# Patient Record
Sex: Female | Born: 1935 | Race: White | Hispanic: No | Smoking: Former smoker
Health system: Southern US, Community
[De-identification: ages and names within clinical notes are randomized; demographics above are authoritative.]

## PROBLEM LIST (undated history)

## (undated) DIAGNOSIS — M7989 Other specified soft tissue disorders: Secondary | ICD-10-CM

## (undated) DIAGNOSIS — E1142 Type 2 diabetes mellitus with diabetic polyneuropathy: Secondary | ICD-10-CM

## (undated) DIAGNOSIS — E7800 Pure hypercholesterolemia, unspecified: Secondary | ICD-10-CM

## (undated) DIAGNOSIS — F0390 Unspecified dementia without behavioral disturbance: Secondary | ICD-10-CM

## (undated) DIAGNOSIS — R634 Abnormal weight loss: Secondary | ICD-10-CM

## (undated) DIAGNOSIS — R0602 Shortness of breath: Secondary | ICD-10-CM

## (undated) DIAGNOSIS — E785 Hyperlipidemia, unspecified: Secondary | ICD-10-CM

## (undated) DIAGNOSIS — R062 Wheezing: Secondary | ICD-10-CM

## (undated) DIAGNOSIS — H269 Unspecified cataract: Secondary | ICD-10-CM

## (undated) DIAGNOSIS — F419 Anxiety disorder, unspecified: Secondary | ICD-10-CM

## (undated) DIAGNOSIS — M1990 Unspecified osteoarthritis, unspecified site: Secondary | ICD-10-CM

## (undated) DIAGNOSIS — K219 Gastro-esophageal reflux disease without esophagitis: Secondary | ICD-10-CM

## (undated) DIAGNOSIS — R12 Heartburn: Secondary | ICD-10-CM

## (undated) DIAGNOSIS — F329 Major depressive disorder, single episode, unspecified: Secondary | ICD-10-CM

## (undated) DIAGNOSIS — I10 Essential (primary) hypertension: Secondary | ICD-10-CM

## (undated) DIAGNOSIS — F32A Depression, unspecified: Secondary | ICD-10-CM

## (undated) DIAGNOSIS — T4145XA Adverse effect of unspecified anesthetic, initial encounter: Secondary | ICD-10-CM

## (undated) DIAGNOSIS — M797 Fibromyalgia: Secondary | ICD-10-CM

## (undated) HISTORY — DX: Depression, unspecified: F32.A

## (undated) HISTORY — PX: ABDOMINAL HYSTERECTOMY: SHX81

## (undated) HISTORY — DX: Other specified soft tissue disorders: M79.89

## (undated) HISTORY — DX: Wheezing: R06.2

## (undated) HISTORY — PX: OTHER SURGICAL HISTORY: SHX169

## (undated) HISTORY — DX: Abnormal weight loss: R63.4

## (undated) HISTORY — DX: Unspecified cataract: H26.9

---
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
## 2002-05-19 ENCOUNTER — Encounter: Payer: Self-pay | Admitting: Family Medicine

## 2002-05-19 ENCOUNTER — Ambulatory Visit (HOSPITAL_COMMUNITY): Admission: RE | Admit: 2002-05-19 | Discharge: 2002-05-19 | Payer: Self-pay | Admitting: Family Medicine

## 2002-08-02 ENCOUNTER — Encounter: Payer: Self-pay | Admitting: Family Medicine

## 2002-08-02 ENCOUNTER — Ambulatory Visit (HOSPITAL_COMMUNITY): Admission: RE | Admit: 2002-08-02 | Discharge: 2002-08-02 | Payer: Self-pay | Admitting: Family Medicine

## 2002-08-27 ENCOUNTER — Encounter: Payer: Self-pay | Admitting: Family Medicine

## 2002-08-27 ENCOUNTER — Ambulatory Visit (HOSPITAL_COMMUNITY): Admission: RE | Admit: 2002-08-27 | Discharge: 2002-08-27 | Payer: Self-pay | Admitting: Family Medicine

## 2003-02-06 ENCOUNTER — Emergency Department (HOSPITAL_COMMUNITY): Admission: EM | Admit: 2003-02-06 | Discharge: 2003-02-06 | Payer: Self-pay | Admitting: Emergency Medicine

## 2003-04-17 ENCOUNTER — Encounter: Payer: Self-pay | Admitting: *Deleted

## 2003-04-17 ENCOUNTER — Emergency Department (HOSPITAL_COMMUNITY): Admission: EM | Admit: 2003-04-17 | Discharge: 2003-04-17 | Payer: Self-pay | Admitting: *Deleted

## 2005-12-24 ENCOUNTER — Emergency Department (HOSPITAL_COMMUNITY): Admission: EM | Admit: 2005-12-24 | Discharge: 2005-12-24 | Payer: Self-pay | Admitting: Emergency Medicine

## 2007-03-20 ENCOUNTER — Emergency Department (HOSPITAL_COMMUNITY): Admission: EM | Admit: 2007-03-20 | Discharge: 2007-03-20 | Payer: Self-pay | Admitting: Emergency Medicine

## 2007-10-12 ENCOUNTER — Ambulatory Visit (HOSPITAL_COMMUNITY): Admission: RE | Admit: 2007-10-12 | Discharge: 2007-10-12 | Payer: Self-pay | Admitting: Family Medicine

## 2008-06-16 ENCOUNTER — Emergency Department (HOSPITAL_COMMUNITY): Admission: EM | Admit: 2008-06-16 | Discharge: 2008-06-16 | Payer: Self-pay | Admitting: Emergency Medicine

## 2010-06-07 ENCOUNTER — Ambulatory Visit (HOSPITAL_COMMUNITY): Admission: RE | Admit: 2010-06-07 | Discharge: 2010-06-07 | Payer: Self-pay | Admitting: Internal Medicine

## 2011-06-17 LAB — BASIC METABOLIC PANEL
BUN: 13
Calcium: 9
GFR calc non Af Amer: >60
Glucose, Bld: 136 — ABNORMAL HIGH
Potassium: 3.3 — ABNORMAL LOW
Sodium: 140

## 2011-06-17 LAB — CBC
HCT: 38.9
Hemoglobin: 13.3
Platelets: 231
RDW: 13.7
WBC: 7.2

## 2011-06-17 LAB — DIFFERENTIAL
Basophils Absolute: 0
Lymphocytes Relative: 24
Lymphs Abs: 1.8
Neutro Abs: 5

## 2011-06-17 LAB — URINALYSIS, ROUTINE W REFLEX MICROSCOPIC
Glucose, UA: NEGATIVE
Hgb urine dipstick: NEGATIVE
Leukocytes, UA: NEGATIVE
Protein, ur: 100 — AB
pH: 6

## 2011-06-17 LAB — URINE MICROSCOPIC-ADD ON

## 2011-06-27 ENCOUNTER — Other Ambulatory Visit (HOSPITAL_COMMUNITY): Payer: Self-pay | Admitting: Internal Medicine

## 2011-06-27 DIAGNOSIS — Z139 Encounter for screening, unspecified: Secondary | ICD-10-CM

## 2011-07-02 ENCOUNTER — Ambulatory Visit (HOSPITAL_COMMUNITY): Payer: Self-pay

## 2011-07-08 ENCOUNTER — Ambulatory Visit (HOSPITAL_COMMUNITY): Payer: Medicare Other

## 2011-07-30 ENCOUNTER — Ambulatory Visit (HOSPITAL_COMMUNITY)
Admission: RE | Admit: 2011-07-30 | Discharge: 2011-07-30 | Disposition: A | Discharge status: Home / Self Care | Payer: Medicare Other | Source: Ambulatory Visit | Attending: Internal Medicine | Admitting: Internal Medicine

## 2011-07-30 DIAGNOSIS — Z1231 Encounter for screening mammogram for malignant neoplasm of breast: Secondary | ICD-10-CM | POA: Insufficient documentation

## 2011-07-30 DIAGNOSIS — Z139 Encounter for screening, unspecified: Secondary | ICD-10-CM

## 2011-11-27 ENCOUNTER — Encounter (HOSPITAL_COMMUNITY): Payer: Self-pay | Admitting: Pharmacy Technician

## 2011-12-03 NOTE — Patient Instructions (Signed)
22 Laurie Mcfarland  12/03/2011   Your procedure is scheduled on:   4/9/ 2013  Report to [facility] at  830  AM.  Call this number if you have problems the morning of surgery: [phone]   Remember:   Do not eat food:After Midnight.  May have clear liquids:until Midnight .  Clear liquids include soda, tea, black coffee, apple or grape juice, broth.  Take these medicines the morning of surgery with A SIP OF WATER:  Xanax,norvasc,neurontin,prilosec   Do not wear jewelry, make-up or nail polish.  Do not wear lotions, powders, or perfumes. You may wear deodorant.  Do not shave 48 hours prior to surgery.  Do not bring valuables to the hospital.  Contacts, dentures or bridgework may not be worn into surgery.  Leave suitcase in the car. After surgery it may be brought to your room.  For patients admitted to the hospital, checkout time is 11:00 AM the day of discharge.   Patients discharged the day of surgery will not be allowed to drive home.  Name and phone number of your driver: family  Special Instructions: N/A   Please read over the following fact sheets that you were given: Pain Booklet, Surgical Site Infection Prevention, Anesthesia Post-op Instructions and Care and Recovery After Surgery Cataract A cataract is a clouding of the lens of the eye. When a lens becomes cloudy, vision is reduced based on the degree and nature of the clouding. Many cataracts reduce vision to some degree. Some cataracts make people more near-sighted as they develop. Other cataracts increase glare. Cataracts that are ignored and become worse can sometimes look white. The white color can be seen through the pupil. CAUSES   Aging. However, cataracts may occur at any age, even in newborns.   Certain drugs.   Trauma to the eye.   Certain diseases such as diabetes.   Specific eye diseases such as chronic inflammation inside the eye or a sudden attack of a rare form of glaucoma.   Inherited or acquired medical  problems.  SYMPTOMS   Gradual, progressive drop in vision in the affected eye.   Severe, rapid visual loss. This most often happens when trauma is the cause.  DIAGNOSIS  To detect a cataract, an eye doctor examines the lens. Cataracts are best diagnosed with an exam of the eyes with the pupils enlarged (dilated) by drops.  TREATMENT  For an early cataract, vision may improve by using different eyeglasses or stronger lighting. If that does not help your vision, surgery is the only effective treatment. A cataract needs to be surgically removed when vision loss interferes with your everyday activities, such as driving, reading, or watching TV. A cataract may also have to be removed if it prevents examination or treatment of another eye problem. Surgery removes the cloudy lens and usually replaces it with a substitute lens (intraocular lens, IOL).  At a time when both you and your doctor agree, the cataract will be surgically removed. If you have cataracts in both eyes, only one is usually removed at a time. This allows the operated eye to heal and be out of danger from any possible problems after surgery (such as infection or poor wound healing). In rare cases, a cataract may be doing damage to your eye. In these cases, your caregiver may advise surgical removal right away. The vast majority of people who have cataract surgery have better vision afterward. HOME CARE INSTRUCTIONS  If you are not planning surgery, you  may be asked to do the following:  Use different eyeglasses.   Use stronger or brighter lighting.   Ask your eye doctor about reducing your medicine dose or changing medicines if it is thought that a medicine caused your cataract. Changing medicines does not make the cataract go away on its own.   Become familiar with your surroundings. Poor vision can lead to injury. Avoid bumping into things on the affected side. You are at a higher risk for tripping or falling.   Exercise extreme  care when driving or operating machinery.   Wear sunglasses if you are sensitive to bright light or experiencing problems with glare.  SEEK IMMEDIATE MEDICAL CARE IF:   You have a worsening or sudden vision loss.   You notice redness, swelling, or increasing pain in the eye.   You have a fever.  Document Released: 08/19/2005 Document Revised: 08/08/2011 Document Reviewed: 04/12/2011 [facility] Patient Information 2012 [place], [place].PATIENT INSTRUCTIONS POST-ANESTHESIA  IMMEDIATELY FOLLOWING SURGERY:  Do not drive or operate machinery for the first twenty four hours after surgery.  Do not make any important decisions for twenty four hours after surgery or while taking narcotic pain medications or sedatives.  If you develop intractable nausea and vomiting or a severe headache please notify your doctor immediately.  FOLLOW-UP:  Please make an appointment with your surgeon as instructed. You do not need to follow up with anesthesia unless specifically instructed to do so.  WOUND CARE INSTRUCTIONS (if applicable):  Keep a dry clean dressing on the anesthesia/puncture wound site if there is drainage.  Once the wound has quit draining you may leave it open to air.  Generally you should leave the bandage intact for twenty four hours unless there is drainage.  If the epidural site drains for more than 36-48 hours please call the anesthesia department.  QUESTIONS?:  Please feel free to call your physician or the hospital operator if you have any questions, and they will be happy to assist you.     [facility] Anesthesia Department [address] [place] [phone]

## 2011-12-04 ENCOUNTER — Encounter (HOSPITAL_COMMUNITY): Payer: Self-pay

## 2011-12-04 ENCOUNTER — Other Ambulatory Visit: Payer: Self-pay

## 2011-12-04 ENCOUNTER — Encounter (HOSPITAL_COMMUNITY)
Admission: RE | Admit: 2011-12-04 | Discharge: 2011-12-04 | Disposition: A | Discharge status: Home / Self Care | Payer: Medicare Other | Source: Ambulatory Visit | Attending: Ophthalmology | Admitting: Ophthalmology

## 2011-12-04 HISTORY — DX: Unspecified osteoarthritis, unspecified site: M19.90

## 2011-12-04 HISTORY — DX: Shortness of breath: R06.02

## 2011-12-04 HISTORY — DX: Essential (primary) hypertension: I10

## 2011-12-04 HISTORY — DX: Gastro-esophageal reflux disease without esophagitis: K21.9

## 2011-12-04 HISTORY — DX: Hyperlipidemia, unspecified: E78.5

## 2011-12-04 HISTORY — DX: Adverse effect of unspecified anesthetic, initial encounter: T41.45XA

## 2011-12-04 HISTORY — DX: Fibromyalgia: M79.7

## 2011-12-04 HISTORY — DX: Anxiety disorder, unspecified: F41.9

## 2011-12-04 LAB — HEMOGLOBIN AND HEMATOCRIT, BLOOD
HCT: 38.2 % (ref 36.0–46.0)
Hemoglobin: 12.3 g/dL (ref 12.0–15.0)

## 2011-12-04 LAB — BASIC METABOLIC PANEL
Chloride: 100 mEq/L (ref 96–112)
GFR calc Af Amer: 79 mL/min — ABNORMAL LOW (ref >90)
GFR calc non Af Amer: 68 mL/min — ABNORMAL LOW (ref >90)
Potassium: 4.9 mEq/L (ref 3.5–5.1)
Sodium: 140 mEq/L (ref 135–145)

## 2011-12-09 MED ORDER — FLURBIPROFEN SODIUM 0.03 % OP SOLN
OPHTHALMIC | Status: AC
  Administered 2011-12-10: 1 [drp] via OPHTHALMIC
  Filled 2011-12-09: qty 2.5

## 2011-12-09 MED ORDER — CYCLOPENTOLATE-PHENYLEPHRINE 0.2-1 % OP SOLN
OPHTHALMIC | Status: AC
  Administered 2011-12-10: 1 [drp] via OPHTHALMIC
  Filled 2011-12-09: qty 2

## 2011-12-09 MED ORDER — KETOROLAC TROMETHAMINE 0.5 % OP SOLN: 1.0000 [drp] | OPHTHALMIC | Status: DC

## 2011-12-09 MED ORDER — PHENYLEPHRINE HCL 2.5 % OP SOLN
OPHTHALMIC | Status: AC
  Administered 2011-12-10: 1 [drp] via OPHTHALMIC
  Filled 2011-12-09: qty 2

## 2011-12-09 MED ORDER — TETRACAINE HCL 0.5 % OP SOLN
OPHTHALMIC | Status: AC
  Administered 2011-12-10: 1 [drp] via OPHTHALMIC
  Filled 2011-12-09: qty 2

## 2011-12-10 ENCOUNTER — Ambulatory Visit (HOSPITAL_COMMUNITY): Payer: Medicare Other | Admitting: Anesthesiology

## 2011-12-10 ENCOUNTER — Ambulatory Visit (HOSPITAL_COMMUNITY)
Admission: RE | Admit: 2011-12-10 | Discharge: 2011-12-10 | Disposition: A | Discharge status: Home / Self Care | Payer: Medicare Other | Source: Ambulatory Visit | Attending: Ophthalmology | Admitting: Ophthalmology

## 2011-12-10 ENCOUNTER — Encounter (HOSPITAL_COMMUNITY): Payer: Self-pay | Admitting: Anesthesiology

## 2011-12-10 ENCOUNTER — Encounter (HOSPITAL_COMMUNITY): Payer: Self-pay | Admitting: *Deleted

## 2011-12-10 ENCOUNTER — Encounter (HOSPITAL_COMMUNITY)
Admission: RE | Disposition: A | Discharge status: Home / Self Care | Payer: Self-pay | Source: Ambulatory Visit | Attending: Ophthalmology

## 2011-12-10 DIAGNOSIS — Z01810 Encounter for preprocedural cardiovascular examination: Secondary | ICD-10-CM | POA: Insufficient documentation

## 2011-12-10 DIAGNOSIS — E119 Type 2 diabetes mellitus without complications: Secondary | ICD-10-CM | POA: Insufficient documentation

## 2011-12-10 DIAGNOSIS — Z01812 Encounter for preprocedural laboratory examination: Secondary | ICD-10-CM | POA: Insufficient documentation

## 2011-12-10 DIAGNOSIS — H2510 Age-related nuclear cataract, unspecified eye: Secondary | ICD-10-CM | POA: Insufficient documentation

## 2011-12-10 DIAGNOSIS — Z79899 Other long term (current) drug therapy: Secondary | ICD-10-CM | POA: Insufficient documentation

## 2011-12-10 DIAGNOSIS — I10 Essential (primary) hypertension: Secondary | ICD-10-CM | POA: Insufficient documentation

## 2011-12-10 HISTORY — PX: CATARACT EXTRACTION W/PHACO: SHX586

## 2011-12-10 LAB — GLUCOSE, CAPILLARY: Glucose-Capillary: 119 mg/dL — ABNORMAL HIGH (ref 70–99)

## 2011-12-10 SURGERY — PHACOEMULSIFICATION, CATARACT, WITH IOL INSERTION
Anesthesia: Monitor Anesthesia Care | Site: Eye | Laterality: Left | Wound class: Clean

## 2011-12-10 MED ORDER — LACTATED RINGERS IV SOLN
INTRAVENOUS | Status: DC
  Administered 2011-12-10: 09:00:00 via INTRAVENOUS

## 2011-12-10 MED ORDER — TETRACAINE 0.5 % OP SOLN OPTIME - NO CHARGE
OPHTHALMIC | Status: DC | PRN
  Administered 2011-12-10: 1 [drp] via OPHTHALMIC

## 2011-12-10 MED ORDER — FENTANYL CITRATE 0.05 MG/ML IJ SOLN: 25.0000 ug | INTRAMUSCULAR | Status: DC | PRN

## 2011-12-10 MED ORDER — MIDAZOLAM HCL 2 MG/2ML IJ SOLN
1.0000 mg | INTRAMUSCULAR | Status: DC | PRN
  Administered 2011-12-10: 1 mg via INTRAVENOUS

## 2011-12-10 MED ORDER — TETRACAINE HCL 0.5 % OP SOLN
1.0000 [drp] | OPHTHALMIC | Status: AC
  Administered 2011-12-10 (×3): 1 [drp] via OPHTHALMIC

## 2011-12-10 MED ORDER — PHENYLEPHRINE HCL 2.5 % OP SOLN
1.0000 [drp] | OPHTHALMIC | Status: AC
  Administered 2011-12-10 (×3): 1 [drp] via OPHTHALMIC

## 2011-12-10 MED ORDER — EPINEPHRINE HCL 1 MG/ML IJ SOLN
INTRAMUSCULAR | Status: AC
  Filled 2011-12-10: qty 1

## 2011-12-10 MED ORDER — CYCLOPENTOLATE-PHENYLEPHRINE 0.2-1 % OP SOLN
1.0000 [drp] | OPHTHALMIC | Status: AC
  Administered 2011-12-10 (×3): 1 [drp] via OPHTHALMIC

## 2011-12-10 MED ORDER — FLURBIPROFEN SODIUM 0.03 % OP SOLN
1.0000 [drp] | OPHTHALMIC | Status: AC
  Administered 2011-12-10 (×3): 1 [drp] via OPHTHALMIC

## 2011-12-10 MED ORDER — ONDANSETRON HCL 4 MG/2ML IJ SOLN: 4.0000 mg | Freq: Once | INTRAMUSCULAR | Status: DC | PRN

## 2011-12-10 MED ORDER — EPINEPHRINE HCL 1 MG/ML IJ SOLN
INTRAOCULAR | Status: DC | PRN
  Administered 2011-12-10: 10:00:00

## 2011-12-10 MED ORDER — MIDAZOLAM HCL 2 MG/2ML IJ SOLN
INTRAMUSCULAR | Status: AC
  Administered 2011-12-10: 1 mg via INTRAVENOUS
  Filled 2011-12-10: qty 2

## 2011-12-10 MED ORDER — BSS IO SOLN
INTRAOCULAR | Status: DC | PRN
  Administered 2011-12-10: 15 mL via INTRAOCULAR

## 2011-12-10 MED ORDER — PROVISC 10 MG/ML IO SOLN
INTRAOCULAR | Status: DC | PRN
  Administered 2011-12-10: 8.5 mg via INTRAOCULAR

## 2011-12-10 SURGICAL SUPPLY — 24 items
CAPSULAR TENSION RING-AMO (OPHTHALMIC RELATED) IMPLANT
CLOTH BEACON ORANGE TIMEOUT ST (SAFETY) ×2 IMPLANT
EYE SHIELD UNIVERSAL CLEAR (GAUZE/BANDAGES/DRESSINGS) ×2 IMPLANT
GLOVE BIO SURGEON STRL SZ 6.5 (GLOVE) ×2 IMPLANT
GLOVE BIOGEL M 6.5 STRL (GLOVE) IMPLANT
GLOVE ECLIPSE 6.5 STRL STRAW (GLOVE) IMPLANT
GLOVE ECLIPSE 7.0 STRL STRAW (GLOVE) IMPLANT
GLOVE EXAM NITRILE LRG STRL (GLOVE) IMPLANT
GLOVE EXAM NITRILE MD LF STRL (GLOVE) ×2 IMPLANT
GLOVE SKINSENSE NS SZ6.5 (GLOVE)
GLOVE SKINSENSE STRL SZ6.5 (GLOVE) IMPLANT
HEALON 5 0.6 ML (INTRAOCULAR LENS) IMPLANT
KIT VITRECTOMY (OPHTHALMIC RELATED) IMPLANT
PAD ARMBOARD 7.5X6 YLW CONV (MISCELLANEOUS) ×2 IMPLANT
PROC W NO LENS (INTRAOCULAR LENS)
PROC W SPEC LENS (INTRAOCULAR LENS)
PROCESS W NO LENS (INTRAOCULAR LENS) IMPLANT
PROCESS W SPEC LENS (INTRAOCULAR LENS) IMPLANT
RING MALYGIN (MISCELLANEOUS) IMPLANT
SIGHTPATH CAT PROC W REG LENS (Ophthalmic Related) ×2 IMPLANT
TAPE SURG TRANSPORE 1 IN (GAUZE/BANDAGES/DRESSINGS) ×1 IMPLANT
TAPE SURGICAL TRANSPORE 1 IN (GAUZE/BANDAGES/DRESSINGS) ×1
VISCOELASTIC ADDITIONAL (OPHTHALMIC RELATED) IMPLANT
WATER STERILE IRR 250ML POUR (IV SOLUTION) ×2 IMPLANT

## 2011-12-10 NOTE — Op Note (Signed)
Patient brought to the operating room and prepped and draped in the usual manner.  Lid speculum inserted in left eye.  Stab incision made at the twelve o'clock position.  Provisc instilled in the anterior chamber.   A 2.4 mm. Stab incision was made temporally.  An anterior capsulotomy was done with a bent 25 gauge needle.  The nucleus was hydrodissected.  The Phaco tip was inserted in the anterior chamber and the nucleus was emulsified.  CDE was 10.72.  The cortical material was then removed with the I and A tip.  Posterior capsule was the polished.  The anterior chamber was deepened with Provisc.  A 22.5 Diopter Rayner 570C IOL was then inserted in the capsular bag.  Provisc was then removed with the I and A tip.  The wound was then hydrated.  Patient sent to the Recovery Room in good condition with follow up in my office.

## 2011-12-10 NOTE — Discharge Instructions (Signed)
Laurie Mcfarland  12/10/2011           Mollie Germany Care Instructions [address] [place] [address]      1. Avoid closing eyes tightly. One often closes the eye tightly when laughing, talking, sneezing, coughing or if they feel irritated. At these times, you should be careful not to close your eyes tightly.  2. Instill one drop Nevanac in operative eye 3 times each day until the bottle is finished. To instill drops in your eye, open it, look up and have someone gently pull the lower lid down and instill a couple of drops inside the lower lid.  3. Do not touch upper lid.  4. Take Advil or Tylenol for pain.  5. You may use either eye for near work, such as reading or sewing and you may watch television.  6. You may have your hair done at the beauty parlor at any time.  7. Wear dark glasses with or without your own glasses if you are in bright light.  8. Call our office at [phone] or [phone] if you have sharp pain in your eye or unusual symptoms.  9. Do not be concerned because vision in the operative eye is not good. It will not be good, no matter how successful the operation, until you get a special lens for it. Your old glasses will not be suited to the new eye that was operated on and you will not be ready for a new lens for about a month.  10. Follow up at the [facility] office.    I have received a copy of the above instructions and will follow them.

## 2011-12-10 NOTE — H&P (Signed)
The patient was re examined and there is no change in the patients condition since the original H and P. 

## 2011-12-10 NOTE — Transfer of Care (Signed)
Immediate Anesthesia Transfer of Care Note  Patient: Laurie Mcfarland  Procedure(s) Performed: Procedure(s) (LRB): CATARACT EXTRACTION PHACO AND INTRAOCULAR LENS PLACEMENT (IOC) (Left)  Patient Location: PACU and Short Stay  Anesthesia Type: MAC  Level of Consciousness: awake, alert  and oriented  Airway & Oxygen Therapy: Patient Spontanous Breathing  Post-op Assessment: Report given to PACU RN  Post vital signs: Reviewed and stable  Complications: No apparent anesthesia complications

## 2011-12-10 NOTE — Anesthesia Preprocedure Evaluation (Signed)
Anesthesia Evaluation  Patient identified by MRN, date of birth, ID band Patient awake    Reviewed: Allergy & Precautions, H&P , NPO status   Airway Mallampati: II      Dental  (+) Edentulous Upper   Pulmonary shortness of breath and with exertion,  breath sounds clear to auscultation        Cardiovascular hypertension, Pt. on medications Rhythm:Regular     Neuro/Psych    GI/Hepatic GERD-  Medicated and Controlled,  Endo/Other  Diabetes mellitus-, Well Controlled, Type 2, Oral Hypoglycemic AgentsMorbid obesity  Renal/GU      Musculoskeletal   Abdominal   Peds  Hematology   Anesthesia Other Findings   Reproductive/Obstetrics                           Anesthesia Physical Anesthesia Plan  ASA: III  Anesthesia Plan: MAC   Post-op Pain Management:    Induction: Intravenous  Airway Management Planned: Nasal Cannula  Additional Equipment:   Intra-op Plan:   Post-operative Plan:   Informed Consent: I have reviewed the patients History and Physical, chart, labs and discussed the procedure including the risks, benefits and alternatives for the proposed anesthesia with the patient or authorized representative who has indicated his/her understanding and acceptance.     Plan Discussed with:   Anesthesia Plan Comments:         Anesthesia Quick Evaluation  

## 2011-12-10 NOTE — Anesthesia Postprocedure Evaluation (Signed)
  Anesthesia Post-op Note  Patient: Laurie Mcfarland  Procedure(s) Performed: Procedure(s) (LRB): CATARACT EXTRACTION PHACO AND INTRAOCULAR LENS PLACEMENT (IOC) (Left)  Patient Location: PACU and Short Stay  Anesthesia Type: MAC  Level of Consciousness: awake, alert  and oriented  Airway and Oxygen Therapy: Patient Spontanous Breathing  Post-op Pain: none  Post-op Assessment: Post-op Vital signs reviewed, Patient's Cardiovascular Status Stable, Respiratory Function Stable, Patent Airway and No signs of Nausea or vomiting  Post-op Vital Signs: Reviewed and stable  Complications: No apparent anesthesia complications

## 2011-12-12 ENCOUNTER — Encounter (HOSPITAL_COMMUNITY): Payer: Self-pay | Admitting: Ophthalmology

## 2012-01-07 ENCOUNTER — Encounter (HOSPITAL_COMMUNITY): Payer: Self-pay | Admitting: Pharmacy Technician

## 2012-01-08 ENCOUNTER — Encounter (HOSPITAL_COMMUNITY): Payer: Self-pay

## 2012-01-08 ENCOUNTER — Encounter (HOSPITAL_COMMUNITY)
Admission: RE | Admit: 2012-01-08 | Discharge: 2012-01-08 | Payer: Medicare Other | Source: Ambulatory Visit | Admitting: Ophthalmology

## 2012-01-13 MED ORDER — PHENYLEPHRINE HCL 2.5 % OP SOLN
OPHTHALMIC | Status: AC
  Administered 2012-01-14: 1 [drp] via OPHTHALMIC
  Filled 2012-01-13: qty 2

## 2012-01-13 MED ORDER — CYCLOPENTOLATE-PHENYLEPHRINE 0.2-1 % OP SOLN
OPHTHALMIC | Status: AC
  Administered 2012-01-14: 1 [drp] via OPHTHALMIC
  Filled 2012-01-13: qty 2

## 2012-01-13 MED ORDER — TETRACAINE HCL 0.5 % OP SOLN
OPHTHALMIC | Status: AC
  Administered 2012-01-14: 1 [drp] via OPHTHALMIC
  Filled 2012-01-13: qty 2

## 2012-01-13 MED ORDER — FLURBIPROFEN SODIUM 0.03 % OP SOLN
OPHTHALMIC | Status: AC
  Administered 2012-01-14: 1 [drp] via OPHTHALMIC
  Filled 2012-01-13: qty 2.5

## 2012-01-14 ENCOUNTER — Encounter (HOSPITAL_COMMUNITY): Payer: Self-pay | Admitting: *Deleted

## 2012-01-14 ENCOUNTER — Ambulatory Visit (HOSPITAL_COMMUNITY)
Admission: RE | Admit: 2012-01-14 | Discharge: 2012-01-14 | Disposition: A | Discharge status: Home Health | Payer: Medicare Other | Source: Ambulatory Visit | Attending: Ophthalmology | Admitting: Ophthalmology

## 2012-01-14 ENCOUNTER — Encounter (HOSPITAL_COMMUNITY): Payer: Self-pay | Admitting: Anesthesiology

## 2012-01-14 ENCOUNTER — Encounter (HOSPITAL_COMMUNITY)
Admission: RE | Disposition: A | Discharge status: Home Health | Payer: Self-pay | Source: Ambulatory Visit | Attending: Ophthalmology

## 2012-01-14 ENCOUNTER — Ambulatory Visit (HOSPITAL_COMMUNITY): Payer: Medicare Other | Admitting: Anesthesiology

## 2012-01-14 DIAGNOSIS — Z01812 Encounter for preprocedural laboratory examination: Secondary | ICD-10-CM | POA: Insufficient documentation

## 2012-01-14 DIAGNOSIS — E119 Type 2 diabetes mellitus without complications: Secondary | ICD-10-CM | POA: Insufficient documentation

## 2012-01-14 DIAGNOSIS — Z79899 Other long term (current) drug therapy: Secondary | ICD-10-CM | POA: Insufficient documentation

## 2012-01-14 DIAGNOSIS — I10 Essential (primary) hypertension: Secondary | ICD-10-CM | POA: Insufficient documentation

## 2012-01-14 DIAGNOSIS — H2510 Age-related nuclear cataract, unspecified eye: Secondary | ICD-10-CM | POA: Insufficient documentation

## 2012-01-14 HISTORY — PX: CATARACT EXTRACTION W/PHACO: SHX586

## 2012-01-14 SURGERY — PHACOEMULSIFICATION, CATARACT, WITH IOL INSERTION
Anesthesia: Monitor Anesthesia Care | Site: Eye | Laterality: Right | Wound class: Clean

## 2012-01-14 MED ORDER — EPINEPHRINE HCL 1 MG/ML IJ SOLN
INTRAOCULAR | Status: DC | PRN
  Administered 2012-01-14: 10:00:00

## 2012-01-14 MED ORDER — FLURBIPROFEN SODIUM 0.03 % OP SOLN
1.0000 [drp] | OPHTHALMIC | Status: DC | PRN
  Administered 2012-01-14 (×3): 1 [drp] via OPHTHALMIC

## 2012-01-14 MED ORDER — TETRACAINE HCL 0.5 % OP SOLN
1.0000 [drp] | OPHTHALMIC | Status: AC
  Administered 2012-01-14 (×3): 1 [drp] via OPHTHALMIC

## 2012-01-14 MED ORDER — MIDAZOLAM HCL 2 MG/2ML IJ SOLN
1.0000 mg | INTRAMUSCULAR | Status: DC | PRN
  Administered 2012-01-14: 2 mg via INTRAVENOUS

## 2012-01-14 MED ORDER — EPINEPHRINE HCL 1 MG/ML IJ SOLN
INTRAMUSCULAR | Status: AC
  Filled 2012-01-14: qty 1

## 2012-01-14 MED ORDER — CYCLOPENTOLATE-PHENYLEPHRINE 0.2-1 % OP SOLN
1.0000 [drp] | OPHTHALMIC | Status: AC
  Administered 2012-01-14 (×3): 1 [drp] via OPHTHALMIC

## 2012-01-14 MED ORDER — TETRACAINE 0.5 % OP SOLN OPTIME - NO CHARGE
OPHTHALMIC | Status: DC | PRN
  Administered 2012-01-14: 1 [drp] via OPHTHALMIC

## 2012-01-14 MED ORDER — MIDAZOLAM HCL 2 MG/2ML IJ SOLN
INTRAMUSCULAR | Status: AC
  Administered 2012-01-14: 2 mg via INTRAVENOUS
  Filled 2012-01-14: qty 2

## 2012-01-14 MED ORDER — LACTATED RINGERS IV SOLN
INTRAVENOUS | Status: DC
  Administered 2012-01-14: 1000 mL via INTRAVENOUS

## 2012-01-14 MED ORDER — BSS IO SOLN
INTRAOCULAR | Status: DC | PRN
  Administered 2012-01-14: 15 mL via INTRAOCULAR

## 2012-01-14 MED ORDER — PHENYLEPHRINE HCL 2.5 % OP SOLN
1.0000 [drp] | OPHTHALMIC | Status: AC
  Administered 2012-01-14 (×3): 1 [drp] via OPHTHALMIC

## 2012-01-14 MED ORDER — PROVISC 10 MG/ML IO SOLN
INTRAOCULAR | Status: DC | PRN
  Administered 2012-01-14: .85 mL via INTRAOCULAR

## 2012-01-14 SURGICAL SUPPLY — 23 items
CAPSULAR TENSION RING-AMO (OPHTHALMIC RELATED) IMPLANT
CLOTH BEACON ORANGE TIMEOUT ST (SAFETY) ×2 IMPLANT
EYE SHIELD UNIVERSAL CLEAR (GAUZE/BANDAGES/DRESSINGS) ×2 IMPLANT
GLOVE BIO SURGEON STRL SZ 6.5 (GLOVE) ×2 IMPLANT
GLOVE ECLIPSE 6.5 STRL STRAW (GLOVE) IMPLANT
GLOVE ECLIPSE 7.0 STRL STRAW (GLOVE) IMPLANT
GLOVE EXAM NITRILE LRG STRL (GLOVE) IMPLANT
GLOVE EXAM NITRILE MD LF STRL (GLOVE) ×2 IMPLANT
GLOVE SKINSENSE NS SZ6.5 (GLOVE)
GLOVE SKINSENSE STRL SZ6.5 (GLOVE) IMPLANT
HEALON 5 0.6 ML (INTRAOCULAR LENS) IMPLANT
KIT VITRECTOMY (OPHTHALMIC RELATED) IMPLANT
PAD ARMBOARD 7.5X6 YLW CONV (MISCELLANEOUS) ×2 IMPLANT
PROC W NO LENS (INTRAOCULAR LENS)
PROC W SPEC LENS (INTRAOCULAR LENS)
PROCESS W NO LENS (INTRAOCULAR LENS) IMPLANT
PROCESS W SPEC LENS (INTRAOCULAR LENS) IMPLANT
RING MALYGIN (MISCELLANEOUS) IMPLANT
SIGHTPATH CAT PROC W REG LENS (Ophthalmic Related) ×2 IMPLANT
TAPE SURG TRANSPORE 1 IN (GAUZE/BANDAGES/DRESSINGS) ×1 IMPLANT
TAPE SURGICAL TRANSPORE 1 IN (GAUZE/BANDAGES/DRESSINGS) ×1
VISCOELASTIC ADDITIONAL (OPHTHALMIC RELATED) IMPLANT
WATER STERILE IRR 250ML POUR (IV SOLUTION) ×2 IMPLANT

## 2012-01-14 NOTE — Anesthesia Preprocedure Evaluation (Signed)
Anesthesia Evaluation  Patient identified by MRN, date of birth, ID band Patient awake    Reviewed: Allergy & Precautions, H&P , NPO status   Airway Mallampati: II      Dental  (+) Edentulous Upper   Pulmonary shortness of breath and with exertion,  breath sounds clear to auscultation        Cardiovascular hypertension, Pt. on medications Rhythm:Regular     Neuro/Psych    GI/Hepatic GERD-  Medicated and Controlled,  Endo/Other  Diabetes mellitus-, Well Controlled, Type 2, Oral Hypoglycemic AgentsMorbid obesity  Renal/GU      Musculoskeletal   Abdominal   Peds  Hematology   Anesthesia Other Findings   Reproductive/Obstetrics                           Anesthesia Physical Anesthesia Plan  ASA: III  Anesthesia Plan: MAC   Post-op Pain Management:    Induction: Intravenous  Airway Management Planned: Nasal Cannula  Additional Equipment:   Intra-op Plan:   Post-operative Plan:   Informed Consent: I have reviewed the patients History and Physical, chart, labs and discussed the procedure including the risks, benefits and alternatives for the proposed anesthesia with the patient or authorized representative who has indicated his/her understanding and acceptance.     Plan Discussed with:   Anesthesia Plan Comments:         Anesthesia Quick Evaluation

## 2012-01-14 NOTE — Anesthesia Postprocedure Evaluation (Signed)
  Anesthesia Post-op Note  Patient: Laurie Mcfarland  Procedure(s) Performed: Procedure(s) (LRB): CATARACT EXTRACTION PHACO AND INTRAOCULAR LENS PLACEMENT (IOC) (Right)  Patient Location: PACU and Short Stay  Anesthesia Type: MAC  Level of Consciousness: awake, alert , oriented and patient cooperative  Airway and Oxygen Therapy: Patient Spontanous Breathing  Post-op Pain: none  Post-op Assessment: Post-op Vital signs reviewed, Patient's Cardiovascular Status Stable, Respiratory Function Stable, Patent Airway, No signs of Nausea or vomiting and Pain level controlled  Post-op Vital Signs: Reviewed and stable  Complications: No apparent anesthesia complications

## 2012-01-14 NOTE — Transfer of Care (Signed)
Immediate Anesthesia Transfer of Care Note  Patient: Laurie Mcfarland  Procedure(s) Performed: Procedure(s) (LRB): CATARACT EXTRACTION PHACO AND INTRAOCULAR LENS PLACEMENT (IOC) (Right)  Patient Location: PACU and Short Stay  Anesthesia Type: MAC  Level of Consciousness: awake, alert , oriented and patient cooperative  Airway & Oxygen Therapy: Patient Spontanous Breathing  Post-op Assessment: Report given to PACU RN, Post -op Vital signs reviewed and stable and Patient moving all extremities  Post vital signs: Reviewed and stable  Complications: No apparent anesthesia complications

## 2012-01-14 NOTE — Discharge Instructions (Signed)
JADEYN HARGETT  01/14/2012           Mollie Germany Care Instructions [address] [place] [address]      1. Avoid closing eyes tightly. One often closes the eye tightly when laughing, talking, sneezing, coughing or if they feel irritated. At these times, you should be careful not to close your eyes tightly.  2. Instill one drop Nevanac in operative eye 3 times each day until the bottle is finished. To instill drops in your eye, open it, look up and have someone gently pull the lower lid down and instill a couple of drops inside the lower lid.  3. Do not touch upper lid.  4. Take Advil or Tylenol for pain.  5. You may use either eye for near work, such as reading or sewing and you may watch television.  6. You may have your hair done at the beauty parlor at any time.  7. Wear dark glasses with or without your own glasses if you are in bright light.  8. Call our office at [phone] or [phone] if you have sharp pain in your eye or unusual symptoms.  9. Do not be concerned because vision in the operative eye is not good. It will not be good, no matter how successful the operation, until you get a special lens for it. Your old glasses will not be suited to the new eye that was operated on and you will not be ready for a new lens for about a month.  10. Follow up at the [facility] office.    I have received a copy of the above instructions and will follow them.

## 2012-01-14 NOTE — H&P (Signed)
The patient was re examined and there is no change in the patients condition since the original H and P. 

## 2012-01-14 NOTE — Op Note (Signed)
Patient brought to the operating room and prepped and draped in the usual manner.  Lid speculum inserted in right eye.  Stab incision made at the twelve o'clock position.  Provisc instilled in the anterior chamber.   A 2.4 mm. Stab incision was made temporally.  An anterior capsulotomy was done with a bent 25 gauge needle.  The nucleus was hydrodissected.  The Phaco tip was inserted in the anterior chamber and the nucleus was emulsified.  CDE was 11.63.  The cortical material was then removed with the I and A tip.  Posterior capsule was the polished.  The anterior chamber was deepened with Provisc.  A 23.0 Diopter Rayner 570C IOL was then inserted in the capsular bag.  Provisc was then removed with the I and A tip.  The wound was then hydrated.  Patient sent to the Recovery Room in good condition with follow up in my office.

## 2012-01-17 ENCOUNTER — Encounter (HOSPITAL_COMMUNITY): Payer: Self-pay | Admitting: Ophthalmology

## 2012-02-04 ENCOUNTER — Other Ambulatory Visit (HOSPITAL_COMMUNITY): Payer: Self-pay | Admitting: Internal Medicine

## 2012-02-04 DIAGNOSIS — Z139 Encounter for screening, unspecified: Secondary | ICD-10-CM

## 2012-02-04 DIAGNOSIS — E119 Type 2 diabetes mellitus without complications: Secondary | ICD-10-CM

## 2012-06-09 ENCOUNTER — Other Ambulatory Visit (HOSPITAL_COMMUNITY): Payer: Medicare Other

## 2012-08-17 ENCOUNTER — Ambulatory Visit (HOSPITAL_COMMUNITY)
Admission: RE | Admit: 2012-08-17 | Discharge: 2012-08-17 | Disposition: A | Discharge status: Home / Self Care | Payer: Medicare Other | Source: Ambulatory Visit | Attending: Internal Medicine | Admitting: Internal Medicine

## 2012-08-17 DIAGNOSIS — Z13820 Encounter for screening for osteoporosis: Secondary | ICD-10-CM | POA: Insufficient documentation

## 2012-08-17 DIAGNOSIS — E119 Type 2 diabetes mellitus without complications: Secondary | ICD-10-CM

## 2012-08-17 DIAGNOSIS — Z139 Encounter for screening, unspecified: Secondary | ICD-10-CM

## 2012-12-02 ENCOUNTER — Other Ambulatory Visit (HOSPITAL_COMMUNITY): Payer: Self-pay | Admitting: Internal Medicine

## 2012-12-02 DIAGNOSIS — Z139 Encounter for screening, unspecified: Secondary | ICD-10-CM

## 2012-12-03 ENCOUNTER — Ambulatory Visit (HOSPITAL_COMMUNITY)
Admission: RE | Admit: 2012-12-03 | Discharge: 2012-12-03 | Disposition: A | Discharge status: Home / Self Care | Payer: Medicare Other | Source: Ambulatory Visit | Attending: Internal Medicine | Admitting: Internal Medicine

## 2012-12-03 DIAGNOSIS — Z139 Encounter for screening, unspecified: Secondary | ICD-10-CM

## 2012-12-03 DIAGNOSIS — Z1231 Encounter for screening mammogram for malignant neoplasm of breast: Secondary | ICD-10-CM | POA: Insufficient documentation

## 2013-03-30 ENCOUNTER — Ambulatory Visit (HOSPITAL_COMMUNITY)
Admission: RE | Admit: 2013-03-30 | Discharge: 2013-03-30 | Disposition: A | Discharge status: Home / Self Care | Payer: Medicare Other | Source: Ambulatory Visit | Attending: Internal Medicine | Admitting: Internal Medicine

## 2013-03-30 ENCOUNTER — Other Ambulatory Visit (HOSPITAL_COMMUNITY): Payer: Self-pay | Admitting: Internal Medicine

## 2013-03-30 DIAGNOSIS — R0602 Shortness of breath: Secondary | ICD-10-CM

## 2013-04-02 ENCOUNTER — Other Ambulatory Visit (HOSPITAL_COMMUNITY): Payer: Self-pay | Admitting: Internal Medicine

## 2013-04-02 DIAGNOSIS — M79604 Pain in right leg: Secondary | ICD-10-CM

## 2013-04-02 DIAGNOSIS — R609 Edema, unspecified: Secondary | ICD-10-CM

## 2013-04-05 ENCOUNTER — Ambulatory Visit (HOSPITAL_COMMUNITY): Payer: Medicare Other | Attending: Internal Medicine

## 2013-05-10 ENCOUNTER — Other Ambulatory Visit (HOSPITAL_COMMUNITY): Payer: Self-pay | Admitting: Internal Medicine

## 2013-05-10 DIAGNOSIS — R1310 Dysphagia, unspecified: Secondary | ICD-10-CM

## 2013-05-13 ENCOUNTER — Ambulatory Visit (HOSPITAL_COMMUNITY)
Admission: RE | Admit: 2013-05-13 | Discharge: 2013-05-13 | Disposition: A | Discharge status: Home / Self Care | Payer: Medicare Other | Source: Ambulatory Visit | Attending: Internal Medicine | Admitting: Internal Medicine

## 2013-05-13 DIAGNOSIS — K224 Dyskinesia of esophagus: Secondary | ICD-10-CM | POA: Insufficient documentation

## 2013-05-13 DIAGNOSIS — R1310 Dysphagia, unspecified: Secondary | ICD-10-CM | POA: Insufficient documentation

## 2013-05-19 ENCOUNTER — Encounter: Payer: Self-pay | Admitting: Gastroenterology

## 2013-06-09 ENCOUNTER — Encounter: Payer: Self-pay | Admitting: Gastroenterology

## 2013-06-09 ENCOUNTER — Ambulatory Visit (INDEPENDENT_AMBULATORY_CARE_PROVIDER_SITE_OTHER): Payer: Medicare Other | Admitting: Gastroenterology

## 2013-06-09 ENCOUNTER — Encounter (INDEPENDENT_AMBULATORY_CARE_PROVIDER_SITE_OTHER): Payer: Self-pay

## 2013-06-09 VITALS — BP 154/83 | HR 89 | Temp 97.4°F | Ht 63.0 in | Wt 217.8 lb

## 2013-06-09 DIAGNOSIS — R1310 Dysphagia, unspecified: Secondary | ICD-10-CM | POA: Insufficient documentation

## 2013-06-09 DIAGNOSIS — K219 Gastro-esophageal reflux disease without esophagitis: Secondary | ICD-10-CM | POA: Insufficient documentation

## 2013-06-09 DIAGNOSIS — R6881 Early satiety: Secondary | ICD-10-CM

## 2013-06-09 DIAGNOSIS — R1314 Dysphagia, pharyngoesophageal phase: Secondary | ICD-10-CM

## 2013-06-09 NOTE — Progress Notes (Signed)
Primary Care Physician:  Cassell Smiles., MD  Primary Gastroenterologist:  Roetta Sessions, MD   Chief Complaint  Patient presents with  . Dysphagia    HPI:  Laurie Mcfarland is a 78 y.o. female here for further evaluation of difficulty swallowing.  Patient is a long history of gastroesophageal reflux disease. She has been on chronic PPI for more than 15 years. Placed on PPI for acid reflux, gurgling after meals. No prior upper endoscopy. Recent barium esophagram showed diffuse moderate age-related impairment of esophageal motility. Denies unintentional weight loss. No constipation. BM regular to loose (related to certain foods). No melena, brbpr. No abdominal pain. No nocturnal BMs. Denies heartburn symptoms. Complains of difficulty swallowing food. Particularly has trouble swallowing meat. Feels like throat wants to stay raw. Pills go down okay. Takes 4-5 at a time. Complains of early satiety. Sometimes takes Advil as well.  No prior colonoscopy.   Current Outpatient Prescriptions  Medication Sig Dispense Refill  . ALPRAZolam (XANAX) 1 MG tablet Take 1 mg by mouth 4 (four) times daily as needed. Nerves      . amLODipine (NORVASC) 10 MG tablet Take 10 mg by mouth daily.       Marland Kitchen aspirin EC 81 MG tablet Take 81 mg by mouth every morning.       . fish oil-omega-3 fatty acids 1000 MG capsule Take 1 g by mouth 3 (three) times daily.       . furosemide (LASIX) 20 MG tablet Take 20 mg by mouth daily.       Marland Kitchen gabapentin (NEURONTIN) 100 MG capsule Take 100 mg by mouth 3 (three) times daily.       Marland Kitchen HYDROcodone-acetaminophen (LORCET) 10-650 MG per tablet Take 1 tablet by mouth every 6 (six) hours as needed. Pain      . meloxicam (MOBIC) 7.5 MG tablet Take 7.5 mg by mouth every morning.       . metFORMIN (GLUCOPHAGE) 500 MG tablet Take 500 mg by mouth every morning.       . mirtazapine (REMERON) 15 MG tablet Take 15 mg by mouth at bedtime.      . Misc Natural Products (OSTEO BI-FLEX ADV DOUBLE ST PO)  Take 1 tablet by mouth every morning.       . multivitamin-lutein (OCUVITE-LUTEIN) CAPS Take 1 capsule by mouth every morning.       Marland Kitchen omeprazole (PRILOSEC) 40 MG capsule Take 40 mg by mouth daily.      . potassium chloride SA (K-DUR,KLOR-CON) 20 MEQ tablet Take 20 mEq by mouth every morning.       . Probiotic Product (TRUBIOTICS) CAPS Take 1 capsule by mouth every morning.       . simvastatin (ZOCOR) 20 MG tablet Take 20 mg by mouth every evening.      . vitamin C (ASCORBIC ACID) 500 MG tablet Take 500 mg by mouth every morning.        No current facility-administered medications for this visit.    Allergies as of 06/09/2013 - Review Complete 06/09/2013  Allergen Reaction Noted  . Penicillins Rash 06/09/2013    Past Medical History  Diagnosis Date  . Hypertension   . Shortness of breath     with exertion  . Diabetes mellitus   . GERD (gastroesophageal reflux disease)   . Anxiety   . Arthritis   . Hyperlipidemia   . Fibromyalgia   . Complication of anesthesia     pt. states that she remembers parts of  her surgery when she had her TAH    Past Surgical History  Procedure Laterality Date  . Abdominal hysterectomy    . Foreign body removal      splinter removed from right side of face  . Cataract extraction w/phaco  12/10/2011    Procedure: CATARACT EXTRACTION PHACO AND INTRAOCULAR LENS PLACEMENT (IOC);  Surgeon: Loraine Leriche T. Nile Riggs, MD;  Location: [facility];  Service: Ophthalmology;  Laterality: Left;  CDE=10.72  . Cataract extraction w/phaco  01/14/2012    Procedure: CATARACT EXTRACTION PHACO AND INTRAOCULAR LENS PLACEMENT (IOC);  Surgeon: Loraine Leriche T. Nile Riggs, MD;  Location: [facility];  Service: Ophthalmology;  Laterality: Right;  CDE:11.63    Family History  Problem Relation Age of Onset  . Pseudochol deficiency Neg Hx   . Malignant hyperthermia Neg Hx   . Hypotension Neg Hx   . Anesthesia problems Neg Hx   . Colon cancer Neg Hx     History   Social History  . Marital Status:  Widowed    Spouse Name: N/A    Number of Children: 4  . Years of Education: N/A   Occupational History  . Not on file.   Social History Main Topics  . Smoking status: Never Smoker   . Smokeless tobacco: Not on file  . Alcohol Use: No  . Drug Use: No  . Sexual Activity: Not on file   Other Topics Concern  . Not on file   Social History Narrative   Adopted granddaughter and raised great grandson.      ROS:  General: Negative for anorexia, weight loss, fever, chills, fatigue, weakness. Eyes: Negative for vision changes.  ENT: Negative for hoarseness, difficulty swallowing , nasal congestion. CV: Negative for chest pain, angina, palpitations, dyspnea on exertion, peripheral edema.  Respiratory: Negative for dyspnea at rest, dyspnea on exertion, cough, sputum, wheezing.  GI: See history of present illness. GU:  Negative for dysuria, hematuria, urinary incontinence, urinary frequency, nocturnal urination.  MS: Negative for joint pain, low back pain.  Derm: Negative for rash or itching.  Neuro: Negative for weakness, abnormal sensation, seizure, frequent headaches, memory loss, confusion.  Psych: Negative for anxiety, depression, suicidal ideation, hallucinations.  Endo: Negative for unusual weight change.  Heme: Negative for bruising or bleeding. Allergy: Negative for rash or hives.    Physical Examination:  BP 154/83  Pulse 89  Temp(Src) 97.4 F (36.3 C) (Oral)  Ht 5\' 3"  (1.6 m)  Wt 217 lb 12.8 oz (98.793 kg)  BMI 38.59 kg/m2   General: Well-nourished, well-developed in no acute distress.  Head: Normocephalic, atraumatic.   Eyes: Conjunctiva pink, no icterus. Mouth: Oropharyngeal mucosa moist and pink , no lesions erythema or exudate. Neck: Supple without thyromegaly, masses, or lymphadenopathy.  Lungs: Clear to auscultation bilaterally.  Heart: Regular rate and rhythm, no murmurs rubs or gallops.  Abdomen: Bowel sounds are normal, nontender, nondistended, no  hepatosplenomegaly or masses, no abdominal bruits or hernia , no rebound or guarding.   Rectal: not performed Extremities: No lower extremity edema. No clubbing or deformities.  Neuro: Alert and oriented x 4 , grossly normal neurologically.  Skin: Warm and dry, no rash or jaundice.   Psych: Alert and cooperative, normal mood and affect.  Labs: Labs from 02/19/2013. Sodium 140, potassium 4.2, glucose 108, BUN 17, creatinine 1.04, total bilirubin 0.3, alkaline phosphatase 72, AST 25, ALT 18, albumin 4.6, white blood cell count 7300, hemoglobin 12, hematocrit 36.5, MCV 85.7, platelets 252,000.  Imaging Studies: Dg Esophagus  05/13/2013   CLINICAL DATA:  Cervical dysphagia, feels like foods and liquids are getting stuck in the upper chest/lower throat area  EXAM: ESOPHOGRAM/BARIUM SWALLOW  TECHNIQUE: Combined double contrast and single contrast examination performed using effervescent crystals, thick barium liquid, and thin barium liquid.  COMPARISON:  None  FLUOROSCOPY TIME:  2 min 0 seconds  FINDINGS: Normal esophageal distention.  No esophageal mass or stricture.  12.5 mm diameter barium tablet passes were oral cavity to stomach without delay.  Diffuse moderate age-related impairment of esophageal motility.  Incomplete clearance of barium by primary peristaltic waves with numerous secondary and tertiary waves noted as well as prolonged thoracic retention of contrast.  Smooth appearance of esophageal mucosa on air contrast imaging without irregularity or ulceration.  No persistent intraluminal filling defects.  Targeted rapid sequence imaging of the cervical esophagus and hypopharynx demonstrates no laryngeal penetration or aspiration of contrast.  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  No evidence of esophageal mass or obstruction.   Electronically Signed   By: Ulyses Southward M.D.   On: 05/13/2013 18:09

## 2013-06-09 NOTE — Patient Instructions (Signed)
1. We have scheduled you for an upper endoscopy with Dr. Jena Gauss. Please see separate instructions. 2. Please consider having a colonoscopy in the near future. Call us if you decide you want one. 3. For your swallowing problems, be sure to chew your food thoroughly. Use plenty of liquid to wash her food down. Make sure use and upright for 30 minutes after taking her pills and at least 3 hours after eating.

## 2013-06-10 NOTE — Assessment & Plan Note (Signed)
77 year old lady who presents with chronic GERD, solid food esophageal dysphagia, early satiety. No prior upper endoscopy. Recent esophagram demonstrates age-related dysmotility. Discussed at length with patient and grandson today. She needs an upper endoscopy to rule out Barrett's esophagus. For early satiety we need to exclude peptic ulcer disease/gastritis. She may benefit from empiric esophageal dilation but given age-related dysmotility she may have chronic component of dysphagia. Plan for EGD with dilation in the near future. Due to polypharmacy, we will augment conscious sedation with Phenergan 25 mg IV 30 minutes before the procedure.  I have discussed the risks, alternatives, benefits with regards to but not limited to the risk of reaction to medication, bleeding, infection, perforation and the patient is agreeable to proceed. Written consent to be obtained.  Patient has never had colonoscopy. She refuses at this time, wants to concentrate on her dysphagia.

## 2013-06-14 LAB — COMPREHENSIVE METABOLIC PANEL
ALT: 18 U/L (ref 7–35)
Albumin: 4.6
Calcium: 9.4 mg/dL
Glucose: 108
Potassium: 4.2 mmol/L
Sodium: 140 mmol/L (ref 137–147)
Total Protein: 7.8 g/dL

## 2013-06-14 LAB — CBC
WBC: 7.3
platelet count: 252

## 2013-06-14 NOTE — Progress Notes (Signed)
cc'd to pcp 

## 2013-06-15 ENCOUNTER — Encounter (HOSPITAL_COMMUNITY): Payer: Self-pay | Admitting: Pharmacy Technician

## 2013-06-16 MED ORDER — PROMETHAZINE HCL 25 MG/ML IJ SOLN
25.0000 mg | Freq: Once | INTRAMUSCULAR | Status: DC
  Filled 2013-06-16: qty 1

## 2013-06-16 NOTE — OR Nursing (Signed)
Tana Coast PA called back and said to change order to Phenergan 12.5mg  IV. Order changed.

## 2013-06-16 NOTE — OR Nursing (Signed)
Notified Tana Coast PA regarding Phenergan 25mg  IV prior to procedure. Said to proceed with giving the patient Phenergan 25mg  IV as ordered.

## 2013-06-17 ENCOUNTER — Encounter (HOSPITAL_COMMUNITY): Payer: Self-pay

## 2013-06-17 ENCOUNTER — Ambulatory Visit (HOSPITAL_COMMUNITY)
Admission: RE | Admit: 2013-06-17 | Discharge: 2013-06-17 | Disposition: A | Discharge status: Home / Self Care | Payer: Medicare Other | Source: Ambulatory Visit | Attending: Internal Medicine | Admitting: Internal Medicine

## 2013-06-17 ENCOUNTER — Encounter (HOSPITAL_COMMUNITY)
Admission: RE | Disposition: A | Discharge status: Home / Self Care | Payer: Self-pay | Source: Ambulatory Visit | Attending: Internal Medicine

## 2013-06-17 DIAGNOSIS — K2960 Other gastritis without bleeding: Secondary | ICD-10-CM

## 2013-06-17 DIAGNOSIS — R1310 Dysphagia, unspecified: Secondary | ICD-10-CM | POA: Insufficient documentation

## 2013-06-17 DIAGNOSIS — R6881 Early satiety: Secondary | ICD-10-CM

## 2013-06-17 DIAGNOSIS — E119 Type 2 diabetes mellitus without complications: Secondary | ICD-10-CM | POA: Insufficient documentation

## 2013-06-17 DIAGNOSIS — K219 Gastro-esophageal reflux disease without esophagitis: Secondary | ICD-10-CM

## 2013-06-17 DIAGNOSIS — I10 Essential (primary) hypertension: Secondary | ICD-10-CM | POA: Insufficient documentation

## 2013-06-17 DIAGNOSIS — K449 Diaphragmatic hernia without obstruction or gangrene: Secondary | ICD-10-CM

## 2013-06-17 HISTORY — PX: ESOPHAGOGASTRODUODENOSCOPY (EGD) WITH ESOPHAGEAL DILATION: SHX5812

## 2013-06-17 SURGERY — ESOPHAGOGASTRODUODENOSCOPY (EGD) WITH ESOPHAGEAL DILATION
Anesthesia: Moderate Sedation

## 2013-06-17 MED ORDER — MIDAZOLAM HCL 5 MG/5ML IJ SOLN
INTRAMUSCULAR | Status: DC | PRN
  Administered 2013-06-17: 2 mg via INTRAVENOUS
  Administered 2013-06-17: 1 mg via INTRAVENOUS

## 2013-06-17 MED ORDER — MEPERIDINE HCL 100 MG/ML IJ SOLN
INTRAMUSCULAR | Status: DC | PRN
  Administered 2013-06-17: 50 mg via INTRAVENOUS
  Administered 2013-06-17: 25 mg via INTRAVENOUS

## 2013-06-17 MED ORDER — MEPERIDINE HCL 100 MG/ML IJ SOLN
INTRAMUSCULAR | Status: AC
  Filled 2013-06-17: qty 2

## 2013-06-17 MED ORDER — STERILE WATER FOR IRRIGATION IR SOLN
Status: DC | PRN
  Administered 2013-06-17: 11:00:00

## 2013-06-17 MED ORDER — MIDAZOLAM HCL 5 MG/5ML IJ SOLN
INTRAMUSCULAR | Status: AC
  Filled 2013-06-17: qty 10

## 2013-06-17 MED ORDER — SODIUM CHLORIDE 0.9 % IV SOLN: INTRAVENOUS | Status: DC

## 2013-06-17 MED ORDER — ONDANSETRON HCL 4 MG/2ML IJ SOLN
INTRAMUSCULAR | Status: DC | PRN
  Administered 2013-06-17: 4 mg via INTRAVENOUS

## 2013-06-17 MED ORDER — ONDANSETRON HCL 4 MG/2ML IJ SOLN
INTRAMUSCULAR | Status: AC
  Filled 2013-06-17: qty 2

## 2013-06-17 MED ORDER — BUTAMBEN-TETRACAINE-BENZOCAINE 2-2-14 % EX AERO
INHALATION_SPRAY | CUTANEOUS | Status: DC | PRN
  Administered 2013-06-17: 2 via TOPICAL

## 2013-06-17 MED ORDER — PROMETHAZINE HCL 25 MG/ML IJ SOLN
12.5000 mg | Freq: Once | INTRAMUSCULAR | Status: AC
  Administered 2013-06-17: 12.5 mg via INTRAVENOUS

## 2013-06-17 MED ORDER — SODIUM CHLORIDE 0.9 % IJ SOLN
INTRAMUSCULAR | Status: AC
  Filled 2013-06-17: qty 10

## 2013-06-17 NOTE — Interval H&P Note (Signed)
History and Physical Interval Note:  06/17/2013 11:09 AM  Laurie Mcfarland  has presented today for surgery, with the diagnosis of Chronic GERD, Esophageal Dysphagia, Early Satiety  The various methods of treatment have been discussed with the patient and family. After consideration of risks, benefits and other options for treatment, the patient has consented to  Procedure(s) with comments: ESOPHAGOGASTRODUODENOSCOPY (EGD) WITH ESOPHAGEAL DILATION (N/A) - 11:15 as a surgical intervention .  The patient's history has been reviewed, patient examined, no change in status, stable for surgery.  I have reviewed the patient's chart and labs.  Questions were answered to the patient's satisfaction.     EGD with esophageal dilation as feasible per plan.  The risks, benefits, limitations, alternatives and imponderables have been reviewed with the patient. Potential for esophageal dilation, biopsy, etc. have also been reviewed.  Questions have been answered. All parties agreeable.  Eula Listen

## 2013-06-17 NOTE — H&P (View-Only) (Signed)
Primary Care Physician:  FUSCO,LAWRENCE J., MD  Primary Gastroenterologist:  Michael Rourk, MD   Chief Complaint  Patient presents with  . Dysphagia    HPI:  Laurie Mcfarland is a 78 y.o. female here for further evaluation of difficulty swallowing.  Patient is a long history of gastroesophageal reflux disease. She has been on chronic PPI for more than 15 years. Placed on PPI for acid reflux, gurgling after meals. No prior upper endoscopy. Recent barium esophagram showed diffuse moderate age-related impairment of esophageal motility. Denies unintentional weight loss. No constipation. BM regular to loose (related to certain foods). No melena, brbpr. No abdominal pain. No nocturnal BMs. Denies heartburn symptoms. Complains of difficulty swallowing food. Particularly has trouble swallowing meat. Feels like throat wants to stay raw. Pills go down okay. Takes 4-5 at a time. Complains of early satiety. Sometimes takes Advil as well.  No prior colonoscopy.   Current Outpatient Prescriptions  Medication Sig Dispense Refill  . ALPRAZolam (XANAX) 1 MG tablet Take 1 mg by mouth 4 (four) times daily as needed. Nerves      . amLODipine (NORVASC) 10 MG tablet Take 10 mg by mouth daily.       . aspirin EC 81 MG tablet Take 81 mg by mouth every morning.       . fish oil-omega-3 fatty acids 1000 MG capsule Take 1 g by mouth 3 (three) times daily.       . furosemide (LASIX) 20 MG tablet Take 20 mg by mouth daily.       . gabapentin (NEURONTIN) 100 MG capsule Take 100 mg by mouth 3 (three) times daily.       . HYDROcodone-acetaminophen (LORCET) 10-650 MG per tablet Take 1 tablet by mouth every 6 (six) hours as needed. Pain      . meloxicam (MOBIC) 7.5 MG tablet Take 7.5 mg by mouth every morning.       . metFORMIN (GLUCOPHAGE) 500 MG tablet Take 500 mg by mouth every morning.       . mirtazapine (REMERON) 15 MG tablet Take 15 mg by mouth at bedtime.      . Misc Natural Products (OSTEO BI-FLEX ADV DOUBLE ST PO)  Take 1 tablet by mouth every morning.       . multivitamin-lutein (OCUVITE-LUTEIN) CAPS Take 1 capsule by mouth every morning.       . omeprazole (PRILOSEC) 40 MG capsule Take 40 mg by mouth daily.      . potassium chloride SA (K-DUR,KLOR-CON) 20 MEQ tablet Take 20 mEq by mouth every morning.       . Probiotic Product (TRUBIOTICS) CAPS Take 1 capsule by mouth every morning.       . simvastatin (ZOCOR) 20 MG tablet Take 20 mg by mouth every evening.      . vitamin C (ASCORBIC ACID) 500 MG tablet Take 500 mg by mouth every morning.        No current facility-administered medications for this visit.    Allergies as of 06/09/2013 - Review Complete 06/09/2013  Allergen Reaction Noted  . Penicillins Rash 06/09/2013    Past Medical History  Diagnosis Date  . Hypertension   . Shortness of breath     with exertion  . Diabetes mellitus   . GERD (gastroesophageal reflux disease)   . Anxiety   . Arthritis   . Hyperlipidemia   . Fibromyalgia   . Complication of anesthesia     pt. states that she remembers parts of   her surgery when she had her TAH    Past Surgical History  Procedure Laterality Date  . Abdominal hysterectomy    . Foreign body removal      splinter removed from right side of face  . Cataract extraction w/phaco  12/10/2011    Procedure: CATARACT EXTRACTION PHACO AND INTRAOCULAR LENS PLACEMENT (IOC);  Surgeon: Mark T. Shapiro, MD;  Location: [facility];  Service: Ophthalmology;  Laterality: Left;  CDE=10.72  . Cataract extraction w/phaco  01/14/2012    Procedure: CATARACT EXTRACTION PHACO AND INTRAOCULAR LENS PLACEMENT (IOC);  Surgeon: Mark T. Shapiro, MD;  Location: [facility];  Service: Ophthalmology;  Laterality: Right;  CDE:11.63    Family History  Problem Relation Age of Onset  . Pseudochol deficiency Neg Hx   . Malignant hyperthermia Neg Hx   . Hypotension Neg Hx   . Anesthesia problems Neg Hx   . Colon cancer Neg Hx     History   Social History  . Marital Status:  Widowed    Spouse Name: N/A    Number of Children: 4  . Years of Education: N/A   Occupational History  . Not on file.   Social History Main Topics  . Smoking status: Never Smoker   . Smokeless tobacco: Not on file  . Alcohol Use: No  . Drug Use: No  . Sexual Activity: Not on file   Other Topics Concern  . Not on file   Social History Narrative   Adopted granddaughter and raised great grandson.      ROS:  General: Negative for anorexia, weight loss, fever, chills, fatigue, weakness. Eyes: Negative for vision changes.  ENT: Negative for hoarseness, difficulty swallowing , nasal congestion. CV: Negative for chest pain, angina, palpitations, dyspnea on exertion, peripheral edema.  Respiratory: Negative for dyspnea at rest, dyspnea on exertion, cough, sputum, wheezing.  GI: See history of present illness. GU:  Negative for dysuria, hematuria, urinary incontinence, urinary frequency, nocturnal urination.  MS: Negative for joint pain, low back pain.  Derm: Negative for rash or itching.  Neuro: Negative for weakness, abnormal sensation, seizure, frequent headaches, memory loss, confusion.  Psych: Negative for anxiety, depression, suicidal ideation, hallucinations.  Endo: Negative for unusual weight change.  Heme: Negative for bruising or bleeding. Allergy: Negative for rash or hives.    Physical Examination:  BP 154/83  Pulse 89  Temp(Src) 97.4 F (36.3 C) (Oral)  Ht 5' 3" (1.6 m)  Wt 217 lb 12.8 oz (98.793 kg)  BMI 38.59 kg/m2   General: Well-nourished, well-developed in no acute distress.  Head: Normocephalic, atraumatic.   Eyes: Conjunctiva pink, no icterus. Mouth: Oropharyngeal mucosa moist and pink , no lesions erythema or exudate. Neck: Supple without thyromegaly, masses, or lymphadenopathy.  Lungs: Clear to auscultation bilaterally.  Heart: Regular rate and rhythm, no murmurs rubs or gallops.  Abdomen: Bowel sounds are normal, nontender, nondistended, no  hepatosplenomegaly or masses, no abdominal bruits or hernia , no rebound or guarding.   Rectal: not performed Extremities: No lower extremity edema. No clubbing or deformities.  Neuro: Alert and oriented x 4 , grossly normal neurologically.  Skin: Warm and dry, no rash or jaundice.   Psych: Alert and cooperative, normal mood and affect.  Labs: Labs from 02/19/2013. Sodium 140, potassium 4.2, glucose 108, BUN 17, creatinine 1.04, total bilirubin 0.3, alkaline phosphatase 72, AST 25, ALT 18, albumin 4.6, white blood cell count 7300, hemoglobin 12, hematocrit 36.5, MCV 85.7, platelets 252,000.  Imaging Studies: Dg Esophagus    05/13/2013   CLINICAL DATA:  Cervical dysphagia, feels like foods and liquids are getting stuck in the upper chest/lower throat area  EXAM: ESOPHOGRAM/BARIUM SWALLOW  TECHNIQUE: Combined double contrast and single contrast examination performed using effervescent crystals, thick barium liquid, and thin barium liquid.  COMPARISON:  None  FLUOROSCOPY TIME:  2 min 0 seconds  FINDINGS: Normal esophageal distention.  No esophageal mass or stricture.  12.5 mm diameter barium tablet passes were oral cavity to stomach without delay.  Diffuse moderate age-related impairment of esophageal motility.  Incomplete clearance of barium by primary peristaltic waves with numerous secondary and tertiary waves noted as well as prolonged thoracic retention of contrast.  Smooth appearance of esophageal mucosa on air contrast imaging without irregularity or ulceration.  No persistent intraluminal filling defects.  Targeted rapid sequence imaging of the cervical esophagus and hypopharynx demonstrates no laryngeal penetration or aspiration of contrast.  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  No evidence of esophageal mass or obstruction.   Electronically Signed   By: Mark  Boles M.D.   On: 05/13/2013 18:09      

## 2013-06-17 NOTE — Op Note (Signed)
[facility] [address] [place], [address]   ENDOSCOPY PROCEDURE REPORT  PATIENT: Laurie Mcfarland, Laurie Mcfarland  MR#: [id number] BIRTHDATE: [birth year] , 78  yrs. old GENDER: Female ENDOSCOPIST: R.  Roetta Sessions, MD FACP FACG REFERRED BY:  Artis Delay, M.D. PROCEDURE DATE:  06/17/2013 PROCEDURE:     EGD with Elease Hashimoto dilation followed by gastric biopsy  INDICATIONS:      Esophageal dysphagia  INFORMED CONSENT:   The risks, benefits, limitations, alternatives and imponderables have been discussed.  The potential for biopsy, esophogeal dilation, etc. have also been reviewed.  Questions have been answered.  All parties agreeable.  Please see the history and physical in the medical record for more information.  MEDICATIONS:   Versed 3 mg IV and Demerol 75 mg IV in divided doses. Cetacaine spray. Phenergan 12.5 mg IV  DESCRIPTION OF PROCEDURE:   The EG-[id number] ([id number])  endoscope was introduced through the mouth and advanced to the second portion of the duodenum without difficulty or limitations.  The mucosal surfaces were surveyed very carefully during advancement of the scope and upon withdrawal.  Retroflexion view of the proximal stomach and esophagogastric junction was performed.      FINDINGS: Normal appearing tubular esophagus. Stomach empty. Small hiatal hernia. Scattered gastric erosions. No ulcer or infiltrating process seen. Patent pylorus. Normal first and second portion of the duodenum  THERAPEUTIC / DIAGNOSTIC MANEUVERS PERFORMED:  A 56 French Maloney dilator was passed to full insertion easily. A look back revealed a small superficial tear at the GE junction.  Subsequently, biopsies abnormal gastric mucosa taken for histologic study.   COMPLICATIONS:  None  IMPRESSION:    Normal esophagus-status post Maloney dilation. Hiatal hernia. Gastric erosions   -   status post biopsy  RECOMMENDATIONS:   Continue omeprazole. Follow up on  pathology.    _______________________________ R. Roetta Sessions, MD FACP [facility] eSigned:  R. Roetta Sessions, MD FACP [facility] 06/17/2013 11:35 AM     CC:  PATIENT NAME:  Chasady, [place] MR#: [id number]

## 2013-06-21 ENCOUNTER — Encounter: Payer: Self-pay | Admitting: Internal Medicine

## 2013-06-22 ENCOUNTER — Encounter (HOSPITAL_COMMUNITY): Payer: Self-pay | Admitting: Internal Medicine

## 2013-09-16 ENCOUNTER — Ambulatory Visit: Payer: Medicare Other | Admitting: Gastroenterology

## 2013-09-17 ENCOUNTER — Ambulatory Visit: Payer: Medicare Other | Admitting: Gastroenterology

## 2013-10-07 ENCOUNTER — Ambulatory Visit: Payer: Medicare Other | Admitting: Gastroenterology

## 2013-10-18 ENCOUNTER — Telehealth: Payer: Self-pay | Admitting: *Deleted

## 2013-10-18 NOTE — Telephone Encounter (Signed)
Pt states she will reschedule her appointment that she is worried about weather, pt said she will call back to reschedule when the weather gets better, pt said she is 79 years old and she didn't want to be driving out in the weather, pt said she is sorry for canceling but she will reschedule.

## 2013-10-19 ENCOUNTER — Ambulatory Visit: Payer: Medicare Other | Admitting: Gastroenterology

## 2014-01-25 ENCOUNTER — Other Ambulatory Visit (HOSPITAL_COMMUNITY): Payer: Self-pay | Admitting: Internal Medicine

## 2014-01-25 DIAGNOSIS — Z1231 Encounter for screening mammogram for malignant neoplasm of breast: Secondary | ICD-10-CM

## 2014-01-27 ENCOUNTER — Ambulatory Visit (HOSPITAL_COMMUNITY)
Admission: RE | Admit: 2014-01-27 | Discharge: 2014-01-27 | Disposition: A | Discharge status: Home / Self Care | Payer: Medicare Other | Source: Ambulatory Visit | Attending: Internal Medicine | Admitting: Internal Medicine

## 2014-01-27 DIAGNOSIS — Z803 Family history of malignant neoplasm of breast: Secondary | ICD-10-CM | POA: Insufficient documentation

## 2014-01-27 DIAGNOSIS — Z1231 Encounter for screening mammogram for malignant neoplasm of breast: Secondary | ICD-10-CM | POA: Insufficient documentation

## 2014-04-14 ENCOUNTER — Telehealth: Payer: Self-pay

## 2014-04-14 NOTE — Telephone Encounter (Signed)
Letter to PCP

## 2014-04-14 NOTE — Telephone Encounter (Signed)
Pt does not have anyone that can help her or drive her. She would like to wait for now.

## 2014-07-13 ENCOUNTER — Inpatient Hospital Stay (HOSPITAL_COMMUNITY)
Admission: EM | Admit: 2014-07-13 | Discharge: 2014-07-15 | DRG: 689 | Disposition: A | Discharge status: Home / Self Care | Payer: Medicare Other | Attending: Family Medicine | Admitting: Family Medicine

## 2014-07-13 ENCOUNTER — Encounter (HOSPITAL_COMMUNITY): Payer: Self-pay

## 2014-07-13 ENCOUNTER — Emergency Department (HOSPITAL_COMMUNITY): Payer: Medicare Other

## 2014-07-13 DIAGNOSIS — F419 Anxiety disorder, unspecified: Secondary | ICD-10-CM | POA: Diagnosis present

## 2014-07-13 DIAGNOSIS — W01198A Fall on same level from slipping, tripping and stumbling with subsequent striking against other object, initial encounter: Secondary | ICD-10-CM | POA: Diagnosis present

## 2014-07-13 DIAGNOSIS — Z9181 History of falling: Secondary | ICD-10-CM

## 2014-07-13 DIAGNOSIS — Z87891 Personal history of nicotine dependence: Secondary | ICD-10-CM

## 2014-07-13 DIAGNOSIS — I10 Essential (primary) hypertension: Secondary | ICD-10-CM | POA: Diagnosis present

## 2014-07-13 DIAGNOSIS — G8929 Other chronic pain: Secondary | ICD-10-CM | POA: Diagnosis present

## 2014-07-13 DIAGNOSIS — M1990 Unspecified osteoarthritis, unspecified site: Secondary | ICD-10-CM | POA: Diagnosis present

## 2014-07-13 DIAGNOSIS — R410 Disorientation, unspecified: Secondary | ICD-10-CM | POA: Diagnosis not present

## 2014-07-13 DIAGNOSIS — F0781 Postconcussional syndrome: Secondary | ICD-10-CM | POA: Diagnosis present

## 2014-07-13 DIAGNOSIS — T424X5A Adverse effect of benzodiazepines, initial encounter: Secondary | ICD-10-CM | POA: Diagnosis present

## 2014-07-13 DIAGNOSIS — E119 Type 2 diabetes mellitus without complications: Secondary | ICD-10-CM

## 2014-07-13 DIAGNOSIS — R296 Repeated falls: Secondary | ICD-10-CM | POA: Diagnosis present

## 2014-07-13 DIAGNOSIS — G9340 Encephalopathy, unspecified: Secondary | ICD-10-CM

## 2014-07-13 DIAGNOSIS — G92 Toxic encephalopathy: Secondary | ICD-10-CM | POA: Diagnosis present

## 2014-07-13 DIAGNOSIS — W19XXXA Unspecified fall, initial encounter: Secondary | ICD-10-CM

## 2014-07-13 DIAGNOSIS — M797 Fibromyalgia: Secondary | ICD-10-CM | POA: Diagnosis present

## 2014-07-13 DIAGNOSIS — Y92009 Unspecified place in unspecified non-institutional (private) residence as the place of occurrence of the external cause: Secondary | ICD-10-CM

## 2014-07-13 DIAGNOSIS — Y92099 Unspecified place in other non-institutional residence as the place of occurrence of the external cause: Secondary | ICD-10-CM

## 2014-07-13 DIAGNOSIS — N390 Urinary tract infection, site not specified: Principal | ICD-10-CM | POA: Diagnosis present

## 2014-07-13 DIAGNOSIS — K219 Gastro-esophageal reflux disease without esophagitis: Secondary | ICD-10-CM | POA: Diagnosis present

## 2014-07-13 DIAGNOSIS — E785 Hyperlipidemia, unspecified: Secondary | ICD-10-CM | POA: Diagnosis present

## 2014-07-13 DIAGNOSIS — S0083XA Contusion of other part of head, initial encounter: Secondary | ICD-10-CM | POA: Diagnosis present

## 2014-07-13 DIAGNOSIS — T391X5A Adverse effect of 4-Aminophenol derivatives, initial encounter: Secondary | ICD-10-CM | POA: Diagnosis present

## 2014-07-13 LAB — CBC WITH DIFFERENTIAL/PLATELET
BASOS ABS: 0 10*3/uL (ref 0.0–0.1)
Basophils Relative: 0 % (ref 0–1)
EOS ABS: 0.1 10*3/uL (ref 0.0–0.7)
Eosinophils Relative: 1 % (ref 0–5)
HEMATOCRIT: 37.2 % (ref 36.0–46.0)
Hemoglobin: 12 g/dL (ref 12.0–15.0)
Lymphocytes Relative: 29 % (ref 12–46)
Lymphs Abs: 2.4 10*3/uL (ref 0.7–4.0)
MCH: 30.2 pg (ref 26.0–34.0)
MCHC: 32.3 g/dL (ref 30.0–36.0)
MCV: 93.5 fL (ref 78.0–100.0)
Monocytes Absolute: 0.6 10*3/uL (ref 0.1–1.0)
Monocytes Relative: 7 % (ref 3–12)
Neutro Abs: 5 10*3/uL (ref 1.7–7.7)
Neutrophils Relative %: 63 % (ref 43–77)
PLATELETS: 226 10*3/uL (ref 150–400)
RBC: 3.98 MIL/uL (ref 3.87–5.11)
RDW: 13.6 % (ref 11.5–15.5)
WBC: 8 10*3/uL (ref 4.0–10.5)

## 2014-07-13 LAB — URINALYSIS, ROUTINE W REFLEX MICROSCOPIC
Bilirubin Urine: NEGATIVE
GLUCOSE, UA: NEGATIVE mg/dL
KETONES UR: NEGATIVE mg/dL
LEUKOCYTES UA: NEGATIVE
Nitrite: POSITIVE — AB
PROTEIN: NEGATIVE mg/dL
Specific Gravity, Urine: 1.025 (ref 1.005–1.030)
Urobilinogen, UA: 0.2 mg/dL (ref 0.0–1.0)
pH: 6 (ref 5.0–8.0)

## 2014-07-13 LAB — COMPREHENSIVE METABOLIC PANEL
ALK PHOS: 63 U/L (ref 39–117)
ALT: 14 U/L (ref 0–35)
AST: 22 U/L (ref 0–37)
Albumin: 3.7 g/dL (ref 3.5–5.2)
Anion gap: 12 (ref 5–15)
BUN: 20 mg/dL (ref 6–23)
CHLORIDE: 100 meq/L (ref 96–112)
CO2: 30 meq/L (ref 19–32)
Calcium: 9.4 mg/dL (ref 8.4–10.5)
Creatinine, Ser: 0.85 mg/dL (ref 0.50–1.10)
GFR calc Af Amer: 75 mL/min — ABNORMAL LOW (ref >90)
GFR, EST NON AFRICAN AMERICAN: 64 mL/min — AB (ref >90)
Glucose, Bld: 125 mg/dL — ABNORMAL HIGH (ref 70–99)
POTASSIUM: 4.3 meq/L (ref 3.7–5.3)
Sodium: 142 mEq/L (ref 137–147)
Total Bilirubin: 0.3 mg/dL (ref 0.3–1.2)
Total Protein: 7.8 g/dL (ref 6.0–8.3)

## 2014-07-13 LAB — URINE MICROSCOPIC-ADD ON

## 2014-07-13 LAB — GLUCOSE, CAPILLARY
GLUCOSE-CAPILLARY: 175 mg/dL — AB (ref 70–99)
GLUCOSE-CAPILLARY: 132 mg/dL — AB (ref 70–99)

## 2014-07-13 LAB — TROPONIN I: Troponin I: <0.3 ng/mL (ref <0.30)

## 2014-07-13 MED ORDER — PANTOPRAZOLE SODIUM 40 MG PO TBEC
40.0000 mg | DELAYED_RELEASE_TABLET | Freq: Every day | ORAL | Status: DC
  Administered 2014-07-13 – 2014-07-15 (×3): 40 mg via ORAL
  Filled 2014-07-13 (×3): qty 1

## 2014-07-13 MED ORDER — CIPROFLOXACIN IN D5W 400 MG/200ML IV SOLN
400.0000 mg | Freq: Two times a day (BID) | INTRAVENOUS | Status: AC
  Administered 2014-07-13 – 2014-07-14 (×3): 400 mg via INTRAVENOUS
  Filled 2014-07-13 (×3): qty 200

## 2014-07-13 MED ORDER — INSULIN ASPART 100 UNIT/ML ~~LOC~~ SOLN
0.0000 [IU] | Freq: Three times a day (TID) | SUBCUTANEOUS | Status: DC
  Administered 2014-07-13: 2 [IU] via SUBCUTANEOUS
  Administered 2014-07-14: 1 [IU] via SUBCUTANEOUS
  Administered 2014-07-14: 2 [IU] via SUBCUTANEOUS
  Administered 2014-07-15: 1 [IU] via SUBCUTANEOUS

## 2014-07-13 MED ORDER — ACETAMINOPHEN 650 MG RE SUPP: 650.0000 mg | Freq: Four times a day (QID) | RECTAL | Status: DC | PRN

## 2014-07-13 MED ORDER — SIMVASTATIN 20 MG PO TABS
20.0000 mg | ORAL_TABLET | Freq: Every evening | ORAL | Status: DC
  Administered 2014-07-13 – 2014-07-14 (×2): 20 mg via ORAL
  Filled 2014-07-13 (×2): qty 1

## 2014-07-13 MED ORDER — CIPROFLOXACIN IN D5W 400 MG/200ML IV SOLN
400.0000 mg | Freq: Once | INTRAVENOUS | Status: AC
  Administered 2014-07-13: 400 mg via INTRAVENOUS
  Filled 2014-07-13: qty 200

## 2014-07-13 MED ORDER — SODIUM CHLORIDE 0.9 % IJ SOLN
3.0000 mL | Freq: Two times a day (BID) | INTRAMUSCULAR | Status: DC
  Administered 2014-07-13 – 2014-07-14 (×4): 3 mL via INTRAVENOUS

## 2014-07-13 MED ORDER — ACETAMINOPHEN 325 MG PO TABS: 650.0000 mg | ORAL_TABLET | Freq: Four times a day (QID) | ORAL | Status: DC | PRN

## 2014-07-13 MED ORDER — POTASSIUM CHLORIDE CRYS ER 20 MEQ PO TBCR
20.0000 meq | EXTENDED_RELEASE_TABLET | Freq: Every morning | ORAL | Status: DC
  Administered 2014-07-13 – 2014-07-15 (×3): 20 meq via ORAL
  Filled 2014-07-13 (×3): qty 1

## 2014-07-13 MED ORDER — SODIUM CHLORIDE 0.9 % IV SOLN
250.0000 mL | INTRAVENOUS | Status: DC | PRN
Start: 2014-07-13 — End: 2014-07-15

## 2014-07-13 MED ORDER — ALPRAZOLAM 0.5 MG PO TABS
0.5000 mg | ORAL_TABLET | Freq: Four times a day (QID) | ORAL | Status: DC | PRN
  Administered 2014-07-13 – 2014-07-15 (×3): 0.5 mg via ORAL
  Filled 2014-07-13 (×3): qty 1

## 2014-07-13 MED ORDER — SODIUM CHLORIDE 0.9 % IJ SOLN: 3.0000 mL | INTRAMUSCULAR | Status: DC | PRN

## 2014-07-13 MED ORDER — ACETAMINOPHEN 325 MG PO TABS
650.0000 mg | ORAL_TABLET | Freq: Once | ORAL | Status: AC
  Administered 2014-07-13: 650 mg via ORAL
  Filled 2014-07-13: qty 2

## 2014-07-13 MED ORDER — HYDROCODONE-ACETAMINOPHEN 10-325 MG PO TABS
1.0000 | ORAL_TABLET | Freq: Four times a day (QID) | ORAL | Status: DC | PRN
  Administered 2014-07-14 (×2): 1 via ORAL
  Filled 2014-07-13 (×2): qty 1

## 2014-07-13 NOTE — Plan of Care (Signed)
Problem: Phase I Progression Outcomes Goal: Initial discharge plan identified Outcome: Progressing     

## 2014-07-13 NOTE — Plan of Care (Signed)
Problem: Phase I Progression Outcomes Goal: Pain controlled with appropriate interventions Outcome: Progressing     

## 2014-07-13 NOTE — Plan of Care (Signed)
Problem: Phase I Progression Outcomes Goal: Voiding-avoid urinary catheter unless indicated Outcome: Completed/Met Date Met:  07/13/14     

## 2014-07-13 NOTE — ED Notes (Signed)
PT reports fell last night around 10pm.  Pt has bruising and hematoma to left side of face and forehead.  Pt reports has had some dizziness this week and fell after getting up from a chair last night. EMS says pt's home health nurse says pt is taking more hydrocodone and xanax than prescribed.  Says pt has fallen twice in the past week.

## 2014-07-13 NOTE — ED Provider Notes (Signed)
CSN: [id number]     Arrival date & time 07/13/14  0932 History  This chart was scribed for Janice Norrie, MD by Molli Posey, ED Scribe. This patient was seen in room APA07/AP[facility]07 and the patient's care was started 10:05 AM.    Chief Complaint  Patient presents with  . Fall   The history is provided by the patient. No language interpreter was used.     HPI Comments: Laurie Mcfarland is a 79 y.o. female who presents to the Emergency Department complaining of fall that occurred last night when pt fell when she got up out of her chair to turn off the TV. Pt reports she fell on her carpet and got herself up by crawling until she got get somewhere to pull herself up. She states the left side of her face and forehead on her face that she struck from the fall are burning. Pt complains of bilateral hip pain and back pain but states she had this pain prior to her fall. She has been able to ambulate since she fell. She reports that she normally walks with a cane. Her daughter states she was at her house last night and when her daughter left she didn't have any bruising of her face. Her daughter reports that pt fell about 2 weeks ago and had associated bruising and confusion since that time. Pt went to Dr. Nolon Rod office last week for a checkup. She also has an appointment to see him in 5 days.  She denies nausea, vomiting, blurred vision, CP, neck pain.  PCP Dr. Gerarda Fraction  Past Medical History  Diagnosis Date  . Hypertension   . Shortness of breath     with exertion  . Diabetes mellitus   . GERD (gastroesophageal reflux disease)   . Anxiety   . Arthritis   . Hyperlipidemia   . Fibromyalgia   . Complication of anesthesia     pt. states that she remembers parts of her surgery when she had her TAH   Past Surgical History  Procedure Laterality Date  . Abdominal hysterectomy    . Foreign body removal      splinter removed from right side of face  . Cataract extraction w/phaco  12/10/2011    Procedure:  CATARACT EXTRACTION PHACO AND INTRAOCULAR LENS PLACEMENT (IOC);  Surgeon: Elta Guadeloupe T. Gershon Crane, MD;  Location: [facility];  Service: Ophthalmology;  Laterality: Left;  CDE=10.72  . Cataract extraction w/phaco  01/14/2012    Procedure: CATARACT EXTRACTION PHACO AND INTRAOCULAR LENS PLACEMENT (IOC);  Surgeon: Elta Guadeloupe T. Gershon Crane, MD;  Location: [facility];  Service: Ophthalmology;  Laterality: Right;  CDE:11.63  . Esophagogastroduodenoscopy (egd) with esophageal dilation N/A 06/17/2013    YQM:VHQION esophagus-status post [facility] dilation. Hiatal hernia. Gastric erosions s/p bx   Family History  Problem Relation Age of Onset  . Pseudochol deficiency Neg Hx   . Malignant hyperthermia Neg Hx   . Hypotension Neg Hx   . Anesthesia problems Neg Hx   . Colon cancer Neg Hx   . Cancer Father   . Alzheimer's disease Mother   . Cancer Sister   . Cancer Brother    History  Substance Use Topics  . Smoking status: Former Research scientist (life sciences)  . Smokeless tobacco: Not on file  . Alcohol Use: No   Lives at home Lives alone  OB History    No data available     Review of Systems  Eyes: Negative for visual disturbance.  Cardiovascular: Negative for chest pain.  Gastrointestinal: Negative for nausea and vomiting.  Musculoskeletal: Positive for back pain. Negative for neck pain.  Skin: Positive for color change and wound.  Psychiatric/Behavioral: Positive for confusion.  All other systems reviewed and are negative.     Allergies  Penicillins  Home Medications   Prior to Admission medications   Medication Sig Start Date End Date Taking? Authorizing Provider  ALPRAZolam Duanne Moron) 1 MG tablet Take 1 mg by mouth 4 (four) times daily as needed. Nerves    Historical Provider, MD  amLODipine (NORVASC) 10 MG tablet Take 10 mg by mouth daily.  05/19/13   Historical Provider, MD  aspirin EC 81 MG tablet Take 81 mg by mouth every morning.     Historical Provider, MD  fish oil-omega-3 fatty acids 1000 MG capsule Take 1 g by mouth 3  (three) times daily.     Historical Provider, MD  furosemide (LASIX) 20 MG tablet Take 20 mg by mouth daily.  06/01/13   Historical Provider, MD  gabapentin (NEURONTIN) 100 MG capsule Take 100 mg by mouth 3 (three) times daily.     Historical Provider, MD  HYDROcodone-acetaminophen (LORCET) 10-650 MG per tablet Take 1 tablet by mouth every 6 (six) hours as needed. Pain    Historical Provider, MD  meloxicam (MOBIC) 7.5 MG tablet Take 7.5 mg by mouth every morning.     Historical Provider, MD  metFORMIN (GLUCOPHAGE) 500 MG tablet Take 500 mg by mouth every morning.     Historical Provider, MD  mirtazapine (REMERON) 15 MG tablet Take 15 mg by mouth at bedtime.    Historical Provider, MD  Misc Natural Products (OSTEO BI-FLEX ADV DOUBLE ST PO) Take 1 tablet by mouth every morning.     Historical Provider, MD  Multiple Vitamin (MULTIVITAMIN WITH MINERALS) TABS tablet Take 1 tablet by mouth daily.    Historical Provider, MD  multivitamin-lutein (OCUVITE-LUTEIN) CAPS Take 1 capsule by mouth every morning.     Historical Provider, MD  omeprazole (PRILOSEC) 40 MG capsule Take 40 mg by mouth daily.    Historical Provider, MD  potassium chloride SA (K-DUR,KLOR-CON) 20 MEQ tablet Take 20 mEq by mouth every morning.     Historical Provider, MD  Probiotic Product (TRUBIOTICS) CAPS Take 1 capsule by mouth every morning.     Historical Provider, MD  simvastatin (ZOCOR) 20 MG tablet Take 20 mg by mouth every evening.    Historical Provider, MD  vitamin C (ASCORBIC ACID) 500 MG tablet Take 500 mg by mouth every morning.     Historical Provider, MD   BP 151/71 mmHg  Pulse 74  Temp(Src) 97.9 F (36.6 C) (Oral)  Resp 16  Ht 5' (1.524 m)  Wt 190 lb (86.183 kg)  BMI 37.11 kg/m2  SpO2 94%  Vital signs normal   Physical Exam  Constitutional: She appears well-developed and well-nourished.  Non-toxic appearance. She does not appear ill. No distress.  HENT:  Head: Normocephalic.  Right Ear: External ear normal.   Left Ear: External ear normal.  Nose: Nose normal. No mucosal edema or rhinorrhea.  Mouth/Throat: Oropharynx is clear and moist and mucous membranes are normal. No dental abscesses or uvula swelling.  Bruising and swelling on the forehead and left cheek. Has diffuse tenderness to palpation  Eyes: Conjunctivae and EOM are normal. Pupils are equal, round, and reactive to light.  Small conjunctival hemorrhage of the lateral left globe.  Neck: Normal range of motion and full passive range of motion without pain. Neck supple.  Cardiovascular: Normal rate, regular rhythm and normal heart sounds.  Exam reveals no gallop and no friction rub.   No murmur heard. Pulmonary/Chest: Effort normal and breath sounds normal. No respiratory distress. She has no wheezes. She has no rhonchi. She has no rales. She exhibits no tenderness and no crepitus.  Abdominal: Soft. Normal appearance and bowel sounds are normal. She exhibits no distension. There is no tenderness. There is no rebound and no guarding.  Musculoskeletal: Normal range of motion. She exhibits no edema or tenderness.  Moves all extremities well without pain. No deformity, swelling or bruising.   Neurological: She has normal strength. No cranial nerve deficit.  Confused to the year (either 1938 or 1980), day of the week and cannot recall the president. Something her daughter states she would normally know. She does know it is November  Skin: Skin is warm, dry and intact. No rash noted. No erythema. No pallor.     Psychiatric: She has a normal mood and affect. Her speech is normal and behavior is normal. Her mood appears not anxious.  Nursing note and vitals reviewed.      ED Course  Procedures   Medications  ciprofloxacin (CIPRO) IVPB 400 mg (not administered)  ciprofloxacin (CIPRO) IVPB 400 mg (400 mg Intravenous Transfusing/Transfer 07/13/14 1225)  acetaminophen (TYLENOL) tablet 650 mg (650 mg Oral Given 07/13/14 1225)    DIAGNOSTIC  STUDIES: Oxygen Saturation is 94% on RA, normal by my interpretation.    COORDINATION OF CARE: 10:16 AM Discussed treatment plan with pt at bedside and pt agreed to plan. CT head and blood work.   Patient appears to have a UTI, she is allergic to penicillins. She was started on IV Cipro.  11:47 AM Discussed CT scan and UTI infection with daughter and pateint. Reports she has a HA now. Pt given tylenol for her headache. They are agreeable for admission.   11:54 Dr Sarajane Jews admit to obs, med-surg, team 1  Labs Review Results for orders placed or performed during the hospital encounter of 07/13/14  Comprehensive metabolic panel  Result Value Ref Range   Sodium 142 137 - 147 mEq/L   Potassium 4.3 3.7 - 5.3 mEq/L   Chloride 100 96 - 112 mEq/L   CO2 30 19 - 32 mEq/L   Glucose, Bld 125 (H) 70 - 99 mg/dL   BUN 20 6 - 23 mg/dL   Creatinine, Ser 0.85 0.50 - 1.10 mg/dL   Calcium 9.4 8.4 - 10.5 mg/dL   Total Protein 7.8 6.0 - 8.3 g/dL   Albumin 3.7 3.5 - 5.2 g/dL   AST 22 0 - 37 U/L   ALT 14 0 - 35 U/L   Alkaline Phosphatase 63 39 - 117 U/L   Total Bilirubin 0.3 0.3 - 1.2 mg/dL   GFR calc non Af Amer 64 (L) >90 mL/min   GFR calc Af Amer 75 (L) >90 mL/min   Anion gap 12 5 - 15  CBC with Differential  Result Value Ref Range   WBC 8.0 4.0 - 10.5 K/uL   RBC 3.98 3.87 - 5.11 MIL/uL   Hemoglobin 12.0 12.0 - 15.0 g/dL   HCT 37.2 36.0 - 46.0 %   MCV 93.5 78.0 - 100.0 fL   MCH 30.2 26.0 - 34.0 pg   MCHC 32.3 30.0 - 36.0 g/dL   RDW 13.6 11.5 - 15.5 %   Platelets 226 150 - 400 K/uL   Neutrophils Relative % 63 43 - 77 %   Neutro  Abs 5.0 1.7 - 7.7 K/uL   Lymphocytes Relative 29 12 - 46 %   Lymphs Abs 2.4 0.7 - 4.0 K/uL   Monocytes Relative 7 3 - 12 %   Monocytes Absolute 0.6 0.1 - 1.0 K/uL   Eosinophils Relative 1 0 - 5 %   Eosinophils Absolute 0.1 0.0 - 0.7 K/uL   Basophils Relative 0 0 - 1 %   Basophils Absolute 0.0 0.0 - 0.1 K/uL  Urinalysis, Routine w reflex microscopic  Result  Value Ref Range   Color, Urine YELLOW YELLOW   APPearance CLEAR CLEAR   Specific Gravity, Urine 1.025 1.005 - 1.030   pH 6.0 5.0 - 8.0   Glucose, UA NEGATIVE NEGATIVE mg/dL   Hgb urine dipstick TRACE (A) NEGATIVE   Bilirubin Urine NEGATIVE NEGATIVE   Ketones, ur NEGATIVE NEGATIVE mg/dL   Protein, ur NEGATIVE NEGATIVE mg/dL   Urobilinogen, UA 0.2 0.0 - 1.0 mg/dL   Nitrite POSITIVE (A) NEGATIVE   Leukocytes, UA NEGATIVE NEGATIVE  Troponin I  Result Value Ref Range   Troponin I <0.30 <0.30 ng/mL  Urine microscopic-add on  Result Value Ref Range   RBC / HPF 3-6 <3 RBC/hpf   Bacteria, UA MANY (A) RARE   Laboratory interpretation all normal except UTI    Imaging Review  Ct Head Wo Contrast  Ct Maxillofacial Wo Cm  07/13/2014   CLINICAL DATA:  [place] last night in living room after getting up from watching television, struck head, bruising LEFT frontal region, personal history hypertension, diabetes mellitus  EXAM: CT HEAD WITHOUT CONTRAST  CT MAXILLOFACIAL WITHOUT CONTRAST  TECHNIQUE: Multidetector CT imaging of the head and maxillofacial structures were performed using the standard protocol without intravenous contrast. Multiplanar CT image reconstructions of the maxillofacial structures were also generated.  COMPARISON:  CT head 12/24/2005  FINDINGS: CT HEAD FINDINGS  Generalized atrophy.  Normal ventricular morphology.  No midline shift or mass effect.  No intracranial hemorrhage, mass lesion, or evidence acute infarction.  No definite extra-axial fluid collections.  Frontal scalp hematoma greater on LEFT.  Atherosclerotic calcification at carotid siphons.  Calvaria intact.  CT MAXILLOFACIAL FINDINGS  Visualized intracranial structures unremarkable.  Frontal scalp hematoma greater on LEFT, extending LEFT temporal.  Intraorbital soft tissue planes clear.  Calvaria intact.  Facial bones intact.  Paranasal sinuses, mastoid air cells and middle ear cavities clear.  Visualize cervical spine  intact with degenerative disc disease changes at C4-C5 and C5-C6.  Nasal septum midline.  IMPRESSION: No acute intracranial abnormalities.  Frontal scalp hematoma greater on LEFT and extending LEFT temporal.  No acute facial bone abnormalities.   Electronically Signed   By: Lavonia Dana M.D.   On: 07/13/2014 11:01     EKG Interpretation   Date/Time:  Wednesday July 13 2014 09:45:51 EST Ventricular Rate:  75 PR Interval:  164 QRS Duration: 96 QT Interval:  413 QTC Calculation: 461 R Axis:   73 Text Interpretation:  Sinus rhythm Low voltage, precordial leads No  significant change since last tracing 04 Dec 2011 Confirmed by [facility]   MD-I, Jernard Reiber ([id number]) on 07/13/2014 11:28:09 AM      MDM  Patient has had 2 falls in the past 2 weeks with contusion of her head both times. She does have a UTI today. There is some concern in the family that she is taking her narcotic pain medication and her Xanax more often than she should. Patient has significant confusion today that family states is not usual for  her.   Final diagnoses:  Fall at home, initial encounter  Confusion  Contusion of face, initial encounter  Urinary tract infection without hematuria, site unspecified    Plan admission  Rolland Porter, MD, FACEP    I personally performed the services described in this documentation, which was scribed in my presence. The recorded information has been reviewed and considered.  Rolland Porter, MD, Abram Sander      Janice Norrie, MD 07/13/14 [phone]

## 2014-07-13 NOTE — ED Notes (Signed)
Family reports a fall last week and pt hit her head and has been a little more confused since the previous fall before today. Family also reports pt has been taking more hydrocodone and xanax than prescribed but they had stepped in for help with her medication administration at home. PT states she fell this am getting out of bed and slipped on her flip flops and hit the left side of her head on the carpet. Bruising noted to left forehead.

## 2014-07-13 NOTE — Plan of Care (Signed)
Problem: Phase I Progression Outcomes Goal: Hemodynamically stable Outcome: Progressing  Comments:  IV Cipro for UTI

## 2014-07-13 NOTE — Plan of Care (Signed)
Problem: Phase I Progression Outcomes Goal: Tolerating diet Outcome: Completed/Met Date Met:  07/13/14

## 2014-07-13 NOTE — Plan of Care (Signed)
Problem: Phase I Progression Outcomes Goal: OOB as tolerated unless otherwise ordered Outcome: Completed/Met Date Met:  07/13/14  Comments:  OOB with assistance bed alarm in use

## 2014-07-13 NOTE — H&P (Signed)
History and Physical  Laurie Mcfarland YYQ:[id number] DOB: Oct 15, 1934 DOA: 07/13/2014  Referring physician: Dr. Eliane Decree in ED PCP: Glo Herring., MD   Chief Complaint: fall at home  HPI:  79 year old woman presented to the emergency department with history of fall at home last evening. Initial evaluation suggested UTI, no trauma. Noted to be confused.  History obtained from patient as well as daughter at bedside. Patient takes Xanax 2 mg 4 times a day as well as Lortab 10 mg 4 times a day. Family noted this made the patient quite somnolent and therefore in the last 2 weeks they have decreased Xanax dose by half. Lortab dose has not changed. Patient has had multiple falls over the last 2 weeks. Patient reports last evening she got up, seemed to loose her balance and fell, striking her left face against the carpet. No syncope or loss of consciousness. She denies any other musculoskeletal complaints. Review of ED documentation "pt's home health nurse says pt is taking more hydrocodone and xanax than prescribed".daughter reports she has full control of medications now.  In the emergency department afebrile, VSS, borderline orthostatics (pulse). Borderline hypoxia on RA. CMP, troponin, CBC unremarkable. U/A equivocal. EKG SR no acute changes, independently reviewed. CT head and maxillofacial no acute abnormalities.   Review of Systems:  Negative for fever, visual changes, sore throat, rash, new muscle aches, chest pain, SOB, dysuria, bleeding, n/v/abdominal pain.  Past Medical History  Diagnosis Date  . Hypertension   . Shortness of breath     with exertion  . Diabetes mellitus   . GERD (gastroesophageal reflux disease)   . Anxiety   . Arthritis   . Hyperlipidemia   . Fibromyalgia   . Complication of anesthesia     pt. states that she remembers parts of her surgery when she had her TAH    Past Surgical History  Procedure Laterality Date  . Abdominal hysterectomy    . Foreign body  removal      splinter removed from right side of face  . Cataract extraction w/phaco  12/10/2011    Procedure: CATARACT EXTRACTION PHACO AND INTRAOCULAR LENS PLACEMENT (IOC);  Surgeon: Elta Guadeloupe T. Gershon Crane, MD;  Location: [facility];  Service: Ophthalmology;  Laterality: Left;  CDE=10.72  . Cataract extraction w/phaco  01/14/2012    Procedure: CATARACT EXTRACTION PHACO AND INTRAOCULAR LENS PLACEMENT (IOC);  Surgeon: Elta Guadeloupe T. Gershon Crane, MD;  Location: [facility];  Service: Ophthalmology;  Laterality: Right;  CDE:11.63  . Esophagogastroduodenoscopy (egd) with esophageal dilation N/A 06/17/2013    UGQ:BVQXIH esophagus-status post [facility] dilation. Hiatal hernia. Gastric erosions s/p bx    Social History:  reports that she has never smoked. She does not have any smokeless tobacco history on file. She reports that she does not drink alcohol or use illicit drugs.  Allergies  Allergen Reactions  . Penicillins Rash    Family History  Problem Relation Age of Onset  . Pseudochol deficiency Neg Hx   . Malignant hyperthermia Neg Hx   . Hypotension Neg Hx   . Anesthesia problems Neg Hx   . Colon cancer Neg Hx   . Cancer Father   . Alzheimer's disease Mother   . Cancer Sister   . Cancer Brother      Prior to Admission medications   Medication Sig Start Date End Date Taking? Authorizing Provider  alprazolam Duanne Moron) 2 MG tablet Take 1-2 mg by mouth 4 (four) times daily.   Yes Historical Provider, MD  amLODipine (NORVASC) 10  MG tablet Take 10 mg by mouth daily.  05/19/13  Yes Historical Provider, MD  aspirin EC 81 MG tablet Take 81 mg by mouth daily as needed (chest pain).    Yes Historical Provider, MD  Cholecalciferol (VITAMIN D-3) 1000 UNITS CAPS Take 1 capsule by mouth 2 (two) times daily.    Yes Historical Provider, MD  fish oil-omega-3 fatty acids 1000 MG capsule Take 1 g by mouth 3 (three) times daily.    Yes Historical Provider, MD  furosemide (LASIX) 20 MG tablet Take 20 mg by mouth daily as needed for  fluid or edema.  06/01/13  Yes Historical Provider, MD  gabapentin (NEURONTIN) 100 MG capsule Take 100 mg by mouth 3 (three) times daily.    Yes Historical Provider, MD  Ginkgo Biloba 60 MG CAPS Take 1 capsule by mouth daily.    Yes Historical Provider, MD  HYDROcodone-acetaminophen (NORCO) 10-325 MG per tablet Take 1 tablet by mouth 4 (four) times daily. 06/29/14  Yes Historical Provider, MD  meloxicam (MOBIC) 7.5 MG tablet Take 7.5 mg by mouth daily as needed for pain.    Yes Historical Provider, MD  metFORMIN (GLUCOPHAGE) 500 MG tablet Take 500 mg by mouth 2 (two) times daily with a meal.    Yes Historical Provider, MD  Misc Natural Products (OSTEO BI-FLEX ADV DOUBLE ST PO) Take 1 tablet by mouth every morning.    Yes Historical Provider, MD  Multiple Vitamin (MULTIVITAMIN WITH MINERALS) TABS tablet Take 1 tablet by mouth daily.   Yes Historical Provider, MD  multivitamin-lutein (OCUVITE-LUTEIN) CAPS Take 1 capsule by mouth every morning.    Yes Historical Provider, MD  omeprazole (PRILOSEC) 40 MG capsule Take 40 mg by mouth daily.   Yes Historical Provider, MD  Probiotic Product (TRUBIOTICS) CAPS Take 1 capsule by mouth every morning.    Yes Historical Provider, MD  simvastatin (ZOCOR) 20 MG tablet Take 20 mg by mouth every evening.   Yes Historical Provider, MD  ALPRAZolam Duanne Moron) 1 MG tablet Take 1 mg by mouth 4 (four) times daily as needed. Nerves    Historical Provider, MD  HYDROcodone-acetaminophen (LORCET) 10-650 MG per tablet Take 1 tablet by mouth every 6 (six) hours as needed. Pain    Historical Provider, MD  mirtazapine (REMERON) 15 MG tablet Take 15 mg by mouth at bedtime.    Historical Provider, MD  potassium chloride SA (K-DUR,KLOR-CON) 20 MEQ tablet Take 20 mEq by mouth every morning.     Historical Provider, MD  vitamin C (ASCORBIC ACID) 500 MG tablet Take 500 mg by mouth every morning.     Historical Provider, MD   Physical Exam: Filed Vitals:   07/13/14 1019 07/13/14 1030  07/13/14 1045 07/13/14 1100  BP: 148/71 129/78  131/60  Pulse: 78 77 74 68  Temp:      TempSrc:      Resp: 26 18 18 16   Height:      Weight:      SpO2: 92% 89% 94% 89%    General: examined in the emergency department. Appears calm and comfortable Eyes: PERRL, normal lids, irises, left lateral subconjunctival hemorrhage noted. ENT: grossly normal hearing, lips & tongue Neck: no LAD, masses or thyromegaly Cardiovascular: RRR, no m/r/g. No LE edema. Respiratory: CTA bilaterally, no w/r/r. Normal respiratory effort. Abdomen: soft, ntnd Skin: bruising noted to the left forehead, temple, face. Musculoskeletal: grossly normal tone BUE/BLE Psychiatric: grossly normal mood and affect, speech fluent and appropriate. Oriented to self, location. Not  month or year. Neurologic: Non-focal.  Wt Readings from Last 3 Encounters:  07/13/14 86.183 kg (190 lb)  06/17/13 98.431 kg (217 lb)  06/09/13 98.793 kg (217 lb 12.8 oz)    Labs on Admission:  Basic Metabolic Panel:  Recent Labs Lab 07/13/14 1023  NA 142  K 4.3  CL 100  CO2 30  GLUCOSE 125*  BUN 20  CREATININE 0.85  CALCIUM 9.4    Liver Function Tests:  Recent Labs Lab 07/13/14 1023  AST 22  ALT 14  ALKPHOS 63  BILITOT 0.3  PROT 7.8  ALBUMIN 3.7   CBC:  Recent Labs Lab 07/13/14 1023  WBC 8.0  NEUTROABS 5.0  HGB 12.0  HCT 37.2  MCV 93.5  PLT 226    Cardiac Enzymes:  Recent Labs Lab 07/13/14 1023  TROPONINI <0.30    Radiological Exams on Admission: Ct Head Wo Contrast  07/13/2014   CLINICAL DATA:  [place] last night in living room after getting up from watching television, struck head, bruising LEFT frontal region, personal history hypertension, diabetes mellitus  EXAM: CT HEAD WITHOUT CONTRAST  CT MAXILLOFACIAL WITHOUT CONTRAST  TECHNIQUE: Multidetector CT imaging of the head and maxillofacial structures were performed using the standard protocol without intravenous contrast. Multiplanar CT image  reconstructions of the maxillofacial structures were also generated.  COMPARISON:  CT head 12/24/2005  FINDINGS: CT HEAD FINDINGS  Generalized atrophy.  Normal ventricular morphology.  No midline shift or mass effect.  No intracranial hemorrhage, mass lesion, or evidence acute infarction.  No definite extra-axial fluid collections.  Frontal scalp hematoma greater on LEFT.  Atherosclerotic calcification at carotid siphons.  Calvaria intact.  CT MAXILLOFACIAL FINDINGS  Visualized intracranial structures unremarkable.  Frontal scalp hematoma greater on LEFT, extending LEFT temporal.  Intraorbital soft tissue planes clear.  Calvaria intact.  Facial bones intact.  Paranasal sinuses, mastoid air cells and middle ear cavities clear.  Visualize cervical spine intact with degenerative disc disease changes at C4-C5 and C5-C6.  Nasal septum midline.  IMPRESSION: No acute intracranial abnormalities.  Frontal scalp hematoma greater on LEFT and extending LEFT temporal.  No acute facial bone abnormalities.   Electronically Signed   By: Lavonia Dana M.D.   On: 07/13/2014 11:01   Ct Maxillofacial Wo Cm  07/13/2014   CLINICAL DATA:  [place] last night in living room after getting up from watching television, struck head, bruising LEFT frontal region, personal history hypertension, diabetes mellitus  EXAM: CT HEAD WITHOUT CONTRAST  CT MAXILLOFACIAL WITHOUT CONTRAST  TECHNIQUE: Multidetector CT imaging of the head and maxillofacial structures were performed using the standard protocol without intravenous contrast. Multiplanar CT image reconstructions of the maxillofacial structures were also generated.  COMPARISON:  CT head 12/24/2005  FINDINGS: CT HEAD FINDINGS  Generalized atrophy.  Normal ventricular morphology.  No midline shift or mass effect.  No intracranial hemorrhage, mass lesion, or evidence acute infarction.  No definite extra-axial fluid collections.  Frontal scalp hematoma greater on LEFT.  Atherosclerotic calcification at  carotid siphons.  Calvaria intact.  CT MAXILLOFACIAL FINDINGS  Visualized intracranial structures unremarkable.  Frontal scalp hematoma greater on LEFT, extending LEFT temporal.  Intraorbital soft tissue planes clear.  Calvaria intact.  Facial bones intact.  Paranasal sinuses, mastoid air cells and middle ear cavities clear.  Visualize cervical spine intact with degenerative disc disease changes at C4-C5 and C5-C6.  Nasal septum midline.  IMPRESSION: No acute intracranial abnormalities.  Frontal scalp hematoma greater on LEFT and extending LEFT temporal.  No  acute facial bone abnormalities.   Electronically Signed   By: Lavonia Dana M.D.   On: 07/13/2014 11:01    Principal Problem:   UTI (lower urinary tract infection) Active Problems:   Confusion   Acute encephalopathy   Fall at home   DM type 2 (diabetes mellitus, type 2)   Assessment/Plan 1. UTI 2. Acute encephalopathy. Suspect multifactorial including UTI, Xanax, Lortab. Possible post concussion syndrome. 3. Falls at home. Suspect related to Xanax and Lortab.also consider gabapentin but less likely. 4. Misuse of hydrocodone and Xanax.family reports this has been corrected, they now controlled medication administration. Follow-up appointment with PCP already scheduled less than one week at which time they plan to request a decreased dosage of Xanax. 5. Diabetes mellitus. 6. Fibromyalgia, chronic pain   Plan observation, treat UTI, decrease Xanax dose (avoid withdrawal), continue Lortab dose which has not changed for some time. Monitor encephalopathy, gradual improvement anticipated. No focal neurologic complaints or deficits noted. No history to suggest CNS process.  Sliding scale insulin  Check orthostatics in the morning  PT consultation  Anticipate discharge 11/12. Discussed with daughter at bedside.  Code Status: full code DVT prophylaxis: SCDs Family Communication:  Disposition Plan/Anticipated LOS: obs  Time spent: 62  minutes  Murray Hodgkins, MD  [facility] Pager [phone] 07/13/2014, 11:46 AM

## 2014-07-13 NOTE — ED Notes (Signed)
MD at bedside. 

## 2014-07-14 DIAGNOSIS — G8929 Other chronic pain: Secondary | ICD-10-CM | POA: Diagnosis present

## 2014-07-14 DIAGNOSIS — Y92009 Unspecified place in unspecified non-institutional (private) residence as the place of occurrence of the external cause: Secondary | ICD-10-CM | POA: Diagnosis not present

## 2014-07-14 DIAGNOSIS — K219 Gastro-esophageal reflux disease without esophagitis: Secondary | ICD-10-CM | POA: Diagnosis present

## 2014-07-14 DIAGNOSIS — W01198A Fall on same level from slipping, tripping and stumbling with subsequent striking against other object, initial encounter: Secondary | ICD-10-CM | POA: Diagnosis present

## 2014-07-14 DIAGNOSIS — R296 Repeated falls: Secondary | ICD-10-CM | POA: Diagnosis present

## 2014-07-14 DIAGNOSIS — S0083XA Contusion of other part of head, initial encounter: Secondary | ICD-10-CM | POA: Diagnosis present

## 2014-07-14 DIAGNOSIS — N390 Urinary tract infection, site not specified: Secondary | ICD-10-CM | POA: Diagnosis present

## 2014-07-14 DIAGNOSIS — E119 Type 2 diabetes mellitus without complications: Secondary | ICD-10-CM | POA: Diagnosis present

## 2014-07-14 DIAGNOSIS — T424X5A Adverse effect of benzodiazepines, initial encounter: Secondary | ICD-10-CM | POA: Diagnosis present

## 2014-07-14 DIAGNOSIS — Z87891 Personal history of nicotine dependence: Secondary | ICD-10-CM | POA: Diagnosis not present

## 2014-07-14 DIAGNOSIS — G92 Toxic encephalopathy: Secondary | ICD-10-CM | POA: Diagnosis present

## 2014-07-14 DIAGNOSIS — F0781 Postconcussional syndrome: Secondary | ICD-10-CM | POA: Diagnosis present

## 2014-07-14 DIAGNOSIS — F419 Anxiety disorder, unspecified: Secondary | ICD-10-CM | POA: Diagnosis present

## 2014-07-14 DIAGNOSIS — Z9181 History of falling: Secondary | ICD-10-CM | POA: Diagnosis not present

## 2014-07-14 DIAGNOSIS — I10 Essential (primary) hypertension: Secondary | ICD-10-CM | POA: Diagnosis present

## 2014-07-14 DIAGNOSIS — T391X5A Adverse effect of 4-Aminophenol derivatives, initial encounter: Secondary | ICD-10-CM | POA: Diagnosis present

## 2014-07-14 DIAGNOSIS — E785 Hyperlipidemia, unspecified: Secondary | ICD-10-CM | POA: Diagnosis present

## 2014-07-14 DIAGNOSIS — R410 Disorientation, unspecified: Secondary | ICD-10-CM | POA: Diagnosis present

## 2014-07-14 DIAGNOSIS — M797 Fibromyalgia: Secondary | ICD-10-CM | POA: Diagnosis present

## 2014-07-14 DIAGNOSIS — M1990 Unspecified osteoarthritis, unspecified site: Secondary | ICD-10-CM | POA: Diagnosis present

## 2014-07-14 LAB — GLUCOSE, CAPILLARY
GLUCOSE-CAPILLARY: 157 mg/dL — AB (ref 70–99)
Glucose-Capillary: 134 mg/dL — ABNORMAL HIGH (ref 70–99)
Glucose-Capillary: 154 mg/dL — ABNORMAL HIGH (ref 70–99)
Glucose-Capillary: 120 mg/dL — ABNORMAL HIGH (ref 70–99)

## 2014-07-14 LAB — BASIC METABOLIC PANEL
ANION GAP: 13 (ref 5–15)
BUN: 10 mg/dL (ref 6–23)
CALCIUM: 9.3 mg/dL (ref 8.4–10.5)
CO2: 27 mEq/L (ref 19–32)
Chloride: 104 mEq/L (ref 96–112)
Creatinine, Ser: 0.8 mg/dL (ref 0.50–1.10)
GFR calc Af Amer: 80 mL/min — ABNORMAL LOW (ref >90)
GFR calc non Af Amer: 69 mL/min — ABNORMAL LOW (ref >90)
Glucose, Bld: 125 mg/dL — ABNORMAL HIGH (ref 70–99)
POTASSIUM: 4 meq/L (ref 3.7–5.3)
Sodium: 144 mEq/L (ref 137–147)

## 2014-07-14 MED ORDER — MIRTAZAPINE 30 MG PO TABS
15.0000 mg | ORAL_TABLET | Freq: Every day | ORAL | Status: DC
  Administered 2014-07-14: 15 mg via ORAL
  Filled 2014-07-14: qty 1

## 2014-07-14 MED ORDER — CIPROFLOXACIN HCL 250 MG PO TABS
500.0000 mg | ORAL_TABLET | Freq: Two times a day (BID) | ORAL | Status: DC
  Administered 2014-07-15: 500 mg via ORAL
  Filled 2014-07-14: qty 2

## 2014-07-14 MED ORDER — CIPROFLOXACIN HCL 500 MG PO TABS: 500.0000 mg | ORAL_TABLET | Freq: Two times a day (BID) | ORAL | Status: DC

## 2014-07-14 MED ORDER — GABAPENTIN 100 MG PO CAPS
100.0000 mg | ORAL_CAPSULE | Freq: Three times a day (TID) | ORAL | Status: DC
  Administered 2014-07-14 – 2014-07-15 (×2): 100 mg via ORAL
  Filled 2014-07-14 (×2): qty 1

## 2014-07-14 NOTE — Plan of Care (Signed)
Problem: Phase I Progression Outcomes Goal: Hemodynamically stable Outcome: Progressing     

## 2014-07-14 NOTE — Evaluation (Signed)
Physical Therapy Evaluation Patient Details Name: Laurie Mcfarland MRN: [id number] DOB: 1935/07/14 Today's Date: 07/14/2014   History of Present Illness  79 year old woman presented to the emergency department with history of fall at home last evening. Initial evaluation suggested UTI, no trauma. Noted to be confused.  History obtained from patient as well as daughter at bedside. Patient takes Xanax 2 mg 4 times a day as well as Lortab 10 mg 4 times a day. Family noted this made the patient quite somnolent and therefore in the last 2 weeks they have decreased Xanax dose by half. Lortab dose has not changed. Patient has had multiple falls over the last 2 weeks. Patient reports last evening she got up, seemed to loose her balance and fell, striking her left face against the carpet. No syncope or loss of consciousness. She denies any other musculoskeletal complaints. Review of ED documentation "pt's home health nurse says pt is taking more hydrocodone and xanax than prescribed".daughter reports she has full control of medications now.  Clinical Impression  Pt is a 79 year old female who presents to PT after a fall at home.  Pt reports she has had ~6 falls in the past month, and has recently began using a std cane for community gait.  During evaluation, pt was mod (I) with bed mobility skills and transfers, and supervision/min guard for gait with use of std cane.  DGI performed with score of 19, indicating pt is a fall risk.  Recommended to pt/family to speak with PCP on Monday (when pt has a regularly scheduled appt) for referral for OPPT services to address balance issues.  Pt to be d/c from acute PT services.  No DME recommendations.     Follow Up Recommendations Outpatient PT    Equipment Recommendations  None recommended by PT       Precautions / Restrictions Precautions Precautions: Fall Restrictions Weight Bearing Restrictions: No      Mobility  Bed Mobility Overal bed mobility: Modified  Independent                Transfers Overall transfer level: Modified independent                  Ambulation/Gait Ambulation/Gait assistance: Supervision;Min guard Ambulation Distance (Feet): 450 Feet Assistive device: Straight cane Gait Pattern/deviations: Step-through pattern;Decreased dorsiflexion - right;Decreased dorsiflexion - left   Gait velocity interpretation: Below normal speed for age/gender       Balance Overall balance assessment: History of Falls (Pt reports hx of fall 6/month)                               Standardized Balance Assessment Standardized Balance Assessment : Dynamic Gait Index   Dynamic Gait Index Level Surface: Normal Change in Gait Speed: Normal Gait with Horizontal Head Turns: Moderate Impairment Gait with Vertical Head Turns: Mild Impairment Gait and Pivot Turn: Mild Impairment Step Over Obstacle: Mild Impairment Step Around Obstacles: Normal Steps: Normal Total Score: 19       Pertinent Vitals/Pain Pain Assessment: No/denies pain    Home Living Family/patient expects to be discharged to:: Private residence Living Arrangements: Alone Available Help at Discharge: Family;Available PRN/intermittently Type of Home: House Home Access: Stairs to enter Entrance Stairs-Rails: None Entrance Stairs-Number of Steps: 1 small step Home Layout: One level Home Equipment: Walker - 2 wheels;Cane - single point;Grab bars - tub/shower;Shower seat Additional Comments: Charity fundraiser  Level of Independence: Independent with assistive device(s)         Comments: Pt reports mod (I) with bed mobility skills, transfers, and ambulation.  Pt reports furniture walking in the home, and use of std cane for community amb. Pt reports (I) with ADLs.      Hand Dominance   Dominant Hand: Right    Extremity/Trunk Assessment               Lower Extremity Assessment: Overall WFL for tasks assessed       Cervical / Trunk Assessment:  (Noted bruising from fall to Lt > Rt eye)  Communication   Communication: No difficulties  Cognition Arousal/Alertness: Awake/alert Behavior During Therapy: WFL for tasks assessed/performed Overall Cognitive Status: Within Functional Limits for tasks assessed                               Assessment/Plan    PT Assessment All further PT needs can be met in the next venue of care  PT Diagnosis Generalized weakness;Other (comment) (Balance)   PT Problem List Decreased strength;Decreased balance  PT Treatment Interventions     PT Goals (Current goals can be found in the Care Plan section) Acute Rehab PT Goals PT Goal Formulation: All assessment and education complete, DC therapy     End of Session Equipment Utilized During Treatment: Gait belt Activity Tolerance: Patient tolerated treatment well Patient left: in bed;with call bell/phone within reach;with bed alarm set;with family/visitor present      Functional Assessment Tool Used: Clinical Judgement Functional Limitation: Mobility: Walking and moving around Mobility: Walking and Moving Around Current Status (K1601): At least 1 percent but less than 20 percent impaired, limited or restricted Mobility: Walking and Moving Around Goal Status [phone]): 0 percent impaired, limited or restricted Mobility: Walking and Moving Around Discharge Status [phone]): At least 1 percent but less than 20 percent impaired, limited or restricted    Time: 0820-0850 PT Time Calculation (min) (ACUTE ONLY): 30 min   Charges:   PT Evaluation $Initial PT Evaluation Tier I: 1 Procedure PT Treatments $Gait Training: 8-22 mins   PT G Codes:   Functional Assessment Tool Used: Clinical Judgement Functional Limitation: Mobility: Walking and moving around    [facility] 07/14/2014, 8:57 AM

## 2014-07-14 NOTE — Care Management Note (Signed)
    Page 1 of 1   07/14/2014     11:20:33 AM CARE MANAGEMENT NOTE 07/14/2014  Patient:  LOUISA, FAVARO   Account Number:  [id number]  Date Initiated:  07/14/2014  Documentation initiated by:  Theophilus Kinds  Subjective/Objective Assessment:   Pt admitted from home with UTI. Pt lives alone and will return home at discharge. Pts daughter and grandson are with pt daily.  Pt has been independent with ADL's. Pt has a cane for prn use.     Action/Plan:   PT recommends outpt PT. Pt is agreeable. No DME needs noted.   Anticipated DC Date:  07/15/2014   Anticipated DC Plan:  [place]  CM consult      Choice offered to / List presented to:             Status of service:  Completed, signed off Medicare Important Message given?   (If response is "NO", the following Medicare IM given date fields will be blank) Date Medicare IM given:   Medicare IM given by:   Date Additional Medicare IM given:   Additional Medicare IM given by:    Discharge Disposition:  HOME/SELF CARE  Per UR Regulation:    If discussed at Long Length of Stay Meetings, dates discussed:    Comments:  07/14/14 Hilda, RN BSN CM

## 2014-07-14 NOTE — Progress Notes (Signed)
  PROGRESS NOTE  Laurie Mcfarland CNO:[id number] DOB: Jan 01, 1935 DOA: 07/13/2014 PCP: Glo Herring., MD  Summary: 79 year old woman presented to the emergency department with history of fall at home last evening. Initial evaluation suggested UTI, no trauma. Noted to be confused.  Assessment/Plan: 1. UTI clinically improving. Culture pending. 2. Acute encephalopathy. Resolved. Multifactorial: Xanax, UTI, Lortab. Also possible postconcussion syndrome. 3. Multiple falls at home. Likely related to Xanax and Lortab. 4. History of unintentional misuse of hydrocodone and Xanax. Family now controls medication administration. 5. Diabetes mellitus type 2. Stable. 6. Fibromyalgia.   Overall much improved. Transition to oral antibiotics in the morning and likely discharge home.  Discussed in detail with grandson and granddaughter-in-law at bedside, reviewed her medications and treatment strategies.  Code Status: full code DVT prophylaxis: Lovenox Family Communication:  Disposition Plan: home with outpatient PT  Murray Hodgkins, MD  [facility]  Pager [phone] If 7PM-7AM, please contact night-coverage at [email or web address], password [facility] 07/14/2014, 6:09 PM  LOS: 1 day   Consultants:  Physical therapy: Outpatient PT.  Procedures:    Antibiotics:  Ciprofloxacin 11/11 >> 11/17  HPI/Subjective: Orthostatics were negative.  Feels much better today. No complaints. Tolerating diet.  Objective: Filed Vitals:   07/13/14 2105 07/14/14 0438 07/14/14 0751 07/14/14 1415  BP: 140/57 151/70 156/73 155/74  Pulse: 92 86 79 95  Temp: 98.3 F (36.8 C) 97.8 F (36.6 C) 98 F (36.7 C) 98 F (36.7 C)  TempSrc: Oral Oral Oral   Resp: 18 18 18 18   Height:      Weight:      SpO2: 94% 94% 94% 97%    Intake/Output Summary (Last 24 hours) at 07/14/14 1809 Last data filed at 07/14/14 1200  Gross per 24 hour  Intake    480 ml  Output   1050 ml  Net   -570 ml     Filed Weights   07/13/14 0946 07/13/14 1251  Weight: 86.183 kg (190 lb) 89.495 kg (197 lb 4.8 oz)    Exam:     Afebrile, vital signs stable. Orthostatics negative.  General:appears calm, comfortable.  Psych: alert. Speech fluent and clear.oriented to self, location, month, president.  Eyes: bruising around eyes and forehead noted  CV: regular rate and rhythm. No murmur, rub or gallop. No lower extremity edema.  Respiratory: clear to auscultation bilaterally. No wheezes, rales or rhonchi. Normal respiratory effort.  Data Reviewed:  Urine output 963  Capillary blood sugar stable  Basic metabolic panel unremarkable  Urine culture pending  Scheduled Meds: . ciprofloxacin  400 mg Intravenous Q12H  . insulin aspart  0-9 Units Subcutaneous TID WC  . pantoprazole  40 mg Oral Daily  . potassium chloride SA  20 mEq Oral q morning - 10a  . simvastatin  20 mg Oral QPM  . sodium chloride  3 mL Intravenous Q12H   Continuous Infusions:   Principal Problem:   UTI (lower urinary tract infection) Active Problems:   Confusion   Acute encephalopathy   Fall at home   DM type 2 (diabetes mellitus, type 2)   Time spent 35 minutes greater than 50% in counseling and coordination of care       \

## 2014-07-14 NOTE — Progress Notes (Signed)
UR completed 

## 2014-07-14 NOTE — Progress Notes (Signed)
Patient ambulated in hall with Nurse Tech.

## 2014-07-14 NOTE — Plan of Care (Signed)
Problem: Phase I Progression Outcomes Goal: Adequate I & O Outcome: Completed/Met Date Met:  07/14/14

## 2014-07-14 NOTE — Plan of Care (Signed)
Problem: Phase I Progression Outcomes Goal: Vital Signs stable- temperature less than 102 Outcome: Completed/Met Date Met:  07/14/14

## 2014-07-15 LAB — GLUCOSE, CAPILLARY: GLUCOSE-CAPILLARY: 130 mg/dL — AB (ref 70–99)

## 2014-07-15 MED ORDER — CIPROFLOXACIN HCL 500 MG PO TABS: 500.0000 mg | ORAL_TABLET | Freq: Two times a day (BID) | ORAL | Status: DC

## 2014-07-15 MED ORDER — HYDROCODONE-ACETAMINOPHEN 10-325 MG PO TABS: 1.0000 | ORAL_TABLET | Freq: Four times a day (QID) | ORAL | Status: DC | PRN

## 2014-07-15 NOTE — Progress Notes (Signed)
Patient discharged with instructions, prescription, and care notes.  Verbalized understanding via teach back.  IV was removed and the site was WNL. Patient voiced no further complaints or concerns at the time of discharge.  Appointments scheduled per instructions.  Patient left the floor via w/c with staff and family in stable condition. 

## 2014-07-15 NOTE — Progress Notes (Signed)
Bed alarm going off. Pt has climbed over side rail of bed and attempting to put shoes on. Unsteady gait and confusion noted. Pt convinced to go back to bed. Nurse sitting at bedside for as long as she can. Will continue to monitor.

## 2014-07-15 NOTE — Progress Notes (Signed)
PROGRESS NOTE  Laurie Mcfarland LKT:[id number] DOB: 24-May-1935 DOA: 07/13/2014 PCP: Glo Herring., MD  Summary: 79 year old woman presented to the emergency department with history of fall at home last evening. Initial evaluation suggested UTI, no trauma. Noted to be confused.  Assessment/Plan: 1. UTI appears clinically resolved. Culture still pending. 2. Acute encephalopathy. Appears resolved, mentally appears a baseline this morning. Some apparent confusion last night although the patient does recall these events. Multifactorial: Xanax, UTI, Lortab. Also possible postconcussion syndrome.I have asked the patient not take Xanax or pain medication before bed. I discussed this in detail with son and daughter-in-law yesterday. 3. Multiple falls at home. Likely related to Xanax and Lortab. As above recommend modifications to this. I would strongly recommend weaning off Xanax in the 79 year old woman. 4. History of unintentional misuse of hydrocodone and Xanax. Family now controls medication administration. 5. Diabetes mellitus type 2. Remained stable. 6. Fibromyalgia.   Overall much improved with resolution of encephalopathy and clinical resolution of UTI. Plan discharge home with outpatient physical therapy.  I discussed medications in detail with family members at bedside last evening, we will discontinue fish oil at this time as it may be causing her diarrhea and multiple back which may be causing her long standing GI upset.  I strongly recommended weaning off Xanax under the close supervision of her physician. She and family are aware not to stop Xanax abruptly. I have also recommended not taking Xanax her pain medications near bedtime. They are aware that Xanax and pain medication can cause confusion and falls.  She has close follow-up arranged with her PCP 11/16  Murray Hodgkins, MD  [facility]  Pager [phone] If 7PM-7AM, please contact night-coverage at [email or web address], password  [facility] 07/15/2014, 7:27 AM  LOS: 2 days   Consultants:  Physical therapy: Outpatient PT.  Procedures:    Antibiotics:  Ciprofloxacin 11/11 >> 11/17  HPI/Subjective: Some confusion overnight with attempts to get out of bed. Of note the patient was given Xanax at 2 in the morning as well as Lortab before bedtime  Feels good this morning. No pain, no complaints. When asked about last night she reports "I tried to get out of bed several times but they made me get back in bed. I just want to go home."  Objective: Filed Vitals:   07/14/14 0751 07/14/14 1415 07/14/14 2136 07/15/14 0500  BP: 156/73 155/74 135/84 161/68  Pulse: 79 95 91 84  Temp: 98 F (36.7 C) 98 F (36.7 C) 98.5 F (36.9 C) 98 F (36.7 C)  TempSrc: Oral  Oral Oral  Resp: 18 18 20 19   Height:      Weight:      SpO2: 94% 97%  98%    Intake/Output Summary (Last 24 hours) at 07/15/14 0727 Last data filed at 07/14/14 1200  Gross per 24 hour  Intake    480 ml  Output    400 ml  Net     80 ml     Filed Weights   07/13/14 0946 07/13/14 1251  Weight: 86.183 kg (190 lb) 89.495 kg (197 lb 4.8 oz)    Exam:     Afebrile, vital signs stable. No hypoxia.  Gen. Appears calm, comfortable. Speech fluent and clear.  Psychiatric. Oriented to self, location, month, president. She recalls independently the reason for hospitalization.  Cardiovascular regular rate and rhythm. No murmur, rub or gallop. No lower extremity edema.  Respiratory clear to auscultation bilaterally. No wheezes, rales or rhonchi. Normal respiratory effort.  Moves all extremities well. Cranial nerves appear grossly intact.  Progressive bruising of forehead and face asymptomatic.  Data Reviewed:  Capillary blood sugars stable   Urine culture still pending  Scheduled Meds: . ciprofloxacin  500 mg Oral BID  . gabapentin  100 mg Oral TID  . insulin aspart  0-9 Units Subcutaneous TID WC  . mirtazapine  15 mg Oral QHS  . pantoprazole  40  mg Oral Daily  . potassium chloride SA  20 mEq Oral q morning - 10a  . simvastatin  20 mg Oral QPM  . sodium chloride  3 mL Intravenous Q12H   Continuous Infusions:   Principal Problem:   UTI (lower urinary tract infection) Active Problems:   Confusion   Acute encephalopathy   Fall at home   DM type 2 (diabetes mellitus, type 2)          \

## 2014-07-15 NOTE — Progress Notes (Signed)
Bed alarm going off. Pt attempting to climb out of bed without supervision. Confusion noted. Pt easily convinced to go back to bed. Bed alarm on, bed in lowest position. She verbalizes no pain or discomfort. Will continue to monitor.

## 2014-07-15 NOTE — Discharge Summary (Signed)
Physician Discharge Summary  Laurie Mcfarland:[id number] DOB: 1935/06/06 DOA: 07/13/2014  PCP: Glo Herring., MD  Admit date: 07/13/2014 Discharge date: 07/15/2014  Recommendations for Outpatient Follow-up:  1. Resolution of UTI. Urine culture pending. 2. Recurrent falls at home and encephalopathy likely related to Xanax and Lortab. Consider weaning off these medications. 3. Outpatient physical therapy   Follow-up Information    Follow up with Glo Herring., MD.   Specialty:  Internal Medicine   Why:  keep you appointment on Monday   Contact information:   [address] [place] [address] [phone]      Discharge Diagnoses:  1. UTI 2. Acute encephalopathy likely secondary to Xanax, Lortab and UTI 3. History of multiple falls at home 4. Diabetes mellitus type 2 5. Fibromyalgia  Discharge Condition: improved Disposition: home with outpatient physical therapy  Diet recommendation: heart healthy diabetic diet  Filed Weights   07/13/14 0946 07/13/14 1251  Weight: 86.183 kg (190 lb) 89.495 kg (197 lb 4.8 oz)    History of present illness:  79 year old woman presented to the emergency department with history of fall at home last evening. Initial evaluation suggested UTI, no trauma; also taking too much Xanax and Lortab. Noted to be confused.  Hospital Course:  She was admitted to the hospital, treated with empiric antibiotics for UTI and Xanax) Lortab administration was decreased. Encephalopathy rapidly resolved and hospitalization was uncomplicated. See individual issues below especially regard to Xanax, Lortab.  1. UTI appears clinically resolved. Culture still pending. 2. Acute encephalopathy. resolved. Multifactorial: Xanax, UTI, Lortab. Also possible postconcussion syndrome. CT of head and face negative. I have asked the patient not take Xanax or pain medication before bed. I discussed this in detail with son and daughter-in-law. 3. Multiple falls  at home. Likely related to Xanax and Lortab. As above recommend modifications to this. I would strongly recommend weaning off Xanax under the care of her physician. 4. History of unintentional misuse of hydrocodone and Xanax. Family now controls medication administration. 5. Diabetes mellitus type 2. Remained stable. 6. Fibromyalgia.   Overall much improved with resolution of encephalopathy and clinical resolution of UTI. Plan discharge home with outpatient physical therapy.  I discussed medications in detail with family members, we will discontinue fish oil at this time as it may be causing her diarrhea and Mobic which may be causing her long standing GI upset.  I strongly recommended weaning off Xanax under the close supervision of her physician. She and family are aware not to stop Xanax abruptly. I have also recommended not taking Xanax or pain medications near bedtime. They are aware that Xanax and pain medication can cause confusion and falls.  She has close follow-up arranged with her PCP 11/16  Consultants:  Physical therapy: Outpatient PT.  Procedures:  none  Antibiotics:  Ciprofloxacin 11/11 >> 11/17  Discharge Instructions  Discharge Instructions    Diet - low sodium heart healthy    Complete by:  As directed      Discharge instructions    Complete by:  As directed   Call your physician or seek immediate medical attention for fever, confusion, falls, worsening of condition. Please discuss proper use of Xanax and pain medication with your physician. It is recommended that you decrease your use of Xanax and pain medication under the supervision of your physician.     Increase activity slowly    Complete by:  As directed           Current Discharge  Medication List    START taking these medications   Details  ciprofloxacin (CIPRO) 500 MG tablet Take 1 tablet (500 mg total) by mouth 2 (two) times daily. Qty: 10 tablet, Refills: 0      CONTINUE these medications  which have CHANGED   Details  HYDROcodone-acetaminophen (NORCO) 10-325 MG per tablet Take 1 tablet by mouth every 6 (six) hours as needed for moderate pain.      CONTINUE these medications which have NOT CHANGED   Details  amLODipine (NORVASC) 10 MG tablet Take 10 mg by mouth daily.     aspirin EC 81 MG tablet Take 81 mg by mouth daily as needed (chest pain).     Cholecalciferol (VITAMIN D-3) 1000 UNITS CAPS Take 1 capsule by mouth 2 (two) times daily.     furosemide (LASIX) 20 MG tablet Take 20 mg by mouth daily as needed for fluid or edema.     gabapentin (NEURONTIN) 100 MG capsule Take 100 mg by mouth 3 (three) times daily.     Ginkgo Biloba 60 MG CAPS Take 1 capsule by mouth daily.     metFORMIN (GLUCOPHAGE) 500 MG tablet Take 500 mg by mouth 2 (two) times daily with a meal.     Misc Natural Products (OSTEO BI-FLEX ADV DOUBLE ST PO) Take 1 tablet by mouth every morning.     Multiple Vitamin (MULTIVITAMIN WITH MINERALS) TABS tablet Take 1 tablet by mouth daily.    multivitamin-lutein (OCUVITE-LUTEIN) CAPS Take 1 capsule by mouth every morning.     omeprazole (PRILOSEC) 40 MG capsule Take 40 mg by mouth daily.    Probiotic Product (TRUBIOTICS) CAPS Take 1 capsule by mouth every morning.     simvastatin (ZOCOR) 20 MG tablet Take 20 mg by mouth every evening.    ALPRAZolam (XANAX) 1 MG tablet Take 1 mg by mouth 4 (four) times daily as needed. Nerves    mirtazapine (REMERON) 15 MG tablet Take 15 mg by mouth at bedtime.    potassium chloride SA (K-DUR,KLOR-CON) 20 MEQ tablet Take 20 mEq by mouth every morning.     vitamin C (ASCORBIC ACID) 500 MG tablet Take 500 mg by mouth every morning.       STOP taking these medications     fish oil-omega-3 fatty acids 1000 MG capsule      meloxicam (MOBIC) 7.5 MG tablet      HYDROcodone-acetaminophen (LORCET) 10-650 MG per tablet        Allergies  Allergen Reactions  . Penicillins Rash    The results of significant  diagnostics from this hospitalization (including imaging, microbiology, ancillary and laboratory) are listed below for reference.    Significant Diagnostic Studies: Ct Head Wo Contrast  07/13/2014   CLINICAL DATA:  [place] last night in living room after getting up from watching television, struck head, bruising LEFT frontal region, personal history hypertension, diabetes mellitus  EXAM: CT HEAD WITHOUT CONTRAST  CT MAXILLOFACIAL WITHOUT CONTRAST  TECHNIQUE: Multidetector CT imaging of the head and maxillofacial structures were performed using the standard protocol without intravenous contrast. Multiplanar CT image reconstructions of the maxillofacial structures were also generated.  COMPARISON:  CT head 12/24/2005  FINDINGS: CT HEAD FINDINGS  Generalized atrophy.  Normal ventricular morphology.  No midline shift or mass effect.  No intracranial hemorrhage, mass lesion, or evidence acute infarction.  No definite extra-axial fluid collections.  Frontal scalp hematoma greater on LEFT.  Atherosclerotic calcification at carotid siphons.  Calvaria intact.  CT MAXILLOFACIAL FINDINGS  Visualized intracranial structures unremarkable.  Frontal scalp hematoma greater on LEFT, extending LEFT temporal.  Intraorbital soft tissue planes clear.  Calvaria intact.  Facial bones intact.  Paranasal sinuses, mastoid air cells and middle ear cavities clear.  Visualize cervical spine intact with degenerative disc disease changes at C4-C5 and C5-C6.  Nasal septum midline.  IMPRESSION: No acute intracranial abnormalities.  Frontal scalp hematoma greater on LEFT and extending LEFT temporal.  No acute facial bone abnormalities.   Electronically Signed   By: Lavonia Dana M.D.   On: 07/13/2014 11:01   Ct Maxillofacial Wo Cm  07/13/2014   CLINICAL DATA:  [place] last night in living room after getting up from watching television, struck head, bruising LEFT frontal region, personal history hypertension, diabetes mellitus  EXAM: CT HEAD WITHOUT  CONTRAST  CT MAXILLOFACIAL WITHOUT CONTRAST  TECHNIQUE: Multidetector CT imaging of the head and maxillofacial structures were performed using the standard protocol without intravenous contrast. Multiplanar CT image reconstructions of the maxillofacial structures were also generated.  COMPARISON:  CT head 12/24/2005  FINDINGS: CT HEAD FINDINGS  Generalized atrophy.  Normal ventricular morphology.  No midline shift or mass effect.  No intracranial hemorrhage, mass lesion, or evidence acute infarction.  No definite extra-axial fluid collections.  Frontal scalp hematoma greater on LEFT.  Atherosclerotic calcification at carotid siphons.  Calvaria intact.  CT MAXILLOFACIAL FINDINGS  Visualized intracranial structures unremarkable.  Frontal scalp hematoma greater on LEFT, extending LEFT temporal.  Intraorbital soft tissue planes clear.  Calvaria intact.  Facial bones intact.  Paranasal sinuses, mastoid air cells and middle ear cavities clear.  Visualize cervical spine intact with degenerative disc disease changes at C4-C5 and C5-C6.  Nasal septum midline.  IMPRESSION: No acute intracranial abnormalities.  Frontal scalp hematoma greater on LEFT and extending LEFT temporal.  No acute facial bone abnormalities.   Electronically Signed   By: Lavonia Dana M.D.   On: 07/13/2014 11:01     Labs: Basic Metabolic Panel:  Recent Labs Lab 07/13/14 1023 07/14/14 0535  NA 142 144  K 4.3 4.0  CL 100 104  CO2 30 27  GLUCOSE 125* 125*  BUN 20 10  CREATININE 0.85 0.80  CALCIUM 9.4 9.3   Liver Function Tests:  Recent Labs Lab 07/13/14 1023  AST 22  ALT 14  ALKPHOS 63  BILITOT 0.3  PROT 7.8  ALBUMIN 3.7    CBC:  Recent Labs Lab 07/13/14 1023  WBC 8.0  NEUTROABS 5.0  HGB 12.0  HCT 37.2  MCV 93.5  PLT 226   Cardiac Enzymes:  Recent Labs Lab 07/13/14 1023  TROPONINI <0.30   CBG:  Recent Labs Lab 07/14/14 0755 07/14/14 1116 07/14/14 1647 07/14/14 2003 07/15/14 0747  GLUCAP 134* 154*  120* 157* 130*    Principal Problem:   UTI (lower urinary tract infection) Active Problems:   Confusion   Acute encephalopathy   Fall at home   DM type 2 (diabetes mellitus, type 2)   Time coordinating discharge: 35 minutes  Signed:  Murray Hodgkins, MD [facility] 07/15/2014, 8:19 AM

## 2014-07-15 NOTE — Discharge Instructions (Signed)

## 2014-07-15 NOTE — Progress Notes (Signed)
Bed alarm going off. Pt is confused and agitated. States she wants to go home. Pt is medicated for anxiety and convinced to go back to bed. Will continue to monitor.

## 2014-07-16 LAB — URINE CULTURE: Colony Count: >=100000

## 2014-08-30 ENCOUNTER — Encounter: Payer: Self-pay | Admitting: Neurology

## 2014-08-30 ENCOUNTER — Ambulatory Visit (INDEPENDENT_AMBULATORY_CARE_PROVIDER_SITE_OTHER): Payer: Medicare Other | Admitting: Neurology

## 2014-08-30 VITALS — BP 130/70 | HR 100 | Ht 62.0 in | Wt 185.0 lb

## 2014-08-30 DIAGNOSIS — E785 Hyperlipidemia, unspecified: Secondary | ICD-10-CM

## 2014-08-30 DIAGNOSIS — I10 Essential (primary) hypertension: Secondary | ICD-10-CM

## 2014-08-30 DIAGNOSIS — E119 Type 2 diabetes mellitus without complications: Secondary | ICD-10-CM

## 2014-08-30 DIAGNOSIS — R413 Other amnesia: Secondary | ICD-10-CM

## 2014-08-30 NOTE — Patient Instructions (Signed)
1. Physical exercise and brain stimulation exercises are important for brain health 2. Follow-up in 6 months

## 2014-08-30 NOTE — Progress Notes (Addendum)
NEUROLOGY CONSULTATION NOTE  [name] MRN: [id number] DOB: [birth year]  Referring provider: Dr. Redmond Mcfarland Primary care provider: Dr. Redmond Mcfarland  Reason for consult:  Memory loss after fall  Dear Laurie Mcfarland:  Thank you for your kind referral of Laurie Mcfarland for consultation of the above symptoms. Although her history is well known to you, please allow me to reiterate it for the purpose of our medical record. The patient was accompanied to the clinic by her daughter who also provides collateral information. Records and images were personally reviewed where available.  HISTORY OF PRESENT ILLNESS: This is a 79 year old right-handed woman with a history of diabetes, hypertension, hyperlipidemia, fibromyalgia, anxiety, chronic pain, in her usual state of health until October 2015 when she started having recurrent falls and slurred speech. On 07/13/14, she recalls falling when she got up in the morning, hit her head, then went back to bed. When her family saw her in the morning, they saw she was bleeding from her head with 2 black eyes. She was confused after the fall and could not remember names. She was brought to [facility] ER where she was found to have a UTI. I personally reviewed head CT without contrast done in the ER which did not show any acute intracranial abnormalities, left frontal scalp hematoma. Confusion was felt to be due to UTI, in addition to medication (Xanax and Lortab). Since then, she has slowly returned to baseline. Her daughter reports that they did notice memory changes around the time of her confusion and falls, which has improved as well. Around a week prior to the fall, she went to have her hair done and went with dirty clothes and without her dentures. She missed a bill payment around that time. They feel she is now back to her baseline with no difficulties with ADLs. She takes gabapentin for back pain and feels that this helps her well. Her mother had Alzheimer's  dementia.  She denies any headaches, dizziness, diplopia, dysarthria, dysphagia, focal numbness/tingling/weakness, bowel/bladder dysfunction. She denies any tremors, no anosmia. She has chronic neck and back pain.  Laboratory Data: Lab Results  Component Value Date   WBC 8.0 07/13/2014   HGB 12.0 07/13/2014   HCT 37.2 07/13/2014   MCV 93.5 07/13/2014   PLT 226 07/13/2014     Chemistry      Component Value Date/Time   NA 144 07/14/2014 0535   NA 140 02/19/2013   K 4.0 07/14/2014 0535   K 4.2 02/19/2013   CL 104 07/14/2014 0535   CO2 27 07/14/2014 0535   BUN 10 07/14/2014 0535   BUN 17 02/19/2013   CREATININE 0.80 07/14/2014 0535   CREATININE 1.04 02/19/2013      Component Value Date/Time   CALCIUM 9.3 07/14/2014 0535   CALCIUM 9.4 02/19/2013   ALKPHOS 63 07/13/2014 1023   ALKPHOS 72 02/19/2013   AST 22 07/13/2014 1023   AST 25 02/19/2013   ALT 14 07/13/2014 1023   BILITOT 0.3 07/13/2014 1023   BILITOT 0.3 02/19/2013     12/2013: TSH 1.348, B12 723  PAST MEDICAL HISTORY: Past Medical History  Diagnosis Date  . Hypertension   . Shortness of breath     with exertion  . Diabetes mellitus   . GERD (gastroesophageal reflux disease)   . Anxiety   . Arthritis   . Hyperlipidemia   . Fibromyalgia   . Complication of anesthesia     pt. states that she remembers  parts of her surgery when she had her TAH    PAST SURGICAL HISTORY: Past Surgical History  Procedure Laterality Date  . Abdominal hysterectomy    . Foreign body removal      splinter removed from right side of face  . Cataract extraction w/phaco  12/10/2011    Procedure: CATARACT EXTRACTION PHACO AND INTRAOCULAR LENS PLACEMENT (IOC);  Surgeon: Elta Guadeloupe T. Gershon Crane, MD;  Location: [facility];  Service: Ophthalmology;  Laterality: Left;  CDE=10.72  . Cataract extraction w/phaco  01/14/2012    Procedure: CATARACT EXTRACTION PHACO AND INTRAOCULAR LENS PLACEMENT (IOC);  Surgeon: Elta Guadeloupe T. Gershon Crane, MD;  Location: [facility];   Service: Ophthalmology;  Laterality: Right;  CDE:11.63  . Esophagogastroduodenoscopy (egd) with esophageal dilation N/A 06/17/2013    ZOX:WRUEAV esophagus-status post [facility] dilation. Hiatal hernia. Gastric erosions s/p bx    MEDICATIONS: Current Outpatient Prescriptions on File Prior to Visit  Medication Sig Dispense Refill  . ALPRAZolam (XANAX) 1 MG tablet Take 1 mg by mouth 4 (four) times daily as needed. Nerves    . amLODipine (NORVASC) 10 MG tablet Take 10 mg by mouth daily.     Marland Kitchen aspirin EC 81 MG tablet Take 81 mg by mouth daily as needed (chest pain).     . Cholecalciferol (VITAMIN D-3) 1000 UNITS CAPS Take 1 capsule by mouth 2 (two) times daily.     . furosemide (LASIX) 20 MG tablet Take 20 mg by mouth daily as needed for fluid or edema.     . gabapentin (NEURONTIN) 100 MG capsule Take 100 mg by mouth 3 (three) times daily.     . Ginkgo Biloba 60 MG CAPS Take 1 capsule by mouth daily.     Marland Kitchen HYDROcodone-acetaminophen (NORCO) 10-325 MG per tablet Take 1 tablet by mouth every 6 (six) hours as needed for moderate pain.    . metFORMIN (GLUCOPHAGE) 500 MG tablet Take 500 mg by mouth 2 (two) times daily with a meal.     . mirtazapine (REMERON) 15 MG tablet Take 15 mg by mouth at bedtime.    . Misc Natural Products (OSTEO BI-FLEX ADV DOUBLE ST PO) Take 1 tablet by mouth every morning.     . Multiple Vitamin (MULTIVITAMIN WITH MINERALS) TABS tablet Take 1 tablet by mouth daily.    . multivitamin-lutein (OCUVITE-LUTEIN) CAPS Take 1 capsule by mouth every morning.     Marland Kitchen omeprazole (PRILOSEC) 40 MG capsule Take 40 mg by mouth daily.    . potassium chloride SA (K-DUR,KLOR-CON) 20 MEQ tablet Take 20 mEq by mouth every morning.     . Probiotic Product (TRUBIOTICS) CAPS Take 1 capsule by mouth every morning.     . simvastatin (ZOCOR) 20 MG tablet Take 20 mg by mouth every evening.    . vitamin C (ASCORBIC ACID) 500 MG tablet Take 500 mg by mouth every morning.      No current  facility-administered medications on file prior to visit.    ALLERGIES: Allergies  Allergen Reactions  . Penicillins Rash    FAMILY HISTORY: Family History  Problem Relation Age of Onset  . Pseudochol deficiency Neg Hx   . Malignant hyperthermia Neg Hx   . Hypotension Neg Hx   . Anesthesia problems Neg Hx   . Colon cancer Neg Hx   . Cancer Father   . Alzheimer's disease Mother   . Cancer Sister   . Cancer Brother     SOCIAL HISTORY: History   Social History  . Marital Status:  Widowed    Spouse Name: N/A    Number of Children: 4  . Years of Education: N/A   Occupational History  . Not on file.   Social History Main Topics  . Smoking status: Former Smoker    Quit date: 08/30/1994  . Smokeless tobacco: Not on file  . Alcohol Use: No  . Drug Use: No  . Sexual Activity: Not Currently   Other Topics Concern  . Not on file   Social History Narrative   Adopted granddaughter and raised great grandson.    REVIEW OF SYSTEMS: Constitutional: No fevers, chills, or sweats, no generalized fatigue, change in appetite Eyes: No visual changes, double vision, eye pain Ear, nose and throat: No hearing loss, ear pain, nasal congestion, sore throat Cardiovascular: No chest pain, palpitations Respiratory:  No shortness of breath at rest or with exertion, wheezes GastrointestinaI: No nausea, vomiting, diarrhea, abdominal pain, fecal incontinence Genitourinary:  No dysuria, urinary retention or frequency Musculoskeletal:  + neck pain, back pain Integumentary: No rash, pruritus, skin lesions Neurological: as above Psychiatric: No depression, insomnia, anxiety Endocrine: No palpitations, fatigue, diaphoresis, mood swings, change in appetite, change in weight, increased thirst Hematologic/Lymphatic:  No anemia, purpura, petechiae. Allergic/Immunologic: no itchy/runny eyes, nasal congestion, recent allergic reactions, rashes  PHYSICAL EXAM: Filed Vitals:   08/30/14 1242  BP:  130/70  Pulse: 100   General: No acute distress Head:  Normocephalic/atraumatic Eyes: Fundoscopic exam shows bilateral sharp discs, no vessel changes, exudates, or hemorrhages Neck: supple, no paraspinal tenderness, full range of motion Back: No paraspinal tenderness Heart: regular rate and rhythm Lungs: Clear to auscultation bilaterally. Vascular: No carotid bruits. Skin/Extremities: No rash, no edema Neurological Exam: Mental status: alert and oriented to person, place, and month/year, no dysarthria or aphasia, Fund of knowledge is appropriate.  Recent and remote memory are intact.  Attention and concentration are normal.    Able to name objects and repeat phrases.  MMSE - Mini Mental State Exam 08/30/2014  Orientation to time 4  Orientation to Place 5  Registration 3  Attention/ Calculation 4  Recall 1  Language- name 2 objects 2  Language- repeat 1  Language- follow 3 step command 2  Language- read & follow direction 1  Write a sentence 1  Copy design 1  Total score 25   Cranial nerves: CN I: not tested CN II: pupils equal, round and reactive to light, visual fields intact, fundi unremarkable. CN III, IV, VI:  full range of motion, no nystagmus, no ptosis CN V: facial sensation intact CN VII: upper and lower face symmetric CN VIII: hearing intact to finger rub CN IX, X: gag intact, uvula midline CN XI: sternocleidomastoid and trapezius muscles intact CN XII: tongue midline Bulk & Tone: normal, no fasciculations. Motor: 5/5 throughout with no pronator drift. Sensation: decreased vibration to left ankle, otherwise intact to light touch, cold, pin, and joint position sense.  No extinction to double simultaneous stimulation.  Romberg test negative Deep Tendon Reflexes: +2 on both UE, +1 on both LE, no ankle clonus Plantar responses: downgoing bilaterally Cerebellar: no incoordination on finger to nose, heel to shin. No dysdiadochokinesia Gait: narrow-based and steady, able  to tandem walk adequately. Tremor: none  IMPRESSION: This is a 79 year old right-handed woman with hypertension, hyperlipidemia, diabetes, fibromyalgia, anxiety, who had confusion and falls from October to November until she had a bigger fall on 07/13/14 and found to have a UTI. There was concern that medications (Xanax and Lortab) were  also contributing to the encephalopathy. Since treatment of the UTI, her daughter reports she is now back to baseline. MMSE today is 25/30. She feels gabapentin is helping significantly with her symptoms and would prefer to stay on this medication. We discussed the importance of physical exercise and brain stimulation exercises, control of vascular risk factors, for brain health. She will follow-up in 6 months.  Thank you for allowing me to participate in the care of this patient. Please do not hesitate to call for any questions or concerns.   Ellouise Newer, M.D.  CC: Laurie. Gerarda Mcfarland

## 2014-09-03 ENCOUNTER — Encounter: Payer: Self-pay | Admitting: Neurology

## 2014-09-03 DIAGNOSIS — R413 Other amnesia: Secondary | ICD-10-CM | POA: Insufficient documentation

## 2014-09-03 DIAGNOSIS — I10 Essential (primary) hypertension: Secondary | ICD-10-CM | POA: Insufficient documentation

## 2014-09-03 DIAGNOSIS — E785 Hyperlipidemia, unspecified: Secondary | ICD-10-CM | POA: Insufficient documentation

## 2014-09-03 DIAGNOSIS — E119 Type 2 diabetes mellitus without complications: Secondary | ICD-10-CM | POA: Insufficient documentation

## 2014-09-30 DIAGNOSIS — Z6833 Body mass index (BMI) 33.0-33.9, adult: Secondary | ICD-10-CM | POA: Diagnosis not present

## 2014-09-30 DIAGNOSIS — E119 Type 2 diabetes mellitus without complications: Secondary | ICD-10-CM | POA: Diagnosis not present

## 2014-09-30 DIAGNOSIS — G894 Chronic pain syndrome: Secondary | ICD-10-CM | POA: Diagnosis not present

## 2014-09-30 DIAGNOSIS — N390 Urinary tract infection, site not specified: Secondary | ICD-10-CM | POA: Diagnosis not present

## 2014-10-19 DIAGNOSIS — I10 Essential (primary) hypertension: Secondary | ICD-10-CM | POA: Diagnosis not present

## 2014-10-19 DIAGNOSIS — Z6834 Body mass index (BMI) 34.0-34.9, adult: Secondary | ICD-10-CM | POA: Diagnosis not present

## 2014-10-19 DIAGNOSIS — E6609 Other obesity due to excess calories: Secondary | ICD-10-CM | POA: Diagnosis not present

## 2014-10-19 DIAGNOSIS — G894 Chronic pain syndrome: Secondary | ICD-10-CM | POA: Diagnosis not present

## 2014-10-20 DIAGNOSIS — I10 Essential (primary) hypertension: Secondary | ICD-10-CM | POA: Diagnosis not present

## 2014-10-20 DIAGNOSIS — E6609 Other obesity due to excess calories: Secondary | ICD-10-CM | POA: Diagnosis not present

## 2014-10-20 DIAGNOSIS — G894 Chronic pain syndrome: Secondary | ICD-10-CM | POA: Diagnosis not present

## 2014-10-20 DIAGNOSIS — Z6834 Body mass index (BMI) 34.0-34.9, adult: Secondary | ICD-10-CM | POA: Diagnosis not present

## 2014-11-29 DIAGNOSIS — E119 Type 2 diabetes mellitus without complications: Secondary | ICD-10-CM | POA: Diagnosis not present

## 2014-11-29 DIAGNOSIS — Z961 Presence of intraocular lens: Secondary | ICD-10-CM | POA: Diagnosis not present

## 2014-12-02 DIAGNOSIS — N390 Urinary tract infection, site not specified: Secondary | ICD-10-CM | POA: Diagnosis not present

## 2014-12-23 DIAGNOSIS — E119 Type 2 diabetes mellitus without complications: Secondary | ICD-10-CM | POA: Diagnosis not present

## 2014-12-23 DIAGNOSIS — N390 Urinary tract infection, site not specified: Secondary | ICD-10-CM | POA: Diagnosis not present

## 2014-12-23 DIAGNOSIS — E6609 Other obesity due to excess calories: Secondary | ICD-10-CM | POA: Diagnosis not present

## 2014-12-23 DIAGNOSIS — M549 Dorsalgia, unspecified: Secondary | ICD-10-CM | POA: Diagnosis not present

## 2014-12-23 DIAGNOSIS — Z6833 Body mass index (BMI) 33.0-33.9, adult: Secondary | ICD-10-CM | POA: Diagnosis not present

## 2014-12-23 DIAGNOSIS — I10 Essential (primary) hypertension: Secondary | ICD-10-CM | POA: Diagnosis not present

## 2014-12-23 DIAGNOSIS — M7581 Other shoulder lesions, right shoulder: Secondary | ICD-10-CM | POA: Diagnosis not present

## 2015-02-03 ENCOUNTER — Ambulatory Visit (HOSPITAL_COMMUNITY)
Admission: RE | Admit: 2015-02-03 | Discharge: 2015-02-03 | Disposition: A | Discharge status: Home / Self Care | Payer: Medicare Other | Source: Ambulatory Visit | Attending: Urology | Admitting: Urology

## 2015-02-03 ENCOUNTER — Ambulatory Visit (INDEPENDENT_AMBULATORY_CARE_PROVIDER_SITE_OTHER): Payer: Medicare Other | Admitting: Urology

## 2015-02-03 ENCOUNTER — Other Ambulatory Visit: Payer: Self-pay | Admitting: Urology

## 2015-02-03 DIAGNOSIS — R3919 Other difficulties with micturition: Secondary | ICD-10-CM

## 2015-02-03 DIAGNOSIS — M79652 Pain in left thigh: Secondary | ICD-10-CM | POA: Diagnosis not present

## 2015-02-03 DIAGNOSIS — M549 Dorsalgia, unspecified: Secondary | ICD-10-CM

## 2015-02-03 DIAGNOSIS — M47816 Spondylosis without myelopathy or radiculopathy, lumbar region: Secondary | ICD-10-CM | POA: Diagnosis not present

## 2015-02-03 DIAGNOSIS — M545 Low back pain: Secondary | ICD-10-CM | POA: Diagnosis not present

## 2015-02-03 DIAGNOSIS — Z87440 Personal history of urinary (tract) infections: Secondary | ICD-10-CM

## 2015-02-03 DIAGNOSIS — M5136 Other intervertebral disc degeneration, lumbar region: Secondary | ICD-10-CM | POA: Diagnosis not present

## 2015-02-15 ENCOUNTER — Other Ambulatory Visit (HOSPITAL_COMMUNITY): Payer: Self-pay | Admitting: Family Medicine

## 2015-02-15 DIAGNOSIS — E6609 Other obesity due to excess calories: Secondary | ICD-10-CM | POA: Diagnosis not present

## 2015-02-15 DIAGNOSIS — M5410 Radiculopathy, site unspecified: Secondary | ICD-10-CM | POA: Diagnosis not present

## 2015-02-15 DIAGNOSIS — M545 Low back pain: Secondary | ICD-10-CM | POA: Diagnosis not present

## 2015-02-15 DIAGNOSIS — Z6834 Body mass index (BMI) 34.0-34.9, adult: Secondary | ICD-10-CM | POA: Diagnosis not present

## 2015-02-15 DIAGNOSIS — R413 Other amnesia: Secondary | ICD-10-CM | POA: Diagnosis not present

## 2015-02-20 ENCOUNTER — Encounter (HOSPITAL_COMMUNITY): Payer: Self-pay | Admitting: *Deleted

## 2015-02-20 ENCOUNTER — Emergency Department (HOSPITAL_COMMUNITY)
Admission: EM | Admit: 2015-02-20 | Discharge: 2015-02-21 | Disposition: A | Discharge status: Home / Self Care | Payer: Medicare Other | Attending: Emergency Medicine | Admitting: Emergency Medicine

## 2015-02-20 ENCOUNTER — Emergency Department (HOSPITAL_COMMUNITY): Payer: Medicare Other

## 2015-02-20 DIAGNOSIS — Z880 Allergy status to penicillin: Secondary | ICD-10-CM | POA: Insufficient documentation

## 2015-02-20 DIAGNOSIS — N390 Urinary tract infection, site not specified: Secondary | ICD-10-CM | POA: Diagnosis not present

## 2015-02-20 DIAGNOSIS — E785 Hyperlipidemia, unspecified: Secondary | ICD-10-CM | POA: Diagnosis not present

## 2015-02-20 DIAGNOSIS — W1839XA Other fall on same level, initial encounter: Secondary | ICD-10-CM | POA: Diagnosis not present

## 2015-02-20 DIAGNOSIS — S0101XA Laceration without foreign body of scalp, initial encounter: Secondary | ICD-10-CM | POA: Insufficient documentation

## 2015-02-20 DIAGNOSIS — E119 Type 2 diabetes mellitus without complications: Secondary | ICD-10-CM | POA: Insufficient documentation

## 2015-02-20 DIAGNOSIS — Z7982 Long term (current) use of aspirin: Secondary | ICD-10-CM | POA: Insufficient documentation

## 2015-02-20 DIAGNOSIS — M797 Fibromyalgia: Secondary | ICD-10-CM | POA: Diagnosis not present

## 2015-02-20 DIAGNOSIS — I10 Essential (primary) hypertension: Secondary | ICD-10-CM | POA: Insufficient documentation

## 2015-02-20 DIAGNOSIS — Y9289 Other specified places as the place of occurrence of the external cause: Secondary | ICD-10-CM | POA: Insufficient documentation

## 2015-02-20 DIAGNOSIS — K219 Gastro-esophageal reflux disease without esophagitis: Secondary | ICD-10-CM | POA: Diagnosis not present

## 2015-02-20 DIAGNOSIS — F419 Anxiety disorder, unspecified: Secondary | ICD-10-CM | POA: Insufficient documentation

## 2015-02-20 DIAGNOSIS — Z23 Encounter for immunization: Secondary | ICD-10-CM | POA: Diagnosis not present

## 2015-02-20 DIAGNOSIS — Z87891 Personal history of nicotine dependence: Secondary | ICD-10-CM | POA: Insufficient documentation

## 2015-02-20 DIAGNOSIS — M1990 Unspecified osteoarthritis, unspecified site: Secondary | ICD-10-CM | POA: Insufficient documentation

## 2015-02-20 DIAGNOSIS — Z79899 Other long term (current) drug therapy: Secondary | ICD-10-CM | POA: Insufficient documentation

## 2015-02-20 DIAGNOSIS — Y9389 Activity, other specified: Secondary | ICD-10-CM | POA: Insufficient documentation

## 2015-02-20 DIAGNOSIS — Z87440 Personal history of urinary (tract) infections: Secondary | ICD-10-CM

## 2015-02-20 DIAGNOSIS — W19XXXA Unspecified fall, initial encounter: Secondary | ICD-10-CM

## 2015-02-20 DIAGNOSIS — S0990XA Unspecified injury of head, initial encounter: Secondary | ICD-10-CM | POA: Diagnosis present

## 2015-02-20 DIAGNOSIS — Y998 Other external cause status: Secondary | ICD-10-CM | POA: Insufficient documentation

## 2015-02-20 MED ORDER — TETANUS-DIPHTH-ACELL PERTUSSIS 5-2.5-18.5 LF-MCG/0.5 IM SUSP
0.5000 mL | Freq: Once | INTRAMUSCULAR | Status: AC
  Administered 2015-02-21: 0.5 mL via INTRAMUSCULAR
  Filled 2015-02-20: qty 0.5

## 2015-02-20 NOTE — ED Notes (Signed)
Patient states she lost her balance tonight. Patient is being treated for UTI right now, and she states "when i have a urine infection i lose my balance" patient has laceration to posterior head, bleeding controled, patient denies LOC A&OX4, denies pain. Family at bedside no needs voiced.

## 2015-02-20 NOTE — ED Provider Notes (Signed)
CSN: [id number]     Arrival date & time 02/20/15  2128 History   This chart was scribed for Merryl Hacker, MD by Randa Evens, ED Scribe. This patient was seen in room APA02/APA02 and the patient's care was started at 11:36 PM.     Chief Complaint  Patient presents with  . Fall    Patient is a 80 y.o. female presenting with fall. The history is provided by the patient. No language interpreter was used.  Fall Associated symptoms include headaches. Pertinent negatives include no chest pain, no abdominal pain and no shortness of breath.   HPI Comments: Laurie Mcfarland is a 80 y.o. female who presents to the Emergency Department complaining of fall onset tonight PTA. Pt presents with laceration to posterior scalp and slight confusion. Son states that she was diagnosed with an UTI this morning. He states that she has a HX of falling and talking "loopy" when she has an UTI. She states that tonight when she took her macrobid for the UTI she fell. Pt states that she is on aspirin. Denies syncope.  Son states that he noticed she was moving a little to fast when she fell. Pt denies HA or any other symptoms.    Past Medical History  Diagnosis Date  . Hypertension   . Shortness of breath     with exertion  . Diabetes mellitus   . GERD (gastroesophageal reflux disease)   . Anxiety   . Arthritis   . Hyperlipidemia   . Fibromyalgia   . Complication of anesthesia     pt. states that she remembers parts of her surgery when she had her TAH   Past Surgical History  Procedure Laterality Date  . Abdominal hysterectomy    . Foreign body removal      splinter removed from right side of face  . Cataract extraction w/phaco  12/10/2011    Procedure: CATARACT EXTRACTION PHACO AND INTRAOCULAR LENS PLACEMENT (IOC);  Surgeon: Elta Guadeloupe T. Gershon Crane, MD;  Location: [facility];  Service: Ophthalmology;  Laterality: Left;  CDE=10.72  . Cataract extraction w/phaco  01/14/2012    Procedure: CATARACT EXTRACTION PHACO AND  INTRAOCULAR LENS PLACEMENT (IOC);  Surgeon: Elta Guadeloupe T. Gershon Crane, MD;  Location: [facility];  Service: Ophthalmology;  Laterality: Right;  CDE:11.63  . Esophagogastroduodenoscopy (egd) with esophageal dilation N/A 06/17/2013    BOF:BPZWCH esophagus-status post [facility] dilation. Hiatal hernia. Gastric erosions s/p bx   Family History  Problem Relation Age of Onset  . Pseudochol deficiency Neg Hx   . Malignant hyperthermia Neg Hx   . Hypotension Neg Hx   . Anesthesia problems Neg Hx   . Colon cancer Neg Hx   . Cancer Father   . Alzheimer's disease Mother   . Cancer Sister   . Cancer Brother    History  Substance Use Topics  . Smoking status: Former Smoker    Quit date: 08/30/1994  . Smokeless tobacco: Not on file  . Alcohol Use: No   OB History    No data available     Review of Systems  Constitutional: Negative for fever.  Respiratory: Negative for chest tightness and shortness of breath.   Cardiovascular: Negative for chest pain.  Gastrointestinal: Negative for abdominal pain.  Genitourinary: Negative for dysuria.  Musculoskeletal: Negative for back pain.  Skin: Positive for wound.  Neurological: Positive for headaches. Negative for dizziness and syncope.  Psychiatric/Behavioral: Positive for confusion.  All other systems reviewed and are negative.  Allergies  Penicillins  Home Medications   Prior to Admission medications   Medication Sig Start Date End Date Taking? Authorizing Provider  ALPRAZolam Duanne Moron) 1 MG tablet Take 1 mg by mouth 4 (four) times daily as needed. Nerves    Historical Provider, MD  amLODipine (NORVASC) 10 MG tablet Take 10 mg by mouth daily.  05/19/13   Historical Provider, MD  aspirin EC 81 MG tablet Take 81 mg by mouth daily as needed (chest pain).     Historical Provider, MD  Cholecalciferol (VITAMIN D-3) 1000 UNITS CAPS Take 1 capsule by mouth 2 (two) times daily.     Historical Provider, MD  furosemide (LASIX) 20 MG tablet Take 20 mg by mouth  daily as needed for fluid or edema.  06/01/13   Historical Provider, MD  gabapentin (NEURONTIN) 100 MG capsule Take 100 mg by mouth 3 (three) times daily.     Historical Provider, MD  Ginkgo Biloba 60 MG CAPS Take 1 capsule by mouth daily.     Historical Provider, MD  HYDROcodone-acetaminophen (NORCO) 10-325 MG per tablet Take 1 tablet by mouth every 6 (six) hours as needed for moderate pain. 07/15/14   Samuella Cota, MD  metFORMIN (GLUCOPHAGE) 500 MG tablet Take 500 mg by mouth 2 (two) times daily with a meal.     Historical Provider, MD  mirtazapine (REMERON) 15 MG tablet Take 15 mg by mouth at bedtime.    Historical Provider, MD  Misc Natural Products (OSTEO BI-FLEX ADV DOUBLE ST PO) Take 1 tablet by mouth every morning.     Historical Provider, MD  Multiple Vitamin (MULTIVITAMIN WITH MINERALS) TABS tablet Take 1 tablet by mouth daily.    Historical Provider, MD  multivitamin-lutein (OCUVITE-LUTEIN) CAPS Take 1 capsule by mouth every morning.     Historical Provider, MD  omeprazole (PRILOSEC) 40 MG capsule Take 40 mg by mouth daily.    Historical Provider, MD  potassium chloride SA (K-DUR,KLOR-CON) 20 MEQ tablet Take 20 mEq by mouth every morning.     Historical Provider, MD  Probiotic Product (TRUBIOTICS) CAPS Take 1 capsule by mouth every morning.     Historical Provider, MD  simvastatin (ZOCOR) 20 MG tablet Take 20 mg by mouth every evening.    Historical Provider, MD  vitamin C (ASCORBIC ACID) 500 MG tablet Take 500 mg by mouth every morning.     Historical Provider, MD   BP 111/90 mmHg  Pulse 81  Temp(Src) 98.9 F (37.2 C) (Oral)  Resp 18  Ht 5\' 3"  (1.6 m)  Wt 194 lb (87.998 kg)  BMI 34.37 kg/m2  SpO2 96%   Physical Exam  Constitutional: She is oriented to person, place, and time. No distress.  Elderly  HENT:  Head: Normocephalic.  4 cm laceration over the posterior scalp, leading controlled  Eyes: Pupils are equal, round, and reactive to light.  Neck: Normal range of  motion. Neck supple.  No midline C-spine tenderness  Cardiovascular: Normal rate, regular rhythm and normal heart sounds.   Pulmonary/Chest: Effort normal and breath sounds normal. No respiratory distress. She has no wheezes.  Abdominal: Soft. Bowel sounds are normal. There is no tenderness. There is no rebound.  Musculoskeletal: Normal range of motion.  No obvious deformities  Neurological: She is alert and oriented to person, place, and time.  Occasionally inappropriately answers questions  Skin: Skin is warm.  Laceration as noted above  Psychiatric: She has a normal mood and affect.  Nursing note and vitals reviewed.  ED Course  Procedures (including critical care time)  LACERATION REPAIR Performed by: Merryl Hacker Authorized by: Merryl Hacker Consent: Verbal consent obtained. Risks and benefits: risks, benefits and alternatives were discussed Consent given by: patient Patient identity confirmed: provided demographic data Prepped and Draped in normal sterile fashion Wound explored  Laceration Location: scalp  Laceration Length: 4cm  No Foreign Bodies seen or palpated    Irrigation method: syringe Amount of cleaning: standard  Skin closure: staples    Number of sutures: 3   Patient tolerance: Patient tolerated the procedure well with no immediate complications.  Labs Reviewed - No data to display  DIAGNOSTIC STUDIES: Oxygen Saturation is 96% on RA, adequate by my interpretation.    COORDINATION OF CARE: 11:41 PM-Discussed treatment plan with pt at bedside and pt agreed to plan.     Labs Review Labs Reviewed - No data to display  Imaging Review Ct Head Wo Contrast  02/21/2015   CLINICAL DATA:  Fall immediately prior to arrival, posterior scalp laceration and confusion. Concurrent urinary tract infection, similar associated falls and altered mental status associated with urinary tract infections in the past. History of hypertension, diabetes,  hyperlipidemia.  EXAM: CT HEAD WITHOUT CONTRAST  TECHNIQUE: Contiguous axial images were obtained from the base of the skull through the vertex without intravenous contrast  COMPARISON:  CT head July 13, 2014  FINDINGS: The ventricles and sulci are normal for age. No intraparenchymal hemorrhage, mass effect nor midline shift. Patchy supratentorial white matter hypodensities are less than expected for patient's age and though non-specific suggest sequelae of chronic small vessel ischemic disease. No acute large vascular territory infarcts.  No abnormal extra-axial fluid collections. Basal cisterns are patent. Moderate calcific atherosclerosis of the carotid siphons.  No skull fracture. RIGHT parietal skin staples. The included ocular globes and orbital contents are non-suspicious. The mastoid aircells and included paranasal sinuses are well-aerated. Patient is nearly edentulous.  IMPRESSION: RIGHT parietal scalp skin staples.  No skull fracture.  No acute intracranial process.  Involutional changes  Involutional changes. Mild white matter changes compatible chronic small vessel ischemic disease, less than expected for age.   Electronically Signed   By: Elon Alas M.D.   On: 02/21/2015 00:34     EKG Interpretation None      MDM   Final diagnoses:  Fall, initial encounter  Scalp laceration, initial encounter  History of recurrent UTIs    Patient presents with a scalp laceration following a fall. Currently being treated for urinary tract infection. Reports the following is common when patient has urinary tract infection. She is occasionally inappropriately answering questions but is otherwise awake alert and oriented. CT scan is negative. Scalp laceration repaired at the bedside. Tetanus updated. Patient son is at the bedside and will take her home.  After history, exam, and medical workup I feel the patient has been appropriately medically screened and is safe for discharge home. Pertinent  diagnoses were discussed with the patient. Patient was given return precautions.   I personally performed the services described in this documentation, which was scribed in my presence. The recorded information has been reviewed and is accurate.      Merryl Hacker, MD 02/21/15 [phone]

## 2015-02-20 NOTE — ED Notes (Addendum)
Fell, lac to scalp , alert, talking.

## 2015-02-21 DIAGNOSIS — S0101XA Laceration without foreign body of scalp, initial encounter: Secondary | ICD-10-CM | POA: Diagnosis not present

## 2015-02-21 NOTE — ED Notes (Signed)
Patient verbalizes understanding of discharge instructions, home care, and follow up care. Patient out of department at this time with family.

## 2015-02-21 NOTE — Discharge Instructions (Signed)
You were seen today after a fall. Her fall is likely because of your current urinary tract infection. Your CT scan is negative.  You need to have your staples removed in 7-10 days.  Fall Prevention and Home Safety Falls cause injuries and can affect all age groups. It is possible to use preventive measures to significantly decrease the likelihood of falls. There are many simple measures which can make your home safer and prevent falls. OUTDOORS  Repair cracks and edges of walkways and driveways.  Remove high doorway thresholds.  Trim shrubbery on the main path into your home.  Have good outside lighting.  Clear walkways of tools, rocks, debris, and clutter.  Check that handrails are not broken and are securely fastened. Both sides of steps should have handrails.  Have leaves, snow, and ice cleared regularly.  Use sand or salt on walkways during winter months.  In the garage, clean up grease or oil spills. BATHROOM  Install night lights.  Install grab bars by the toilet and in the tub and shower.  Use non-skid mats or decals in the tub or shower.  Place a plastic non-slip stool in the shower to sit on, if needed.  Keep floors dry and clean up all water on the floor immediately.  Remove soap buildup in the tub or shower on a regular basis.  Secure bath mats with non-slip, double-sided rug tape.  Remove throw rugs and tripping hazards from the floors. BEDROOMS  Install night lights.  Make sure a bedside light is easy to reach.  Do not use oversized bedding.  Keep a telephone by your bedside.  Have a firm chair with side arms to use for getting dressed.  Remove throw rugs and tripping hazards from the floor. KITCHEN  Keep handles on pots and pans turned toward the center of the stove. Use back burners when possible.  Clean up spills quickly and allow time for drying.  Avoid walking on wet floors.  Avoid hot utensils and knives.  Position shelves so they are  not too high or low.  Place commonly used objects within easy reach.  If necessary, use a sturdy step stool with a grab bar when reaching.  Keep electrical cables out of the way.  Do not use floor polish or wax that makes floors slippery. If you must use wax, use non-skid floor wax.  Remove throw rugs and tripping hazards from the floor. STAIRWAYS  Never leave objects on stairs.  Place handrails on both sides of stairways and use them. Fix any loose handrails. Make sure handrails on both sides of the stairways are as long as the stairs.  Check carpeting to make sure it is firmly attached along stairs. Make repairs to worn or loose carpet promptly.  Avoid placing throw rugs at the top or bottom of stairways, or properly secure the rug with carpet tape to prevent slippage. Get rid of throw rugs, if possible.  Have an electrician put in a light switch at the top and bottom of the stairs. OTHER FALL PREVENTION TIPS  Wear low-heel or rubber-soled shoes that are supportive and fit well. Wear closed toe shoes.  When using a stepladder, make sure it is fully opened and both spreaders are firmly locked. Do not climb a closed stepladder.  Add color or contrast paint or tape to grab bars and handrails in your home. Place contrasting color strips on first and last steps.  Learn and use mobility aids as needed. Install an Dealer emergency  response system.  Turn on lights to avoid dark areas. Replace light bulbs that burn out immediately. Get light switches that glow.  Arrange furniture to create clear pathways. Keep furniture in the same place.  Firmly attach carpet with non-skid or double-sided tape.  Eliminate uneven floor surfaces.  Select a carpet pattern that does not visually hide the edge of steps.  Be aware of all pets. OTHER HOME SAFETY TIPSLaceration Care, Adult A laceration is a cut or lesion that goes through all layers of the skin and into the tissue just beneath the  skin. TREATMENT  Some lacerations may not require closure. Some lacerations may not be able to be closed due to an increased risk of infection. It is important to see your caregiver as soon as possible after an injury to minimize the risk of infection and maximize the opportunity for successful closure. If closure is appropriate, pain medicines may be given, if needed. The wound will be cleaned to help prevent infection. Your caregiver will use stitches (sutures), staples, wound glue (adhesive), or skin adhesive strips to repair the laceration. These tools bring the skin edges together to allow for faster healing and a better cosmetic outcome. However, all wounds will heal with a scar. Once the wound has healed, scarring can be minimized by covering the wound with sunscreen during the day for 1 full year. HOME CARE INSTRUCTIONS  For sutures or staples:  Keep the wound clean and dry.  If you were given a bandage (dressing), you should change it at least once a day. Also, change the dressing if it becomes wet or dirty, or as directed by your caregiver.  Wash the wound with soap and water 2 times a day. Rinse the wound off with water to remove all soap. Pat the wound dry with a clean towel.  After cleaning, apply a thin layer of the antibiotic ointment as recommended by your caregiver. This will help prevent infection and keep the dressing from sticking.  You may shower as usual after the first 24 hours. Do not soak the wound in water until the sutures are removed.  Only take over-the-counter or prescription medicines for pain, discomfort, or fever as directed by your caregiver.  Get your sutures or staples removed as directed by your caregiver. For skin adhesive strips:  Keep the wound clean and dry.  Do not get the skin adhesive strips wet. You may bathe carefully, using caution to keep the wound dry.  If the wound gets wet, pat it dry with a clean towel.  Skin adhesive strips will fall  off on their own. You may trim the strips as the wound heals. Do not remove skin adhesive strips that are still stuck to the wound. They will fall off in time. For wound adhesive:  You may briefly wet your wound in the shower or bath. Do not soak or scrub the wound. Do not swim. Avoid periods of heavy perspiration until the skin adhesive has fallen off on its own. After showering or bathing, gently pat the wound dry with a clean towel.  Do not apply liquid medicine, cream medicine, or ointment medicine to your wound while the skin adhesive is in place. This may loosen the film before your wound is healed.  If a dressing is placed over the wound, be careful not to apply tape directly over the skin adhesive. This may cause the adhesive to be pulled off before the wound is healed.  Avoid prolonged exposure to sunlight or  tanning lamps while the skin adhesive is in place. Exposure to ultraviolet light in the first year will darken the scar.  The skin adhesive will usually remain in place for 5 to 10 days, then naturally fall off the skin. Do not pick at the adhesive film. You may need a tetanus shot if:  You cannot remember when you had your last tetanus shot.  You have never had a tetanus shot. If you get a tetanus shot, your arm may swell, get red, and feel warm to the touch. This is common and not a problem. If you need a tetanus shot and you choose not to have one, there is a rare chance of getting tetanus. Sickness from tetanus can be serious. SEEK MEDICAL CARE IF:   You have redness, swelling, or increasing pain in the wound.  You see a red line that goes away from the wound.  You have yellowish-white fluid (pus) coming from the wound.  You have a fever.  You notice a bad smell coming from the wound or dressing.  Your wound breaks open before or after sutures have been removed.  You notice something coming out of the wound such as wood or glass.  Your wound is on your hand or foot  and you cannot move a finger or toe. SEEK IMMEDIATE MEDICAL CARE IF:   Your pain is not controlled with prescribed medicine.  You have severe swelling around the wound causing pain and numbness or a change in color in your arm, hand, leg, or foot.  Your wound splits open and starts bleeding.  You have worsening numbness, weakness, or loss of function of any joint around or beyond the wound.  You develop painful lumps near the wound or on the skin anywhere on your body. MAKE SURE YOU:   Understand these instructions.  Will watch your condition.  Will get help right away if you are not doing well or get worse. Document Released: 08/19/2005 Document Revised: 11/11/2011 Document Reviewed: 02/12/2011 [facility] Patient Information 2015 [place], [place]. This information is not intended to replace advice given to you by your health care provider. Make sure you discuss any questions you have with your health care provider.   Set the water temperature for 120 F (48.8 C).  Keep emergency numbers on or near the telephone.  Keep smoke detectors on every level of the home and near sleeping areas. Document Released: 08/09/2002 Document Revised: 02/18/2012 Document Reviewed: 11/08/2011 [facility] Patient Information 2015 [place], [place]. This information is not intended to replace advice given to you by your health care provider. Make sure you discuss any questions you have with your health care provider.

## 2015-02-24 ENCOUNTER — Other Ambulatory Visit (HOSPITAL_COMMUNITY): Payer: Medicare Other

## 2015-03-01 ENCOUNTER — Ambulatory Visit (INDEPENDENT_AMBULATORY_CARE_PROVIDER_SITE_OTHER): Payer: Medicare Other | Admitting: Neurology

## 2015-03-01 ENCOUNTER — Encounter: Payer: Self-pay | Admitting: Neurology

## 2015-03-01 VITALS — BP 124/68 | HR 93 | Resp 16 | Ht 62.0 in | Wt 196.0 lb

## 2015-03-01 DIAGNOSIS — R296 Repeated falls: Secondary | ICD-10-CM

## 2015-03-01 DIAGNOSIS — R2681 Unsteadiness on feet: Secondary | ICD-10-CM

## 2015-03-01 DIAGNOSIS — F0390 Unspecified dementia without behavioral disturbance: Secondary | ICD-10-CM | POA: Diagnosis not present

## 2015-03-01 DIAGNOSIS — F03A0 Unspecified dementia, mild, without behavioral disturbance, psychotic disturbance, mood disturbance, and anxiety: Secondary | ICD-10-CM | POA: Insufficient documentation

## 2015-03-01 NOTE — Progress Notes (Signed)
NEUROLOGY FOLLOW UP OFFICE NOTE  Laurie Mcfarland [id number]  HISTORY OF PRESENT ILLNESS: I had the pleasure of seeing Laurie Mcfarland in follow-up in the neurology clinic on 03/01/2015.  She is accompanied by her daughter-in-law who helps supplement the history today. The patient was last seen 6 months ago for memory loss. She started having recurrent falls and slurred speech in October 2015 and was ultimately diagnosed with a UTI after a bigger fall. With treatment of UTI, confusion improved. Her MMSE on her last visit was 25/30 with family reporting she was back to baseline. She had another recent fall last 02/20/15 at home, she recalls leaning down to get her pill then fell forward. She needed staples on the right posterior head region. She denies any loss of consciousness. I personally reviewed head CT done at the ER which did not show any acute abnormalities. She had been diagnosed with a UTI and family again noted that she has more falls and gets confused when she has a UTI. Her daughter-in-law reports that she is back to baseline, but she does note that after she took her pain medication this morning, she was getting "loosey-goosey" then started clearing up just prior to today's visit. She calls frequently to ask the same questions. Her grandson lives with her and prepares food. Another family member lays out her pills in a pillbox, they deny forgetting medications. She does her bills with her family. No difficulties with ADLs. She denies any headaches, dizziness, diplopia, focal numbness/tingling/weakness. She has chronic neck and back pain.   HPI: This is a 80 yo RH woman with a history of diabetes, hypertension, hyperlipidemia, fibromyalgia, anxiety, chronic pain, in her usual state of health until October 2015 when she started having recurrent falls and slurred speech. On 07/13/14, she recalls falling when she got up in the morning, hit her head, then went back to bed. When her family saw her in the  morning, they saw she was bleeding from her head with 2 black eyes. She was confused after the fall and could not remember names. She was brought to [facility] ER where she was found to have a UTI. I personally reviewed head CT without contrast done in the ER which did not show any acute intracranial abnormalities, left frontal scalp hematoma. Confusion was felt to be due to UTI, in addition to medication (Xanax and Lortab). Since then, she has slowly returned to baseline. Her daughter reports that they did notice memory changes around the time of her confusion and falls, which has improved as well. Around a week prior to the fall, she went to have her hair done and went with dirty clothes and without her dentures. She missed a bill payment around that time. They feel she is now back to her baseline with no difficulties with ADLs. She takes gabapentin for back pain and feels that this helps her well. Her mother had Alzheimer's dementia.   PAST MEDICAL HISTORY: Past Medical History  Diagnosis Date  . Hypertension   . Shortness of breath     with exertion  . Diabetes mellitus   . GERD (gastroesophageal reflux disease)   . Anxiety   . Arthritis   . Hyperlipidemia   . Fibromyalgia   . Complication of anesthesia     pt. states that she remembers parts of her surgery when she had her TAH    MEDICATIONS: Current Outpatient Prescriptions on File Prior to Visit  Medication Sig Dispense Refill  . ALPRAZolam Duanne Moron)  1 MG tablet Take 1 mg by mouth 4 (four) times daily as needed. Nerves    . amLODipine (NORVASC) 10 MG tablet Take 10 mg by mouth daily.     Marland Kitchen aspirin EC 81 MG tablet Take 81 mg by mouth daily as needed (chest pain).     . Cholecalciferol (VITAMIN D-3) 1000 UNITS CAPS Take 1 capsule by mouth 2 (two) times daily.     . furosemide (LASIX) 20 MG tablet Take 20 mg by mouth daily as needed for fluid or edema.     . gabapentin (NEURONTIN) 100 MG capsule Take 100 mg by mouth 3 (three) times daily.     .  Ginkgo Biloba 60 MG CAPS Take 1 capsule by mouth daily.     Marland Kitchen HYDROcodone-acetaminophen (NORCO) 10-325 MG per tablet Take 1 tablet by mouth every 6 (six) hours as needed for moderate pain.    . metFORMIN (GLUCOPHAGE) 500 MG tablet Take 500 mg by mouth 2 (two) times daily with a meal.     . mirtazapine (REMERON) 15 MG tablet Take 15 mg by mouth at bedtime.    . Misc Natural Products (OSTEO BI-FLEX ADV DOUBLE ST PO) Take 1 tablet by mouth every morning.     . Multiple Vitamin (MULTIVITAMIN WITH MINERALS) TABS tablet Take 1 tablet by mouth daily.    . multivitamin-lutein (OCUVITE-LUTEIN) CAPS Take 1 capsule by mouth every morning.     Marland Kitchen omeprazole (PRILOSEC) 40 MG capsule Take 40 mg by mouth daily.    . potassium chloride SA (K-DUR,KLOR-CON) 20 MEQ tablet Take 20 mEq by mouth every morning.     . Probiotic Product (TRUBIOTICS) CAPS Take 1 capsule by mouth every morning.     . vitamin C (ASCORBIC ACID) 500 MG tablet Take 500 mg by mouth every morning.      No current facility-administered medications on file prior to visit.    ALLERGIES: Allergies  Allergen Reactions  . Penicillins Rash    FAMILY HISTORY: Family History  Problem Relation Age of Onset  . Pseudochol deficiency Neg Hx   . Malignant hyperthermia Neg Hx   . Hypotension Neg Hx   . Anesthesia problems Neg Hx   . Colon cancer Neg Hx   . Cancer Father   . Alzheimer's disease Mother   . Cancer Sister   . Cancer Brother     SOCIAL HISTORY: History   Social History  . Marital Status: Widowed    Spouse Name: N/A  . Number of Children: 4  . Years of Education: N/A   Occupational History  . Not on file.   Social History Main Topics  . Smoking status: Former Smoker    Quit date: 08/30/1994  . Smokeless tobacco: Not on file  . Alcohol Use: No  . Drug Use: No  . Sexual Activity: Not Currently   Other Topics Concern  . Not on file   Social History Narrative   Adopted granddaughter and raised great grandson.     REVIEW OF SYSTEMS: Constitutional: No fevers, chills, or sweats, no generalized fatigue, change in appetite Eyes: No visual changes, double vision, eye pain Ear, nose and throat: No hearing loss, ear pain, nasal congestion, sore throat Cardiovascular: No chest pain, palpitations Respiratory:  No shortness of breath at rest or with exertion, wheezes GastrointestinaI: No nausea, vomiting, diarrhea, abdominal pain, fecal incontinence Genitourinary:  No dysuria, urinary retention or frequency Musculoskeletal:  + neck pain, back pain Integumentary: No rash, pruritus, skin lesions Neurological:  as above Psychiatric: No depression, insomnia, anxiety Endocrine: No palpitations, fatigue, diaphoresis, mood swings, change in appetite, change in weight, increased thirst Hematologic/Lymphatic:  No anemia, purpura, petechiae. Allergic/Immunologic: no itchy/runny eyes, nasal congestion, recent allergic reactions, rashes  PHYSICAL EXAM: Filed Vitals:   03/01/15 1259  BP: 124/68  Pulse: 93  Resp: 16   General: No acute distress Head:  Normocephalic/atraumatic Neck: supple, no paraspinal tenderness, full range of motion Heart:  Regular rate and rhythm Lungs:  Clear to auscultation bilaterally Back: No paraspinal tenderness Skin/Extremities: No rash, no edema Neurological Exam: alert and oriented to person, place, and day of week/month. Did not know date or year. No aphasia or dysarthria. Fund of knowledge is appropriate.  Remote memory intact.  Attention and concentration are normal.    Able to name objects and repeat phrases.  MMSE - Mini Mental State Exam 03/01/2015 08/30/2014  Orientation to time 3 4  Orientation to Place 4 5  Registration 3 3  Attention/ Calculation 3 4  Recall 0 1  Language- name 2 objects 2 2  Language- repeat 1 1  Language- follow 3 step command 3 2  Language- read & follow direction 1 1  Write a sentence 1 1  Copy design 1 1  Total score 22 25   Cranial  nerves: Pupils equal, round, reactive to light.  Fundoscopic exam unremarkable, no papilledema. Extraocular movements intact with no nystagmus. Visual fields full. Facial sensation intact. No facial asymmetry. Tongue, uvula, palate midline.  Motor: Bulk and tone normal, muscle strength 5/5 throughout with no pronator drift.  Sensation decreased vibration on both LE.  No extinction to double simultaneous stimulation.  Deep tendon reflexes 2+ on both UE, +1 on both LE, toes downgoing.  Finger to nose testing intact.  Gait slow and cautious, no ataxia, difficulty with tandem walk.  Romberg negative.  IMPRESSION: This is a 80 yo RH woman with hypertension, hyperlipidemia, diabetes, fibromyalgia, anxiety, with memory loss. She was initially seen after confusion and fall 6 months ago in the setting of a UTI, again presenting with a fall last week with some mild confusion with UTI. Her MMSE today is 22/30, indicating mild dementia (25/30 in December 2015). We discussed the option for starting cholinesterase inhibitors, including expectations from the medication and side effects. They would like to discuss with family first. We discussed frequent falls, she would benefit from physical therapy for gait and balance therapy. She will discuss the frequent UTIs with her PCP, as falls mainly occur during these times. We discussed home safety, fall and gait safety, as well as the importance of physical exercise and brain stimulation exercises, control of vascular risk factors, for brain health. She will follow-up in 1 year.  Thank you for allowing me to participate in her care.  Please do not hesitate to call for any questions or concerns.  The duration of this appointment visit was 15 minutes of face-to-face time with the patient.  Greater than 50% of this time was spent in counseling, explanation of diagnosis, planning of further management, and coordination of care.   Ellouise Newer, M.D.   CC: Dr.  Gerarda Fraction

## 2015-03-01 NOTE — Patient Instructions (Signed)
1. Refer to Physical Therapy for gait and balance therapy 2. Discuss the option of starting Aricept to help slow down the process of memory loss. It is not a cure and does not reverse the process, but may help slow things down. Side effect may include diarrhea. 3. Follow-up in 1 year

## 2015-03-03 DIAGNOSIS — Z4802 Encounter for removal of sutures: Secondary | ICD-10-CM | POA: Diagnosis not present

## 2015-03-03 DIAGNOSIS — Z6833 Body mass index (BMI) 33.0-33.9, adult: Secondary | ICD-10-CM | POA: Diagnosis not present

## 2015-03-03 DIAGNOSIS — E6609 Other obesity due to excess calories: Secondary | ICD-10-CM | POA: Diagnosis not present

## 2015-03-03 DIAGNOSIS — N390 Urinary tract infection, site not specified: Secondary | ICD-10-CM | POA: Diagnosis not present

## 2015-03-03 DIAGNOSIS — Z1389 Encounter for screening for other disorder: Secondary | ICD-10-CM | POA: Diagnosis not present

## 2015-03-09 ENCOUNTER — Ambulatory Visit (HOSPITAL_COMMUNITY)
Admission: RE | Admit: 2015-03-09 | Discharge: 2015-03-09 | Disposition: A | Discharge status: Home / Self Care | Payer: Medicare Other | Source: Ambulatory Visit | Attending: Family Medicine | Admitting: Family Medicine

## 2015-03-09 DIAGNOSIS — M2578 Osteophyte, vertebrae: Secondary | ICD-10-CM | POA: Insufficient documentation

## 2015-03-09 DIAGNOSIS — M545 Low back pain: Secondary | ICD-10-CM | POA: Insufficient documentation

## 2015-03-09 DIAGNOSIS — M5126 Other intervertebral disc displacement, lumbar region: Secondary | ICD-10-CM | POA: Insufficient documentation

## 2015-03-09 DIAGNOSIS — M79605 Pain in left leg: Secondary | ICD-10-CM | POA: Insufficient documentation

## 2015-03-09 DIAGNOSIS — M79604 Pain in right leg: Secondary | ICD-10-CM | POA: Insufficient documentation

## 2015-03-09 DIAGNOSIS — M5410 Radiculopathy, site unspecified: Secondary | ICD-10-CM

## 2015-04-03 ENCOUNTER — Ambulatory Visit (HOSPITAL_COMMUNITY): Payer: Medicare Other | Admitting: Physical Therapy

## 2015-04-19 DIAGNOSIS — G894 Chronic pain syndrome: Secondary | ICD-10-CM | POA: Diagnosis not present

## 2015-04-19 DIAGNOSIS — Z1389 Encounter for screening for other disorder: Secondary | ICD-10-CM | POA: Diagnosis not present

## 2015-04-19 DIAGNOSIS — F419 Anxiety disorder, unspecified: Secondary | ICD-10-CM | POA: Diagnosis not present

## 2015-04-19 DIAGNOSIS — E669 Obesity, unspecified: Secondary | ICD-10-CM | POA: Diagnosis not present

## 2015-04-19 DIAGNOSIS — Z6834 Body mass index (BMI) 34.0-34.9, adult: Secondary | ICD-10-CM | POA: Diagnosis not present

## 2015-04-19 DIAGNOSIS — E119 Type 2 diabetes mellitus without complications: Secondary | ICD-10-CM | POA: Diagnosis not present

## 2015-06-09 DIAGNOSIS — N390 Urinary tract infection, site not specified: Secondary | ICD-10-CM | POA: Diagnosis not present

## 2015-07-03 DIAGNOSIS — D1779 Benign lipomatous neoplasm of other sites: Secondary | ICD-10-CM | POA: Diagnosis not present

## 2015-07-03 DIAGNOSIS — M47816 Spondylosis without myelopathy or radiculopathy, lumbar region: Secondary | ICD-10-CM | POA: Diagnosis not present

## 2015-07-03 DIAGNOSIS — M4806 Spinal stenosis, lumbar region: Secondary | ICD-10-CM | POA: Diagnosis not present

## 2015-07-14 DIAGNOSIS — E1140 Type 2 diabetes mellitus with diabetic neuropathy, unspecified: Secondary | ICD-10-CM | POA: Diagnosis not present

## 2015-07-14 DIAGNOSIS — F419 Anxiety disorder, unspecified: Secondary | ICD-10-CM | POA: Diagnosis not present

## 2015-07-14 DIAGNOSIS — E6609 Other obesity due to excess calories: Secondary | ICD-10-CM | POA: Diagnosis not present

## 2015-07-14 DIAGNOSIS — Z6833 Body mass index (BMI) 33.0-33.9, adult: Secondary | ICD-10-CM | POA: Diagnosis not present

## 2015-07-14 DIAGNOSIS — I10 Essential (primary) hypertension: Secondary | ICD-10-CM | POA: Diagnosis not present

## 2015-07-14 DIAGNOSIS — Z1389 Encounter for screening for other disorder: Secondary | ICD-10-CM | POA: Diagnosis not present

## 2015-07-31 DIAGNOSIS — Z1389 Encounter for screening for other disorder: Secondary | ICD-10-CM | POA: Diagnosis not present

## 2015-07-31 DIAGNOSIS — E1140 Type 2 diabetes mellitus with diabetic neuropathy, unspecified: Secondary | ICD-10-CM | POA: Diagnosis not present

## 2015-07-31 DIAGNOSIS — I10 Essential (primary) hypertension: Secondary | ICD-10-CM | POA: Diagnosis not present

## 2015-07-31 DIAGNOSIS — E6609 Other obesity due to excess calories: Secondary | ICD-10-CM | POA: Diagnosis not present

## 2015-08-18 DIAGNOSIS — Z1211 Encounter for screening for malignant neoplasm of colon: Secondary | ICD-10-CM | POA: Diagnosis not present

## 2015-09-28 DIAGNOSIS — M545 Low back pain: Secondary | ICD-10-CM | POA: Diagnosis not present

## 2015-09-28 DIAGNOSIS — M7551 Bursitis of right shoulder: Secondary | ICD-10-CM | POA: Diagnosis not present

## 2015-09-28 DIAGNOSIS — M1711 Unilateral primary osteoarthritis, right knee: Secondary | ICD-10-CM | POA: Diagnosis not present

## 2015-10-11 DIAGNOSIS — G894 Chronic pain syndrome: Secondary | ICD-10-CM | POA: Diagnosis not present

## 2015-10-11 DIAGNOSIS — Z1389 Encounter for screening for other disorder: Secondary | ICD-10-CM | POA: Diagnosis not present

## 2015-10-11 DIAGNOSIS — E1142 Type 2 diabetes mellitus with diabetic polyneuropathy: Secondary | ICD-10-CM | POA: Diagnosis not present

## 2015-10-17 ENCOUNTER — Other Ambulatory Visit (HOSPITAL_COMMUNITY): Payer: Self-pay | Admitting: Internal Medicine

## 2015-10-17 DIAGNOSIS — Z1231 Encounter for screening mammogram for malignant neoplasm of breast: Secondary | ICD-10-CM

## 2015-10-30 ENCOUNTER — Ambulatory Visit (HOSPITAL_COMMUNITY)
Admission: RE | Admit: 2015-10-30 | Discharge: 2015-10-30 | Disposition: A | Discharge status: Home / Self Care | Payer: Medicare Other | Source: Ambulatory Visit | Attending: Internal Medicine | Admitting: Internal Medicine

## 2015-10-30 DIAGNOSIS — Z1231 Encounter for screening mammogram for malignant neoplasm of breast: Secondary | ICD-10-CM | POA: Diagnosis not present

## 2015-12-01 DIAGNOSIS — N390 Urinary tract infection, site not specified: Secondary | ICD-10-CM | POA: Diagnosis not present

## 2015-12-01 DIAGNOSIS — M549 Dorsalgia, unspecified: Secondary | ICD-10-CM | POA: Diagnosis not present

## 2015-12-01 DIAGNOSIS — E119 Type 2 diabetes mellitus without complications: Secondary | ICD-10-CM | POA: Diagnosis not present

## 2015-12-01 DIAGNOSIS — G894 Chronic pain syndrome: Secondary | ICD-10-CM | POA: Diagnosis not present

## 2015-12-01 DIAGNOSIS — Z1389 Encounter for screening for other disorder: Secondary | ICD-10-CM | POA: Diagnosis not present

## 2016-01-03 DIAGNOSIS — D179 Benign lipomatous neoplasm, unspecified: Secondary | ICD-10-CM | POA: Diagnosis not present

## 2016-01-03 DIAGNOSIS — I10 Essential (primary) hypertension: Secondary | ICD-10-CM | POA: Diagnosis not present

## 2016-01-03 DIAGNOSIS — G894 Chronic pain syndrome: Secondary | ICD-10-CM | POA: Diagnosis not present

## 2016-01-03 DIAGNOSIS — Z1389 Encounter for screening for other disorder: Secondary | ICD-10-CM | POA: Diagnosis not present

## 2016-01-03 DIAGNOSIS — E119 Type 2 diabetes mellitus without complications: Secondary | ICD-10-CM | POA: Diagnosis not present

## 2016-01-29 ENCOUNTER — Encounter (HOSPITAL_COMMUNITY): Payer: Self-pay | Admitting: Emergency Medicine

## 2016-01-29 ENCOUNTER — Emergency Department (HOSPITAL_COMMUNITY): Payer: Medicare Other

## 2016-01-29 ENCOUNTER — Emergency Department (HOSPITAL_COMMUNITY)
Admission: EM | Admit: 2016-01-29 | Discharge: 2016-01-29 | Disposition: A | Discharge status: Home / Self Care | Payer: Medicare Other | Attending: Emergency Medicine | Admitting: Emergency Medicine

## 2016-01-29 DIAGNOSIS — Z7982 Long term (current) use of aspirin: Secondary | ICD-10-CM | POA: Diagnosis not present

## 2016-01-29 DIAGNOSIS — I10 Essential (primary) hypertension: Secondary | ICD-10-CM | POA: Diagnosis not present

## 2016-01-29 DIAGNOSIS — E119 Type 2 diabetes mellitus without complications: Secondary | ICD-10-CM | POA: Insufficient documentation

## 2016-01-29 DIAGNOSIS — Y999 Unspecified external cause status: Secondary | ICD-10-CM | POA: Diagnosis not present

## 2016-01-29 DIAGNOSIS — Y93H2 Activity, gardening and landscaping: Secondary | ICD-10-CM | POA: Insufficient documentation

## 2016-01-29 DIAGNOSIS — M1990 Unspecified osteoarthritis, unspecified site: Secondary | ICD-10-CM | POA: Diagnosis not present

## 2016-01-29 DIAGNOSIS — S63286A Dislocation of proximal interphalangeal joint of right little finger, initial encounter: Secondary | ICD-10-CM | POA: Insufficient documentation

## 2016-01-29 DIAGNOSIS — Y92007 Garden or yard of unspecified non-institutional (private) residence as the place of occurrence of the external cause: Secondary | ICD-10-CM | POA: Insufficient documentation

## 2016-01-29 DIAGNOSIS — E785 Hyperlipidemia, unspecified: Secondary | ICD-10-CM | POA: Diagnosis not present

## 2016-01-29 DIAGNOSIS — S63256A Unspecified dislocation of right little finger, initial encounter: Secondary | ICD-10-CM | POA: Diagnosis not present

## 2016-01-29 DIAGNOSIS — W231XXA Caught, crushed, jammed, or pinched between stationary objects, initial encounter: Secondary | ICD-10-CM | POA: Diagnosis not present

## 2016-01-29 DIAGNOSIS — Z87891 Personal history of nicotine dependence: Secondary | ICD-10-CM | POA: Diagnosis not present

## 2016-01-29 DIAGNOSIS — Z79899 Other long term (current) drug therapy: Secondary | ICD-10-CM | POA: Diagnosis not present

## 2016-01-29 DIAGNOSIS — Z7984 Long term (current) use of oral hypoglycemic drugs: Secondary | ICD-10-CM | POA: Insufficient documentation

## 2016-01-29 DIAGNOSIS — S6991XA Unspecified injury of right wrist, hand and finger(s), initial encounter: Secondary | ICD-10-CM | POA: Diagnosis not present

## 2016-01-29 DIAGNOSIS — S63259A Unspecified dislocation of unspecified finger, initial encounter: Secondary | ICD-10-CM

## 2016-01-29 DIAGNOSIS — M79641 Pain in right hand: Secondary | ICD-10-CM | POA: Diagnosis present

## 2016-01-29 NOTE — ED Notes (Signed)
Patient verbalizes understanding of discharge instructions, pain management and follow up care if needed. Patient out of department at this time.

## 2016-01-29 NOTE — ED Provider Notes (Signed)
CSN: [id number]     Arrival date & time 01/29/16  1426 History   First MD Initiated Contact with Patient 01/29/16 1528     Chief Complaint  Patient presents with  . Finger Injury     (Consider location/radiation/quality/duration/timing/severity/associated sxs/prior Treatment) Patient is a 81 y.o. female presenting with hand pain.  Hand Pain This is a new problem. The current episode started less than 1 hour ago. The problem occurs constantly. The problem has not changed since onset.Associated symptoms comments: None . Nothing aggravates the symptoms. Nothing relieves the symptoms.    Past Medical History  Diagnosis Date  . Hypertension   . Shortness of breath     with exertion  . Diabetes mellitus   . GERD (gastroesophageal reflux disease)   . Anxiety   . Arthritis   . Hyperlipidemia   . Fibromyalgia   . Complication of anesthesia     pt. states that she remembers parts of her surgery when she had her TAH   Past Surgical History  Procedure Laterality Date  . Abdominal hysterectomy    . Foreign body removal      splinter removed from right side of face  . Cataract extraction w/phaco  12/10/2011    Procedure: CATARACT EXTRACTION PHACO AND INTRAOCULAR LENS PLACEMENT (IOC);  Surgeon: Elta Guadeloupe T. Gershon Crane, MD;  Location: [facility];  Service: Ophthalmology;  Laterality: Left;  CDE=10.72  . Cataract extraction w/phaco  01/14/2012    Procedure: CATARACT EXTRACTION PHACO AND INTRAOCULAR LENS PLACEMENT (IOC);  Surgeon: Elta Guadeloupe T. Gershon Crane, MD;  Location: [facility];  Service: Ophthalmology;  Laterality: Right;  CDE:11.63  . Esophagogastroduodenoscopy (egd) with esophageal dilation N/A 06/17/2013    [id number] esophagus-status post [facility] dilation. Hiatal hernia. Gastric erosions s/p bx   Family History  Problem Relation Age of Onset  . Pseudochol deficiency Neg Hx   . Malignant hyperthermia Neg Hx   . Hypotension Neg Hx   . Anesthesia problems Neg Hx   . Colon cancer Neg Hx   . Cancer Father    . Alzheimer's disease Mother   . Cancer Sister   . Cancer Brother    Social History  Substance Use Topics  . Smoking status: Former Smoker    Quit date: 08/30/1994  . Smokeless tobacco: None  . Alcohol Use: No   OB History    No data available     Review of Systems  Musculoskeletal:       Left fifth finger pain  All other systems reviewed and are negative.     Allergies  Penicillins  Home Medications   Prior to Admission medications   Medication Sig Start Date End Date Taking? Authorizing Provider  aspirin EC 81 MG tablet Take 81 mg by mouth daily as needed (chest pain).    Yes Historical Provider, MD  Cholecalciferol (VITAMIN D-3) 1000 UNITS CAPS Take 1 capsule by mouth 2 (two) times daily.    Yes Historical Provider, MD  furosemide (LASIX) 20 MG tablet Take 20 mg by mouth daily as needed for fluid or edema.  06/01/13  Yes Historical Provider, MD  Ginkgo Biloba 60 MG CAPS Take 1 capsule by mouth daily.    Yes Historical Provider, MD  Misc Natural Products (OSTEO BI-FLEX ADV DOUBLE ST PO) Take 1 tablet by mouth every morning.    Yes Historical Provider, MD  Multiple Vitamin (MULTIVITAMIN WITH MINERALS) TABS tablet Take 1 tablet by mouth daily.   Yes Historical Provider, MD  multivitamin-lutein [facility]) CAPS Take 1  capsule by mouth every morning.    Yes Historical Provider, MD  vitamin C (ASCORBIC ACID) 500 MG tablet Take 500 mg by mouth every morning.    Yes Historical Provider, MD  ALPRAZolam Duanne Moron) 1 MG tablet Take 1 mg by mouth 4 (four) times daily as needed. Nerves    Historical Provider, MD  amLODipine (NORVASC) 10 MG tablet Take 10 mg by mouth daily.  05/19/13   Historical Provider, MD  gabapentin (NEURONTIN) 100 MG capsule Take 100 mg by mouth 3 (three) times daily.     Historical Provider, MD  HYDROcodone-acetaminophen (NORCO) 10-325 MG per tablet Take 1 tablet by mouth every 6 (six) hours as needed for moderate pain. 07/15/14   Samuella Cota, MD   losartan (COZAAR) 25 MG tablet 25 mg. Take 1 tablet daily 02/16/15   Historical Provider, MD  meloxicam (MOBIC) 7.5 MG tablet 7.5 mg. Take 1 tablet bid 02/06/15   Historical Provider, MD  metFORMIN (GLUCOPHAGE) 500 MG tablet Take 500 mg by mouth 2 (two) times daily with a meal.     Historical Provider, MD  mirtazapine (REMERON) 15 MG tablet Take 15 mg by mouth at bedtime.    Historical Provider, MD  omeprazole (PRILOSEC) 40 MG capsule Take 40 mg by mouth daily.    Historical Provider, MD  potassium chloride SA (K-DUR,KLOR-CON) 20 MEQ tablet Take 20 mEq by mouth every morning.     Historical Provider, MD  Probiotic Product (TRUBIOTICS) CAPS Take 1 capsule by mouth every morning.     Historical Provider, MD   BP 151/84 mmHg  Pulse 86  Temp(Src) 98.4 F (36.9 C) (Oral)  Resp 18  Ht 5\' 6"  (1.676 m)  Wt 200 lb (90.719 kg)  BMI 32.30 kg/m2  SpO2 100% Physical Exam  Constitutional: She appears well-developed and well-nourished.  HENT:  Head: Normocephalic and atraumatic.  Neck: Normal range of motion.  Cardiovascular: Normal rate and regular rhythm.   Pulmonary/Chest: No stridor. No respiratory distress.  Abdominal: She exhibits no distension.  Musculoskeletal: She exhibits edema (right fifth digit) and tenderness (right fifth digit).  Decreased ROM and increased pain of 5th PIP  Neurological: She is alert.  Nursing note and vitals reviewed.   ED Course  Reduction of dislocation Date/Time: 01/30/2016 6:09 PM Performed by: Merrily Pew Authorized by: Merrily Pew Consent: Verbal consent obtained. Risks and benefits: risks, benefits and alternatives were discussed Consent given by: patient Patient understanding: patient states understanding of the procedure being performed Patient consent: the patient's understanding of the procedure matches consent given Patient identity confirmed: verbally with patient Local anesthesia used: no Patient sedated: no Patient tolerance: Patient  tolerated the procedure well with no immediate complications Comments: Reduced 5th PIP dislocation with pressure/counter pressure.    (including critical care time) Labs Review Labs Reviewed - No data to display  Imaging Review Dg Hand Complete Right  01/29/2016  CLINICAL DATA:  Slip and fall with fifth digit injury, initial encounter EXAM: RIGHT HAND - COMPLETE 3+ VIEW COMPARISON:  None. FINDINGS: Degenerative changes are noted throughout the interphalangeal joints. Some soft tissue swelling of the fifth digit is noted. No definitive dislocation or fracture is seen. IMPRESSION: Degenerative changes without acute bony abnormality. Electronically Signed   By: Inez Catalina M.D.   On: 01/29/2016 14:56   I have personally reviewed and evaluated these images and lab results as part of my medical decision-making.   EKG Interpretation None      MDM   Final diagnoses:  Finger dislocation, initial encounter    Patient jammed her finger or can the guard with obvious deformity on examination. Finger was reduced as per procedure note above with improvement in range of motion and alignment. Patient tolerated the procedure well. I discussed that she needed a follow-up with a hand surgeon, however she was reluctant to do so. I compromised and said that if she had continued swelling or when the swelling improved and she had continued deformity or any difficulty with range of motion or severe pain she would need to follow up with hand surgery no matter what. Patient understood this and also understood that she do follow up with hand surgery if she changed her mind. Patient was splinted and instructed to take the splint off in  2-3 days.  New Prescriptions: Discharge Medication List as of 01/29/2016  3:43 PM       I have personally and contemperaneously reviewed labs and imaging and used in my decision making as above.   A medical screening exam was performed and I feel the patient has had an  appropriate workup for their chief complaint at this time and likelihood of emergent condition existing is low and thus workup can continue on an outpatient basis.. Their vital signs are stable. They have been counseled on decision, discharge, follow up and which symptoms necessitate immediate return to the emergency department.  They verbally stated understanding and agreement with plan and discharged in stable condition.      Merrily Pew, MD 01/30/16 [phone]

## 2016-01-29 NOTE — ED Notes (Signed)
Pt states she was working in the garden with a trowel and her hand slipped and her right pinkie jammed into the dirt.

## 2016-03-29 DIAGNOSIS — I10 Essential (primary) hypertension: Secondary | ICD-10-CM | POA: Diagnosis not present

## 2016-03-29 DIAGNOSIS — E119 Type 2 diabetes mellitus without complications: Secondary | ICD-10-CM | POA: Diagnosis not present

## 2016-03-29 DIAGNOSIS — R201 Hypoesthesia of skin: Secondary | ICD-10-CM | POA: Diagnosis not present

## 2016-03-29 DIAGNOSIS — N3000 Acute cystitis without hematuria: Secondary | ICD-10-CM | POA: Diagnosis not present

## 2016-03-29 DIAGNOSIS — G894 Chronic pain syndrome: Secondary | ICD-10-CM | POA: Diagnosis not present

## 2016-03-29 DIAGNOSIS — M47816 Spondylosis without myelopathy or radiculopathy, lumbar region: Secondary | ICD-10-CM | POA: Diagnosis not present

## 2016-06-11 DIAGNOSIS — E1140 Type 2 diabetes mellitus with diabetic neuropathy, unspecified: Secondary | ICD-10-CM | POA: Diagnosis not present

## 2016-06-11 DIAGNOSIS — I10 Essential (primary) hypertension: Secondary | ICD-10-CM | POA: Diagnosis not present

## 2016-06-11 DIAGNOSIS — R600 Localized edema: Secondary | ICD-10-CM | POA: Diagnosis not present

## 2016-06-11 DIAGNOSIS — R201 Hypoesthesia of skin: Secondary | ICD-10-CM | POA: Diagnosis not present

## 2016-06-11 DIAGNOSIS — Z1389 Encounter for screening for other disorder: Secondary | ICD-10-CM | POA: Diagnosis not present

## 2016-06-28 ENCOUNTER — Emergency Department (HOSPITAL_COMMUNITY): Payer: Medicare Other

## 2016-06-28 ENCOUNTER — Observation Stay (HOSPITAL_COMMUNITY)
Admission: EM | Admit: 2016-06-28 | Discharge: 2016-06-29 | Disposition: A | Discharge status: Home / Self Care | Payer: Medicare Other | Attending: Emergency Medicine | Admitting: Emergency Medicine

## 2016-06-28 ENCOUNTER — Encounter (HOSPITAL_COMMUNITY): Payer: Self-pay

## 2016-06-28 DIAGNOSIS — Z7984 Long term (current) use of oral hypoglycemic drugs: Secondary | ICD-10-CM | POA: Diagnosis not present

## 2016-06-28 DIAGNOSIS — E119 Type 2 diabetes mellitus without complications: Secondary | ICD-10-CM | POA: Diagnosis not present

## 2016-06-28 DIAGNOSIS — Z87891 Personal history of nicotine dependence: Secondary | ICD-10-CM | POA: Insufficient documentation

## 2016-06-28 DIAGNOSIS — R609 Edema, unspecified: Secondary | ICD-10-CM

## 2016-06-28 DIAGNOSIS — J9811 Atelectasis: Secondary | ICD-10-CM | POA: Diagnosis not present

## 2016-06-28 DIAGNOSIS — R531 Weakness: Secondary | ICD-10-CM | POA: Diagnosis not present

## 2016-06-28 DIAGNOSIS — S098XXA Other specified injuries of head, initial encounter: Secondary | ICD-10-CM | POA: Diagnosis not present

## 2016-06-28 DIAGNOSIS — M25561 Pain in right knee: Secondary | ICD-10-CM | POA: Diagnosis not present

## 2016-06-28 DIAGNOSIS — I10 Essential (primary) hypertension: Secondary | ICD-10-CM | POA: Insufficient documentation

## 2016-06-28 DIAGNOSIS — Z79899 Other long term (current) drug therapy: Secondary | ICD-10-CM | POA: Insufficient documentation

## 2016-06-28 DIAGNOSIS — Z7982 Long term (current) use of aspirin: Secondary | ICD-10-CM | POA: Insufficient documentation

## 2016-06-28 DIAGNOSIS — R42 Dizziness and giddiness: Secondary | ICD-10-CM | POA: Diagnosis not present

## 2016-06-28 DIAGNOSIS — N289 Disorder of kidney and ureter, unspecified: Secondary | ICD-10-CM

## 2016-06-28 DIAGNOSIS — Y92009 Unspecified place in unspecified non-institutional (private) residence as the place of occurrence of the external cause: Secondary | ICD-10-CM | POA: Insufficient documentation

## 2016-06-28 DIAGNOSIS — G4489 Other headache syndrome: Secondary | ICD-10-CM | POA: Diagnosis not present

## 2016-06-28 DIAGNOSIS — R55 Syncope and collapse: Secondary | ICD-10-CM | POA: Diagnosis not present

## 2016-06-28 DIAGNOSIS — Y939 Activity, unspecified: Secondary | ICD-10-CM | POA: Insufficient documentation

## 2016-06-28 DIAGNOSIS — D649 Anemia, unspecified: Secondary | ICD-10-CM

## 2016-06-28 DIAGNOSIS — W1809XA Striking against other object with subsequent fall, initial encounter: Secondary | ICD-10-CM | POA: Diagnosis not present

## 2016-06-28 DIAGNOSIS — S0990XA Unspecified injury of head, initial encounter: Secondary | ICD-10-CM | POA: Diagnosis not present

## 2016-06-28 DIAGNOSIS — N179 Acute kidney failure, unspecified: Secondary | ICD-10-CM | POA: Insufficient documentation

## 2016-06-28 DIAGNOSIS — Y999 Unspecified external cause status: Secondary | ICD-10-CM | POA: Insufficient documentation

## 2016-06-28 DIAGNOSIS — R404 Transient alteration of awareness: Secondary | ICD-10-CM | POA: Diagnosis not present

## 2016-06-28 LAB — CBC WITH DIFFERENTIAL/PLATELET
Basophils Absolute: 0 10*3/uL (ref 0.0–0.1)
Basophils Relative: 0 %
Eosinophils Absolute: 0.2 10*3/uL (ref 0.0–0.7)
Eosinophils Relative: 2 %
HEMATOCRIT: 36.7 % (ref 36.0–46.0)
Hemoglobin: 11.9 g/dL — ABNORMAL LOW (ref 12.0–15.0)
LYMPHS PCT: 39 %
Lymphs Abs: 3.7 10*3/uL (ref 0.7–4.0)
MCH: 30.1 pg (ref 26.0–34.0)
MCHC: 32.4 g/dL (ref 30.0–36.0)
MCV: 92.7 fL (ref 78.0–100.0)
MONO ABS: 0.7 10*3/uL (ref 0.1–1.0)
MONOS PCT: 8 %
Neutro Abs: 4.7 10*3/uL (ref 1.7–7.7)
Neutrophils Relative %: 51 %
Platelets: 220 10*3/uL (ref 150–400)
RBC: 3.96 MIL/uL (ref 3.87–5.11)
RDW: 13.2 % (ref 11.5–15.5)
WBC: 9.3 10*3/uL (ref 4.0–10.5)

## 2016-06-28 LAB — CBG MONITORING, ED: GLUCOSE-CAPILLARY: 120 mg/dL — AB (ref 65–99)

## 2016-06-28 LAB — I-STAT TROPONIN, ED: Troponin i, poc: 0 ng/mL (ref 0.00–0.08)

## 2016-06-28 NOTE — ED Provider Notes (Signed)
[facility] DEPT Provider Note   CSN: [id number] Arrival date & time: 06/28/16  2158     History   Chief Complaint Chief Complaint  Patient presents with  . Fall    HPI Laurie Mcfarland is a 81 y.o. female who presents for Syncope. The patient Has a previous history of syncope and collapse about one year ago. She states that she lost her ketones. The car and was looking around the house for the key because she states that he states "I didn't want to hear anybody run off at the mouth."  Patient states that she was standing in her kitchen when she suddenly felt extremely funny and she thought herself "why do I feel so funny?" Patient states the next thing she knew she hit the floor of the kitchen. She is a previous episode in 2015, after watching TV and was found to have a urinary tract infection at that time. She denies racing or skipping in her heart. She does take a Lasix pill occasionally for leg swelling but states she hasn't had any lately. She denies fevers, chills, myalgias, recent illness, urinary symptoms. She denies unilateral leg swelling, signs or symptoms of DVT or pulmonary embolus. She denies chest pain, shortness of breath.  HPI  Past Medical History:  Diagnosis Date  . Anxiety   . Arthritis   . Complication of anesthesia    pt. states that she remembers parts of her surgery when she had her TAH  . Diabetes mellitus   . Fibromyalgia   . GERD (gastroesophageal reflux disease)   . Hyperlipidemia   . Hypertension   . Shortness of breath    with exertion    Patient Active Problem List   Diagnosis Date Noted  . Mild dementia 03/01/2015  . Gait instability 03/01/2015  . Frequent falls 03/01/2015  . Memory loss 09/03/2014  . Essential hypertension 09/03/2014  . Hyperlipidemia 09/03/2014  . Diabetes type 2, controlled ([place]) 09/03/2014  . Confusion 07/13/2014  . UTI (lower urinary tract infection) 07/13/2014  . Acute encephalopathy 07/13/2014  . Fall at home  07/13/2014  . DM type 2 (diabetes mellitus, type 2) ([place]) 07/13/2014  . Esophageal dysphagia 06/09/2013  . GERD (gastroesophageal reflux disease) 06/09/2013  . Early satiety 06/09/2013    Past Surgical History:  Procedure Laterality Date  . ABDOMINAL HYSTERECTOMY    . CATARACT EXTRACTION W/PHACO  12/10/2011   Procedure: CATARACT EXTRACTION PHACO AND INTRAOCULAR LENS PLACEMENT (IOC);  Surgeon: Elta Guadeloupe T. Gershon Crane, MD;  Location: [facility];  Service: Ophthalmology;  Laterality: Left;  CDE=10.72  . CATARACT EXTRACTION W/PHACO  01/14/2012   Procedure: CATARACT EXTRACTION PHACO AND INTRAOCULAR LENS PLACEMENT (IOC);  Surgeon: Elta Guadeloupe T. Gershon Crane, MD;  Location: [facility];  Service: Ophthalmology;  Laterality: Right;  CDE:11.63  . ESOPHAGOGASTRODUODENOSCOPY (EGD) WITH ESOPHAGEAL DILATION N/A 06/17/2013   [id number] esophagus-status post [facility] dilation. Hiatal hernia. Gastric erosions s/p bx  . foreign body removal     splinter removed from right side of face    OB History    No data available       Home Medications    Prior to Admission medications   Medication Sig Start Date End Date Taking? Authorizing Provider  ALPRAZolam Duanne Moron) 1 MG tablet Take 1 mg by mouth 4 (four) times daily as needed for anxiety. Nerves    Yes Historical Provider, MD  amLODipine (NORVASC) 10 MG tablet Take 10 mg by mouth daily.  05/19/13  Yes Historical Provider, MD  aspirin EC  81 MG tablet Take 81 mg by mouth every morning.    Yes Historical Provider, MD  Cholecalciferol (VITAMIN D-3) 1000 UNITS CAPS Take 1 capsule by mouth 2 (two) times daily.    Yes Historical Provider, MD  CRANBERRY FRUIT PO Take 1 capsule by mouth daily.   Yes Historical Provider, MD  furosemide (LASIX) 20 MG tablet Take 20 mg by mouth daily as needed for fluid or edema.  06/01/13  Yes Historical Provider, MD  gabapentin (NEURONTIN) 300 MG capsule Take 300 mg by mouth 4 (four) times daily. 06/07/16  Yes Historical Provider, MD  Ginkgo Biloba 60 MG CAPS  Take 1 capsule by mouth daily.    Yes Historical Provider, MD  meloxicam (MOBIC) 7.5 MG tablet Take 7.5 mg by mouth 2 (two) times daily. Take 1 tablet bid 02/06/15  Yes Historical Provider, MD  metFORMIN (GLUCOPHAGE) 500 MG tablet Take 500 mg by mouth 2 (two) times daily with a meal.    Yes Historical Provider, MD  milk thistle 175 MG tablet Take 175 mg by mouth daily.   Yes Historical Provider, MD  mirtazapine (REMERON) 15 MG tablet Take 15 mg by mouth at bedtime.   Yes Historical Provider, MD  Misc Natural Products (OSTEO BI-FLEX ADV DOUBLE ST PO) Take 1 tablet by mouth every morning.    Yes Historical Provider, MD  Multiple Vitamin (MULTIVITAMIN WITH MINERALS) TABS tablet Take 1 tablet by mouth daily.   Yes Historical Provider, MD  multivitamin-lutein (OCUVITE-LUTEIN) CAPS Take 1 capsule by mouth every morning.    Yes Historical Provider, MD  omeprazole (PRILOSEC) 40 MG capsule Take 40 mg by mouth daily.   Yes Historical Provider, MD  oxyCODONE (OXY IR/ROXICODONE) 5 MG immediate release tablet Take 1 tablet by mouth every 4 (four) hours as needed. For pain 06/15/16  Yes Historical Provider, MD  potassium chloride SA (K-DUR,KLOR-CON) 20 MEQ tablet Take 20 mEq by mouth every morning.    Yes Historical Provider, MD  simvastatin (ZOCOR) 40 MG tablet Take 40 mg by mouth daily. 06/06/16  Yes Historical Provider, MD  vitamin C (ASCORBIC ACID) 500 MG tablet Take 500 mg by mouth every morning.    Yes Historical Provider, MD  Probiotic Product (TRUBIOTICS) CAPS Take 1 capsule by mouth every morning.     Historical Provider, MD    Family History Family History  Problem Relation Age of Onset  . Alzheimer's disease Mother   . Cancer Father   . Cancer Sister   . Cancer Brother   . Pseudochol deficiency Neg Hx   . Malignant hyperthermia Neg Hx   . Hypotension Neg Hx   . Anesthesia problems Neg Hx   . Colon cancer Neg Hx     Social History Social History  Substance Use Topics  . Smoking status:  Former Smoker    Quit date: 08/30/1994  . Smokeless tobacco: Never Used  . Alcohol use No     Allergies   Penicillins   Review of Systems Review of Systems  Ten systems reviewed and are negative for acute change, except as noted in the HPI.   Physical Exam Updated Vital Signs BP 108/55 (BP Location: Left Arm)   Pulse 81   Temp 98 F (36.7 C) (Oral)   Resp 16   Ht 5\' 1"  (1.549 m)   Wt 86.6 kg   SpO2 95%   BMI 36.09 kg/m   Physical Exam  Constitutional: She is oriented to person, place, and time. She appears well-developed  and well-nourished. No distress.  HENT:  Head: Normocephalic and atraumatic.  Eyes: Conjunctivae are normal. No scleral icterus.  Neck: Normal range of motion.  Cardiovascular: Normal rate, regular rhythm and normal heart sounds.  Exam reveals no gallop and no friction rub.   No murmur heard. Pulmonary/Chest: Effort normal and breath sounds normal. No respiratory distress.  Abdominal: Soft. Bowel sounds are normal. She exhibits no distension and no mass. There is no tenderness. There is no guarding.  Neurological: She is alert and oriented to person, place, and time.  Speech is clear and goal oriented, follows commands Major Cranial nerves without deficit, no facial droop Normal strength in upper and lower extremities bilaterally including dorsiflexion and plantar flexion, strong and equal grip strength Sensation normal to light and sharp touch Moves extremities without ataxia, coordination intact Normal finger to nose and rapid alternating movements Neg romberg, no pronator drift    Skin: Skin is warm and dry. She is not diaphoretic.  Nursing note and vitals reviewed.    ED Treatments / Results  Labs (all labs ordered are listed, but only abnormal results are displayed) Labs Reviewed - No data to display  EKG  EKG Interpretation None       Radiology No results found.  Procedures Procedures (including critical care  time)  Medications Ordered in ED Medications - No data to display   Initial Impression / Assessment and Plan / ED Course  I have reviewed the triage vital signs and the nursing notes.  Pertinent labs & imaging results that were available during my care of the patient were reviewed by me and considered in my medical decision making (see chart for details).  Clinical Course    Patient with no apparent cause of syncope tonight. She does have an elevation in her creatinine, but negative orthostatics.patient will need admission for syncope. I have given sign out to Dr. Leonides Schanz.  Final Clinical Impressions(s) / ED Diagnoses   Final diagnoses:  None    New Prescriptions New Prescriptions   No medications on file     Margarita Mail, PA-C 07/02/16 1607

## 2016-06-28 NOTE — ED Triage Notes (Signed)
Pt in by ems after a fall this evening.  Pt states she has been dizzy today but cannot say why she fell.   Pt has pain to left side of face and right knee.

## 2016-06-29 ENCOUNTER — Observation Stay (HOSPITAL_COMMUNITY): Payer: Medicare Other

## 2016-06-29 ENCOUNTER — Observation Stay (HOSPITAL_BASED_OUTPATIENT_CLINIC_OR_DEPARTMENT_OTHER): Payer: Medicare Other

## 2016-06-29 ENCOUNTER — Emergency Department (HOSPITAL_COMMUNITY): Payer: Medicare Other

## 2016-06-29 DIAGNOSIS — R609 Edema, unspecified: Secondary | ICD-10-CM | POA: Diagnosis not present

## 2016-06-29 DIAGNOSIS — N289 Disorder of kidney and ureter, unspecified: Secondary | ICD-10-CM

## 2016-06-29 DIAGNOSIS — R55 Syncope and collapse: Secondary | ICD-10-CM | POA: Diagnosis present

## 2016-06-29 DIAGNOSIS — E119 Type 2 diabetes mellitus without complications: Secondary | ICD-10-CM

## 2016-06-29 DIAGNOSIS — M25561 Pain in right knee: Secondary | ICD-10-CM | POA: Diagnosis not present

## 2016-06-29 DIAGNOSIS — D649 Anemia, unspecified: Secondary | ICD-10-CM | POA: Diagnosis not present

## 2016-06-29 DIAGNOSIS — R600 Localized edema: Secondary | ICD-10-CM | POA: Diagnosis not present

## 2016-06-29 DIAGNOSIS — R42 Dizziness and giddiness: Secondary | ICD-10-CM | POA: Diagnosis not present

## 2016-06-29 DIAGNOSIS — I6522 Occlusion and stenosis of left carotid artery: Secondary | ICD-10-CM | POA: Diagnosis not present

## 2016-06-29 LAB — ECHOCARDIOGRAM COMPLETE
AVPHT: 786 ms
E/e' ratio: 10.33
EWDT: 268 ms
FS: 42 % (ref 28–44)
Height: 61 in
IV/PV OW: 0.99
LADIAMINDEX: 1.67 cm/m2
LASIZE: 31 mm
LAVOL: 44.8 mL
LAVOLA4C: 45.5 mL
LAVOLIN: 24.1 mL/m2
LEFT ATRIUM END SYS DIAM: 31 mm
LV PW d: 14.5 mm — AB (ref 0.6–1.1)
LV TDI E'MEDIAL: 5
LV e' LATERAL: 8.27 cm/s
LVEEAVG: 10.33
LVEEMED: 10.33
LVOT area: 3.14 cm2
LVOT diameter: 20 mm
MV Dec: 268
MVPG: 3 mmHg
MVPKAVEL: 109 m/s
MVPKEVEL: 85.4 m/s
TAPSE: 17.4 mm
TDI e' lateral: 8.27
Weight: 3105.6 oz

## 2016-06-29 LAB — URINE MICROSCOPIC-ADD ON: RBC / HPF: NONE SEEN RBC/hpf (ref 0–5)

## 2016-06-29 LAB — BASIC METABOLIC PANEL
Anion gap: 7 (ref 5–15)
BUN: 18 mg/dL (ref 6–20)
CHLORIDE: 102 mmol/L (ref 101–111)
CO2: 26 mmol/L (ref 22–32)
Calcium: 9.2 mg/dL (ref 8.9–10.3)
Creatinine, Ser: 1.3 mg/dL — ABNORMAL HIGH (ref 0.44–1.00)
GFR calc Af Amer: 44 mL/min — ABNORMAL LOW (ref >60)
GFR, EST NON AFRICAN AMERICAN: 38 mL/min — AB (ref >60)
GLUCOSE: 115 mg/dL — AB (ref 65–99)
POTASSIUM: 4.5 mmol/L (ref 3.5–5.1)
Sodium: 135 mmol/L (ref 135–145)

## 2016-06-29 LAB — URINALYSIS, ROUTINE W REFLEX MICROSCOPIC
GLUCOSE, UA: NEGATIVE mg/dL
HGB URINE DIPSTICK: NEGATIVE
Ketones, ur: 15 mg/dL — AB
Nitrite: NEGATIVE
Specific Gravity, Urine: >1.03 — ABNORMAL HIGH (ref 1.005–1.030)
pH: 5.5 (ref 5.0–8.0)

## 2016-06-29 LAB — GLUCOSE, CAPILLARY
GLUCOSE-CAPILLARY: 106 mg/dL — AB (ref 65–99)
GLUCOSE-CAPILLARY: 120 mg/dL — AB (ref 65–99)

## 2016-06-29 LAB — TROPONIN I: Troponin I: <0.03 ng/mL (ref <0.03)

## 2016-06-29 MED ORDER — SIMVASTATIN 20 MG PO TABS
40.0000 mg | ORAL_TABLET | Freq: Every day | ORAL | Status: DC
  Administered 2016-06-29: 40 mg via ORAL
  Filled 2016-06-29: qty 2

## 2016-06-29 MED ORDER — OXYCODONE HCL 5 MG PO TABS
5.0000 mg | ORAL_TABLET | ORAL | Status: DC | PRN
  Administered 2016-06-29: 5 mg via ORAL
  Filled 2016-06-29: qty 1

## 2016-06-29 MED ORDER — ASPIRIN EC 81 MG PO TBEC
81.0000 mg | DELAYED_RELEASE_TABLET | Freq: Every morning | ORAL | Status: DC
  Administered 2016-06-29: 81 mg via ORAL
  Filled 2016-06-29: qty 1

## 2016-06-29 MED ORDER — LOSARTAN POTASSIUM 50 MG PO TABS
25.0000 mg | ORAL_TABLET | Freq: Every day | ORAL | Status: DC
  Administered 2016-06-29: 25 mg via ORAL
  Filled 2016-06-29: qty 1

## 2016-06-29 MED ORDER — AMLODIPINE BESYLATE 5 MG PO TABS
10.0000 mg | ORAL_TABLET | Freq: Every day | ORAL | Status: DC
  Administered 2016-06-29: 10 mg via ORAL
  Filled 2016-06-29: qty 2

## 2016-06-29 MED ORDER — PANTOPRAZOLE SODIUM 40 MG PO TBEC
40.0000 mg | DELAYED_RELEASE_TABLET | Freq: Every day | ORAL | Status: DC
  Administered 2016-06-29: 40 mg via ORAL
  Filled 2016-06-29: qty 1

## 2016-06-29 MED ORDER — SODIUM CHLORIDE 0.9 % IV BOLUS (SEPSIS)
1000.0000 mL | Freq: Once | INTRAVENOUS | Status: AC
  Administered 2016-06-29: 1000 mL via INTRAVENOUS

## 2016-06-29 MED ORDER — RISAQUAD PO CAPS
1.0000 | ORAL_CAPSULE | Freq: Every morning | ORAL | Status: DC
  Administered 2016-06-29: 1 via ORAL
  Filled 2016-06-29: qty 1

## 2016-06-29 MED ORDER — ACETAMINOPHEN 650 MG RE SUPP: 650.0000 mg | Freq: Four times a day (QID) | RECTAL | Status: DC | PRN

## 2016-06-29 MED ORDER — MELOXICAM 7.5 MG PO TABS
7.5000 mg | ORAL_TABLET | Freq: Two times a day (BID) | ORAL | Status: DC
  Administered 2016-06-29: 7.5 mg via ORAL
  Filled 2016-06-29 (×5): qty 1

## 2016-06-29 MED ORDER — ACETAMINOPHEN 325 MG PO TABS: 650.0000 mg | ORAL_TABLET | Freq: Four times a day (QID) | ORAL | Status: DC | PRN

## 2016-06-29 MED ORDER — GABAPENTIN 300 MG PO CAPS
300.0000 mg | ORAL_CAPSULE | Freq: Four times a day (QID) | ORAL | Status: DC
  Administered 2016-06-29: 300 mg via ORAL
  Filled 2016-06-29: qty 1

## 2016-06-29 MED ORDER — ALPRAZOLAM 1 MG PO TABS: 1.0000 mg | ORAL_TABLET | Freq: Four times a day (QID) | ORAL | Status: DC | PRN

## 2016-06-29 MED ORDER — ADULT MULTIVITAMIN W/MINERALS CH
1.0000 | ORAL_TABLET | Freq: Every day | ORAL | Status: DC
  Administered 2016-06-29: 1 via ORAL
  Filled 2016-06-29: qty 1

## 2016-06-29 MED ORDER — ENOXAPARIN SODIUM 40 MG/0.4ML ~~LOC~~ SOLN
40.0000 mg | SUBCUTANEOUS | Status: DC
  Administered 2016-06-29: 40 mg via SUBCUTANEOUS
  Filled 2016-06-29: qty 0.4

## 2016-06-29 MED ORDER — OCUVITE-LUTEIN PO CAPS
1.0000 | ORAL_CAPSULE | Freq: Every morning | ORAL | Status: DC
  Administered 2016-06-29: 1 via ORAL
  Filled 2016-06-29: qty 1

## 2016-06-29 MED ORDER — VITAMIN D 1000 UNITS PO TABS
1000.0000 [IU] | ORAL_TABLET | Freq: Two times a day (BID) | ORAL | Status: DC
  Administered 2016-06-29: 1000 [IU] via ORAL
  Filled 2016-06-29: qty 1

## 2016-06-29 MED ORDER — INSULIN ASPART 100 UNIT/ML ~~LOC~~ SOLN: 0.0000 [IU] | Freq: Three times a day (TID) | SUBCUTANEOUS | Status: DC

## 2016-06-29 MED ORDER — SODIUM CHLORIDE 0.9 % IV SOLN
INTRAVENOUS | Status: AC
  Administered 2016-06-29: 03:00:00 via INTRAVENOUS

## 2016-06-29 MED ORDER — VITAMIN C 500 MG PO TABS
500.0000 mg | ORAL_TABLET | Freq: Every morning | ORAL | Status: DC
  Administered 2016-06-29: 500 mg via ORAL
  Filled 2016-06-29: qty 1

## 2016-06-29 MED ORDER — INSULIN ASPART 100 UNIT/ML ~~LOC~~ SOLN: 0.0000 [IU] | Freq: Every day | SUBCUTANEOUS | Status: DC

## 2016-06-29 MED ORDER — METFORMIN HCL 500 MG PO TABS
500.0000 mg | ORAL_TABLET | Freq: Two times a day (BID) | ORAL | Status: DC
  Administered 2016-06-29: 500 mg via ORAL
  Filled 2016-06-29: qty 1

## 2016-06-29 MED ORDER — SODIUM CHLORIDE 0.9% FLUSH
3.0000 mL | Freq: Two times a day (BID) | INTRAVENOUS | Status: DC
  Administered 2016-06-29 (×2): 3 mL via INTRAVENOUS

## 2016-06-29 MED ORDER — MIRTAZAPINE 15 MG PO TABS: 15.0000 mg | ORAL_TABLET | Freq: Every day | ORAL | Status: DC

## 2016-06-29 NOTE — Progress Notes (Signed)
*  PRELIMINARY RESULTS* Echocardiogram 2D Echocardiogram has been performed.  Laurie Mcfarland 06/29/2016, 11:15 AM

## 2016-06-29 NOTE — Care Management Obs Status (Signed)
[facility] NOTIFICATION   Patient Details  Name: Laurie Mcfarland MRN: [id number] Date of Birth: November 01, 1934   Medicare Observation Status Notification Given:  No D/c home   Briant Sites, RN 06/29/2016, 2:13 PM

## 2016-06-29 NOTE — ED Provider Notes (Signed)
Medical screening examination/treatment/procedure(s) were conducted as a shared visit with non-physician practitioner(s) and myself.  I personally evaluated the patient during the encounter.   EKG Interpretation  Date/Time:  Friday June 28 2016 22:58:13 EDT Ventricular Rate:  75 PR Interval:    QRS Duration: 93 QT Interval:  376 QTC Calculation: 420 R Axis:   41 Text Interpretation:  Sinus rhythm Low voltage, precordial leads No significant change since last tracing Confirmed by WARD,  DO, KRISTEN [phone]) on 06/28/2016 11:08:42 PM      Pt is a 81 y.o. female with history of diabetes, hypertension, hyperlipidemia who lives at home alone who had an unwitnessed fall tonight. States she was walking in her kitchen when she felt dizzy and had palpitations and passed out. It appears she did strike her head but she is denying headache. Head CT shows no acute abnormality. Also combining of right knee pain with a negative x-ray. Labs today unremarkable other than creatinine of 1.3. She does have small ketones in her urine. Will give IV fluids. States previously when she had an episode like this she had a urinary tract infection. Urine does not show obvious infection today, just leukocytes and rare bacteria. It doesn't appear she has had a recent echocardiogram. I feel given her risk factors I recommend admission for cardiac monitoring, echocardiogram to evaluate for possible causes of syncope. PCP is Dr. Gerarda Fraction.  1:40 AM  Discussed patient's case with Hospitalist, Dr. Maudie Mercury.  Recommend admission to observation, telemetry bed.  I will place holding orders per their request. Patient and family (if present) updated with plan. Care transferred to hospitalist service.  I reviewed all nursing notes, vitals, pertinent old records, EKGs, labs, imaging (as available).    [place], DO 06/29/16 [phone]

## 2016-06-29 NOTE — Discharge Summary (Signed)
Physician Discharge Summary  Laurie Mcfarland [id number] DOB: 1935/02/28 DOA: 06/28/2016  PCP: Glo Herring., MD  Admit date: 06/28/2016 Discharge date: 06/29/2016  Recommendations for Outpatient Follow-up:  1. Follow-up syncope as needed 2. Consider weaning Xanax and narcotic to lowest effective dose  Follow-up Information    Glo Herring., MD .   Specialty:  Internal Medicine Why:  as needed Contact information: [address] [place] [id number] [phone]          Discharge Diagnoses:  1. Syncope, presumed vasovagal 2. HTN 3. DM type 2  Discharge Condition: Stable Disposition: Home  Diet recommendation: Carb modified  Filed Weights   06/28/16 2201 06/29/16 0231  Weight: 86.6 kg (191 lb) 88 kg (194 lb 1.6 oz)    History of present illness:  81 year old woman presented after an episode of syncope in her kitchen. No focal neurologic deficits were normal. She was referred for observation for monitoring of her cardiac status and further investigation.   Hospital Course:  Observed overnight, asymptomatic. Echo, carotid u/s and telemetry unremarkable. No evidence of CNS or cardiac event. Likely vasovagal. On frequent Xanax and also on narcotics which may increase fall risk.   1. Syncope. Presumably vasovagal. Imaging including CT head, neck, chest x-ray all reassuring. Echocardiogram and carotid ultrasound unremarkable. No signs or symptoms to suggest acute neurologic event or cardiac event. Telemetry sinus rhythm, troponin negative and EKG nonacute.  2. HTN. Stable. Continue home meds. Monitor carefully. 3. DM type 2. Stable. Continue SSI 4. Chronic intermittent right lower extremity edema. BLE venous u/s negative for DVT.  Consultants:  None  Procedures:  None  Antimicrobials:  None  Discharge Instructions  Discharge Instructions    Diet - low sodium heart healthy    Complete by:  As directed    Diet Carb Modified    Complete by:   As directed    Discharge instructions    Complete by:  As directed    Call your physician or seek immediate medical attention for passing out, dizziness, lightheadedness, weakness, numbness, palpitations, heart racing or worsening of condition.   Increase activity slowly    Complete by:  As directed        Medication List    TAKE these medications   ALPRAZolam 1 MG tablet Commonly known as:  XANAX Take 1 mg by mouth 4 (four) times daily as needed for anxiety. Nerves   amLODipine 10 MG tablet Commonly known as:  NORVASC Take 10 mg by mouth daily.   aspirin EC 81 MG tablet Take 81 mg by mouth every morning.   CRANBERRY FRUIT PO Take 1 capsule by mouth daily.   furosemide 20 MG tablet Commonly known as:  LASIX Take 20 mg by mouth daily as needed for fluid or edema.   gabapentin 300 MG capsule Commonly known as:  NEURONTIN Take 300 mg by mouth 4 (four) times daily.   Ginkgo Biloba 60 MG Caps Take 1 capsule by mouth daily.   losartan 25 MG tablet Commonly known as:  COZAAR Take 25 mg by mouth daily.   meloxicam 7.5 MG tablet Commonly known as:  MOBIC Take 7.5 mg by mouth 2 (two) times daily. Take 1 tablet bid   metFORMIN 500 MG tablet Commonly known as:  GLUCOPHAGE Take 500 mg by mouth 2 (two) times daily with a meal.   milk thistle 175 MG tablet Take 175 mg by mouth daily.   mirtazapine 15 MG tablet Commonly known as:  REMERON Take 15  mg by mouth at bedtime.   multivitamin with minerals Tabs tablet Take 1 tablet by mouth daily.   multivitamin-lutein Caps capsule Take 1 capsule by mouth every morning.   omeprazole 40 MG capsule Commonly known as:  PRILOSEC Take 40 mg by mouth daily.   OSTEO BI-FLEX ADV DOUBLE ST PO Take 1 tablet by mouth every morning.   oxyCODONE 5 MG immediate release tablet Commonly known as:  Oxy IR/ROXICODONE Take 1 tablet by mouth every 4 (four) hours as needed. For pain   potassium chloride SA 20 MEQ tablet Commonly known  as:  K-DUR,KLOR-CON Take 20 mEq by mouth every morning.   simvastatin 40 MG tablet Commonly known as:  ZOCOR Take 40 mg by mouth daily.   TRUBIOTICS Caps Take 1 capsule by mouth every morning.   vitamin C 500 MG tablet Commonly known as:  ASCORBIC ACID Take 500 mg by mouth every morning.   Vitamin D-3 1000 units Caps Take 1 capsule by mouth 2 (two) times daily.      Allergies  Allergen Reactions  . Penicillins Rash    The results of significant diagnostics from this hospitalization (including imaging, microbiology, ancillary and laboratory) are listed below for reference.    Significant Diagnostic Studies: Dg Chest 2 View  Result Date: 06/29/2016 CLINICAL DATA:  Pre syncopal EXAM: CHEST  2 VIEW COMPARISON:  03/30/2013 CXR FINDINGS: Low lung volumes with bibasilar atelectasis. Chronic elevation of right hemidiaphragm versus eventration. No pneumonic consolidation, effusion or pulmonary edema. No suspicious osseous abnormalities. IMPRESSION: Low lung volumes with bibasilar atelectasis and chronic eventration of the right hemidiaphragm. Electronically Signed   By: Ashley Royalty M.D.   On: 06/29/2016 00:18   Ct Head Wo Contrast  Result Date: 06/29/2016 CLINICAL DATA:  Initial evaluation for acute dizziness, presyncope. Fall. EXAM: CT HEAD WITHOUT CONTRAST CT CERVICAL SPINE WITHOUT CONTRAST TECHNIQUE: Multidetector CT imaging of the head and cervical spine was performed following the standard protocol without intravenous contrast. Multiplanar CT image reconstructions of the cervical spine were also generated. COMPARISON:  Prior study from 02/21/2015. FINDINGS: CT HEAD FINDINGS Brain: Cerebral atrophy with chronic microvascular ischemic disease. No acute intracranial hemorrhage. No evidence for acute large vessel territory infarct. No mass lesion, midline shift or mass effect. No hydrocephalus. No extra-axial fluid collection. Vascular: No hyperdense vessel. Scattered vascular  calcifications noted. Skull: Scalp soft tissues within normal limits. No calvarial fracture. Sinuses/Orbits: Globes and orbits within normal limits. Mild scattered mucosal thickening within the sphenoid sinus on the left. Paranasal sinuses are otherwise clear. No mastoid effusion. CT CERVICAL SPINE FINDINGS Alignment: Straightening of the normal cervical lordosis. No listhesis. Skull base and vertebrae: Skullbase intact. Normal C1-2 articulations are preserved. Dens is intact. Vertebral body height maintained. No acute fracture. Soft tissues and spinal canal: No acute soft tissue abnormality. Scattered vascular calcifications noted. Disc levels: Moderate degenerative spondylolysis at C5-6 and C6-7. Scattered facet arthropathy noted. Upper chest: Visualized upper chest is clear. Other: IMPRESSION: CT BRAIN: 1. No acute intracranial process. 2. Mild age-related cerebral atrophy with chronic microvascular ischemic disease. CT CERVICAL SPINE: 1. No acute traumatic injury within the cervical spine. 2. Moderate degenerative spondylolysis at C5-6 and C6-7. Electronically Signed   By: Jeannine Boga M.D.   On: 06/29/2016 01:24   Ct Cervical Spine Wo Contrast  Result Date: 06/29/2016 CLINICAL DATA:  Initial evaluation for acute dizziness, presyncope. Fall. EXAM: CT HEAD WITHOUT CONTRAST CT CERVICAL SPINE WITHOUT CONTRAST TECHNIQUE: Multidetector CT imaging of the head and cervical  spine was performed following the standard protocol without intravenous contrast. Multiplanar CT image reconstructions of the cervical spine were also generated. COMPARISON:  Prior study from 02/21/2015. FINDINGS: CT HEAD FINDINGS Brain: Cerebral atrophy with chronic microvascular ischemic disease. No acute intracranial hemorrhage. No evidence for acute large vessel territory infarct. No mass lesion, midline shift or mass effect. No hydrocephalus. No extra-axial fluid collection. Vascular: No hyperdense vessel. Scattered vascular  calcifications noted. Skull: Scalp soft tissues within normal limits. No calvarial fracture. Sinuses/Orbits: Globes and orbits within normal limits. Mild scattered mucosal thickening within the sphenoid sinus on the left. Paranasal sinuses are otherwise clear. No mastoid effusion. CT CERVICAL SPINE FINDINGS Alignment: Straightening of the normal cervical lordosis. No listhesis. Skull base and vertebrae: Skullbase intact. Normal C1-2 articulations are preserved. Dens is intact. Vertebral body height maintained. No acute fracture. Soft tissues and spinal canal: No acute soft tissue abnormality. Scattered vascular calcifications noted. Disc levels: Moderate degenerative spondylolysis at C5-6 and C6-7. Scattered facet arthropathy noted. Upper chest: Visualized upper chest is clear. Other: IMPRESSION: CT BRAIN: 1. No acute intracranial process. 2. Mild age-related cerebral atrophy with chronic microvascular ischemic disease. CT CERVICAL SPINE: 1. No acute traumatic injury within the cervical spine. 2. Moderate degenerative spondylolysis at C5-6 and C6-7. Electronically Signed   By: Jeannine Boga M.D.   On: 06/29/2016 01:24   US Carotid Bilateral  Result Date: 06/29/2016 CLINICAL DATA:  Syncope. History of hypertension, hyperlipidemia and diabetes. EXAM: BILATERAL CAROTID DUPLEX ULTRASOUND TECHNIQUE: Pearline Cables scale imaging, color Doppler and duplex ultrasound were performed of bilateral carotid and vertebral arteries in the neck. COMPARISON:  None. FINDINGS: Criteria: Quantification of carotid stenosis is based on velocity parameters that correlate the residual internal carotid diameter with NASCET-based stenosis levels, using the diameter of the distal internal carotid lumen as the denominator for stenosis measurement. The following velocity measurements were obtained: RIGHT ICA:  62/15 cm/sec CCA:  [id number] cm/sec SYSTOLIC ICA/CCA RATIO:  0.7 DIASTOLIC ICA/CCA RATIO:  1.6 ECA:  74 cm/sec LEFT ICA:  68/14 cm/sec CCA:   [id number] cm/sec SYSTOLIC ICA/CCA RATIO:  0.7 DIASTOLIC ICA/CCA RATIO:  1.2 ECA:  106 cm/sec RIGHT CAROTID ARTERY: Right common and internal carotid arteries are very tortuous. Velocities are normal. No focal plaque is identified and there is no evidence of right carotid stenosis. RIGHT VERTEBRAL ARTERY: Antegrade flow with normal waveform and velocity. LEFT CAROTID ARTERY: Small focal calcified plaque at the level of the left carotid bulb. Mild amount of eccentric calcified plaque at the left ICA origin. Velocities are normal and estimated left ICA stenosis is less than 50%. The internal carotid artery is moderately tortuous. LEFT VERTEBRAL ARTERY: Antegrade flow with normal waveform and velocity. IMPRESSION: No evidence of right ICA stenosis. Mild plaque at the origin of the left ICA with estimated stenosis of less than 50%. Electronically Signed   By: Aletta Edouard M.D.   On: 06/29/2016 11:45   US Venous Img Lower Bilateral  Result Date: 06/29/2016 CLINICAL DATA:  Bilateral lower extremity edema.  Fall yesterday. EXAM: BILATERAL LOWER EXTREMITY VENOUS DUPLEX ULTRASOUND TECHNIQUE: Doppler venous assessment of the bilateral lower extremity deep venous system was performed, including characterization of spectral flow, compressibility, and phasicity. COMPARISON:  None. FINDINGS: There is complete compressibility of the bilateral common femoral, femoral, and popliteal veins. Doppler analysis demonstrates respiratory phasicity and augmentation of flow with calf compression. No obvious superficial vein or calf vein thrombosis. Right 6.1 x 1.7 x 4.0 cm popliteal fossa Baker's cyst. Left 5.1 x  1.1 x 3.5 cm popliteal fossa Baker's cyst. IMPRESSION: No evidence of lower extremity DVT. Bilateral Baker's cysts. Electronically Signed   By: Marybelle Killings M.D.   On: 06/29/2016 11:04   Dg Knee Complete 4 Views Right  Result Date: 06/29/2016 CLINICAL DATA:  Generalized right knee pain, fall this evening EXAM: RIGHT KNEE -  COMPLETE 4+ VIEW COMPARISON:  None. FINDINGS: No fracture or dislocation is evident. Moderate patellofemoral narrowing with superior and inferior spurring. There is marked narrowing of the lateral compartment with subchondral sclerosis and bony spurring. Moderate narrowing of the medial compartment with bony spurring. Small suprapatellar joint effusion. IMPRESSION: 1. No acute osseous abnormality. 2. Moderate to marked degenerative changes of the right knee. Electronically Signed   By: Donavan Foil M.D.   On: 06/29/2016 01:27    Labs: Basic Metabolic Panel:  Recent Labs Lab 06/28/16 2235  NA 135  K 4.5  CL 102  CO2 26  GLUCOSE 115*  BUN 18  CREATININE 1.30*  CALCIUM 9.2   CBC:  Recent Labs Lab 06/28/16 2233  WBC 9.3  NEUTROABS 4.7  HGB 11.9*  HCT 36.7  MCV 92.7  PLT 220   Cardiac Enzymes:  Recent Labs Lab 06/29/16 0254 06/29/16 0849  TROPONINI <0.03 <0.03   CBG:  Recent Labs Lab 06/28/16 2231 06/29/16 0734 06/29/16 1115  GLUCAP 120* 106* 120*    Principal Problem:   Syncope Active Problems:   Renal insufficiency   Anemia   Edema   Time coordinating discharge: 35 minutes  Signed:  Murray Hodgkins, MD [facility] 06/29/2016, 2:03 PM   By signing my name below, I, Hilbert Odor, attest that this documentation has been prepared under the direction and in the presence of Caridad Silveira P. Sarajane Jews, MD. Electronically signed: Hilbert Odor, Scribe.  06/29/16   I personally performed the services described in this documentation. All medical record entries made by the scribe were at my direction. I have reviewed the chart and agree that the record reflects my personal performance and is accurate and complete. Murray Hodgkins, MD

## 2016-06-29 NOTE — H&P (Signed)
TRH H&P   Patient Demographics:    Laurie Mcfarland, is a 81 y.o. female  MRN: [id number]   DOB - 1934/09/10  Admit Date - 06/28/2016  Outpatient Primary MD for the patient is Glo Herring., MD  Referring MD/NP/PA:    Outpatient Specialists:     Patient coming from: home  Chief Complaint  Patient presents with  . Fall      HPI:    Laurie Mcfarland  is a 82 y.o. female, w hypertension, hyperlipidemia, Dm2, apparently fell,  There was some question of syncope.  Pt is not sure what happened.  Pt denies cp, palp, sob, lower ext edema, focal neurological symptoms.  Pt was brought to ED for evalulation, CT brain negative.  Pt will be admitted for possible syncope.     Review of systems:    In addition to the HPI above,  No Fever-chills, No Headache, No changes with Vision or hearing, No problems swallowing food or Liquids, No Chest pain, Cough or Shortness of Breath, No Abdominal pain, No Nausea or Vommitting, Bowel movements are regular, No Blood in stool or Urine, No dysuria, No new skin rashes or bruises, No new joints pains-aches,  No new weakness, tingling, numbness in any extremity, No recent weight gain or loss, No polyuria, polydypsia or polyphagia, No significant Mental Stressors.  A full 10 point Review of Systems was done, except as stated above, all other Review of Systems were negative.   With Past History of the following :    Past Medical History:  Diagnosis Date  . Anxiety   . Arthritis   . Complication of anesthesia    pt. states that she remembers parts of her surgery when she had her TAH  . Diabetes mellitus   . Fibromyalgia   . GERD (gastroesophageal reflux disease)   . Hyperlipidemia   . Hypertension   . Shortness of breath    with exertion      Past Surgical History:  Procedure Laterality Date  . ABDOMINAL HYSTERECTOMY    . CATARACT  EXTRACTION W/PHACO  12/10/2011   Procedure: CATARACT EXTRACTION PHACO AND INTRAOCULAR LENS PLACEMENT (IOC);  Surgeon: Elta Guadeloupe T. Gershon Crane, MD;  Location: [facility];  Service: Ophthalmology;  Laterality: Left;  CDE=10.72  . CATARACT EXTRACTION W/PHACO  01/14/2012   Procedure: CATARACT EXTRACTION PHACO AND INTRAOCULAR LENS PLACEMENT (IOC);  Surgeon: Elta Guadeloupe T. Gershon Crane, MD;  Location: [facility];  Service: Ophthalmology;  Laterality: Right;  CDE:11.63  . ESOPHAGOGASTRODUODENOSCOPY (EGD) WITH ESOPHAGEAL DILATION N/A 06/17/2013   [id number] esophagus-status post Venia Minks dilation. Hiatal hernia. Gastric erosions s/p bx  . foreign body removal     splinter removed from right side of face      Social History:     Social History  Substance Use Topics  . Smoking status: Former Smoker    Quit date: 08/30/1994  . Smokeless tobacco: Never Used  .  Alcohol use No     Lives - at home  Mobility -  Ambulates by self  Family History :     Family History  Problem Relation Age of Onset  . Alzheimer's disease Mother   . Cancer Father   . Cancer Sister   . Cancer Brother   . Pseudochol deficiency Neg Hx   . Malignant hyperthermia Neg Hx   . Hypotension Neg Hx   . Anesthesia problems Neg Hx   . Colon cancer Neg Hx       Home Medications:   Prior to Admission medications   Medication Sig Start Date End Date Taking? Authorizing Provider  ALPRAZolam Duanne Moron) 1 MG tablet Take 1 mg by mouth 4 (four) times daily as needed for anxiety. Nerves    Yes Historical Provider, MD  amLODipine (NORVASC) 10 MG tablet Take 10 mg by mouth daily.  05/19/13  Yes Historical Provider, MD  aspirin EC 81 MG tablet Take 81 mg by mouth every morning.    Yes Historical Provider, MD  Cholecalciferol (VITAMIN D-3) 1000 UNITS CAPS Take 1 capsule by mouth 2 (two) times daily.    Yes Historical Provider, MD  CRANBERRY FRUIT PO Take 1 capsule by mouth daily.   Yes Historical Provider, MD  furosemide (LASIX) 20 MG tablet Take 20 mg by mouth  daily as needed for fluid or edema.  06/01/13  Yes Historical Provider, MD  gabapentin (NEURONTIN) 300 MG capsule Take 300 mg by mouth 4 (four) times daily. 06/07/16  Yes Historical Provider, MD  Ginkgo Biloba 60 MG CAPS Take 1 capsule by mouth daily.    Yes Historical Provider, MD  losartan (COZAAR) 25 MG tablet Take 25 mg by mouth daily.   Yes Historical Provider, MD  meloxicam (MOBIC) 7.5 MG tablet Take 7.5 mg by mouth 2 (two) times daily. Take 1 tablet bid 02/06/15  Yes Historical Provider, MD  metFORMIN (GLUCOPHAGE) 500 MG tablet Take 500 mg by mouth 2 (two) times daily with a meal.    Yes Historical Provider, MD  milk thistle 175 MG tablet Take 175 mg by mouth daily.   Yes Historical Provider, MD  mirtazapine (REMERON) 15 MG tablet Take 15 mg by mouth at bedtime.   Yes Historical Provider, MD  Misc Natural Products (OSTEO BI-FLEX ADV DOUBLE ST PO) Take 1 tablet by mouth every morning.    Yes Historical Provider, MD  Multiple Vitamin (MULTIVITAMIN WITH MINERALS) TABS tablet Take 1 tablet by mouth daily.   Yes Historical Provider, MD  multivitamin-lutein (OCUVITE-LUTEIN) CAPS Take 1 capsule by mouth every morning.    Yes Historical Provider, MD  omeprazole (PRILOSEC) 40 MG capsule Take 40 mg by mouth daily.   Yes Historical Provider, MD  oxyCODONE (OXY IR/ROXICODONE) 5 MG immediate release tablet Take 1 tablet by mouth every 4 (four) hours as needed. For pain 06/15/16  Yes Historical Provider, MD  potassium chloride SA (K-DUR,KLOR-CON) 20 MEQ tablet Take 20 mEq by mouth every morning.    Yes Historical Provider, MD  simvastatin (ZOCOR) 40 MG tablet Take 40 mg by mouth daily. 06/06/16  Yes Historical Provider, MD  vitamin C (ASCORBIC ACID) 500 MG tablet Take 500 mg by mouth every morning.    Yes Historical Provider, MD  Probiotic Product (TRUBIOTICS) CAPS Take 1 capsule by mouth every morning.     Historical Provider, MD     Allergies:     Allergies  Allergen Reactions  . Penicillins Rash  Physical Exam:   Vitals  Blood pressure 115/55, pulse 69, temperature 98 F (36.7 C), temperature source Oral, resp. rate 14, height 5\' 1"  (1.549 m), weight 86.6 kg (191 lb), SpO2 94 %.   1. General  lying in bed in NAD,    2. Normal affect and insight, Not Suicidal or Homicidal, Awake Alert, Oriented X 3.  3. No F.N deficits, ALL C.Nerves Intact, Strength 5/5 all 4 extremities, Sensation intact all 4 extremities, Plantars down going.  4. Ears and Eyes appear Normal, Conjunctivae clear, PERRLA. Moist Oral Mucosa.  5. Supple Neck, No JVD, No cervical lymphadenopathy appriciated, No Carotid Bruits.  6. Symmetrical Chest wall movement, Good air movement bilaterally, CTAB.  7. RRR, No Gallops, Rubs. 1/6 sem rusb, No Parasternal Heave.  8. Positive Bowel Sounds, Abdomen Soft, No tenderness, No organomegaly appriciated,No rebound -guarding or rigidity.  9.  No Cyanosis, Normal Skin Turgor, No Skin Rash or Bruise.  10. Good muscle tone,  joints appear normal , no effusions, Normal ROM.  11. No Palpable Lymph Nodes in Neck or Axillae  ? Gait instability   Data Review:    CBC  Recent Labs Lab 06/28/16 2233  WBC 9.3  HGB 11.9*  HCT 36.7  PLT 220  MCV 92.7  MCH 30.1  MCHC 32.4  RDW 13.2  LYMPHSABS 3.7  MONOABS 0.7  EOSABS 0.2  BASOSABS 0.0   ------------------------------------------------------------------------------------------------------------------  Chemistries   Recent Labs Lab 06/28/16 2235  NA 135  K 4.5  CL 102  CO2 26  GLUCOSE 115*  BUN 18  CREATININE 1.30*  CALCIUM 9.2   ------------------------------------------------------------------------------------------------------------------ estimated creatinine clearance is 35.1 mL/min (by C-G formula based on SCr of 1.3 mg/dL (H)). ------------------------------------------------------------------------------------------------------------------ No results for input(s): TSH, T4TOTAL, T3FREE,  THYROIDAB in the last 72 hours.  Invalid input(s): FREET3  Coagulation profile No results for input(s): INR, PROTIME in the last 168 hours. ------------------------------------------------------------------------------------------------------------------- No results for input(s): DDIMER in the last 72 hours. -------------------------------------------------------------------------------------------------------------------  Cardiac Enzymes No results for input(s): CKMB, TROPONINI, MYOGLOBIN in the last 168 hours.  Invalid input(s): CK ------------------------------------------------------------------------------------------------------------------ No results found for: BNP   ---------------------------------------------------------------------------------------------------------------  Urinalysis    Component Value Date/Time   COLORURINE YELLOW 06/28/2016 [place] 06/28/2016 2312   LABSPEC >1.030 (H) 06/28/2016 2312   PHURINE 5.5 06/28/2016 [place] 06/28/2016 2312   HGBUR NEGATIVE 06/28/2016 2312   BILIRUBINUR SMALL (A) 06/28/2016 2312   KETONESUR 15 (A) 06/28/2016 2312   PROTEINUR TRACE (A) 06/28/2016 2312   UROBILINOGEN 0.2 07/13/2014 1021   NITRITE NEGATIVE 06/28/2016 2312   LEUKOCYTESUR SMALL (A) 06/28/2016 2312    ----------------------------------------------------------------------------------------------------------------   Imaging Results:    Dg Chest 2 View  Result Date: 06/29/2016 CLINICAL DATA:  Pre syncopal EXAM: CHEST  2 VIEW COMPARISON:  03/30/2013 CXR FINDINGS: Low lung volumes with bibasilar atelectasis. Chronic elevation of right hemidiaphragm versus eventration. No pneumonic consolidation, effusion or pulmonary edema. No suspicious osseous abnormalities. IMPRESSION: Low lung volumes with bibasilar atelectasis and chronic eventration of the right hemidiaphragm. Electronically Signed   By: Ashley Royalty M.D.   On: 06/29/2016  00:18   Ct Head Wo Contrast  Result Date: 06/29/2016 CLINICAL DATA:  Initial evaluation for acute dizziness, presyncope. Fall. EXAM: CT HEAD WITHOUT CONTRAST CT CERVICAL SPINE WITHOUT CONTRAST TECHNIQUE: Multidetector CT imaging of the head and cervical spine was performed following the standard protocol without intravenous contrast. Multiplanar CT image reconstructions of the cervical spine were also generated. COMPARISON:  Prior study from 02/21/2015. FINDINGS: CT HEAD FINDINGS Brain: Cerebral atrophy with chronic microvascular ischemic disease. No acute intracranial hemorrhage. No evidence for acute large vessel territory infarct. No mass lesion, midline shift or mass effect. No hydrocephalus. No extra-axial fluid collection. Vascular: No hyperdense vessel. Scattered vascular calcifications noted. Skull: Scalp soft tissues within normal limits. No calvarial fracture. Sinuses/Orbits: Globes and orbits within normal limits. Mild scattered mucosal thickening within the sphenoid sinus on the left. Paranasal sinuses are otherwise clear. No mastoid effusion. CT CERVICAL SPINE FINDINGS Alignment: Straightening of the normal cervical lordosis. No listhesis. Skull base and vertebrae: Skullbase intact. Normal C1-2 articulations are preserved. Dens is intact. Vertebral body height maintained. No acute fracture. Soft tissues and spinal canal: No acute soft tissue abnormality. Scattered vascular calcifications noted. Disc levels: Moderate degenerative spondylolysis at C5-6 and C6-7. Scattered facet arthropathy noted. Upper chest: Visualized upper chest is clear. Other: IMPRESSION: CT BRAIN: 1. No acute intracranial process. 2. Mild age-related cerebral atrophy with chronic microvascular ischemic disease. CT CERVICAL SPINE: 1. No acute traumatic injury within the cervical spine. 2. Moderate degenerative spondylolysis at C5-6 and C6-7. Electronically Signed   By: Jeannine Boga M.D.   On: 06/29/2016 01:24   Ct  Cervical Spine Wo Contrast  Result Date: 06/29/2016 CLINICAL DATA:  Initial evaluation for acute dizziness, presyncope. Fall. EXAM: CT HEAD WITHOUT CONTRAST CT CERVICAL SPINE WITHOUT CONTRAST TECHNIQUE: Multidetector CT imaging of the head and cervical spine was performed following the standard protocol without intravenous contrast. Multiplanar CT image reconstructions of the cervical spine were also generated. COMPARISON:  Prior study from 02/21/2015. FINDINGS: CT HEAD FINDINGS Brain: Cerebral atrophy with chronic microvascular ischemic disease. No acute intracranial hemorrhage. No evidence for acute large vessel territory infarct. No mass lesion, midline shift or mass effect. No hydrocephalus. No extra-axial fluid collection. Vascular: No hyperdense vessel. Scattered vascular calcifications noted. Skull: Scalp soft tissues within normal limits. No calvarial fracture. Sinuses/Orbits: Globes and orbits within normal limits. Mild scattered mucosal thickening within the sphenoid sinus on the left. Paranasal sinuses are otherwise clear. No mastoid effusion. CT CERVICAL SPINE FINDINGS Alignment: Straightening of the normal cervical lordosis. No listhesis. Skull base and vertebrae: Skullbase intact. Normal C1-2 articulations are preserved. Dens is intact. Vertebral body height maintained. No acute fracture. Soft tissues and spinal canal: No acute soft tissue abnormality. Scattered vascular calcifications noted. Disc levels: Moderate degenerative spondylolysis at C5-6 and C6-7. Scattered facet arthropathy noted. Upper chest: Visualized upper chest is clear. Other: IMPRESSION: CT BRAIN: 1. No acute intracranial process. 2. Mild age-related cerebral atrophy with chronic microvascular ischemic disease. CT CERVICAL SPINE: 1. No acute traumatic injury within the cervical spine. 2. Moderate degenerative spondylolysis at C5-6 and C6-7. Electronically Signed   By: Jeannine Boga M.D.   On: 06/29/2016 01:24   Dg Knee  Complete 4 Views Right  Result Date: 06/29/2016 CLINICAL DATA:  Generalized right knee pain, fall this evening EXAM: RIGHT KNEE - COMPLETE 4+ VIEW COMPARISON:  None. FINDINGS: No fracture or dislocation is evident. Moderate patellofemoral narrowing with superior and inferior spurring. There is marked narrowing of the lateral compartment with subchondral sclerosis and bony spurring. Moderate narrowing of the medial compartment with bony spurring. Small suprapatellar joint effusion. IMPRESSION: 1. No acute osseous abnormality. 2. Moderate to marked degenerative changes of the right knee. Electronically Signed   By: Donavan Foil M.D.   On: 06/29/2016 01:27      Assessment & Plan:    Active Problems:   Syncope  1. Syncope Tele Trop I q6h x3 Cardiac echo Carotid ultrasound PT to evaluate and work on gait instability  2. Renal insufficiency Ns iv Check cmp in am  3. Anemia Check cbc in am fobt  4. Dm2 fsbs ac and qhs, iss  5. Hypertension Cont current bp medications.   6. Edema ? Check lower ext ultrasound   DVT Prophylaxis Lovenox - SCDs   AM Labs Ordered, also please review Full Orders  Family Communication: Admission, patients condition and plan of care including tests being ordered have been discussed with the patient who indicate understanding and agree with the plan and Code Status.  Code Status FULL CODE  Likely DC to  home  Condition GUARDED    Consults called: none  Admission status: observation   Time spent in minutes : 45 minutes   Jani Gravel M.D on 06/29/2016 at 1:56 AM  Between 7am to 7pm - Pager - [phone]. After 7pm go to [email or web address] - password [facility]  [facility] - Office  [phone]

## 2016-06-29 NOTE — Progress Notes (Signed)
PROGRESS NOTE  NIMAH ILL [id number] DOB: 05/22/1935 DOA: 06/28/2016 PCP: Glo Herring., MD  Brief Narrative: 81 year old woman presented after an episode of syncope in her kitchen. No focal neurologic deficits were normal. She was referred for observation for monitoring of her cardiac status and further investigation.   Assessment/Plan: 1. Syncope. Presumably vasovagal. Imaging including CT head, neck, chest x-ray all reassuring. Echocardiogram and carotid ultrasound unremarkable. No signs or symptoms to suggest acute neurologic event or cardiac event. Telemetry sinus rhythm, troponin negative and EKG nonacute.  2. HTN. Stable. Continue home meds. Monitor carefully. 3. DM type 2. Stable. Continue SSI 4. Chronic intermittent right lower extremity edema. BLE venous u/s negative for DVT.   Asymptomatic, workup reassurring, suspect benign syncope.  Discharge home today.  DVT prophylaxis: SCDs Code Status: Full Family Communication: Daughter, Granddaughter, and grandsons at bedside Disposition Plan: Discharge home later today  Murray Hodgkins, MD  [facility] Direct contact: [phone] --[place] [place]  --[email or web address]; password [email or web address]  7PM-7AM contact night coverage as above 06/29/2016, 5:44 AM  LOS: 0 days   Consultants:  None  Procedures:  Echo Study Conclusions  - Left ventricle: The cavity size was normal. There was moderate   concentric hypertrophy. Systolic function was normal. The   estimated ejection fraction was in the range of 60% to 65%. Wall   motion was normal; there were no regional wall motion   abnormalities. There was an increased relative contribution of   atrial contraction to ventricular filling. Doppler parameters are   consistent with abnormal left ventricular relaxation (grade 1   diastolic dysfunction). - Aortic valve: Trileaflet; normal thickness, mildly calcified   leaflets. There was trivial  regurgitation.  Antimicrobials:  None  CC: Follow-up syncope  Interval history/Subjective:  Patient reports she was on her feet this evening, she was unaware she was going to pass out. She's been eating and drinking well lately. No chest pain. Granddaughter reports she passed out years ago in the context of a UTI. No focal neurologic deficits. Has had chronic right lower extremity edema which has been intermittent in nature.   ROS:  No dysuria. No CP. She denies nausea or vomiting.  Objective: Vitals:   06/28/16 2201 06/28/16 2300 06/29/16 0116 06/29/16 0231  BP:  (!) 128/43 115/55 (!) 113/47  Pulse:  73 69 75  Resp:  19 14 18   Temp:    97.7 F (36.5 C)  TempSrc:    Oral  SpO2:  95% 94% 96%  Weight: 86.6 kg (191 lb)   88 kg (194 lb 1.6 oz)  Height: 5\' 1"  (1.549 m)       Intake/Output Summary (Last 24 hours) at 06/29/16 0544 Last data filed at 06/29/16 0528  Gross per 24 hour  Intake              215 ml  Output                0 ml  Net              215 ml     Filed Weights   06/28/16 2201 06/29/16 0231  Weight: 86.6 kg (191 lb) 88 kg (194 lb 1.6 oz)    Exam:    Constitutional:  . Appears calm and comfortable ENT:  . grossly normal hearing  . Lips appear normal Respiratory:  . CTA bilaterally, no w/r/r.  . Respiratory effort normal. No retractions or accessory muscle use Cardiovascular:  . RRR, no m/r/g .  1+ on the left, 2+ on the right LE extremity edema    Telemetry SR Musculoskeletal:  o Moves all extremities with grossly normal tone and strength. Neurologic exam grossly normal  I have personally reviewed following labs and imaging studies:  Admission creatinine 1.30, appears to be above baseline of approximately 0.8  Troponin negative  CBC on admission was unremarkable  Urinalysis was equivocal, patient asymptomatic. Excellent CT head and neck no acute findings  Chest x-ray on admission no acute abnormalities.  Knee x-ray on admission no  acute abnormalities.  EKG on admission unremarkable  Scheduled Meds: . acidophilus  1 capsule Oral q morning - 10a  . amLODipine  10 mg Oral Daily  . aspirin EC  81 mg Oral q morning - 10a  . cholecalciferol  1,000 Units Oral BID  . enoxaparin (LOVENOX) injection  40 mg Subcutaneous Q24H  . gabapentin  300 mg Oral QID  . insulin aspart  0-5 Units Subcutaneous QHS  . insulin aspart  0-9 Units Subcutaneous TID WC  . losartan  25 mg Oral Daily  . meloxicam  7.5 mg Oral BID  . metFORMIN  500 mg Oral BID WC  . mirtazapine  15 mg Oral QHS  . multivitamin with minerals  1 tablet Oral Daily  . multivitamin-lutein  1 capsule Oral q morning - 10a  . pantoprazole  40 mg Oral Daily  . simvastatin  40 mg Oral Daily  . sodium chloride flush  3 mL Intravenous Q12H  . vitamin C  500 mg Oral q morning - 10a   Continuous Infusions: . sodium chloride 75 mL/hr at 06/29/16 0236    Principal Problem:   Syncope Active Problems:   Renal insufficiency   Anemia   Edema   LOS: 0 days      By signing my name below, I, Hilbert Odor, attest that this documentation has been prepared under the direction and in the presence of [place]. Sarajane Jews, MD. Electronically signed: Hilbert Odor, Scribe.  06/29/16, 9:05 AM  I personally performed the services described in this documentation. All medical record entries made by the scribe were at my direction. I have reviewed the chart and agree that the record reflects my personal performance and is accurate and complete. Murray Hodgkins, MD

## 2016-06-30 LAB — HEMOGLOBIN A1C
HEMOGLOBIN A1C: 6 % — AB (ref 4.8–5.6)
MEAN PLASMA GLUCOSE: 126 mg/dL

## 2016-07-19 ENCOUNTER — Ambulatory Visit (HOSPITAL_COMMUNITY)
Admission: RE | Admit: 2016-07-19 | Discharge: 2016-07-19 | Disposition: A | Discharge status: Home / Self Care | Payer: Medicare Other | Source: Ambulatory Visit | Attending: Internal Medicine | Admitting: Internal Medicine

## 2016-07-19 ENCOUNTER — Other Ambulatory Visit (HOSPITAL_COMMUNITY): Payer: Self-pay | Admitting: Internal Medicine

## 2016-07-19 DIAGNOSIS — M19071 Primary osteoarthritis, right ankle and foot: Secondary | ICD-10-CM | POA: Diagnosis not present

## 2016-07-19 DIAGNOSIS — M25571 Pain in right ankle and joints of right foot: Secondary | ICD-10-CM | POA: Insufficient documentation

## 2016-08-13 DIAGNOSIS — H524 Presbyopia: Secondary | ICD-10-CM | POA: Diagnosis not present

## 2016-08-13 DIAGNOSIS — E119 Type 2 diabetes mellitus without complications: Secondary | ICD-10-CM | POA: Diagnosis not present

## 2016-08-13 DIAGNOSIS — Z961 Presence of intraocular lens: Secondary | ICD-10-CM | POA: Diagnosis not present

## 2016-08-22 DIAGNOSIS — G894 Chronic pain syndrome: Secondary | ICD-10-CM | POA: Diagnosis not present

## 2016-08-22 DIAGNOSIS — Z1389 Encounter for screening for other disorder: Secondary | ICD-10-CM | POA: Diagnosis not present

## 2016-08-28 ENCOUNTER — Encounter (HOSPITAL_COMMUNITY): Payer: Self-pay | Admitting: Emergency Medicine

## 2016-08-28 ENCOUNTER — Emergency Department (HOSPITAL_COMMUNITY): Payer: Medicare Other

## 2016-08-28 ENCOUNTER — Emergency Department (HOSPITAL_COMMUNITY)
Admission: EM | Admit: 2016-08-28 | Discharge: 2016-08-29 | Disposition: A | Discharge status: Home / Self Care | Payer: Medicare Other | Attending: Emergency Medicine | Admitting: Emergency Medicine

## 2016-08-28 DIAGNOSIS — N3000 Acute cystitis without hematuria: Secondary | ICD-10-CM | POA: Diagnosis not present

## 2016-08-28 DIAGNOSIS — Y939 Activity, unspecified: Secondary | ICD-10-CM | POA: Diagnosis not present

## 2016-08-28 DIAGNOSIS — Z7982 Long term (current) use of aspirin: Secondary | ICD-10-CM | POA: Insufficient documentation

## 2016-08-28 DIAGNOSIS — E119 Type 2 diabetes mellitus without complications: Secondary | ICD-10-CM | POA: Insufficient documentation

## 2016-08-28 DIAGNOSIS — W01198A Fall on same level from slipping, tripping and stumbling with subsequent striking against other object, initial encounter: Secondary | ICD-10-CM | POA: Insufficient documentation

## 2016-08-28 DIAGNOSIS — I10 Essential (primary) hypertension: Secondary | ICD-10-CM | POA: Insufficient documentation

## 2016-08-28 DIAGNOSIS — Z87891 Personal history of nicotine dependence: Secondary | ICD-10-CM | POA: Diagnosis not present

## 2016-08-28 DIAGNOSIS — Z79899 Other long term (current) drug therapy: Secondary | ICD-10-CM | POA: Insufficient documentation

## 2016-08-28 DIAGNOSIS — T148XXA Other injury of unspecified body region, initial encounter: Secondary | ICD-10-CM | POA: Insufficient documentation

## 2016-08-28 DIAGNOSIS — Y999 Unspecified external cause status: Secondary | ICD-10-CM | POA: Diagnosis not present

## 2016-08-28 DIAGNOSIS — Z7984 Long term (current) use of oral hypoglycemic drugs: Secondary | ICD-10-CM | POA: Insufficient documentation

## 2016-08-28 DIAGNOSIS — Y929 Unspecified place or not applicable: Secondary | ICD-10-CM | POA: Diagnosis not present

## 2016-08-28 DIAGNOSIS — S0003XA Contusion of scalp, initial encounter: Secondary | ICD-10-CM | POA: Diagnosis not present

## 2016-08-28 DIAGNOSIS — S0990XA Unspecified injury of head, initial encounter: Secondary | ICD-10-CM | POA: Diagnosis not present

## 2016-08-28 MED ORDER — BACITRACIN-NEOMYCIN-POLYMYXIN 400-5-5000 EX OINT
TOPICAL_OINTMENT | Freq: Once | CUTANEOUS | Status: AC
  Administered 2016-08-28: 22:00:00 via TOPICAL
  Filled 2016-08-28: qty 1

## 2016-08-28 NOTE — ED Notes (Signed)
Pt states she propped up on the side of a car when she sled down landing on her right elbow. Pt denies any move in elbow & has good ROM. Pt has a skin tear noted above her right brow. Pt denies any LOC, family says she was up before they could get around to her.

## 2016-08-28 NOTE — ED Triage Notes (Signed)
Pt states she was propped against car and slipped falling. Pt denies any loc. Pt has lac to the right eye brow.

## 2016-08-28 NOTE — ED Provider Notes (Signed)
[facility] DEPT Provider Note   CSN: [id number] Arrival date & time: 08/28/16  1934     History   Chief Complaint Chief Complaint  Patient presents with  . Fall    HPI Laurie Mcfarland is a 81 y.o. female presenting with right forehead injury occurring from a fall prior to arrival tonight.  She was leaning against the hood of a car with her elbow when her elbow slipped causing her fall.  She hit her right forehead against the pavement causing bleeding, swelling and abrasion to this site.  She denies loc, neck pain, nausea, vomiting, dizziness, vision changes or any other injury sustained in the fall.  She has applied pressure and a dressing.  The wound swelling has greatly improved since application of ice upon arrival here.  Her tetanus is current. She is not on any blood thinners except for a daily asa 81 mg.  The history is provided by the patient and a relative.    Past Medical History:  Diagnosis Date  . Anxiety   . Arthritis   . Complication of anesthesia    pt. states that she remembers parts of her surgery when she had her TAH  . Diabetes mellitus   . Fibromyalgia   . GERD (gastroesophageal reflux disease)   . Hyperlipidemia   . Hypertension   . Shortness of breath    with exertion    Patient Active Problem List   Diagnosis Date Noted  . Syncope 06/29/2016  . Renal insufficiency 06/29/2016  . Anemia 06/29/2016  . Edema   . Mild dementia 03/01/2015  . Gait instability 03/01/2015  . Frequent falls 03/01/2015  . Memory loss 09/03/2014  . Essential hypertension 09/03/2014  . Hyperlipidemia 09/03/2014  . Diabetes type 2, controlled ([place]) 09/03/2014  . Confusion 07/13/2014  . UTI (lower urinary tract infection) 07/13/2014  . Acute encephalopathy 07/13/2014  . Fall at home 07/13/2014  . DM type 2 (diabetes mellitus, type 2) ([place]) 07/13/2014  . Esophageal dysphagia 06/09/2013  . GERD (gastroesophageal reflux disease) 06/09/2013  . Early satiety 06/09/2013     Past Surgical History:  Procedure Laterality Date  . ABDOMINAL HYSTERECTOMY    . CATARACT EXTRACTION W/PHACO  12/10/2011   Procedure: CATARACT EXTRACTION PHACO AND INTRAOCULAR LENS PLACEMENT (IOC);  Surgeon: Elta Guadeloupe T. Gershon Crane, MD;  Location: [facility];  Service: Ophthalmology;  Laterality: Left;  CDE=10.72  . CATARACT EXTRACTION W/PHACO  01/14/2012   Procedure: CATARACT EXTRACTION PHACO AND INTRAOCULAR LENS PLACEMENT (IOC);  Surgeon: Elta Guadeloupe T. Gershon Crane, MD;  Location: [facility];  Service: Ophthalmology;  Laterality: Right;  CDE:11.63  . ESOPHAGOGASTRODUODENOSCOPY (EGD) WITH ESOPHAGEAL DILATION N/A 06/17/2013   [id number] esophagus-status post Venia Minks dilation. Hiatal hernia. Gastric erosions s/p bx  . foreign body removal     splinter removed from right side of face    OB History    No data available       Home Medications    Prior to Admission medications   Medication Sig Start Date End Date Taking? Authorizing Provider  ALPRAZolam Duanne Moron) 1 MG tablet Take 1 mg by mouth 4 (four) times daily as needed for anxiety. Nerves     Historical Provider, MD  amLODipine (NORVASC) 10 MG tablet Take 10 mg by mouth daily.  05/19/13   Historical Provider, MD  aspirin EC 81 MG tablet Take 81 mg by mouth every morning.     Historical Provider, MD  Cholecalciferol (VITAMIN D-3) 1000 UNITS CAPS Take 1 capsule by mouth 2 (two)  times daily.     Historical Provider, MD  CRANBERRY FRUIT PO Take 1 capsule by mouth daily.    Historical Provider, MD  furosemide (LASIX) 20 MG tablet Take 20 mg by mouth daily as needed for fluid or edema.  06/01/13   Historical Provider, MD  gabapentin (NEURONTIN) 300 MG capsule Take 300 mg by mouth 4 (four) times daily. 06/07/16   Historical Provider, MD  Ginkgo Biloba 60 MG CAPS Take 1 capsule by mouth daily.     Historical Provider, MD  losartan (COZAAR) 25 MG tablet Take 25 mg by mouth daily.    Historical Provider, MD  meloxicam (MOBIC) 7.5 MG tablet Take 7.5 mg by mouth 2 (two)  times daily. Take 1 tablet bid 02/06/15   Historical Provider, MD  metFORMIN (GLUCOPHAGE) 500 MG tablet Take 500 mg by mouth 2 (two) times daily with a meal.     Historical Provider, MD  milk thistle 175 MG tablet Take 175 mg by mouth daily.    Historical Provider, MD  mirtazapine (REMERON) 15 MG tablet Take 15 mg by mouth at bedtime.    Historical Provider, MD  Misc Natural Products (OSTEO BI-FLEX ADV DOUBLE ST PO) Take 1 tablet by mouth every morning.     Historical Provider, MD  Multiple Vitamin (MULTIVITAMIN WITH MINERALS) TABS tablet Take 1 tablet by mouth daily.    Historical Provider, MD  multivitamin-lutein (OCUVITE-LUTEIN) CAPS Take 1 capsule by mouth every morning.     Historical Provider, MD  omeprazole (PRILOSEC) 40 MG capsule Take 40 mg by mouth daily.    Historical Provider, MD  oxyCODONE (OXY IR/ROXICODONE) 5 MG immediate release tablet Take 1 tablet by mouth every 4 (four) hours as needed. For pain 06/15/16   Historical Provider, MD  potassium chloride SA (K-DUR,KLOR-CON) 20 MEQ tablet Take 20 mEq by mouth every morning.     Historical Provider, MD  Probiotic Product (TRUBIOTICS) CAPS Take 1 capsule by mouth every morning.     Historical Provider, MD  simvastatin (ZOCOR) 40 MG tablet Take 40 mg by mouth daily. 06/06/16   Historical Provider, MD  vitamin C (ASCORBIC ACID) 500 MG tablet Take 500 mg by mouth every morning.     Historical Provider, MD    Family History Family History  Problem Relation Age of Onset  . Alzheimer's disease Mother   . Cancer Father   . Cancer Sister   . Cancer Brother   . Pseudochol deficiency Neg Hx   . Malignant hyperthermia Neg Hx   . Hypotension Neg Hx   . Anesthesia problems Neg Hx   . Colon cancer Neg Hx     Social History Social History  Substance Use Topics  . Smoking status: Former Smoker    Quit date: 08/30/1994  . Smokeless tobacco: Never Used  . Alcohol use No     Allergies   Penicillins   Review of Systems Review of  Systems  Constitutional: Negative for fever.  HENT: Negative for congestion and sore throat.   Eyes: Negative.   Respiratory: Negative for chest tightness and shortness of breath.   Cardiovascular: Negative for chest pain.  Gastrointestinal: Negative for abdominal pain, nausea and vomiting.  Genitourinary: Negative.   Musculoskeletal: Negative for arthralgias, joint swelling and neck pain.  Skin: Positive for wound. Negative for rash.  Neurological: Negative for dizziness, weakness, light-headedness, numbness and headaches.  Psychiatric/Behavioral: Negative.      Physical Exam Updated Vital Signs BP 120/60 (BP Location: Left Arm)  Pulse 82   Temp 97.9 F (36.6 C) (Oral)   Resp 17   Ht 5\' 2"  (1.575 m)   Wt 88 kg   SpO2 97%   BMI 35.48 kg/m   Physical Exam  Constitutional: She is oriented to person, place, and time. She appears well-developed and well-nourished.  HENT:  Head: Normocephalic and atraumatic.  Mouth/Throat: Oropharynx is clear and moist.  Eyes: EOM are normal. Pupils are equal, round, and reactive to light.  Neck: Normal range of motion. Neck supple.  Cardiovascular: Normal rate and normal heart sounds.   Pulmonary/Chest: Effort normal.  Abdominal: Soft. There is no tenderness.  Musculoskeletal: Normal range of motion. She exhibits no edema, tenderness or deformity.  Moving all extremities without deficit or pain.  Lymphadenopathy:    She has no cervical adenopathy.  Neurological: She is alert and oriented to person, place, and time. She has normal strength. No sensory deficit. Gait normal. GCS eye subscore is 4. GCS verbal subscore is 5. GCS motor subscore is 6.   Cranial nerves III-XII intact.  No pronator drift.  Skin: Skin is warm and dry. No rash noted.  Psychiatric: She has a normal mood and affect. Her speech is normal and behavior is normal. Thought content normal. Cognition and memory are normal.  Nursing note and vitals reviewed.    ED  Treatments / Results  Labs (all labs ordered are listed, but only abnormal results are displayed) Labs Reviewed  URINALYSIS, ROUTINE W REFLEX MICROSCOPIC    EKG  EKG Interpretation None       Radiology Ct Head Wo Contrast  Result Date: 08/28/2016 CLINICAL DATA:  Slipped and fell while leaning on car. RIGHT forehead laceration. No loss of consciousness. History of hypertension, diabetes. EXAM: CT HEAD WITHOUT CONTRAST TECHNIQUE: Contiguous axial images were obtained from the base of the skull through the vertex without intravenous contrast. COMPARISON:  CT HEAD June 28, 2016 FINDINGS: BRAIN: The ventricles and sulci are normal for age. No intraparenchymal hemorrhage, mass effect nor midline shift. Patchy supratentorial white matter hypodensities less than expected for patient's age, though non-specific are most compatible with chronic small vessel ischemic disease. No acute large vascular territory infarcts. No abnormal extra-axial fluid collections. Basal cisterns are patent. VASCULAR: Moderate calcific atherosclerosis of the carotid siphons. SKULL: No skull fracture. Moderate RIGHT frontal scalp hematoma without subcutaneous gas or radiopaque foreign bodies. SINUSES/ORBITS: The mastoid air-cells and included paranasal sinuses are well-aerated.The included ocular globes and orbital contents are non-suspicious. OTHER: None. IMPRESSION: Moderate RIGHT frontal scalp hematoma.  No skull fracture. No acute intracranial process.  Stable negative CT HEAD for age. Electronically Signed   By: Elon Alas M.D.   On: 08/28/2016 22:47    Procedures Procedures (including critical care time)  Medications Ordered in ED Medications  neomycin-bacitracin-polymyxin (NEOSPORIN) ointment ( Topical Given 08/28/16 2228)     Initial Impression / Assessment and Plan / ED Course  I have reviewed the triage vital signs and the nursing notes.  Pertinent labs & imaging results that were available during  my care of the patient were reviewed by me and considered in my medical decision making (see chart for details).  Clinical Course     11:57 PM Pts daughter now at bedside stating she tends to fall when she has a uti.  She does endorse occasional pain with urination, denies increased frequency. No fevers, nausea, vomiting or abdominal pain.  Will run UA prior to dc.  UA suggesting uti, culture sent.  Started on cipro, first dose given.  Her wound was cleaned and dressed, bacitracin to site.  Advised prn f/u with pcp.    Pt was seen by Dr.Zackowski prior to dc home.  Final Clinical Impressions(s) / ED Diagnoses   Final diagnoses:  Abrasion  Minor head injury, initial encounter    New Prescriptions New Prescriptions   No medications on file     Evalee Jefferson, PA-C 08/29/16 [place], MD 09/02/16 [phone]

## 2016-08-28 NOTE — ED Notes (Signed)
Family member in at discharge thinks pt may have UTI. PA in room when this was mentioned. Orders given.

## 2016-08-29 LAB — URINALYSIS, ROUTINE W REFLEX MICROSCOPIC
Bilirubin Urine: NEGATIVE
Glucose, UA: NEGATIVE mg/dL
Hgb urine dipstick: NEGATIVE
KETONES UR: NEGATIVE mg/dL
Nitrite: NEGATIVE
PH: 5 (ref 5.0–8.0)
PROTEIN: NEGATIVE mg/dL
Specific Gravity, Urine: 1.016 (ref 1.005–1.030)

## 2016-08-29 MED ORDER — CIPROFLOXACIN HCL 500 MG PO TABS: 500.0000 mg | ORAL_TABLET | Freq: Two times a day (BID) | ORAL | 0 refills | Status: DC

## 2016-08-29 MED ORDER — CIPROFLOXACIN HCL 250 MG PO TABS
500.0000 mg | ORAL_TABLET | Freq: Once | ORAL | Status: AC
  Administered 2016-08-29: 500 mg via ORAL
  Filled 2016-08-29: qty 2

## 2016-08-29 NOTE — ED Notes (Signed)
Pt alert & oriented x4, stable gait. Patient given discharge instructions, paperwork & prescription(s). Patient verbalized understanding. Pt left department in wheelchair escorted by staff. Pt left w/ no further questions. 

## 2016-08-29 NOTE — ED Provider Notes (Signed)
Medical screening examination/treatment/procedure(s) were conducted as a shared visit with non-physician practitioner(s) and myself.  I personally evaluated the patient during the encounter.   EKG Interpretation None      Results for orders placed or performed during the hospital encounter of 06/28/16  CBC WITH DIFFERENTIAL  Result Value Ref Range   WBC 9.3 4.0 - 10.5 K/uL   RBC 3.96 3.87 - 5.11 MIL/uL   Hemoglobin 11.9 (L) 12.0 - 15.0 g/dL   HCT 36.7 36.0 - 46.0 %   MCV 92.7 78.0 - 100.0 fL   MCH 30.1 26.0 - 34.0 pg   MCHC 32.4 30.0 - 36.0 g/dL   RDW 13.2 11.5 - 15.5 %   Platelets 220 150 - 400 K/uL   Neutrophils Relative % 51 %   Neutro Abs 4.7 1.7 - 7.7 K/uL   Lymphocytes Relative 39 %   Lymphs Abs 3.7 0.7 - 4.0 K/uL   Monocytes Relative 8 %   Monocytes Absolute 0.7 0.1 - 1.0 K/uL   Eosinophils Relative 2 %   Eosinophils Absolute 0.2 0.0 - 0.7 K/uL   Basophils Relative 0 %   Basophils Absolute 0.0 0.0 - 0.1 K/uL  Urinalysis, Routine w reflex microscopic (not at [facility])  Result Value Ref Range   Color, Urine YELLOW YELLOW   APPearance CLEAR CLEAR   Specific Gravity, Urine >1.030 (H) 1.005 - 1.030   pH 5.5 5.0 - 8.0   Glucose, UA NEGATIVE NEGATIVE mg/dL   Hgb urine dipstick NEGATIVE NEGATIVE   Bilirubin Urine SMALL (A) NEGATIVE   Ketones, ur 15 (A) NEGATIVE mg/dL   Protein, ur TRACE (A) NEGATIVE mg/dL   Nitrite NEGATIVE NEGATIVE   Leukocytes, UA SMALL (A) NEGATIVE  Basic metabolic panel  Result Value Ref Range   Sodium 135 135 - 145 mmol/L   Potassium 4.5 3.5 - 5.1 mmol/L   Chloride 102 101 - 111 mmol/L   CO2 26 22 - 32 mmol/L   Glucose, Bld 115 (H) 65 - 99 mg/dL   BUN 18 6 - 20 mg/dL   Creatinine, Ser 1.30 (H) 0.44 - 1.00 mg/dL   Calcium 9.2 8.9 - 10.3 mg/dL   GFR calc non Af Amer 38 (L) >60 mL/min   GFR calc Af Amer 44 (L) >60 mL/min   Anion gap 7 5 - 15  Urine microscopic-add on  Result Value Ref Range   Squamous Epithelial / LPF 0-5 (A) NONE SEEN   WBC, UA  0-5 0 - 5 WBC/hpf   RBC / HPF NONE SEEN 0 - 5 RBC/hpf   Bacteria, UA RARE (A) NONE SEEN  Troponin I  Result Value Ref Range   Troponin I <0.03 <0.03 ng/mL  Troponin I  Result Value Ref Range   Troponin I <0.03 <0.03 ng/mL  Troponin I  Result Value Ref Range   Troponin I <0.03 <0.03 ng/mL  Hemoglobin A1c  Result Value Ref Range   Hgb A1c MFr Bld 6.0 (H) 4.8 - 5.6 %   Mean Plasma Glucose 126 mg/dL  Glucose, capillary  Result Value Ref Range   Glucose-Capillary 106 (H) 65 - 99 mg/dL   Comment 1 Notify RN    Comment 2 Document in Chart   Glucose, capillary  Result Value Ref Range   Glucose-Capillary 120 (H) 65 - 99 mg/dL   Comment 1 Notify RN    Comment 2 Document in Chart   I-stat troponin, ED  (not at [facility], [facility])  Result Value Ref Range  Troponin i, poc 0.00 0.00 - 0.08 ng/mL   Comment 3          CBG monitoring, ED  Result Value Ref Range   Glucose-Capillary 120 (H) 65 - 99 mg/dL  ECHOCARDIOGRAM COMPLETE  Result Value Ref Range   Weight 3,105.6 oz   Height 61 in   BP 119/63 mmHg   LV PW d 14.5 (A) 0.6 - 1.1 mm   FS 42 28 - 44 %   LA vol 44.8 mL   LA ID, A-P, ES 31 mm   IVS/LV PW RATIO, ED .99    LV e' LATERAL 8.27 cm/s   LV E/e' medial 10.33    LV E/e'average 10.33    LA diam index 1.67 cm/m2   LA vol A4C 45.5 ml   P 1/2 time 786 ms   E decel time 268 msec   LVOT diameter 20 mm   LVOT area 3.14 cm2   Peak grad 3 mmHg   E/e' ratio 10.33    MV pk E vel 85.4 m/s   MV pk A vel 109 m/s   LA vol index 24.1 mL/m2   MV Dec 268    LA diam end sys 31.00 mm   TDI e' medial 5.00    TDI e' lateral 8.27    TAPSE 17.40 mm   Ct Head Wo Contrast  Result Date: 08/28/2016 CLINICAL DATA:  Slipped and fell while leaning on car. RIGHT forehead laceration. No loss of consciousness. History of hypertension, diabetes. EXAM: CT HEAD WITHOUT CONTRAST TECHNIQUE: Contiguous axial images were obtained from the base of the skull through the vertex without intravenous contrast.  COMPARISON:  CT HEAD June 28, 2016 FINDINGS: BRAIN: The ventricles and sulci are normal for age. No intraparenchymal hemorrhage, mass effect nor midline shift. Patchy supratentorial white matter hypodensities less than expected for patient's age, though non-specific are most compatible with chronic small vessel ischemic disease. No acute large vascular territory infarcts. No abnormal extra-axial fluid collections. Basal cisterns are patent. VASCULAR: Moderate calcific atherosclerosis of the carotid siphons. SKULL: No skull fracture. Moderate RIGHT frontal scalp hematoma without subcutaneous gas or radiopaque foreign bodies. SINUSES/ORBITS: The mastoid air-cells and included paranasal sinuses are well-aerated.The included ocular globes and orbital contents are non-suspicious. OTHER: None. IMPRESSION: Moderate RIGHT frontal scalp hematoma.  No skull fracture. No acute intracranial process.  Stable negative CT HEAD for age. Electronically Signed   By: Elon Alas M.D.   On: 08/28/2016 22:47   Patient seen by me. Patient had a fall head CT without any acute findings. Right frontal scalp hematoma no skull fracture. Patient's family members are concerned that she may have a urinary tract infection. A urinalysis has been ordered. Disposition will be based on urinalysis. Patient stable otherwise for discharge home. Patient alert.  Patient was propped up on the side of the car and slipped and fell. No loss of consciousness. Patient with laceration to the right eyebrow area. Wound care and repair as per physician assistant.   Fredia Sorrow, MD 08/29/16 [phone]

## 2016-08-30 LAB — URINE CULTURE: Special Requests: NORMAL

## 2016-10-22 DIAGNOSIS — Z1389 Encounter for screening for other disorder: Secondary | ICD-10-CM | POA: Diagnosis not present

## 2016-10-22 DIAGNOSIS — Z0001 Encounter for general adult medical examination with abnormal findings: Secondary | ICD-10-CM | POA: Diagnosis not present

## 2016-10-22 DIAGNOSIS — E1140 Type 2 diabetes mellitus with diabetic neuropathy, unspecified: Secondary | ICD-10-CM | POA: Diagnosis not present

## 2016-10-22 DIAGNOSIS — N3000 Acute cystitis without hematuria: Secondary | ICD-10-CM | POA: Diagnosis not present

## 2016-10-28 ENCOUNTER — Emergency Department (HOSPITAL_COMMUNITY): Payer: Medicare Other

## 2016-10-28 ENCOUNTER — Encounter (HOSPITAL_COMMUNITY): Payer: Self-pay | Admitting: Emergency Medicine

## 2016-10-28 ENCOUNTER — Emergency Department (HOSPITAL_COMMUNITY)
Admission: EM | Admit: 2016-10-28 | Discharge: 2016-10-29 | Disposition: A | Discharge status: Home / Self Care | Payer: Medicare Other | Attending: Emergency Medicine | Admitting: Emergency Medicine

## 2016-10-28 DIAGNOSIS — Z87891 Personal history of nicotine dependence: Secondary | ICD-10-CM | POA: Insufficient documentation

## 2016-10-28 DIAGNOSIS — N289 Disorder of kidney and ureter, unspecified: Secondary | ICD-10-CM | POA: Diagnosis not present

## 2016-10-28 DIAGNOSIS — R42 Dizziness and giddiness: Secondary | ICD-10-CM | POA: Insufficient documentation

## 2016-10-28 DIAGNOSIS — E119 Type 2 diabetes mellitus without complications: Secondary | ICD-10-CM | POA: Diagnosis not present

## 2016-10-28 DIAGNOSIS — I10 Essential (primary) hypertension: Secondary | ICD-10-CM | POA: Diagnosis not present

## 2016-10-28 DIAGNOSIS — R410 Disorientation, unspecified: Secondary | ICD-10-CM | POA: Diagnosis not present

## 2016-10-28 DIAGNOSIS — Z7982 Long term (current) use of aspirin: Secondary | ICD-10-CM | POA: Insufficient documentation

## 2016-10-28 DIAGNOSIS — R531 Weakness: Secondary | ICD-10-CM

## 2016-10-28 DIAGNOSIS — Z791 Long term (current) use of non-steroidal anti-inflammatories (NSAID): Secondary | ICD-10-CM | POA: Diagnosis not present

## 2016-10-28 DIAGNOSIS — Z79899 Other long term (current) drug therapy: Secondary | ICD-10-CM | POA: Insufficient documentation

## 2016-10-28 LAB — CBC WITH DIFFERENTIAL/PLATELET
BASOS ABS: 0 10*3/uL (ref 0.0–0.1)
Basophils Relative: 0 %
EOS ABS: 0.2 10*3/uL (ref 0.0–0.7)
Eosinophils Relative: 2 %
HCT: 33.4 % — ABNORMAL LOW (ref 36.0–46.0)
HEMOGLOBIN: 10.9 g/dL — AB (ref 12.0–15.0)
LYMPHS ABS: 2.5 10*3/uL (ref 0.7–4.0)
LYMPHS PCT: 25 %
MCH: 29.5 pg (ref 26.0–34.0)
MCHC: 32.6 g/dL (ref 30.0–36.0)
MCV: 90.3 fL (ref 78.0–100.0)
Monocytes Absolute: 0.9 10*3/uL (ref 0.1–1.0)
Monocytes Relative: 9 %
NEUTROS PCT: 64 %
Neutro Abs: 6.7 10*3/uL (ref 1.7–7.7)
Platelets: 208 10*3/uL (ref 150–400)
RBC: 3.7 MIL/uL — AB (ref 3.87–5.11)
RDW: 14.6 % (ref 11.5–15.5)
WBC: 10.3 10*3/uL (ref 4.0–10.5)

## 2016-10-28 NOTE — ED Triage Notes (Signed)
Weakness started yesterday.  Just finished abx for UTI.  Family reports pt is retaining fluid.

## 2016-10-28 NOTE — ED Provider Notes (Signed)
[facility] DEPT Provider Note   CSN: [id number] Arrival date & time: 10/28/16  2222   By signing my name below, I, Eunice Blase, attest that this documentation has been prepared under the direction and in the presence of Ripley Fraise, MD. Electronically signed, Eunice Blase, ED Scribe. 10/28/16. 11:22 PM.   History   Chief Complaint Chief Complaint  Patient presents with  . Weakness   The history is provided by the patient and medical records. No language interpreter was used.  Weakness  Primary symptoms include loss of balance, movement disorder, dizziness.  Primary symptoms include no focal weakness, no loss of sensation, no speech change, no memory loss, no visual change, no auditory change. This is a new problem. The current episode started yesterday. The problem has been gradually worsening. There was right lower extremity and left lower extremity focality noted. There has been no fever. The fever has been present for 1 to 2 days. Associated symptoms include confusion. Pertinent negatives include no shortness of breath, no chest pain, no vomiting, no altered mental status and no headaches. There were no medications administered prior to arrival. Associated medical issues do not include trauma, mood changes, a bleeding disorder, seizures or dementia.   HPI Comments: Laurie Mcfarland is a 82 y.o. female who presents to the Emergency Department complaining of gradually worsening weakness x 2-3 days. Pt's daughter notes associated leg swelling, loss of balance, low back pain and swelling, "hunching over", dizziness and confusion. Family adds the pt finished cipro for a kidney infection today. Hx of multiple UTI's and chronic SOB on exertion noted. She states she takes medications, including fluid pills, as directed at home, with no recent changes to any of them. Pt denies fever, headache, vomit, chest pain, SOB, injuries, bloody stool, syncope, light headedness or dysuria. No  trauma/falls reported Past Medical History:  Diagnosis Date  . Anxiety   . Arthritis   . Complication of anesthesia    pt. states that she remembers parts of her surgery when she had her TAH  . Diabetes mellitus   . Fibromyalgia   . GERD (gastroesophageal reflux disease)   . Hyperlipidemia   . Hypertension   . Shortness of breath    with exertion    Patient Active Problem List   Diagnosis Date Noted  . Syncope 06/29/2016  . Renal insufficiency 06/29/2016  . Anemia 06/29/2016  . Edema   . Mild dementia 03/01/2015  . Gait instability 03/01/2015  . Frequent falls 03/01/2015  . Memory loss 09/03/2014  . Essential hypertension 09/03/2014  . Hyperlipidemia 09/03/2014  . Diabetes type 2, controlled ([place]) 09/03/2014  . Confusion 07/13/2014  . UTI (lower urinary tract infection) 07/13/2014  . Acute encephalopathy 07/13/2014  . Fall at home 07/13/2014  . DM type 2 (diabetes mellitus, type 2) ([place]) 07/13/2014  . Esophageal dysphagia 06/09/2013  . GERD (gastroesophageal reflux disease) 06/09/2013  . Early satiety 06/09/2013    Past Surgical History:  Procedure Laterality Date  . ABDOMINAL HYSTERECTOMY    . CATARACT EXTRACTION W/PHACO  12/10/2011   Procedure: CATARACT EXTRACTION PHACO AND INTRAOCULAR LENS PLACEMENT (IOC);  Surgeon: Elta Guadeloupe T. Gershon Crane, MD;  Location: [facility];  Service: Ophthalmology;  Laterality: Left;  CDE=10.72  . CATARACT EXTRACTION W/PHACO  01/14/2012   Procedure: CATARACT EXTRACTION PHACO AND INTRAOCULAR LENS PLACEMENT (IOC);  Surgeon: Elta Guadeloupe T. Gershon Crane, MD;  Location: [facility];  Service: Ophthalmology;  Laterality: Right;  CDE:11.63  . ESOPHAGOGASTRODUODENOSCOPY (EGD) WITH ESOPHAGEAL DILATION N/A 06/17/2013  [id number] esophagus-status post [facility] dilation. Hiatal hernia. Gastric erosions s/p bx  . foreign body removal     splinter removed from right side of face    OB History    No data available       Home Medications    Prior to Admission medications     Medication Sig Start Date End Date Taking? Authorizing Provider  ALPRAZolam Duanne Moron) 1 MG tablet Take 1 mg by mouth 4 (four) times daily as needed for anxiety. Nerves     Historical Provider, MD  amLODipine (NORVASC) 10 MG tablet Take 10 mg by mouth daily.  05/19/13   Historical Provider, MD  aspirin EC 81 MG tablet Take 81 mg by mouth every morning.     Historical Provider, MD  Cholecalciferol (VITAMIN D-3) 1000 UNITS CAPS Take 1 capsule by mouth 2 (two) times daily.     Historical Provider, MD  ciprofloxacin (CIPRO) 500 MG tablet Take 1 tablet (500 mg total) by mouth 2 (two) times daily. 08/29/16   Evalee Jefferson, PA-C  CRANBERRY FRUIT PO Take 1 capsule by mouth daily.    Historical Provider, MD  furosemide (LASIX) 20 MG tablet Take 20 mg by mouth daily as needed for fluid or edema.  06/01/13   Historical Provider, MD  gabapentin (NEURONTIN) 300 MG capsule Take 300 mg by mouth 4 (four) times daily. 06/07/16   Historical Provider, MD  Ginkgo Biloba 60 MG CAPS Take 1 capsule by mouth daily.     Historical Provider, MD  losartan (COZAAR) 25 MG tablet Take 25 mg by mouth daily.    Historical Provider, MD  meloxicam (MOBIC) 7.5 MG tablet Take 7.5 mg by mouth 2 (two) times daily. Take 1 tablet bid 02/06/15   Historical Provider, MD  metFORMIN (GLUCOPHAGE) 500 MG tablet Take 500 mg by mouth 2 (two) times daily with a meal.     Historical Provider, MD  milk thistle 175 MG tablet Take 175 mg by mouth daily.    Historical Provider, MD  mirtazapine (REMERON) 15 MG tablet Take 15 mg by mouth at bedtime.    Historical Provider, MD  Misc Natural Products (OSTEO BI-FLEX ADV DOUBLE ST PO) Take 1 tablet by mouth every morning.     Historical Provider, MD  Multiple Vitamin (MULTIVITAMIN WITH MINERALS) TABS tablet Take 1 tablet by mouth daily.    Historical Provider, MD  multivitamin-lutein (OCUVITE-LUTEIN) CAPS Take 1 capsule by mouth every morning.     Historical Provider, MD  omeprazole (PRILOSEC) 40 MG capsule Take 40  mg by mouth daily.    Historical Provider, MD  oxyCODONE (OXY IR/ROXICODONE) 5 MG immediate release tablet Take 1 tablet by mouth every 4 (four) hours as needed. For pain 06/15/16   Historical Provider, MD  potassium chloride SA (K-DUR,KLOR-CON) 20 MEQ tablet Take 20 mEq by mouth every morning.     Historical Provider, MD  Probiotic Product (TRUBIOTICS) CAPS Take 1 capsule by mouth every morning.     Historical Provider, MD  simvastatin (ZOCOR) 40 MG tablet Take 40 mg by mouth daily. 06/06/16   Historical Provider, MD  vitamin C (ASCORBIC ACID) 500 MG tablet Take 500 mg by mouth every morning.     Historical Provider, MD    Family History Family History  Problem Relation Age of Onset  . Alzheimer's disease Mother   . Cancer Father   . Cancer Sister   . Cancer Brother   . Pseudochol deficiency Neg Hx   . Malignant hyperthermia  Neg Hx   . Hypotension Neg Hx   . Anesthesia problems Neg Hx   . Colon cancer Neg Hx     Social History Social History  Substance Use Topics  . Smoking status: Former Smoker    Quit date: 08/30/1994  . Smokeless tobacco: Never Used  . Alcohol use No     Allergies   Penicillins   Review of Systems Review of Systems  Respiratory: Negative for shortness of breath.   Cardiovascular: Negative for chest pain.  Gastrointestinal: Negative for vomiting.  Neurological: Positive for dizziness, weakness and loss of balance. Negative for speech change, focal weakness and headaches.  Psychiatric/Behavioral: Positive for confusion. Negative for memory loss.  All other systems reviewed and are negative.  A complete 10 system review of systems was obtained and all systems are negative except as noted in the HPI and PMH.    Physical Exam Updated Vital Signs BP (!) 124/54 (BP Location: Left Arm)   Pulse 101   Temp 97.9 F (36.6 C) (Oral)   Resp 20   Ht 5\' 3"  (1.6 m)   Wt 194 lb (88 kg)   SpO2 95%   BMI 34.37 kg/m   Physical Exam CONSTITUTIONAL: Well  developed/well nourished HEAD: Normocephalic/atraumatic EYES: EOMI/PERRL ENMT: Mucous membranes moist NECK: supple no meningeal signs SPINE/BACK:entire spine nontender CV: S1/S2 noted, murmur noted. LUNGS: Lungs are clear to auscultation bilaterally, no apparent distress ABDOMEN: soft, nontender, no rebound or guarding, bowel sounds noted throughout abdomen GU:no cva tenderness NEURO: Pt is awake/alert/appropriate, moves all extremitiesx4.  No facial droop. No arm or leg drift. No pastpointing.  Patient answers questions appropriately.  She is conversant EXTREMITIES: pulses normal/equal, full ROM, minimal edema to LE SKIN: warm, color normal PSYCH: no abnormalities of mood noted, alert and oriented to situation   ED Treatments / Results  DIAGNOSTIC STUDIES: Oxygen Saturation is 95% on RA, adequate by my interpretation.    COORDINATION OF CARE: 11:19 PM Discussed treatment plan with pt at bedside and pt agreed to plan. Will order labs, imaging and reassess.  Labs (all labs ordered are listed, but only abnormal results are displayed) Labs Reviewed  BASIC METABOLIC PANEL - Abnormal; Notable for the following:       Result Value   Chloride 99 (*)    Glucose, Bld 135 (*)    Creatinine, Ser 1.79 (*)    Calcium 8.6 (*)    GFR calc non Af Amer 26 (*)    GFR calc Af Amer 30 (*)    All other components within normal limits  CBC WITH DIFFERENTIAL/PLATELET - Abnormal; Notable for the following:    RBC 3.70 (*)    Hemoglobin 10.9 (*)    HCT 33.4 (*)    All other components within normal limits  URINALYSIS, ROUTINE W REFLEX MICROSCOPIC - Abnormal; Notable for the following:    APPearance HAZY (*)    Leukocytes, UA LARGE (*)    Bacteria, UA RARE (*)    All other components within normal limits  URINE CULTURE  TROPONIN I    EKG  EKG Interpretation  Date/Time:  Monday October 28 2016 22:56:22 EST Ventricular Rate:  92 PR Interval:    QRS Duration: 85 QT Interval:  357 QTC  Calculation: 442 R Axis:   59 Text Interpretation:  Sinus Laurie Low voltage, precordial leads Baseline wander in lead(s) V4 No significant change since last tracing Confirmed by Christy Gentles  MD, Bellatrix Devonshire ([id number]) on 10/28/2016 11:08:10 PM  Radiology Dg Chest 2 View  Result Date: 10/29/2016 CLINICAL DATA:  Weakness for 2 days. EXAM: CHEST  2 VIEW COMPARISON:  06/28/2016 FINDINGS: Chronic eventration of right hemidiaphragm with adjacent atelectasis or scarring. Unchanged heart size and mediastinal contours. Atherosclerosis of the aortic arch. No pulmonary edema, focal airspace disease, pleural fluid or pneumothorax. No acute osseous abnormalities. IMPRESSION: No acute abnormality. Chronic eventration of right hemidiaphragm with adjacent atelectasis or scarring. Thoracic aortic atherosclerosis. Electronically Signed   By: Jeb Levering M.D.   On: 10/29/2016 00:17   Ct Head Wo Contrast  Result Date: 10/29/2016 CLINICAL DATA:  Acute onset of gradually worsening weakness. Leg swelling, loss of balance, lower back pain and swelling, dizziness and confusion. Initial encounter. EXAM: CT HEAD WITHOUT CONTRAST TECHNIQUE: Contiguous axial images were obtained from the base of the skull through the vertex without intravenous contrast. COMPARISON:  CT of the head performed 08/28/2016 FINDINGS: Brain: No evidence of acute infarction, hemorrhage, hydrocephalus, extra-axial collection or mass lesion/mass effect. Prominence of the ventricles and sulci reflects mild to moderate cortical volume loss. Mild cerebellar atrophy is noted. Scattered periventricular white matter change likely reflects small vessel ischemic microangiopathy. The brainstem and fourth ventricle are within normal limits. The basal ganglia are unremarkable in appearance. The cerebral hemispheres demonstrate grossly normal gray-white differentiation. No mass effect or midline shift is seen. Vascular: No hyperdense vessel or unexpected calcification.  Skull: There is no evidence of fracture; visualized osseous structures are unremarkable in appearance. Sinuses/Orbits: The visualized portions of the orbits are within normal limits. The paranasal sinuses and mastoid air cells are well-aerated. Other: No significant soft tissue abnormalities are seen. IMPRESSION: 1. No acute intracranial pathology seen on CT. 2. Mild to moderate cortical volume loss and scattered small vessel ischemic microangiopathy. Electronically Signed   By: Garald Balding M.D.   On: 10/29/2016 00:53    Procedures Procedures   Medications Ordered in ED Medications  sodium chloride 0.9 % bolus 1,000 mL (0 mLs Intravenous Stopped 10/29/16 0159)     Initial Impression / Assessment and Plan / ED Course  I have reviewed the triage vital signs and the nursing notes.  Pertinent labs & imaging results that were available during my care of the patient were reviewed by me and considered in my medical decision making (see chart for details).     Patient very well appearing She has had increased generalized weakness/fatigue and reported brief episodes of confusion.  She also has reported lightheadedness upon standing Family denies focal weakness/slurred speech Other than mild renal insufficiency, labs/imaging negative Pt at baseline She ambulated at her baseline No confusion or focal weakness noted  For renal insuffiencey, plan to STOP lasix and f/u in one week for recheck BMP Urine sent for culture but not convinced this is acute UTI Otherwise well appearing, no signs of acute TIA/CVA Suspect confusion may be due to meds (oxycodone/xanax/remeron  Pt is well cared for and family helps her with all meds Patient/family feel comfortable going home    I personally performed the services described in this documentation, which was scribed in my presence. The recorded information has been reviewed and is accurate.  Final Clinical Impressions(s) / ED Diagnoses   Final  diagnoses:  Weakness  Renal insufficiency    New Prescriptions New Prescriptions   No medications on file     Ripley Fraise, MD 10/29/16 [phone]

## 2016-10-29 ENCOUNTER — Emergency Department (HOSPITAL_COMMUNITY): Payer: Medicare Other

## 2016-10-29 DIAGNOSIS — R531 Weakness: Secondary | ICD-10-CM | POA: Diagnosis not present

## 2016-10-29 DIAGNOSIS — R410 Disorientation, unspecified: Secondary | ICD-10-CM | POA: Diagnosis not present

## 2016-10-29 LAB — URINALYSIS, ROUTINE W REFLEX MICROSCOPIC
BILIRUBIN URINE: NEGATIVE
GLUCOSE, UA: NEGATIVE mg/dL
Hgb urine dipstick: NEGATIVE
KETONES UR: NEGATIVE mg/dL
Nitrite: NEGATIVE
PH: 5 (ref 5.0–8.0)
PROTEIN: NEGATIVE mg/dL
Specific Gravity, Urine: 1.012 (ref 1.005–1.030)

## 2016-10-29 LAB — BASIC METABOLIC PANEL
ANION GAP: 11 (ref 5–15)
BUN: 20 mg/dL (ref 6–20)
CALCIUM: 8.6 mg/dL — AB (ref 8.9–10.3)
CO2: 25 mmol/L (ref 22–32)
Chloride: 99 mmol/L — ABNORMAL LOW (ref 101–111)
Creatinine, Ser: 1.79 mg/dL — ABNORMAL HIGH (ref 0.44–1.00)
GFR, EST AFRICAN AMERICAN: 30 mL/min — AB (ref >60)
GFR, EST NON AFRICAN AMERICAN: 26 mL/min — AB (ref >60)
GLUCOSE: 135 mg/dL — AB (ref 65–99)
POTASSIUM: 4.5 mmol/L (ref 3.5–5.1)
SODIUM: 135 mmol/L (ref 135–145)

## 2016-10-29 LAB — TROPONIN I: Troponin I: <0.03 ng/mL (ref <0.03)

## 2016-10-29 MED ORDER — SODIUM CHLORIDE 0.9 % IV BOLUS (SEPSIS)
1000.0000 mL | Freq: Once | INTRAVENOUS | Status: AC
  Administered 2016-10-29: 1000 mL via INTRAVENOUS

## 2016-10-29 NOTE — Discharge Instructions (Signed)

## 2016-10-29 NOTE — ED Notes (Signed)
Pt ambulated in hallway with minimal assistance. Dr. Christy Gentles in observance.

## 2016-10-30 LAB — URINE CULTURE

## 2016-11-22 DIAGNOSIS — E1140 Type 2 diabetes mellitus with diabetic neuropathy, unspecified: Secondary | ICD-10-CM | POA: Diagnosis not present

## 2017-01-07 DIAGNOSIS — J309 Allergic rhinitis, unspecified: Secondary | ICD-10-CM | POA: Diagnosis not present

## 2017-01-07 DIAGNOSIS — J329 Chronic sinusitis, unspecified: Secondary | ICD-10-CM | POA: Diagnosis not present

## 2017-01-07 DIAGNOSIS — Z1389 Encounter for screening for other disorder: Secondary | ICD-10-CM | POA: Diagnosis not present

## 2017-01-07 DIAGNOSIS — E119 Type 2 diabetes mellitus without complications: Secondary | ICD-10-CM | POA: Diagnosis not present

## 2017-01-07 DIAGNOSIS — G894 Chronic pain syndrome: Secondary | ICD-10-CM | POA: Diagnosis not present

## 2017-01-07 DIAGNOSIS — M1991 Primary osteoarthritis, unspecified site: Secondary | ICD-10-CM | POA: Diagnosis not present

## 2017-01-09 ENCOUNTER — Other Ambulatory Visit (HOSPITAL_COMMUNITY): Payer: Self-pay | Admitting: Internal Medicine

## 2017-01-09 DIAGNOSIS — E2839 Other primary ovarian failure: Secondary | ICD-10-CM

## 2017-01-15 DIAGNOSIS — M47896 Other spondylosis, lumbar region: Secondary | ICD-10-CM | POA: Diagnosis not present

## 2017-01-15 DIAGNOSIS — M532X7 Spinal instabilities, lumbosacral region: Secondary | ICD-10-CM | POA: Diagnosis not present

## 2017-01-15 DIAGNOSIS — M545 Low back pain: Secondary | ICD-10-CM | POA: Diagnosis not present

## 2017-01-15 DIAGNOSIS — M47816 Spondylosis without myelopathy or radiculopathy, lumbar region: Secondary | ICD-10-CM | POA: Diagnosis not present

## 2017-01-15 DIAGNOSIS — M170 Bilateral primary osteoarthritis of knee: Secondary | ICD-10-CM | POA: Diagnosis not present

## 2017-01-20 ENCOUNTER — Other Ambulatory Visit (HOSPITAL_COMMUNITY): Payer: Self-pay

## 2017-01-20 ENCOUNTER — Encounter (HOSPITAL_COMMUNITY): Payer: Self-pay

## 2017-01-30 DIAGNOSIS — Z1211 Encounter for screening for malignant neoplasm of colon: Secondary | ICD-10-CM | POA: Diagnosis not present

## 2017-02-20 ENCOUNTER — Ambulatory Visit (HOSPITAL_COMMUNITY)
Admission: RE | Admit: 2017-02-20 | Discharge: 2017-02-20 | Disposition: A | Discharge status: Home / Self Care | Payer: Medicare Other | Source: Ambulatory Visit | Attending: Internal Medicine | Admitting: Internal Medicine

## 2017-02-20 DIAGNOSIS — E2839 Other primary ovarian failure: Secondary | ICD-10-CM | POA: Diagnosis present

## 2017-03-20 ENCOUNTER — Other Ambulatory Visit (HOSPITAL_COMMUNITY): Payer: Self-pay | Admitting: Internal Medicine

## 2017-03-20 DIAGNOSIS — Z1231 Encounter for screening mammogram for malignant neoplasm of breast: Secondary | ICD-10-CM

## 2017-03-20 DIAGNOSIS — M1991 Primary osteoarthritis, unspecified site: Secondary | ICD-10-CM | POA: Diagnosis not present

## 2017-03-20 DIAGNOSIS — G4700 Insomnia, unspecified: Secondary | ICD-10-CM | POA: Diagnosis not present

## 2017-03-20 DIAGNOSIS — G894 Chronic pain syndrome: Secondary | ICD-10-CM | POA: Diagnosis not present

## 2017-04-04 ENCOUNTER — Ambulatory Visit (HOSPITAL_COMMUNITY): Payer: Self-pay

## 2017-04-10 ENCOUNTER — Ambulatory Visit (HOSPITAL_COMMUNITY): Payer: Self-pay

## 2017-04-17 ENCOUNTER — Ambulatory Visit (HOSPITAL_COMMUNITY)
Admission: RE | Admit: 2017-04-17 | Discharge: 2017-04-17 | Disposition: A | Discharge status: Home / Self Care | Payer: Medicare Other | Source: Ambulatory Visit | Attending: Internal Medicine | Admitting: Internal Medicine

## 2017-04-17 DIAGNOSIS — Z1231 Encounter for screening mammogram for malignant neoplasm of breast: Secondary | ICD-10-CM | POA: Insufficient documentation

## 2017-06-06 DIAGNOSIS — I10 Essential (primary) hypertension: Secondary | ICD-10-CM | POA: Diagnosis not present

## 2017-06-06 DIAGNOSIS — Z1389 Encounter for screening for other disorder: Secondary | ICD-10-CM | POA: Diagnosis not present

## 2017-06-06 DIAGNOSIS — E119 Type 2 diabetes mellitus without complications: Secondary | ICD-10-CM | POA: Diagnosis not present

## 2017-06-06 DIAGNOSIS — R5383 Other fatigue: Secondary | ICD-10-CM | POA: Diagnosis not present

## 2017-09-22 DIAGNOSIS — R000 Tachycardia, unspecified: Secondary | ICD-10-CM | POA: Diagnosis not present

## 2017-09-22 DIAGNOSIS — B349 Viral infection, unspecified: Secondary | ICD-10-CM | POA: Diagnosis not present

## 2017-09-22 DIAGNOSIS — R42 Dizziness and giddiness: Secondary | ICD-10-CM | POA: Diagnosis not present

## 2017-09-22 DIAGNOSIS — Z1389 Encounter for screening for other disorder: Secondary | ICD-10-CM | POA: Diagnosis not present

## 2017-10-17 DIAGNOSIS — I10 Essential (primary) hypertension: Secondary | ICD-10-CM | POA: Diagnosis not present

## 2017-10-17 DIAGNOSIS — M2550 Pain in unspecified joint: Secondary | ICD-10-CM | POA: Diagnosis not present

## 2017-10-17 DIAGNOSIS — Z23 Encounter for immunization: Secondary | ICD-10-CM | POA: Diagnosis not present

## 2017-10-17 DIAGNOSIS — G894 Chronic pain syndrome: Secondary | ICD-10-CM | POA: Diagnosis not present

## 2017-10-17 DIAGNOSIS — M1991 Primary osteoarthritis, unspecified site: Secondary | ICD-10-CM | POA: Diagnosis not present

## 2017-11-17 DIAGNOSIS — E138 Other specified diabetes mellitus with unspecified complications: Secondary | ICD-10-CM | POA: Diagnosis not present

## 2017-12-25 DIAGNOSIS — R300 Dysuria: Secondary | ICD-10-CM | POA: Diagnosis not present

## 2018-01-05 DIAGNOSIS — I10 Essential (primary) hypertension: Secondary | ICD-10-CM | POA: Diagnosis not present

## 2018-01-05 DIAGNOSIS — G894 Chronic pain syndrome: Secondary | ICD-10-CM | POA: Diagnosis not present

## 2018-01-05 DIAGNOSIS — E1140 Type 2 diabetes mellitus with diabetic neuropathy, unspecified: Secondary | ICD-10-CM | POA: Diagnosis not present

## 2018-01-19 DIAGNOSIS — G894 Chronic pain syndrome: Secondary | ICD-10-CM | POA: Diagnosis not present

## 2018-01-19 DIAGNOSIS — Z1389 Encounter for screening for other disorder: Secondary | ICD-10-CM | POA: Diagnosis not present

## 2018-01-19 DIAGNOSIS — E1142 Type 2 diabetes mellitus with diabetic polyneuropathy: Secondary | ICD-10-CM | POA: Diagnosis not present

## 2018-01-19 DIAGNOSIS — Z0001 Encounter for general adult medical examination with abnormal findings: Secondary | ICD-10-CM | POA: Diagnosis not present

## 2018-02-16 ENCOUNTER — Other Ambulatory Visit: Payer: Self-pay

## 2018-02-16 ENCOUNTER — Encounter (HOSPITAL_COMMUNITY): Payer: Self-pay | Admitting: Emergency Medicine

## 2018-02-16 ENCOUNTER — Emergency Department (HOSPITAL_COMMUNITY): Payer: Medicare Other

## 2018-02-16 ENCOUNTER — Inpatient Hospital Stay (HOSPITAL_COMMUNITY)
Admission: EM | Admit: 2018-02-16 | Discharge: 2018-02-17 | DRG: 176 | Disposition: A | Discharge status: Home / Self Care | Payer: Medicare Other | Attending: Internal Medicine | Admitting: Internal Medicine

## 2018-02-16 DIAGNOSIS — Z7982 Long term (current) use of aspirin: Secondary | ICD-10-CM

## 2018-02-16 DIAGNOSIS — Z87891 Personal history of nicotine dependence: Secondary | ICD-10-CM

## 2018-02-16 DIAGNOSIS — E1142 Type 2 diabetes mellitus with diabetic polyneuropathy: Secondary | ICD-10-CM | POA: Diagnosis present

## 2018-02-16 DIAGNOSIS — F03A0 Unspecified dementia, mild, without behavioral disturbance, psychotic disturbance, mood disturbance, and anxiety: Secondary | ICD-10-CM | POA: Diagnosis present

## 2018-02-16 DIAGNOSIS — K219 Gastro-esophageal reflux disease without esophagitis: Secondary | ICD-10-CM | POA: Diagnosis present

## 2018-02-16 DIAGNOSIS — F419 Anxiety disorder, unspecified: Secondary | ICD-10-CM | POA: Diagnosis present

## 2018-02-16 DIAGNOSIS — Z7984 Long term (current) use of oral hypoglycemic drugs: Secondary | ICD-10-CM

## 2018-02-16 DIAGNOSIS — R5383 Other fatigue: Secondary | ICD-10-CM | POA: Diagnosis not present

## 2018-02-16 DIAGNOSIS — Z880 Allergy status to penicillin: Secondary | ICD-10-CM

## 2018-02-16 DIAGNOSIS — E1143 Type 2 diabetes mellitus with diabetic autonomic (poly)neuropathy: Secondary | ICD-10-CM | POA: Diagnosis not present

## 2018-02-16 DIAGNOSIS — R0989 Other specified symptoms and signs involving the circulatory and respiratory systems: Secondary | ICD-10-CM | POA: Diagnosis not present

## 2018-02-16 DIAGNOSIS — Z6832 Body mass index (BMI) 32.0-32.9, adult: Secondary | ICD-10-CM | POA: Diagnosis not present

## 2018-02-16 DIAGNOSIS — M797 Fibromyalgia: Secondary | ICD-10-CM | POA: Diagnosis not present

## 2018-02-16 DIAGNOSIS — I2510 Atherosclerotic heart disease of native coronary artery without angina pectoris: Secondary | ICD-10-CM | POA: Diagnosis not present

## 2018-02-16 DIAGNOSIS — I2609 Other pulmonary embolism with acute cor pulmonale: Secondary | ICD-10-CM | POA: Diagnosis not present

## 2018-02-16 DIAGNOSIS — Z791 Long term (current) use of non-steroidal anti-inflammatories (NSAID): Secondary | ICD-10-CM

## 2018-02-16 DIAGNOSIS — F329 Major depressive disorder, single episode, unspecified: Secondary | ICD-10-CM | POA: Diagnosis present

## 2018-02-16 DIAGNOSIS — R531 Weakness: Secondary | ICD-10-CM | POA: Diagnosis not present

## 2018-02-16 DIAGNOSIS — Z79899 Other long term (current) drug therapy: Secondary | ICD-10-CM | POA: Diagnosis not present

## 2018-02-16 DIAGNOSIS — F0390 Unspecified dementia without behavioral disturbance: Secondary | ICD-10-CM | POA: Diagnosis present

## 2018-02-16 DIAGNOSIS — E669 Obesity, unspecified: Secondary | ICD-10-CM | POA: Diagnosis present

## 2018-02-16 DIAGNOSIS — E785 Hyperlipidemia, unspecified: Secondary | ICD-10-CM | POA: Diagnosis present

## 2018-02-16 DIAGNOSIS — I10 Essential (primary) hypertension: Secondary | ICD-10-CM | POA: Diagnosis present

## 2018-02-16 DIAGNOSIS — E119 Type 2 diabetes mellitus without complications: Secondary | ICD-10-CM

## 2018-02-16 DIAGNOSIS — I2699 Other pulmonary embolism without acute cor pulmonale: Principal | ICD-10-CM | POA: Diagnosis present

## 2018-02-16 HISTORY — DX: Type 2 diabetes mellitus with diabetic polyneuropathy: E11.42

## 2018-02-16 LAB — APTT: aPTT: 26 seconds (ref 24–36)

## 2018-02-16 LAB — COMPREHENSIVE METABOLIC PANEL
ALT: 17 U/L (ref 14–54)
AST: 25 U/L (ref 15–41)
Albumin: 3.4 g/dL — ABNORMAL LOW (ref 3.5–5.0)
Alkaline Phosphatase: 72 U/L (ref 38–126)
Anion gap: 9 (ref 5–15)
BUN: 10 mg/dL (ref 6–20)
CO2: 29 mmol/L (ref 22–32)
CREATININE: 1.12 mg/dL — AB (ref 0.44–1.00)
Calcium: 8.8 mg/dL — ABNORMAL LOW (ref 8.9–10.3)
Chloride: 102 mmol/L (ref 101–111)
GFR, EST AFRICAN AMERICAN: 52 mL/min — AB (ref >60)
GFR, EST NON AFRICAN AMERICAN: 45 mL/min — AB (ref >60)
Glucose, Bld: 131 mg/dL — ABNORMAL HIGH (ref 65–99)
POTASSIUM: 4.2 mmol/L (ref 3.5–5.1)
Sodium: 140 mmol/L (ref 135–145)
Total Bilirubin: 0.4 mg/dL (ref 0.3–1.2)
Total Protein: 7 g/dL (ref 6.5–8.1)

## 2018-02-16 LAB — URINALYSIS, ROUTINE W REFLEX MICROSCOPIC
Bilirubin Urine: NEGATIVE
Glucose, UA: NEGATIVE mg/dL
HGB URINE DIPSTICK: NEGATIVE
Ketones, ur: NEGATIVE mg/dL
Leukocytes, UA: NEGATIVE
Nitrite: NEGATIVE
PROTEIN: NEGATIVE mg/dL
Specific Gravity, Urine: 1.008 (ref 1.005–1.030)
pH: 6 (ref 5.0–8.0)

## 2018-02-16 LAB — TROPONIN I
Troponin I: <0.03 ng/mL (ref <0.03)
Troponin I: <0.03 ng/mL (ref <0.03)

## 2018-02-16 LAB — PROTIME-INR
INR: 0.97
PROTHROMBIN TIME: 12.8 s (ref 11.4–15.2)

## 2018-02-16 LAB — CBC
HEMATOCRIT: 37.1 % (ref 36.0–46.0)
HEMOGLOBIN: 11.8 g/dL — AB (ref 12.0–15.0)
MCH: 30 pg (ref 26.0–34.0)
MCHC: 31.8 g/dL (ref 30.0–36.0)
MCV: 94.4 fL (ref 78.0–100.0)
Platelets: 223 10*3/uL (ref 150–400)
RBC: 3.93 MIL/uL (ref 3.87–5.11)
RDW: 13.1 % (ref 11.5–15.5)
WBC: 9.2 10*3/uL (ref 4.0–10.5)

## 2018-02-16 LAB — BRAIN NATRIURETIC PEPTIDE: B Natriuretic Peptide: 40 pg/mL (ref 0.0–100.0)

## 2018-02-16 LAB — PHOSPHORUS: Phosphorus: 3.6 mg/dL (ref 2.5–4.6)

## 2018-02-16 LAB — MAGNESIUM: MAGNESIUM: 1.9 mg/dL (ref 1.7–2.4)

## 2018-02-16 LAB — LIPASE, BLOOD: LIPASE: 27 U/L (ref 11–51)

## 2018-02-16 LAB — D-DIMER, QUANTITATIVE: D-Dimer, Quant: 3.5 ug/mL-FEU — ABNORMAL HIGH (ref 0.00–0.50)

## 2018-02-16 MED ORDER — SODIUM CHLORIDE 0.9 % IV BOLUS
500.0000 mL | Freq: Once | INTRAVENOUS | Status: AC
  Administered 2018-02-16: 500 mL via INTRAVENOUS

## 2018-02-16 MED ORDER — OXYCODONE HCL 5 MG PO TABS
5.0000 mg | ORAL_TABLET | ORAL | Status: DC | PRN
  Administered 2018-02-17: 5 mg via ORAL
  Filled 2018-02-16: qty 1

## 2018-02-16 MED ORDER — SODIUM CHLORIDE 0.45 % IV SOLN
INTRAVENOUS | Status: AC
  Administered 2018-02-16 – 2018-02-17 (×2): via INTRAVENOUS

## 2018-02-16 MED ORDER — PANTOPRAZOLE SODIUM 40 MG PO TBEC
40.0000 mg | DELAYED_RELEASE_TABLET | Freq: Every day | ORAL | Status: DC
  Administered 2018-02-17: 40 mg via ORAL
  Filled 2018-02-16: qty 1

## 2018-02-16 MED ORDER — MEMANTINE HCL 10 MG PO TABS
5.0000 mg | ORAL_TABLET | Freq: Two times a day (BID) | ORAL | Status: DC
  Administered 2018-02-16 – 2018-02-17 (×2): 5 mg via ORAL
  Filled 2018-02-16 (×2): qty 1

## 2018-02-16 MED ORDER — ONDANSETRON HCL 4 MG PO TABS: 4.0000 mg | ORAL_TABLET | Freq: Four times a day (QID) | ORAL | Status: DC | PRN

## 2018-02-16 MED ORDER — AMLODIPINE BESYLATE 5 MG PO TABS
10.0000 mg | ORAL_TABLET | Freq: Every day | ORAL | Status: DC
  Administered 2018-02-17: 10 mg via ORAL
  Filled 2018-02-16: qty 2

## 2018-02-16 MED ORDER — METFORMIN HCL 500 MG PO TABS
500.0000 mg | ORAL_TABLET | Freq: Two times a day (BID) | ORAL | Status: DC
  Administered 2018-02-17 (×2): 500 mg via ORAL
  Filled 2018-02-16 (×2): qty 1

## 2018-02-16 MED ORDER — VITAMIN D 1000 UNITS PO TABS
1000.0000 [IU] | ORAL_TABLET | Freq: Two times a day (BID) | ORAL | Status: DC
  Administered 2018-02-16 – 2018-02-17 (×2): 1000 [IU] via ORAL
  Filled 2018-02-16 (×2): qty 1

## 2018-02-16 MED ORDER — ACETAMINOPHEN 650 MG RE SUPP: 650.0000 mg | Freq: Four times a day (QID) | RECTAL | Status: DC | PRN

## 2018-02-16 MED ORDER — ONDANSETRON HCL 4 MG/2ML IJ SOLN: 4.0000 mg | Freq: Four times a day (QID) | INTRAMUSCULAR | Status: DC | PRN

## 2018-02-16 MED ORDER — POTASSIUM CHLORIDE CRYS ER 20 MEQ PO TBCR
20.0000 meq | EXTENDED_RELEASE_TABLET | Freq: Every morning | ORAL | Status: DC
  Administered 2018-02-17: 20 meq via ORAL
  Filled 2018-02-16: qty 1

## 2018-02-16 MED ORDER — LOSARTAN POTASSIUM 50 MG PO TABS
25.0000 mg | ORAL_TABLET | Freq: Every day | ORAL | Status: DC
  Administered 2018-02-16 – 2018-02-17 (×2): 25 mg via ORAL
  Filled 2018-02-16 (×2): qty 1

## 2018-02-16 MED ORDER — HEPARIN (PORCINE) IN NACL 100-0.45 UNIT/ML-% IJ SOLN
1150.0000 [IU]/h | INTRAMUSCULAR | Status: DC
  Administered 2018-02-16: 1150 [IU]/h via INTRAVENOUS
  Filled 2018-02-16: qty 250

## 2018-02-16 MED ORDER — SIMVASTATIN 20 MG PO TABS
40.0000 mg | ORAL_TABLET | Freq: Every day | ORAL | Status: DC
  Administered 2018-02-16: 40 mg via ORAL
  Filled 2018-02-16: qty 2

## 2018-02-16 MED ORDER — OCUVITE-LUTEIN PO CAPS
1.0000 | ORAL_CAPSULE | Freq: Every morning | ORAL | Status: DC
  Administered 2018-02-17: 1 via ORAL
  Filled 2018-02-16: qty 1

## 2018-02-16 MED ORDER — ALPRAZOLAM 1 MG PO TABS
1.0000 mg | ORAL_TABLET | Freq: Four times a day (QID) | ORAL | Status: DC | PRN
  Administered 2018-02-16: 1 mg via ORAL
  Filled 2018-02-16: qty 1

## 2018-02-16 MED ORDER — ACETAMINOPHEN 325 MG PO TABS: 650.0000 mg | ORAL_TABLET | Freq: Four times a day (QID) | ORAL | Status: DC | PRN

## 2018-02-16 MED ORDER — GABAPENTIN 400 MG PO CAPS
400.0000 mg | ORAL_CAPSULE | Freq: Four times a day (QID) | ORAL | Status: DC
  Administered 2018-02-16 – 2018-02-17 (×4): 400 mg via ORAL
  Filled 2018-02-16 (×4): qty 1

## 2018-02-16 MED ORDER — MIRTAZAPINE 30 MG PO TABS
30.0000 mg | ORAL_TABLET | Freq: Every day | ORAL | Status: DC
  Administered 2018-02-16: 30 mg via ORAL
  Filled 2018-02-16: qty 1

## 2018-02-16 MED ORDER — HEPARIN BOLUS VIA INFUSION
4000.0000 [IU] | Freq: Once | INTRAVENOUS | Status: AC
  Administered 2018-02-16: 4000 [IU] via INTRAVENOUS

## 2018-02-16 MED ORDER — IOPAMIDOL (ISOVUE-370) INJECTION 76%
65.0000 mL | Freq: Once | INTRAVENOUS | Status: AC | PRN
  Administered 2018-02-16: 65 mL via INTRAVENOUS

## 2018-02-16 MED ORDER — VITAMIN C 500 MG PO TABS
500.0000 mg | ORAL_TABLET | Freq: Every morning | ORAL | Status: DC
  Administered 2018-02-17: 500 mg via ORAL
  Filled 2018-02-16: qty 1

## 2018-02-16 NOTE — ED Provider Notes (Addendum)
[facility] EMERGENCY DEPARTMENT Provider Note   CSN: [id number] Arrival date & time: 02/16/18  1329     History   Chief Complaint Chief Complaint  Patient presents with  . Weakness    HPI Laurie Mcfarland is a 83 y.o. female.  HPI Patient presents to the emergency room for evaluation of generalized weakness and malaise.  Family states that since last Thursday the patient not been acting like herself.  She has been getting out of bed later than usual.  He has not been eating well.  She has been complaining of some generalized aches.  She is also been somewhat confused forgetting that family members and caregivers have been to the house.  They thought she felt a little warm and thought she might be developing a fever.  They called the doctor to see about getting her evaluated today and her doctor's office recommended she come to the emergency room.  She has not been coughing.  No vomiting or diarrhea.  No headache.  No focal numbness or weakness.  No difficulty with her speech. Past Medical History:  Diagnosis Date  . Anxiety   . Arthritis   . Complication of anesthesia    pt. states that she remembers parts of her surgery when she had her TAH  . Diabetes mellitus   . Fibromyalgia   . GERD (gastroesophageal reflux disease)   . Hyperlipidemia   . Hypertension   . Shortness of breath    with exertion    Patient Active Problem List   Diagnosis Date Noted  . Syncope 06/29/2016  . Renal insufficiency 06/29/2016  . Anemia 06/29/2016  . Edema   . Mild dementia 03/01/2015  . Gait instability 03/01/2015  . Frequent falls 03/01/2015  . Memory loss 09/03/2014  . Essential hypertension 09/03/2014  . Hyperlipidemia 09/03/2014  . Diabetes type 2, controlled ([place]) 09/03/2014  . Confusion 07/13/2014  . UTI (lower urinary tract infection) 07/13/2014  . Acute encephalopathy 07/13/2014  . Fall at home 07/13/2014  . DM type 2 (diabetes mellitus, type 2) ([place]) 07/13/2014  . Esophageal  dysphagia 06/09/2013  . GERD (gastroesophageal reflux disease) 06/09/2013  . Early satiety 06/09/2013    Past Surgical History:  Procedure Laterality Date  . ABDOMINAL HYSTERECTOMY    . CATARACT EXTRACTION W/PHACO  12/10/2011   Procedure: CATARACT EXTRACTION PHACO AND INTRAOCULAR LENS PLACEMENT (IOC);  Surgeon: Elta Guadeloupe T. Gershon Crane, MD;  Location: [facility];  Service: Ophthalmology;  Laterality: Left;  CDE=10.72  . CATARACT EXTRACTION W/PHACO  01/14/2012   Procedure: CATARACT EXTRACTION PHACO AND INTRAOCULAR LENS PLACEMENT (IOC);  Surgeon: Elta Guadeloupe T. Gershon Crane, MD;  Location: [facility];  Service: Ophthalmology;  Laterality: Right;  CDE:11.63  . ESOPHAGOGASTRODUODENOSCOPY (EGD) WITH ESOPHAGEAL DILATION N/A 06/17/2013   [facility]:ENIDPO esophagus-status post Venia Minks dilation. Hiatal hernia. Gastric erosions s/p bx  . foreign body removal     splinter removed from right side of face     OB History   None      Home Medications    Prior to Admission medications   Medication Sig Start Date End Date Taking? Authorizing Provider  ALPRAZolam Duanne Moron) 1 MG tablet Take 1 mg by mouth 4 (four) times daily as needed for anxiety. Nerves    Yes [provider]  amLODipine (NORVASC) 10 MG tablet Take 10 mg by mouth daily.  05/19/13  Yes [provider]  aspirin EC 81 MG tablet Take 81 mg by mouth every morning.    Yes [provider]  Cholecalciferol (VITAMIN D-3) 1000 UNITS CAPS Take 1 capsule by mouth 2 (two) times daily.    Yes [provider]  CRANBERRY FRUIT PO Take 1 capsule by mouth daily.   Yes [provider]  gabapentin (NEURONTIN) 400 MG capsule Take 400 mg by mouth 4 (four) times daily.  01/19/18  Yes [provider]  losartan (COZAAR) 25 MG tablet Take 25 mg by mouth daily.   Yes [provider]  meloxicam (MOBIC) 7.5 MG tablet Take 7.5 mg by mouth 2 (two) times daily. Take 1 tablet bid 02/06/15  Yes [provider]  memantine (NAMENDA) 5 MG  tablet Take 5 mg by mouth 2 (two) times daily.  01/28/18  Yes [provider]  metFORMIN (GLUCOPHAGE) 500 MG tablet Take 500 mg by mouth 2 (two) times daily with a meal.    Yes [provider]  mirtazapine (REMERON) 30 MG tablet Take 30 mg by mouth at bedtime.  01/14/18  Yes [provider]  Multiple Vitamin (MULTIVITAMIN WITH MINERALS) TABS tablet Take 1 tablet by mouth daily.   Yes [provider]  multivitamin-lutein (OCUVITE-LUTEIN) CAPS Take 1 capsule by mouth every morning.    Yes [provider]  omeprazole (PRILOSEC) 40 MG capsule Take 40 mg by mouth daily.   Yes [provider]  oxyCODONE (OXY IR/ROXICODONE) 5 MG immediate release tablet Take 1 tablet by mouth every 4 (four) hours as needed. For pain 06/15/16  Yes [provider]  potassium chloride SA (K-DUR,KLOR-CON) 20 MEQ tablet Take 20 mEq by mouth every morning.    Yes [provider]  simvastatin (ZOCOR) 40 MG tablet Take 40 mg by mouth daily. 06/06/16  Yes [provider]  vitamin C (ASCORBIC ACID) 500 MG tablet Take 500 mg by mouth every morning.    Yes [provider]    Family History Family History  Problem Relation Age of Onset  . Alzheimer's disease Mother   . Cancer Father   . Cancer Sister   . Cancer Brother   . Pseudochol deficiency Neg Hx   . Malignant hyperthermia Neg Hx   . Hypotension Neg Hx   . Anesthesia problems Neg Hx   . Colon cancer Neg Hx     Social History Social History   Tobacco Use  . Smoking status: Former Smoker    Last attempt to quit: 08/30/1994    Years since quitting: 23.4  . Smokeless tobacco: Never Used  Substance Use Topics  . Alcohol use: No    Alcohol/week: 0.0 oz  . Drug use: No     Allergies   Penicillins   Review of Systems Review of Systems  All other systems reviewed and are negative.    Physical Exam Updated Vital Signs BP (!) 120/58   Pulse 74   Temp 98.4 F (36.9 C)  (Oral)   Resp 14   Ht 1.6 m (5\' 3" )   Wt 81.6 kg (180 lb)   SpO2 98%   BMI 31.89 kg/m   Physical Exam  Constitutional: No distress.  Oxygen saturation noted to be 90% initially on room air  HENT:  Head: Normocephalic and atraumatic.  Right Ear: External ear normal.  Left Ear: External ear normal.  Eyes: Conjunctivae are normal. Right eye exhibits no discharge. Left eye exhibits no discharge. No scleral icterus.  Neck: Neck supple. No tracheal deviation present.  Cardiovascular: Normal rate, regular rhythm and intact distal pulses.  Pulmonary/Chest: Effort normal and breath sounds normal.  No stridor. No respiratory distress. She has no wheezes. She has no rales.  Abdominal: Soft. Bowel sounds are normal. She exhibits no distension. There is no tenderness. There is no rebound and no guarding.  Musculoskeletal: She exhibits no edema or tenderness.  Neurological: She is alert. She has normal strength. No cranial nerve deficit (no facial droop, extraocular movements intact, no slurred speech) or sensory deficit. She exhibits normal muscle tone. She displays no seizure activity. Coordination normal.  Skin: Skin is warm and dry. No rash noted. She is not diaphoretic.  Psychiatric: She has a normal mood and affect.  Nursing note and vitals reviewed.    ED Treatments / Results  Labs (all labs ordered are listed, but only abnormal results are displayed) Labs Reviewed  CBC - Abnormal; Notable for the following components:      Result Value   Hemoglobin 11.8 (*)    All other components within normal limits  COMPREHENSIVE METABOLIC PANEL - Abnormal; Notable for the following components:   Glucose, Bld 131 (*)    Creatinine, Ser 1.12 (*)    Calcium 8.8 (*)    Albumin 3.4 (*)    GFR calc non Af Amer 45 (*)    GFR calc Af Amer 52 (*)    All other components within normal limits  D-DIMER, QUANTITATIVE (NOT AT [facility]) - Abnormal; Notable for the following components:   D-Dimer, Quant 3.50  (*)    All other components within normal limits  TROPONIN I  LIPASE, BLOOD  URINALYSIS, ROUTINE W REFLEX MICROSCOPIC  BRAIN NATRIURETIC PEPTIDE    EKG EKG Interpretation  Date/Time:  Monday February 16 2018 19:17:02 EDT Ventricular Rate:  83 PR Interval:    QRS Duration: 94 QT Interval:  368 QTC Calculation: 433 R Axis:   53 Text Interpretation:  Sinus rhythm Low voltage, precordial leads No significant change since last tracing Confirmed by Dorie Rank [phone]) on 02/16/2018 7:27:13 PM   Radiology Dg Chest 2 View  Result Date: 02/16/2018 CLINICAL DATA:  Weakness for 4 days. History of hypertension. Former smoker. EXAM: CHEST - 2 VIEW COMPARISON:  10/29/2016 and 06/28/2016 radiographs. 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 The left lung is clear. There is no pleural effusion or pneumothorax. The heart size and mediastinal contours are stable with aortic atherosclerosis. There are degenerative changes within the lumbar spine. No acute osseous findings. IMPRESSION: Stable chest with chronic right hemidiaphragm eventration and right basilar atelectasis/scarring. No acute cardiopulmonary process. Electronically Signed   By: Richardean Sale M.D.   On: 02/16/2018 15:12   Ct Head Wo Contrast  Result Date: 02/16/2018 CLINICAL DATA:  83 year old female with generalized weakness for 4 days. Initial encounter. EXAM: CT HEAD WITHOUT CONTRAST TECHNIQUE: Contiguous axial images were obtained from the base of the skull through the vertex without intravenous contrast. COMPARISON:  10/29/2016 CT. FINDINGS: Brain: No intracranial hemorrhage or CT evidence of large acute infarct. Mild chronic microvascular changes. Mild global atrophy. No intracranial mass lesion noted on this unenhanced exam. Vascular: Vascular calcifications Skull: Negative Sinuses/Orbits: No acute orbital abnormality. Visualized paranasal sinuses are clear.  Other: Mastoid air cells and middle ear cavities are clear. IMPRESSION: No acute intracranial abnormality. Mild chronic microvascular changes. Mild atrophy. Electronically Signed   By: Genia Del M.D.   On: 02/16/2018 14:51   Ct Angio Chest Pe W And/or Wo Contrast  Result Date: 02/16/2018 CLINICAL DATA:  Malaise for several days EXAM: CT ANGIOGRAPHY  CHEST WITH CONTRAST TECHNIQUE: Multidetector CT imaging of the chest was performed using the standard protocol during bolus administration of intravenous contrast. Multiplanar CT image reconstructions and MIPs were obtained to evaluate the vascular anatomy. CONTRAST:  65mL ISOVUE-370 IOPAMIDOL (ISOVUE-370) INJECTION 76% COMPARISON:  Chest x-ray from earlier in the same day. FINDINGS: Cardiovascular: Thoracic aorta demonstrates atherosclerotic calcifications without aneurysmal dilatation or dissection. Coronary calcifications are seen. No cardiac enlargement is noted. The pulmonary artery shows a normal branching pattern. Scattered filling defects are noted within the left lower lobe branches consistent with acute pulmonary emboli. A few upper lobe emboli on the right are noted as well. Mediastinum/Nodes: The esophagus is within normal limits. The thoracic inlet is unremarkable. No significant hilar or mediastinal adenopathy is noted. Lungs/Pleura: The lungs are well aerated bilaterally. No focal infiltrate or sizable effusion is noted. Mild atelectatic changes in the right middle and right lower lobes are seen. Parenchymal nodules are noted. Upper Abdomen: A few hypodensities within the liver are noted likely representing cysts. No other upper abdominal abnormality is seen. Musculoskeletal: Degenerative changes of the thoracic spine are noted. No other focal abnormality is seen. Review of the MIP images confirms the above findings. IMPRESSION: Scattered bilateral pulmonary emboli as described above. Mild atelectatic changes as described. Electronically Signed    By: Inez Catalina M.D.   On: 02/16/2018 19:09    Procedures .Critical Care Performed by: Dorie Rank, MD Authorized by: Dorie Rank, MD   Critical care provider statement:    Critical care time (minutes):  35   Critical care was time spent personally by me on the following activities:  Discussions with consultants, evaluation of patient's response to treatment, examination of patient, ordering and performing treatments and interventions, ordering and review of laboratory studies, ordering and review of radiographic studies, pulse oximetry, re-evaluation of patient's condition, obtaining history from patient or surrogate and review of old charts   (including critical care time)  Medications Ordered in ED Medications  sodium chloride 0.9 % bolus 500 mL (0 mLs Intravenous Stopped 02/16/18 1655)  iopamidol (ISOVUE-370) 76 % injection 65 mL (65 mLs Intravenous Contrast Given 02/16/18 1839)     Initial Impression / Assessment and Plan / ED Course  I have reviewed the triage vital signs and the nursing notes.  Pertinent labs & imaging results that were available during my care of the patient were reviewed by me and considered in my medical decision making (see chart for details).   Patient presented to the emergency room for evaluation of generalized malaise and weakness.  Patient initially did not mention any difficulty with her breathing.  There was some complaints of confusion so CT scan of the head was performed.  Laboratory tests and initial x-ray studies are unremarkable.  During her ED work-up we noted the patient's oxygen saturation was low without supplemental oxygen.  D-dimer was added on.  This was positive.  Subsequent CT Angio of the chest shows bilateral pulmonary emboli.  Patient was started on IV heparin.  She remains hemodynamically stable.  I will consult the medical service for admission  Final Clinical Impressions(s) / ED Diagnoses   Final diagnoses:  Other acute pulmonary  embolism with acute cor pulmonale ([place])        Dorie Rank, MD 02/16/18 1945

## 2018-02-16 NOTE — ED Notes (Signed)
Pt taken to CT.

## 2018-02-16 NOTE — ED Triage Notes (Signed)
Pt states generlized malaises since last Thursday. Denies cough/congestion. Pt had fever today per family. No fever on arrival. Denies SOB/chest pain

## 2018-02-16 NOTE — ED Notes (Signed)
Pt taken to ct 

## 2018-02-16 NOTE — Progress Notes (Signed)
ANTICOAGULATION CONSULT NOTE - Initial Consult  Pharmacy Consult for Heparin Indication: pulmonary embolus  Allergies  Allergen Reactions  . Penicillins Rash    Has patient had a PCN reaction causing immediate rash, facial/tongue/throat swelling, SOB or lightheadedness with hypotension: No Has patient had a PCN reaction causing severe rash involving mucus membranes or skin necrosis: No Has patient had a PCN reaction that required hospitalization: No Has patient had a PCN reaction occurring within the last 10 years: No If all of the above answers are "NO", then may proceed with Cephalosporin use.    Patient Measurements: Height: 5\' 3"  (160 cm) Weight: 180 lb (81.6 kg) IBW/kg (Calculated) : 52.4 HEPARIN DW (KG): 70.3   Vital Signs: Temp: 98 F (36.7 C) (06/17 1911) Temp Source: Oral (06/17 1346) BP: 132/72 (06/17 1911) Pulse Rate: 80 (06/17 1911)  Labs: Recent Labs    02/16/18 1417 02/16/18 1424  HGB 11.8*  --   HCT 37.1  --   PLT 223  --   LABPROT  --  12.8  INR  --  0.97  CREATININE 1.12*  --   TROPONINI <0.03  --    Estimated Creatinine Clearance: 39.9 mL/min (A) (by C-G formula based on SCr of 1.12 mg/dL (H)).  Medical History: Past Medical History:  Diagnosis Date  . Anxiety   . Arthritis   . Complication of anesthesia    pt. states that she remembers parts of her surgery when she had her TAH  . Diabetes mellitus   . Diabetic peripheral neuropathy ([place])   . Fibromyalgia   . GERD (gastroesophageal reflux disease)   . Hyperlipidemia   . Hypertension   . Shortness of breath    with exertion   Medications:   (Not in a hospital admission) Reviewed, not on Anticoagulant PTA.  Assessment: Okay for Protocol, baseline anticoagulant labs WNL.  New PE.  Goal of Therapy:  Heparin level 0.3-0.7 units/ml Monitor platelets by anticoagulation protocol: Yes   Plan:  Give 4000 units bolus x 1 Start heparin infusion at 1150 units/hr Check anti-Xa level in 6-8  hours and daily while on heparin Continue to monitor H&H and platelets  Pricilla Larsson 02/16/2018,8:05 PM

## 2018-02-16 NOTE — H&P (Signed)
History and Physical    Laurie Mcfarland:[id number] DOB: [birth year] DOA: 02/16/2018  PCP: [name], MD   Patient coming from: Home.  I have personally briefly reviewed patient's old medical records in [place]  Chief Complaint: Fatigue.  HPI: Laurie Mcfarland is a 83 y.o. female with medical history significant of, osteoarthritis, type 2 diabetes, diabetic peripheral neuropathy, fibromyalgia, GERD, hyperlipidemia, hypertension, dyspnea on exertion who is coming to the ED due to generalized fatigue/malaise since Thursday.  Her family also reported that she has been more confused than usual this past few days.  Family members felt that she was warm to touch, thought she may have a fever and called her PCP who recommended to bring her to the emergency department.  She recently had her nighttime medication and is unable to provide further history.  ED Course: Initial vital signs temperature 98.4 F, pulse 84, respirations 18, blood pressure 125/66 mmHg and O2 sat 90% on room air.  Her work-up shows a normal urinalysis.  CBC with a white count of 9.2, hemoglobin 11.8 g/dL and platelets 223.  PT/INR/PTT were within normal limits.  Her d-dimer was 3.5 mcg/mL-FEU. BNP of 40.0 pg/mL and troponin less than 0.03 ng/mL.  Lipase was 27 units/L.  Her CMP shows normal electrolytes, normal LFTs, except for albumin of 3.4 g/dL.  BUN was 10, creatinine 1.12 and glucose 131 mg/dL.  Imaging: CT head did not show any acute intracranial abnormality.  Chest radiograph showed stable chest with chronic right hemidiaphragm administration and right basilar atelectasis, but no acute cardiopulmonary process.  CTA chest shows scattered bilateral pulmonary emboli and mild atelectatic changes of the right middle and lower lobes.  Please see images and full radiology report for further detail.  Review of Systems: Unable to obtain.  Past Medical History:  Diagnosis Date  . Anxiety   . Arthritis   . Complication  of anesthesia    pt. states that she remembers parts of her surgery when she had her TAH  . Diabetes mellitus   . Diabetic peripheral neuropathy ([place])   . Fibromyalgia   . GERD (gastroesophageal reflux disease)   . Hyperlipidemia   . Hypertension   . Shortness of breath    with exertion    Past Surgical History:  Procedure Laterality Date  . ABDOMINAL HYSTERECTOMY    . CATARACT EXTRACTION W/PHACO  12/10/2011   Procedure: CATARACT EXTRACTION PHACO AND INTRAOCULAR LENS PLACEMENT (IOC);  Surgeon: Elta Guadeloupe T. Gershon Crane, MD;  Location: [facility];  Service: Ophthalmology;  Laterality: Left;  CDE=10.72  . CATARACT EXTRACTION W/PHACO  01/14/2012   Procedure: CATARACT EXTRACTION PHACO AND INTRAOCULAR LENS PLACEMENT (IOC);  Surgeon: Elta Guadeloupe T. Gershon Crane, MD;  Location: [facility];  Service: Ophthalmology;  Laterality: Right;  CDE:11.63  . ESOPHAGOGASTRODUODENOSCOPY (EGD) WITH ESOPHAGEAL DILATION N/A 06/17/2013   FXT:[id number] esophagus-status post Venia Minks dilation. Hiatal hernia. Gastric erosions s/p bx  . foreign body removal     splinter removed from right side of face     reports that she quit smoking about 23 years ago. She has never used smokeless tobacco. She reports that she does not drink alcohol or use drugs.  Allergies  Allergen Reactions  . Penicillins Rash    Has patient had a PCN reaction causing immediate rash, facial/tongue/throat swelling, SOB or lightheadedness with hypotension: No Has patient had a PCN reaction causing severe rash involving mucus membranes or skin necrosis: No Has patient had a PCN reaction that required hospitalization: No Has patient  had a PCN reaction occurring within the last 10 years: No If all of the above answers are "NO", then may proceed with Cephalosporin use.     Family History  Problem Relation Age of Onset  . Alzheimer's disease Mother   . Cancer Father   . Cancer Sister   . Cancer Brother   . Pseudochol deficiency Neg Hx   . Malignant hyperthermia Neg Hx     . Hypotension Neg Hx   . Anesthesia problems Neg Hx   . Colon cancer Neg Hx    Prior to Admission medications   Medication Sig Start Date End Date Taking? Authorizing Provider  ALPRAZolam Duanne Moron) 1 MG tablet Take 1 mg by mouth 4 (four) times daily as needed for anxiety. Nerves    Yes [provider]  amLODipine (NORVASC) 10 MG tablet Take 10 mg by mouth daily.  05/19/13  Yes [provider]  aspirin EC 81 MG tablet Take 81 mg by mouth every morning.    Yes [provider]  Cholecalciferol (VITAMIN D-3) 1000 UNITS CAPS Take 1 capsule by mouth 2 (two) times daily.    Yes [provider]  CRANBERRY FRUIT PO Take 1 capsule by mouth daily.   Yes [provider]  gabapentin (NEURONTIN) 400 MG capsule Take 400 mg by mouth 4 (four) times daily.  01/19/18  Yes [provider]  losartan (COZAAR) 25 MG tablet Take 25 mg by mouth daily.   Yes [provider]  meloxicam (MOBIC) 7.5 MG tablet Take 7.5 mg by mouth 2 (two) times daily. Take 1 tablet bid 02/06/15  Yes [provider]  memantine (NAMENDA) 5 MG tablet Take 5 mg by mouth 2 (two) times daily.  01/28/18  Yes [provider]  metFORMIN (GLUCOPHAGE) 500 MG tablet Take 500 mg by mouth 2 (two) times daily with a meal.    Yes [provider]  mirtazapine (REMERON) 30 MG tablet Take 30 mg by mouth at bedtime.  01/14/18  Yes [provider]  Multiple Vitamin (MULTIVITAMIN WITH MINERALS) TABS tablet Take 1 tablet by mouth daily.   Yes [provider]  multivitamin-lutein (OCUVITE-LUTEIN) CAPS Take 1 capsule by mouth every morning.    Yes [provider]  omeprazole (PRILOSEC) 40 MG capsule Take 40 mg by mouth daily.   Yes [provider]  oxyCODONE (OXY IR/ROXICODONE) 5 MG immediate release tablet Take 1 tablet by mouth every 4 (four) hours as needed. For pain 06/15/16  Yes [provider]  potassium chloride SA (K-DUR,KLOR-CON)  20 MEQ tablet Take 20 mEq by mouth every morning.    Yes [provider]  simvastatin (ZOCOR) 40 MG tablet Take 40 mg by mouth daily. 06/06/16  Yes [provider]  vitamin C (ASCORBIC ACID) 500 MG tablet Take 500 mg by mouth every morning.    Yes [provider]    Physical Exam: Vitals:   02/16/18 1911 02/16/18 2000 02/16/18 2057 02/16/18 2149  BP: 132/72 131/62 (!) 154/80   Pulse: 80 79 87   Resp: 19 15    Temp: 98 F (36.7 C)  97.9 F (36.6 C)   TempSrc:   Oral   SpO2: 98% 96% 95% 92%  Weight:   82.3 kg (181 lb 7 oz)   Height:        Constitutional: NAD, calm, comfortable Eyes: PERRL, lids and conjunctivae normal ENMT: Mucous membranes are moist. Posterior pharynx clear of any exudate or lesions. Neck: normal, supple,  no masses, no thyromegaly Respiratory: clear to auscultation bilaterally, no wheezing, no crackles. Normal respiratory effort. No accessory muscle use.  Cardiovascular: Regular rate and rhythm, no murmurs / rubs / gallops. No extremity edema. 2+ pedal pulses. No carotid bruits.  Abdomen: Obese, soft, no tenderness, no masses palpated. No hepatosplenomegaly. Bowel sounds positive.  Musculoskeletal: no clubbing / cyanosis. Good ROM, no contractures. Normal muscle tone.  Skin: no rashes, lesions, ulcers on very limited dermatological examination. Neurologic: Moves all extremities.  Grossly nonfocal Psychiatric: Somnolent.  Wakes up briefly.  Denied any other specific complaints at that time.  Midtarsal opinion alprazolam given earlier.   Labs on Admission: I have personally reviewed following labs and imaging studies  CBC: Recent Labs  Lab 02/16/18 1417  WBC 9.2  HGB 11.8*  HCT 37.1  MCV 94.4  PLT 960   Basic Metabolic Panel: Recent Labs  Lab 02/16/18 1417 02/16/18 1424  NA 140  --   K 4.2  --   CL 102  --   CO2 29  --   GLUCOSE 131*  --   BUN 10  --   CREATININE 1.12*  --   CALCIUM 8.8*  --   MG  --  1.9  PHOS  --   3.6   GFR: Estimated Creatinine Clearance: 40.1 mL/min (A) (by C-G formula based on SCr of 1.12 mg/dL (H)). Liver Function Tests: Recent Labs  Lab 02/16/18 1417  AST 25  ALT 17  ALKPHOS 72  BILITOT 0.4  PROT 7.0  ALBUMIN 3.4*   Recent Labs  Lab 02/16/18 1417  LIPASE 27   No results for input(s): AMMONIA in the last 168 hours. Coagulation Profile: Recent Labs  Lab 02/16/18 1424  INR 0.97   Cardiac Enzymes: Recent Labs  Lab 02/16/18 1417 02/16/18 2024  TROPONINI <0.03 <0.03   BNP (last 3 results) No results for input(s): PROBNP in the last 8760 hours. HbA1C: No results for input(s): HGBA1C in the last 72 hours. CBG: No results for input(s): GLUCAP in the last 168 hours. Lipid Profile: No results for input(s): CHOL, HDL, LDLCALC, TRIG, CHOLHDL, LDLDIRECT in the last 72 hours. Thyroid Function Tests: No results for input(s): TSH, T4TOTAL, FREET4, T3FREE, THYROIDAB in the last 72 hours. Anemia Panel: No results for input(s): VITAMINB12, FOLATE, FERRITIN, TIBC, IRON, RETICCTPCT in the last 72 hours. Urine analysis:    Component Value Date/Time   COLORURINE YELLOW 02/16/2018 1404   APPEARANCEUR CLEAR 02/16/2018 1404   LABSPEC 1.008 02/16/2018 1404   PHURINE 6.0 02/16/2018 1404   GLUCOSEU NEGATIVE 02/16/2018 1404   HGBUR NEGATIVE 02/16/2018 1404   BILIRUBINUR NEGATIVE 02/16/2018 1404   KETONESUR NEGATIVE 02/16/2018 1404   PROTEINUR NEGATIVE 02/16/2018 1404   UROBILINOGEN 0.2 07/13/2014 1021   NITRITE NEGATIVE 02/16/2018 1404   LEUKOCYTESUR NEGATIVE 02/16/2018 1404    Radiological Exams on Admission: Dg Chest 2 View  Result Date: 02/16/2018 CLINICAL DATA:  Weakness for 4 days. History of hypertension. Former smoker. EXAM: CHEST - 2 VIEW COMPARISON:  10/29/2016 and 06/28/2016 radiographs. 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 The left lung is clear. There is no pleural effusion  or pneumothorax. The heart size and mediastinal contours are stable with aortic atherosclerosis. There are degenerative changes within the lumbar spine. No acute osseous findings. IMPRESSION: Stable chest with chronic right hemidiaphragm eventration and right basilar atelectasis/scarring. No acute cardiopulmonary process. Electronically Signed   By: Richardean Sale M.D.   On:  02/16/2018 15:12   Ct Head Wo Contrast  Result Date: 02/16/2018 CLINICAL DATA:  83 year old female with generalized weakness for 4 days. Initial encounter. EXAM: CT HEAD WITHOUT CONTRAST TECHNIQUE: Contiguous axial images were obtained from the base of the skull through the vertex without intravenous contrast. COMPARISON:  10/29/2016 CT. FINDINGS: Brain: No intracranial hemorrhage or CT evidence of large acute infarct. Mild chronic microvascular changes. Mild global atrophy. No intracranial mass lesion noted on this unenhanced exam. Vascular: Vascular calcifications Skull: Negative Sinuses/Orbits: No acute orbital abnormality. Visualized paranasal sinuses are clear. Other: Mastoid air cells and middle ear cavities are clear. IMPRESSION: No acute intracranial abnormality. Mild chronic microvascular changes. Mild atrophy. Electronically Signed   By: Genia Del M.D.   On: 02/16/2018 14:51   Ct Angio Chest Pe W And/or Wo Contrast  Result Date: 02/16/2018 CLINICAL DATA:  Malaise for several days EXAM: CT ANGIOGRAPHY CHEST WITH CONTRAST TECHNIQUE: Multidetector CT imaging of the chest was performed using the standard protocol during bolus administration of intravenous contrast. Multiplanar CT image reconstructions and MIPs were obtained to evaluate the vascular anatomy. CONTRAST:  8mL ISOVUE-370 IOPAMIDOL (ISOVUE-370) INJECTION 76% COMPARISON:  Chest x-ray from earlier in the same day. FINDINGS: Cardiovascular: Thoracic aorta demonstrates atherosclerotic calcifications without aneurysmal dilatation or dissection. Coronary calcifications  are seen. No cardiac enlargement is noted. The pulmonary artery shows a normal branching pattern. Scattered filling defects are noted within the left lower lobe branches consistent with acute pulmonary emboli. A few upper lobe emboli on the right are noted as well. Mediastinum/Nodes: The esophagus is within normal limits. The thoracic inlet is unremarkable. No significant hilar or mediastinal adenopathy is noted. Lungs/Pleura: The lungs are well aerated bilaterally. No focal infiltrate or sizable effusion is noted. Mild atelectatic changes in the right middle and right lower lobes are seen. Parenchymal nodules are noted. Upper Abdomen: A few hypodensities within the liver are noted likely representing cysts. No other upper abdominal abnormality is seen. Musculoskeletal: Degenerative changes of the thoracic spine are noted. No other focal abnormality is seen. Review of the MIP images confirms the above findings. IMPRESSION: Scattered bilateral pulmonary emboli as described above. Mild atelectatic changes as described. Electronically Signed   By: Inez Catalina M.D.   On: 02/16/2018 19:09    EKG: Independently reviewed. Vent. rate 83 BPM PR interval * ms QRS duration 94 ms QT/QTc 368/433 ms P-R-T axes 68 53 44 Sinus rhythm Low voltage, precordial leads No significant change since last tracing  Assessment/Plan Principal Problem:   Bilateral pulmonary embolism (HCC) Admit to telemetry/inpatient. Continue heparin infusion. Trend troponin level. Check echocardiogram.  Active Problems:   GERD (gastroesophageal reflux disease) Protonix 40 mg p.o. daily.    DM type 2 (diabetes mellitus, type 2) (HCC) Carbohydrate modified diet. Continue metformin 500 mg p.o. twice daily. CBG monitoring with regular insulin sliding scale.    Essential hypertension Alogliptin 10 mg p.o. daily. Losartan 25 mg p.o. daily. Monitor blood pressure, BUN, creatinine and electrolytes.    Hyperlipidemia Continue  simvastatin 40 mg p.o. daily. Check LFTs as needed.    Mild dementia Continue Namenda 5 mg p.o. twice daily Daily alprazolam as needed for anxiety. Continue Remeron 30 mg p.o. at bedtime.    Diabetic peripheral neuropathy (HCC) Continue gabapentin 400 mg p.o. 4 times a day.    DVT prophylaxis: On heparin infusion. Code Status: Full code. Family Communication:  Disposition Plan:  Admit for further evaluation and treatment of PE. Consults called:  Admission status: Inpatient/telemetry.  Reubin Milan MD [facility] Pager [phone].  If 7PM-7AM, please contact night-coverage [email or web address] Password [email or web address]  02/16/2018, 10:50 PM

## 2018-02-17 ENCOUNTER — Inpatient Hospital Stay (HOSPITAL_COMMUNITY): Payer: Medicare Other

## 2018-02-17 DIAGNOSIS — K219 Gastro-esophageal reflux disease without esophagitis: Secondary | ICD-10-CM

## 2018-02-17 DIAGNOSIS — F0390 Unspecified dementia without behavioral disturbance: Secondary | ICD-10-CM

## 2018-02-17 DIAGNOSIS — I2699 Other pulmonary embolism without acute cor pulmonale: Principal | ICD-10-CM

## 2018-02-17 DIAGNOSIS — I10 Essential (primary) hypertension: Secondary | ICD-10-CM

## 2018-02-17 DIAGNOSIS — E785 Hyperlipidemia, unspecified: Secondary | ICD-10-CM

## 2018-02-17 DIAGNOSIS — E1143 Type 2 diabetes mellitus with diabetic autonomic (poly)neuropathy: Secondary | ICD-10-CM

## 2018-02-17 DIAGNOSIS — E1142 Type 2 diabetes mellitus with diabetic polyneuropathy: Secondary | ICD-10-CM

## 2018-02-17 LAB — CBC
HEMATOCRIT: 37 % (ref 36.0–46.0)
HEMOGLOBIN: 11.7 g/dL — AB (ref 12.0–15.0)
MCH: 29.6 pg (ref 26.0–34.0)
MCHC: 31.6 g/dL (ref 30.0–36.0)
MCV: 93.7 fL (ref 78.0–100.0)
Platelets: 163 10*3/uL (ref 150–400)
RBC: 3.95 MIL/uL (ref 3.87–5.11)
RDW: 13 % (ref 11.5–15.5)
WBC: 6.2 10*3/uL (ref 4.0–10.5)

## 2018-02-17 LAB — ECHOCARDIOGRAM COMPLETE
HEIGHTINCHES: 63 in
Weight: 2903.02 oz

## 2018-02-17 LAB — BASIC METABOLIC PANEL
Anion gap: 12 (ref 5–15)
BUN: 7 mg/dL (ref 6–20)
CHLORIDE: 105 mmol/L (ref 101–111)
CO2: 24 mmol/L (ref 22–32)
CREATININE: 0.96 mg/dL (ref 0.44–1.00)
Calcium: 9 mg/dL (ref 8.9–10.3)
GFR calc Af Amer: >60 mL/min (ref >60)
GFR calc non Af Amer: 54 mL/min — ABNORMAL LOW (ref >60)
GLUCOSE: 126 mg/dL — AB (ref 65–99)
Potassium: 4.2 mmol/L (ref 3.5–5.1)
Sodium: 141 mmol/L (ref 135–145)

## 2018-02-17 LAB — GLUCOSE, CAPILLARY
Glucose-Capillary: 119 mg/dL — ABNORMAL HIGH (ref 65–99)
Glucose-Capillary: 104 mg/dL — ABNORMAL HIGH (ref 65–99)
Glucose-Capillary: 116 mg/dL — ABNORMAL HIGH (ref 65–99)

## 2018-02-17 LAB — TROPONIN I: Troponin I: <0.03 ng/mL (ref <0.03)

## 2018-02-17 LAB — HEPARIN LEVEL (UNFRACTIONATED): Heparin Unfractionated: 0.59 IU/mL (ref 0.30–0.70)

## 2018-02-17 MED ORDER — ORAL CARE MOUTH RINSE: 15.0000 mL | Freq: Two times a day (BID) | OROMUCOSAL | Status: DC

## 2018-02-17 MED ORDER — INSULIN ASPART 100 UNIT/ML ~~LOC~~ SOLN: 0.0000 [IU] | Freq: Three times a day (TID) | SUBCUTANEOUS | Status: DC

## 2018-02-17 MED ORDER — APIXABAN 5 MG PO TABS: 5.0000 mg | ORAL_TABLET | Freq: Two times a day (BID) | ORAL | 2 refills | Status: DC

## 2018-02-17 MED ORDER — APIXABAN 5 MG PO TABS: 5.0000 mg | ORAL_TABLET | Freq: Two times a day (BID) | ORAL | Status: DC

## 2018-02-17 MED ORDER — ATORVASTATIN CALCIUM 20 MG PO TABS
20.0000 mg | ORAL_TABLET | Freq: Every day | ORAL | Status: DC
  Administered 2018-02-17: 20 mg via ORAL
  Filled 2018-02-17: qty 1

## 2018-02-17 MED ORDER — APIXABAN 5 MG PO TABS: 10.0000 mg | ORAL_TABLET | Freq: Two times a day (BID) | ORAL | 0 refills | Status: DC

## 2018-02-17 MED ORDER — ACETAMINOPHEN 325 MG PO TABS: 650.0000 mg | ORAL_TABLET | Freq: Four times a day (QID) | ORAL | 0 refills | Status: DC | PRN

## 2018-02-17 MED ORDER — APIXABAN 5 MG PO TABS
10.0000 mg | ORAL_TABLET | Freq: Two times a day (BID) | ORAL | Status: DC
  Administered 2018-02-17: 10 mg via ORAL
  Filled 2018-02-17: qty 2

## 2018-02-17 NOTE — Progress Notes (Addendum)
[place] for Heparin >> Apixaban Indication: pulmonary embolus  Allergies  Allergen Reactions  . Penicillins Rash    Has patient had a PCN reaction causing immediate rash, facial/tongue/throat swelling, SOB or lightheadedness with hypotension: No Has patient had a PCN reaction causing severe rash involving mucus membranes or skin necrosis: No Has patient had a PCN reaction that required hospitalization: No Has patient had a PCN reaction occurring within the last 10 years: No If all of the above answers are "NO", then may proceed with Cephalosporin use.    Patient Measurements: Height: 5\' 3"  (160 cm) Weight: 181 lb 7 oz (82.3 kg) IBW/kg (Calculated) : 52.4 HEPARIN DW (KG): 70.3   Vital Signs: Temp: 97.7 F (36.5 C) (06/18 0532) Temp Source: Oral (06/18 0532) BP: 137/67 (06/18 0532) Pulse Rate: 97 (06/18 0532)  Labs: Recent Labs    02/16/18 1417 02/16/18 1424 02/16/18 2024 02/17/18 0345  HGB 11.8*  --   --  11.7*  HCT 37.1  --   --  37.0  PLT 223  --   --  163  APTT  --   --  26  --   LABPROT  --  12.8  --   --   INR  --  0.97  --   --   HEPARINUNFRC  --   --   --  0.59  CREATININE 1.12*  --   --  0.96  TROPONINI <0.03  --  <0.03 <0.03   Estimated Creatinine Clearance: 46.7 mL/min (by C-G formula based on SCr of 0.96 mg/dL).  Assessment: Okay for Protocol, baseline anticoagulant labs WNL.  New PE.  Heparin level at goal. CBC stable. Discussed during progression rounds, transition for Apixaban.  Goal of Therapy:  Heparin level 0.3-0.7 units/ml Monitor platelets by anticoagulation protocol: Yes   Plan:  Apixaban 10mg  BID x 7 days, then 5mg  BID. Educate. Monitor for signs and symptoms of bleeding.  Pricilla Larsson 02/17/2018,8:34 AM

## 2018-02-17 NOTE — Progress Notes (Signed)
At approximately 0000, this RN entered patient's room with patient pulling IV. Patient confused, agitated, combative. This RN was trying to protect IVs so patient didn't pull them out. Patient was repeatedly stating "stop it" and hitting and trying to kick this RN. Patient repeatedly stated that she had to have BM, trying to get OOB while RN wrapping IVs. Patient stated that she could not go in the grass. Patient assisted to bathroom. Safety mitts applied to prevent patient from removing IVs and being combative. Patient resting in bed at this time. Olevia Bowens, MD aware. Will continue to monitor.

## 2018-02-17 NOTE — Progress Notes (Signed)
*  PRELIMINARY RESULTS* Echocardiogram 2D Echocardiogram has been performed.  Leavy Cella 02/17/2018, 3:27 PM

## 2018-02-17 NOTE — Discharge Summary (Signed)
Physician Discharge Summary  Laurie Mcfarland EVO:[id number] DOB: 1935/07/27 DOA: 02/16/2018  PCP: [name], MD  Admit date: 02/16/2018 Discharge date: 02/17/2018  Time spent: 35 minutes  Recommendations for Outpatient Follow-up:  1. Repeat CBC to follow hemoglobin trend 2. Repeat basic metabolic panel to follow electrolytes and renal function.   Discharge Diagnoses:  Principal Problem:   Bilateral pulmonary embolism (HCC) Active Problems:   GERD (gastroesophageal reflux disease)   DM type 2 (diabetes mellitus, type 2) (HCC)   Essential hypertension   Hyperlipidemia   Mild dementia   Diabetic peripheral neuropathy ([place])   Discharge Condition: Stable and improved.  No requiring oxygen supplementation at discharge.  Patient discharged home with instruction to follow-up with PCP in 10 days.  Diet recommendation: Heart healthy diet and modify carbohydrates.  Filed Weights   02/16/18 1344 02/16/18 2057  Weight: 81.6 kg (180 lb) 82.3 kg (181 lb 7 oz)    History of present illness:  As per H&P written by Dr. Olevia Bowens on 02/16/2018. 83 y.o. female with medical history significant of, osteoarthritis, type 2 diabetes, diabetic peripheral neuropathy, fibromyalgia, GERD, hyperlipidemia, hypertension, dyspnea on exertion who is coming to the ED due to generalized fatigue/malaise since Thursday.  Her family also reported that she has been more confused than usual this past few days.  Family members felt that she was warm to touch, thought she may have a fever and called her PCP who recommended to bring her to the emergency department.  She recently had her nighttime medication and is unable to provide further history.  ED Course: Initial vital signs temperature 98.4 F, pulse 84, respirations 18, blood pressure 125/66 mmHg and O2 sat 90% on room air.  Her work-up shows a normal urinalysis.  CBC with a white count of 9.2, hemoglobin 11.8 g/dL and platelets 223.  PT/INR/PTT were within normal  limits.  Her d-dimer was 3.5 mcg/mL-FEU. BNP of 40.0 pg/mL and troponin less than 0.03 ng/mL.  Lipase was 27 units/L.  Her CMP shows normal electrolytes, normal LFTs, except for albumin of 3.4 g/dL.  BUN was 10, creatinine 1.12 and glucose 131 mg/dL.   Hospital Course:  1-bilateral pulmonary embolism: Without cor pulmonale. -Patient denies chest pain or shortness of breath currently -Troponin has been negative -2D echo reassuring and demonstrating no right heart strain. -Patient will be treated with Eliquis. -Patient advised to avoid the use of NSAIDs  2-gastroesophageal reflux disease -Continue PPI  3-Type 2 diabetes mellitus with neuropathy -Continue modified carbohydrates -Continue oral hypoglycemic regimen -Continue Neurontin.  4-essential hypertension -Stable and well-controlled -Continue home antihypertensive regimen -Heart healthy diet has been advised.  5-hyperlipidemia -Continue statins.  6-mild dementia -No behavioral disturbances appreciated on examination -Continue the use of Namenda. -Continue supportive care  7-prior history of depression/anxiety  -continue as needed alprazolam and continue Remeron nightly.   Procedures:  2D echo: No wall motion abnormalities, preserved ejection fraction, no right heart strain appreciated.  Consultations:  None   Discharge Exam: Vitals:   02/17/18 0532 02/17/18 1352  BP: 137/67 135/60  Pulse: 97 82  Resp:    Temp: 97.7 F (36.5 C) 97.6 F (36.4 C)  SpO2: 95% 93%    General: Afebrile, no chest pain, feeling good and denying shortness of breath currently. Cardiovascular: S1 and S2, no rubs, no gallops, no JVD. Respiratory: Clear to auscultation bilaterally, no using accessory muscles; no tachypnea.  Abdomen: Soft, nontender, nondistended, positive bowel sounds Extremities: No edema, no cyanosis or clubbing.  Discharge Instructions  Discharge Instructions    Diet - low sodium heart healthy   Complete by:   As directed    Diet Carb Modified   Complete by:  As directed    Discharge instructions   Complete by:  As directed    Take medications as prescribed. Avoid the use of NSAIDs (okay to continue baby aspirin). Keep yourself well-hydrated. Arrange follow-up with PCP in 10 days.     Allergies as of 02/17/2018      Reactions   Penicillins Rash   Has patient had a PCN reaction causing immediate rash, facial/tongue/throat swelling, SOB or lightheadedness with hypotension: No Has patient had a PCN reaction causing severe rash involving mucus membranes or skin necrosis: No Has patient had a PCN reaction that required hospitalization: No Has patient had a PCN reaction occurring within the last 10 years: No If all of the above answers are "NO", then may proceed with Cephalosporin use.      Medication List    STOP taking these medications   meloxicam 7.5 MG tablet Commonly known as:  MOBIC     TAKE these medications   acetaminophen 325 MG tablet Commonly known as:  TYLENOL Take 2 tablets (650 mg total) by mouth every 6 (six) hours as needed for mild pain or headache (or Fever >/= 101).   ALPRAZolam 1 MG tablet Commonly known as:  XANAX Take 1 mg by mouth 4 (four) times daily as needed for anxiety. Nerves   amLODipine 10 MG tablet Commonly known as:  NORVASC Take 10 mg by mouth daily.   apixaban 5 MG Tabs tablet Commonly known as:  ELIQUIS Take 2 tablets (10 mg total) by mouth 2 (two) times daily for 13 doses.   apixaban 5 MG Tabs tablet Commonly known as:  ELIQUIS Take 1 tablet (5 mg total) by mouth 2 (two) times daily. Start taking on:  02/24/2018   aspirin EC 81 MG tablet Take 81 mg by mouth every morning.   CRANBERRY FRUIT PO Take 1 capsule by mouth daily.   gabapentin 400 MG capsule Commonly known as:  NEURONTIN Take 400 mg by mouth 4 (four) times daily.   losartan 25 MG tablet Commonly known as:  COZAAR Take 25 mg by mouth daily.   memantine 5 MG  tablet Commonly known as:  NAMENDA Take 5 mg by mouth 2 (two) times daily.   metFORMIN 500 MG tablet Commonly known as:  GLUCOPHAGE Take 500 mg by mouth 2 (two) times daily with a meal.   mirtazapine 30 MG tablet Commonly known as:  REMERON Take 30 mg by mouth at bedtime.   multivitamin with minerals Tabs tablet Take 1 tablet by mouth daily.   multivitamin-lutein Caps capsule Take 1 capsule by mouth every morning.   omeprazole 40 MG capsule Commonly known as:  PRILOSEC Take 40 mg by mouth daily.   oxyCODONE 5 MG immediate release tablet Commonly known as:  Oxy IR/ROXICODONE Take 1 tablet by mouth every 4 (four) hours as needed. For pain   potassium chloride SA 20 MEQ tablet Commonly known as:  K-DUR,KLOR-CON Take 20 mEq by mouth every morning.   simvastatin 40 MG tablet Commonly known as:  ZOCOR Take 40 mg by mouth daily.   vitamin C 500 MG tablet Commonly known as:  ASCORBIC ACID Take 500 mg by mouth every morning.   Vitamin D-3 1000 units Caps Take 1 capsule by mouth 2 (two) times daily.      Allergies  Allergen Reactions  .  Penicillins Rash    Has patient had a PCN reaction causing immediate rash, facial/tongue/throat swelling, SOB or lightheadedness with hypotension: No Has patient had a PCN reaction causing severe rash involving mucus membranes or skin necrosis: No Has patient had a PCN reaction that required hospitalization: No Has patient had a PCN reaction occurring within the last 10 years: No If all of the above answers are "NO", then may proceed with Cephalosporin use.    Follow-up Information    [name], MD. Schedule an appointment as soon as possible for a visit in 10 day(s).   Specialty:  Internal Medicine Contact information: [address] [place] [address] [phone]           The results of significant diagnostics from this hospitalization (including imaging, microbiology, ancillary and laboratory) are listed  below for reference.    Significant Diagnostic Studies: Dg Chest 2 View  Result Date: 02/16/2018 CLINICAL DATA:  Weakness for 4 days. History of hypertension. Former smoker. EXAM: CHEST - 2 VIEW COMPARISON:  10/29/2016 and 06/28/2016 radiographs. 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 The left lung is clear. There is no pleural effusion or pneumothorax. The heart size and mediastinal contours are stable with aortic atherosclerosis. There are degenerative changes within the lumbar spine. No acute osseous findings. IMPRESSION: Stable chest with chronic right hemidiaphragm eventration and right basilar atelectasis/scarring. No acute cardiopulmonary process. Electronically Signed   By: Richardean Sale M.D.   On: 02/16/2018 15:12   Ct Head Wo Contrast  Result Date: 02/16/2018 CLINICAL DATA:  83 year old female with generalized weakness for 4 days. Initial encounter. EXAM: CT HEAD WITHOUT CONTRAST TECHNIQUE: Contiguous axial images were obtained from the base of the skull through the vertex without intravenous contrast. COMPARISON:  10/29/2016 CT. FINDINGS: Brain: No intracranial hemorrhage or CT evidence of large acute infarct. Mild chronic microvascular changes. Mild global atrophy. No intracranial mass lesion noted on this unenhanced exam. Vascular: Vascular calcifications Skull: Negative Sinuses/Orbits: No acute orbital abnormality. Visualized paranasal sinuses are clear. Other: Mastoid air cells and middle ear cavities are clear. IMPRESSION: No acute intracranial abnormality. Mild chronic microvascular changes. Mild atrophy. Electronically Signed   By: Genia Del M.D.   On: 02/16/2018 14:51   Ct Angio Chest Pe W And/or Wo Contrast  Result Date: 02/16/2018 CLINICAL DATA:  Malaise for several days EXAM: CT ANGIOGRAPHY CHEST WITH CONTRAST TECHNIQUE: Multidetector CT imaging of the chest was performed using the standard  protocol during bolus administration of intravenous contrast. Multiplanar CT image reconstructions and MIPs were obtained to evaluate the vascular anatomy. CONTRAST:  74mL ISOVUE-370 IOPAMIDOL (ISOVUE-370) INJECTION 76% COMPARISON:  Chest x-ray from earlier in the same day. FINDINGS: Cardiovascular: Thoracic aorta demonstrates atherosclerotic calcifications without aneurysmal dilatation or dissection. Coronary calcifications are seen. No cardiac enlargement is noted. The pulmonary artery shows a normal branching pattern. Scattered filling defects are noted within the left lower lobe branches consistent with acute pulmonary emboli. A few upper lobe emboli on the right are noted as well. Mediastinum/Nodes: The esophagus is within normal limits. The thoracic inlet is unremarkable. No significant hilar or mediastinal adenopathy is noted. Lungs/Pleura: The lungs are well aerated bilaterally. No focal infiltrate or sizable effusion is noted. Mild atelectatic changes in the right middle and right lower lobes are seen. Parenchymal nodules are noted. Upper Abdomen: A few hypodensities within the liver are noted likely representing cysts. No other upper abdominal abnormality is seen. Musculoskeletal: Degenerative changes of  the thoracic spine are noted. No other focal abnormality is seen. Review of the MIP images confirms the above findings. IMPRESSION: Scattered bilateral pulmonary emboli as described above. Mild atelectatic changes as described. Electronically Signed   By: Inez Catalina M.D.   On: 02/16/2018 19:09   Labs: Basic Metabolic Panel: Recent Labs  Lab 02/16/18 1417 02/16/18 1424 02/17/18 0345  NA 140  --  141  K 4.2  --  4.2  CL 102  --  105  CO2 29  --  24  GLUCOSE 131*  --  126*  BUN 10  --  7  CREATININE 1.12*  --  0.96  CALCIUM 8.8*  --  9.0  MG  --  1.9  --   PHOS  --  3.6  --    Liver Function Tests: Recent Labs  Lab 02/16/18 1417  AST 25  ALT 17  ALKPHOS 72  BILITOT 0.4  PROT 7.0   ALBUMIN 3.4*   Recent Labs  Lab 02/16/18 1417  LIPASE 27   CBC: Recent Labs  Lab 02/16/18 1417 02/17/18 0345  WBC 9.2 6.2  HGB 11.8* 11.7*  HCT 37.1 37.0  MCV 94.4 93.7  PLT 223 163   Cardiac Enzymes: Recent Labs  Lab 02/16/18 1417 02/16/18 2024 02/17/18 0345  TROPONINI <0.03 <0.03 <0.03   BNP: BNP (last 3 results) Recent Labs    02/16/18 1417  BNP 40.0   CBG: Recent Labs  Lab 02/17/18 0754 02/17/18 1137 02/17/18 1646  GLUCAP 119* 104* 116*   Signed:  Barton Dubois MD.  [facility] 02/17/2018, 5:00 PM

## 2018-03-02 DIAGNOSIS — Z1389 Encounter for screening for other disorder: Secondary | ICD-10-CM | POA: Diagnosis not present

## 2018-03-02 DIAGNOSIS — E1140 Type 2 diabetes mellitus with diabetic neuropathy, unspecified: Secondary | ICD-10-CM | POA: Diagnosis not present

## 2018-03-02 DIAGNOSIS — I2699 Other pulmonary embolism without acute cor pulmonale: Secondary | ICD-10-CM | POA: Diagnosis not present

## 2018-03-02 DIAGNOSIS — K219 Gastro-esophageal reflux disease without esophagitis: Secondary | ICD-10-CM | POA: Diagnosis not present

## 2018-03-31 DIAGNOSIS — M25571 Pain in right ankle and joints of right foot: Secondary | ICD-10-CM | POA: Diagnosis not present

## 2018-03-31 DIAGNOSIS — M25572 Pain in left ankle and joints of left foot: Secondary | ICD-10-CM | POA: Diagnosis not present

## 2018-04-20 DIAGNOSIS — I10 Essential (primary) hypertension: Secondary | ICD-10-CM | POA: Diagnosis not present

## 2018-04-20 DIAGNOSIS — G894 Chronic pain syndrome: Secondary | ICD-10-CM | POA: Diagnosis not present

## 2018-04-20 DIAGNOSIS — M1991 Primary osteoarthritis, unspecified site: Secondary | ICD-10-CM | POA: Diagnosis not present

## 2018-04-30 ENCOUNTER — Other Ambulatory Visit (HOSPITAL_COMMUNITY): Payer: Self-pay | Admitting: Internal Medicine

## 2018-04-30 DIAGNOSIS — Z1231 Encounter for screening mammogram for malignant neoplasm of breast: Secondary | ICD-10-CM

## 2018-05-06 ENCOUNTER — Other Ambulatory Visit: Payer: Self-pay

## 2018-05-06 NOTE — Patient Outreach (Signed)
[facility]) Care Management  05/06/2018  Laurie Mcfarland 09-23-1934 [id number]   Medication Adherence call to Laurie Mcfarland spoke with patient she was not sure if she is to be taking Metformin 500 mg and Simvastatin 40 mg call doctor's office left a message for doctor to clarify if she is to be taking both medication doctor office  call back they said patient is to be on both medication,call patient back to let her know she is to be taking both medication patient ask if we can order both medication from the pharmacy and will have her grandson pick them up from the pharmacy. Laurie Mcfarland is showing past due under [place].  [place] Management Direct Dial [phone]  Fax [phone] [email or web address]

## 2018-06-04 DIAGNOSIS — I639 Cerebral infarction, unspecified: Secondary | ICD-10-CM | POA: Diagnosis not present

## 2018-06-04 DIAGNOSIS — M1991 Primary osteoarthritis, unspecified site: Secondary | ICD-10-CM | POA: Diagnosis not present

## 2018-06-04 DIAGNOSIS — G894 Chronic pain syndrome: Secondary | ICD-10-CM | POA: Diagnosis not present

## 2018-06-11 DIAGNOSIS — Z8673 Personal history of transient ischemic attack (TIA), and cerebral infarction without residual deficits: Secondary | ICD-10-CM | POA: Diagnosis not present

## 2018-06-11 DIAGNOSIS — M19041 Primary osteoarthritis, right hand: Secondary | ICD-10-CM | POA: Diagnosis not present

## 2018-06-11 DIAGNOSIS — I69354 Hemiplegia and hemiparesis following cerebral infarction affecting left non-dominant side: Secondary | ICD-10-CM | POA: Diagnosis not present

## 2018-06-11 DIAGNOSIS — M19042 Primary osteoarthritis, left hand: Secondary | ICD-10-CM | POA: Diagnosis not present

## 2018-06-11 DIAGNOSIS — K219 Gastro-esophageal reflux disease without esophagitis: Secondary | ICD-10-CM | POA: Diagnosis not present

## 2018-06-11 DIAGNOSIS — Z7901 Long term (current) use of anticoagulants: Secondary | ICD-10-CM | POA: Diagnosis not present

## 2018-06-11 DIAGNOSIS — Z7984 Long term (current) use of oral hypoglycemic drugs: Secondary | ICD-10-CM | POA: Diagnosis not present

## 2018-06-11 DIAGNOSIS — Z79891 Long term (current) use of opiate analgesic: Secondary | ICD-10-CM | POA: Diagnosis not present

## 2018-06-11 DIAGNOSIS — G894 Chronic pain syndrome: Secondary | ICD-10-CM | POA: Diagnosis not present

## 2018-06-12 DIAGNOSIS — Z79891 Long term (current) use of opiate analgesic: Secondary | ICD-10-CM | POA: Diagnosis not present

## 2018-06-12 DIAGNOSIS — K219 Gastro-esophageal reflux disease without esophagitis: Secondary | ICD-10-CM | POA: Diagnosis not present

## 2018-06-12 DIAGNOSIS — Z7984 Long term (current) use of oral hypoglycemic drugs: Secondary | ICD-10-CM | POA: Diagnosis not present

## 2018-06-12 DIAGNOSIS — M19042 Primary osteoarthritis, left hand: Secondary | ICD-10-CM | POA: Diagnosis not present

## 2018-06-12 DIAGNOSIS — Z7901 Long term (current) use of anticoagulants: Secondary | ICD-10-CM | POA: Diagnosis not present

## 2018-06-12 DIAGNOSIS — Z8673 Personal history of transient ischemic attack (TIA), and cerebral infarction without residual deficits: Secondary | ICD-10-CM | POA: Diagnosis not present

## 2018-06-12 DIAGNOSIS — M19041 Primary osteoarthritis, right hand: Secondary | ICD-10-CM | POA: Diagnosis not present

## 2018-06-12 DIAGNOSIS — G894 Chronic pain syndrome: Secondary | ICD-10-CM | POA: Diagnosis not present

## 2018-06-12 DIAGNOSIS — I69354 Hemiplegia and hemiparesis following cerebral infarction affecting left non-dominant side: Secondary | ICD-10-CM | POA: Diagnosis not present

## 2018-06-16 DIAGNOSIS — Z7901 Long term (current) use of anticoagulants: Secondary | ICD-10-CM | POA: Diagnosis not present

## 2018-06-16 DIAGNOSIS — K219 Gastro-esophageal reflux disease without esophagitis: Secondary | ICD-10-CM | POA: Diagnosis not present

## 2018-06-16 DIAGNOSIS — M19041 Primary osteoarthritis, right hand: Secondary | ICD-10-CM | POA: Diagnosis not present

## 2018-06-16 DIAGNOSIS — M19042 Primary osteoarthritis, left hand: Secondary | ICD-10-CM | POA: Diagnosis not present

## 2018-06-16 DIAGNOSIS — Z79891 Long term (current) use of opiate analgesic: Secondary | ICD-10-CM | POA: Diagnosis not present

## 2018-06-16 DIAGNOSIS — Z8673 Personal history of transient ischemic attack (TIA), and cerebral infarction without residual deficits: Secondary | ICD-10-CM | POA: Diagnosis not present

## 2018-06-16 DIAGNOSIS — G894 Chronic pain syndrome: Secondary | ICD-10-CM | POA: Diagnosis not present

## 2018-06-16 DIAGNOSIS — Z7984 Long term (current) use of oral hypoglycemic drugs: Secondary | ICD-10-CM | POA: Diagnosis not present

## 2018-06-16 DIAGNOSIS — I69354 Hemiplegia and hemiparesis following cerebral infarction affecting left non-dominant side: Secondary | ICD-10-CM | POA: Diagnosis not present

## 2018-06-18 DIAGNOSIS — I69354 Hemiplegia and hemiparesis following cerebral infarction affecting left non-dominant side: Secondary | ICD-10-CM | POA: Diagnosis not present

## 2018-06-18 DIAGNOSIS — G894 Chronic pain syndrome: Secondary | ICD-10-CM | POA: Diagnosis not present

## 2018-06-18 DIAGNOSIS — M19042 Primary osteoarthritis, left hand: Secondary | ICD-10-CM | POA: Diagnosis not present

## 2018-06-18 DIAGNOSIS — Z7901 Long term (current) use of anticoagulants: Secondary | ICD-10-CM | POA: Diagnosis not present

## 2018-06-18 DIAGNOSIS — Z7984 Long term (current) use of oral hypoglycemic drugs: Secondary | ICD-10-CM | POA: Diagnosis not present

## 2018-06-18 DIAGNOSIS — Z79891 Long term (current) use of opiate analgesic: Secondary | ICD-10-CM | POA: Diagnosis not present

## 2018-06-18 DIAGNOSIS — K219 Gastro-esophageal reflux disease without esophagitis: Secondary | ICD-10-CM | POA: Diagnosis not present

## 2018-06-18 DIAGNOSIS — M19041 Primary osteoarthritis, right hand: Secondary | ICD-10-CM | POA: Diagnosis not present

## 2018-06-18 DIAGNOSIS — Z8673 Personal history of transient ischemic attack (TIA), and cerebral infarction without residual deficits: Secondary | ICD-10-CM | POA: Diagnosis not present

## 2018-06-19 DIAGNOSIS — I69354 Hemiplegia and hemiparesis following cerebral infarction affecting left non-dominant side: Secondary | ICD-10-CM | POA: Diagnosis not present

## 2018-06-19 DIAGNOSIS — Z8673 Personal history of transient ischemic attack (TIA), and cerebral infarction without residual deficits: Secondary | ICD-10-CM | POA: Diagnosis not present

## 2018-06-19 DIAGNOSIS — G894 Chronic pain syndrome: Secondary | ICD-10-CM | POA: Diagnosis not present

## 2018-06-19 DIAGNOSIS — Z79891 Long term (current) use of opiate analgesic: Secondary | ICD-10-CM | POA: Diagnosis not present

## 2018-06-19 DIAGNOSIS — M19042 Primary osteoarthritis, left hand: Secondary | ICD-10-CM | POA: Diagnosis not present

## 2018-06-19 DIAGNOSIS — Z7901 Long term (current) use of anticoagulants: Secondary | ICD-10-CM | POA: Diagnosis not present

## 2018-06-19 DIAGNOSIS — K219 Gastro-esophageal reflux disease without esophagitis: Secondary | ICD-10-CM | POA: Diagnosis not present

## 2018-06-19 DIAGNOSIS — Z7984 Long term (current) use of oral hypoglycemic drugs: Secondary | ICD-10-CM | POA: Diagnosis not present

## 2018-06-19 DIAGNOSIS — M19041 Primary osteoarthritis, right hand: Secondary | ICD-10-CM | POA: Diagnosis not present

## 2018-06-21 DIAGNOSIS — I69354 Hemiplegia and hemiparesis following cerebral infarction affecting left non-dominant side: Secondary | ICD-10-CM | POA: Diagnosis not present

## 2018-06-21 DIAGNOSIS — G894 Chronic pain syndrome: Secondary | ICD-10-CM | POA: Diagnosis not present

## 2018-06-21 DIAGNOSIS — M19041 Primary osteoarthritis, right hand: Secondary | ICD-10-CM | POA: Diagnosis not present

## 2018-06-23 DIAGNOSIS — Z7984 Long term (current) use of oral hypoglycemic drugs: Secondary | ICD-10-CM | POA: Diagnosis not present

## 2018-06-23 DIAGNOSIS — I69354 Hemiplegia and hemiparesis following cerebral infarction affecting left non-dominant side: Secondary | ICD-10-CM | POA: Diagnosis not present

## 2018-06-23 DIAGNOSIS — K219 Gastro-esophageal reflux disease without esophagitis: Secondary | ICD-10-CM | POA: Diagnosis not present

## 2018-06-23 DIAGNOSIS — Z8673 Personal history of transient ischemic attack (TIA), and cerebral infarction without residual deficits: Secondary | ICD-10-CM | POA: Diagnosis not present

## 2018-06-23 DIAGNOSIS — G894 Chronic pain syndrome: Secondary | ICD-10-CM | POA: Diagnosis not present

## 2018-06-23 DIAGNOSIS — M19042 Primary osteoarthritis, left hand: Secondary | ICD-10-CM | POA: Diagnosis not present

## 2018-06-23 DIAGNOSIS — M19041 Primary osteoarthritis, right hand: Secondary | ICD-10-CM | POA: Diagnosis not present

## 2018-06-23 DIAGNOSIS — Z7901 Long term (current) use of anticoagulants: Secondary | ICD-10-CM | POA: Diagnosis not present

## 2018-06-23 DIAGNOSIS — Z79891 Long term (current) use of opiate analgesic: Secondary | ICD-10-CM | POA: Diagnosis not present

## 2018-06-25 DIAGNOSIS — Z7984 Long term (current) use of oral hypoglycemic drugs: Secondary | ICD-10-CM | POA: Diagnosis not present

## 2018-06-25 DIAGNOSIS — G894 Chronic pain syndrome: Secondary | ICD-10-CM | POA: Diagnosis not present

## 2018-06-25 DIAGNOSIS — Z7901 Long term (current) use of anticoagulants: Secondary | ICD-10-CM | POA: Diagnosis not present

## 2018-06-25 DIAGNOSIS — K219 Gastro-esophageal reflux disease without esophagitis: Secondary | ICD-10-CM | POA: Diagnosis not present

## 2018-06-25 DIAGNOSIS — M19042 Primary osteoarthritis, left hand: Secondary | ICD-10-CM | POA: Diagnosis not present

## 2018-06-25 DIAGNOSIS — I69354 Hemiplegia and hemiparesis following cerebral infarction affecting left non-dominant side: Secondary | ICD-10-CM | POA: Diagnosis not present

## 2018-06-25 DIAGNOSIS — Z8673 Personal history of transient ischemic attack (TIA), and cerebral infarction without residual deficits: Secondary | ICD-10-CM | POA: Diagnosis not present

## 2018-06-25 DIAGNOSIS — M19041 Primary osteoarthritis, right hand: Secondary | ICD-10-CM | POA: Diagnosis not present

## 2018-06-25 DIAGNOSIS — Z79891 Long term (current) use of opiate analgesic: Secondary | ICD-10-CM | POA: Diagnosis not present

## 2018-06-29 DIAGNOSIS — Z7984 Long term (current) use of oral hypoglycemic drugs: Secondary | ICD-10-CM | POA: Diagnosis not present

## 2018-06-29 DIAGNOSIS — G894 Chronic pain syndrome: Secondary | ICD-10-CM | POA: Diagnosis not present

## 2018-06-29 DIAGNOSIS — Z7901 Long term (current) use of anticoagulants: Secondary | ICD-10-CM | POA: Diagnosis not present

## 2018-06-29 DIAGNOSIS — M19041 Primary osteoarthritis, right hand: Secondary | ICD-10-CM | POA: Diagnosis not present

## 2018-06-29 DIAGNOSIS — Z8673 Personal history of transient ischemic attack (TIA), and cerebral infarction without residual deficits: Secondary | ICD-10-CM | POA: Diagnosis not present

## 2018-06-29 DIAGNOSIS — M19042 Primary osteoarthritis, left hand: Secondary | ICD-10-CM | POA: Diagnosis not present

## 2018-06-29 DIAGNOSIS — K219 Gastro-esophageal reflux disease without esophagitis: Secondary | ICD-10-CM | POA: Diagnosis not present

## 2018-06-29 DIAGNOSIS — I69354 Hemiplegia and hemiparesis following cerebral infarction affecting left non-dominant side: Secondary | ICD-10-CM | POA: Diagnosis not present

## 2018-06-29 DIAGNOSIS — Z79891 Long term (current) use of opiate analgesic: Secondary | ICD-10-CM | POA: Diagnosis not present

## 2018-06-30 DIAGNOSIS — G894 Chronic pain syndrome: Secondary | ICD-10-CM | POA: Diagnosis not present

## 2018-06-30 DIAGNOSIS — I69354 Hemiplegia and hemiparesis following cerebral infarction affecting left non-dominant side: Secondary | ICD-10-CM | POA: Diagnosis not present

## 2018-06-30 DIAGNOSIS — M19042 Primary osteoarthritis, left hand: Secondary | ICD-10-CM | POA: Diagnosis not present

## 2018-06-30 DIAGNOSIS — M19041 Primary osteoarthritis, right hand: Secondary | ICD-10-CM | POA: Diagnosis not present

## 2018-06-30 DIAGNOSIS — Z7984 Long term (current) use of oral hypoglycemic drugs: Secondary | ICD-10-CM | POA: Diagnosis not present

## 2018-06-30 DIAGNOSIS — K219 Gastro-esophageal reflux disease without esophagitis: Secondary | ICD-10-CM | POA: Diagnosis not present

## 2018-06-30 DIAGNOSIS — Z7901 Long term (current) use of anticoagulants: Secondary | ICD-10-CM | POA: Diagnosis not present

## 2018-06-30 DIAGNOSIS — Z8673 Personal history of transient ischemic attack (TIA), and cerebral infarction without residual deficits: Secondary | ICD-10-CM | POA: Diagnosis not present

## 2018-06-30 DIAGNOSIS — Z79891 Long term (current) use of opiate analgesic: Secondary | ICD-10-CM | POA: Diagnosis not present

## 2018-07-01 DIAGNOSIS — G894 Chronic pain syndrome: Secondary | ICD-10-CM | POA: Diagnosis not present

## 2018-07-01 DIAGNOSIS — M19041 Primary osteoarthritis, right hand: Secondary | ICD-10-CM | POA: Diagnosis not present

## 2018-07-01 DIAGNOSIS — Z8673 Personal history of transient ischemic attack (TIA), and cerebral infarction without residual deficits: Secondary | ICD-10-CM | POA: Diagnosis not present

## 2018-07-01 DIAGNOSIS — I69354 Hemiplegia and hemiparesis following cerebral infarction affecting left non-dominant side: Secondary | ICD-10-CM | POA: Diagnosis not present

## 2018-07-01 DIAGNOSIS — Z7901 Long term (current) use of anticoagulants: Secondary | ICD-10-CM | POA: Diagnosis not present

## 2018-07-01 DIAGNOSIS — K219 Gastro-esophageal reflux disease without esophagitis: Secondary | ICD-10-CM | POA: Diagnosis not present

## 2018-07-01 DIAGNOSIS — Z79891 Long term (current) use of opiate analgesic: Secondary | ICD-10-CM | POA: Diagnosis not present

## 2018-07-01 DIAGNOSIS — Z7984 Long term (current) use of oral hypoglycemic drugs: Secondary | ICD-10-CM | POA: Diagnosis not present

## 2018-07-01 DIAGNOSIS — M19042 Primary osteoarthritis, left hand: Secondary | ICD-10-CM | POA: Diagnosis not present

## 2018-09-03 DIAGNOSIS — E782 Mixed hyperlipidemia: Secondary | ICD-10-CM | POA: Diagnosis not present

## 2018-09-03 DIAGNOSIS — Z0001 Encounter for general adult medical examination with abnormal findings: Secondary | ICD-10-CM | POA: Diagnosis not present

## 2018-09-03 DIAGNOSIS — G894 Chronic pain syndrome: Secondary | ICD-10-CM | POA: Diagnosis not present

## 2018-09-03 DIAGNOSIS — Z1389 Encounter for screening for other disorder: Secondary | ICD-10-CM | POA: Diagnosis not present

## 2018-09-03 DIAGNOSIS — E119 Type 2 diabetes mellitus without complications: Secondary | ICD-10-CM | POA: Diagnosis not present

## 2018-10-07 ENCOUNTER — Other Ambulatory Visit: Payer: Self-pay | Admitting: Internal Medicine

## 2018-10-07 ENCOUNTER — Other Ambulatory Visit (HOSPITAL_COMMUNITY): Payer: Self-pay | Admitting: Internal Medicine

## 2018-10-07 DIAGNOSIS — I10 Essential (primary) hypertension: Secondary | ICD-10-CM | POA: Diagnosis not present

## 2018-10-07 DIAGNOSIS — M1991 Primary osteoarthritis, unspecified site: Secondary | ICD-10-CM | POA: Diagnosis not present

## 2018-10-07 DIAGNOSIS — G894 Chronic pain syndrome: Secondary | ICD-10-CM | POA: Diagnosis not present

## 2018-10-07 DIAGNOSIS — R251 Tremor, unspecified: Secondary | ICD-10-CM | POA: Diagnosis not present

## 2018-10-07 DIAGNOSIS — R519 Headache, unspecified: Secondary | ICD-10-CM

## 2018-10-07 DIAGNOSIS — R51 Headache: Principal | ICD-10-CM

## 2018-10-07 DIAGNOSIS — Z79891 Long term (current) use of opiate analgesic: Secondary | ICD-10-CM | POA: Diagnosis not present

## 2018-10-19 ENCOUNTER — Ambulatory Visit (HOSPITAL_COMMUNITY)
Admission: RE | Admit: 2018-10-19 | Discharge: 2018-10-19 | Disposition: A | Discharge status: Home / Self Care | Payer: Medicare Other | Source: Ambulatory Visit | Attending: Internal Medicine | Admitting: Internal Medicine

## 2018-10-19 DIAGNOSIS — R51 Headache: Secondary | ICD-10-CM | POA: Diagnosis not present

## 2018-10-19 DIAGNOSIS — R519 Headache, unspecified: Secondary | ICD-10-CM

## 2018-10-19 DIAGNOSIS — R251 Tremor, unspecified: Secondary | ICD-10-CM | POA: Diagnosis not present

## 2018-10-22 DIAGNOSIS — N390 Urinary tract infection, site not specified: Secondary | ICD-10-CM | POA: Diagnosis not present

## 2018-11-05 DIAGNOSIS — R600 Localized edema: Secondary | ICD-10-CM | POA: Diagnosis not present

## 2018-11-05 DIAGNOSIS — N342 Other urethritis: Secondary | ICD-10-CM | POA: Diagnosis not present

## 2018-11-05 DIAGNOSIS — G894 Chronic pain syndrome: Secondary | ICD-10-CM | POA: Diagnosis not present

## 2018-12-03 DIAGNOSIS — G894 Chronic pain syndrome: Secondary | ICD-10-CM | POA: Diagnosis not present

## 2018-12-28 DIAGNOSIS — G894 Chronic pain syndrome: Secondary | ICD-10-CM | POA: Diagnosis not present

## 2018-12-28 DIAGNOSIS — M549 Dorsalgia, unspecified: Secondary | ICD-10-CM | POA: Diagnosis not present

## 2018-12-28 DIAGNOSIS — M1991 Primary osteoarthritis, unspecified site: Secondary | ICD-10-CM | POA: Diagnosis not present

## 2019-01-13 DIAGNOSIS — I10 Essential (primary) hypertension: Secondary | ICD-10-CM | POA: Diagnosis not present

## 2019-01-13 DIAGNOSIS — E1142 Type 2 diabetes mellitus with diabetic polyneuropathy: Secondary | ICD-10-CM | POA: Diagnosis not present

## 2019-01-13 DIAGNOSIS — K219 Gastro-esophageal reflux disease without esophagitis: Secondary | ICD-10-CM | POA: Diagnosis not present

## 2019-01-13 DIAGNOSIS — G894 Chronic pain syndrome: Secondary | ICD-10-CM | POA: Diagnosis not present

## 2019-02-09 DIAGNOSIS — G301 Alzheimer's disease with late onset: Secondary | ICD-10-CM | POA: Diagnosis not present

## 2019-02-09 DIAGNOSIS — R296 Repeated falls: Secondary | ICD-10-CM | POA: Diagnosis not present

## 2019-02-09 DIAGNOSIS — Z79899 Other long term (current) drug therapy: Secondary | ICD-10-CM | POA: Diagnosis not present

## 2019-02-09 DIAGNOSIS — I2782 Chronic pulmonary embolism: Secondary | ICD-10-CM | POA: Diagnosis not present

## 2019-02-24 DIAGNOSIS — E119 Type 2 diabetes mellitus without complications: Secondary | ICD-10-CM | POA: Diagnosis not present

## 2019-02-24 DIAGNOSIS — G894 Chronic pain syndrome: Secondary | ICD-10-CM | POA: Diagnosis not present

## 2019-02-24 DIAGNOSIS — H5319 Other subjective visual disturbances: Secondary | ICD-10-CM | POA: Diagnosis not present

## 2019-02-24 DIAGNOSIS — I10 Essential (primary) hypertension: Secondary | ICD-10-CM | POA: Diagnosis not present

## 2019-03-25 DIAGNOSIS — H524 Presbyopia: Secondary | ICD-10-CM | POA: Diagnosis not present

## 2019-03-25 DIAGNOSIS — H04123 Dry eye syndrome of bilateral lacrimal glands: Secondary | ICD-10-CM | POA: Diagnosis not present

## 2019-03-25 DIAGNOSIS — E1140 Type 2 diabetes mellitus with diabetic neuropathy, unspecified: Secondary | ICD-10-CM | POA: Diagnosis not present

## 2019-03-25 DIAGNOSIS — E119 Type 2 diabetes mellitus without complications: Secondary | ICD-10-CM | POA: Diagnosis not present

## 2019-03-25 DIAGNOSIS — Z961 Presence of intraocular lens: Secondary | ICD-10-CM | POA: Diagnosis not present

## 2019-03-29 DIAGNOSIS — G894 Chronic pain syndrome: Secondary | ICD-10-CM | POA: Diagnosis not present

## 2019-03-29 DIAGNOSIS — E1140 Type 2 diabetes mellitus with diabetic neuropathy, unspecified: Secondary | ICD-10-CM | POA: Diagnosis not present

## 2019-03-29 DIAGNOSIS — E119 Type 2 diabetes mellitus without complications: Secondary | ICD-10-CM | POA: Diagnosis not present

## 2019-03-29 DIAGNOSIS — H5710 Ocular pain, unspecified eye: Secondary | ICD-10-CM | POA: Diagnosis not present

## 2019-04-01 DIAGNOSIS — L718 Other rosacea: Secondary | ICD-10-CM | POA: Diagnosis not present

## 2019-04-28 DIAGNOSIS — G894 Chronic pain syndrome: Secondary | ICD-10-CM | POA: Diagnosis not present

## 2019-05-26 ENCOUNTER — Ambulatory Visit (HOSPITAL_COMMUNITY): Payer: Medicare Other

## 2019-05-26 ENCOUNTER — Other Ambulatory Visit: Payer: Self-pay | Admitting: Internal Medicine

## 2019-05-26 ENCOUNTER — Other Ambulatory Visit (HOSPITAL_COMMUNITY): Payer: Self-pay | Admitting: Internal Medicine

## 2019-05-26 DIAGNOSIS — M79605 Pain in left leg: Secondary | ICD-10-CM

## 2019-05-26 DIAGNOSIS — G894 Chronic pain syndrome: Secondary | ICD-10-CM | POA: Diagnosis not present

## 2019-05-27 ENCOUNTER — Other Ambulatory Visit: Payer: Self-pay

## 2019-05-27 ENCOUNTER — Ambulatory Visit (HOSPITAL_COMMUNITY)
Admission: RE | Admit: 2019-05-27 | Discharge: 2019-05-27 | Disposition: A | Discharge status: Home / Self Care | Payer: Medicare Other | Source: Ambulatory Visit | Attending: Internal Medicine | Admitting: Internal Medicine

## 2019-05-27 DIAGNOSIS — M79605 Pain in left leg: Secondary | ICD-10-CM | POA: Insufficient documentation

## 2019-06-24 DIAGNOSIS — M1991 Primary osteoarthritis, unspecified site: Secondary | ICD-10-CM | POA: Diagnosis not present

## 2019-06-24 DIAGNOSIS — L0390 Cellulitis, unspecified: Secondary | ICD-10-CM | POA: Diagnosis not present

## 2019-06-24 DIAGNOSIS — M549 Dorsalgia, unspecified: Secondary | ICD-10-CM | POA: Diagnosis not present

## 2019-06-24 DIAGNOSIS — G894 Chronic pain syndrome: Secondary | ICD-10-CM | POA: Diagnosis not present

## 2019-07-17 ENCOUNTER — Observation Stay (HOSPITAL_COMMUNITY): Payer: Medicare Other

## 2019-07-17 ENCOUNTER — Emergency Department (HOSPITAL_COMMUNITY): Payer: Medicare Other

## 2019-07-17 ENCOUNTER — Encounter (HOSPITAL_COMMUNITY): Payer: Self-pay | Admitting: Emergency Medicine

## 2019-07-17 ENCOUNTER — Inpatient Hospital Stay (HOSPITAL_COMMUNITY)
Admission: EM | Admit: 2019-07-17 | Discharge: 2019-07-20 | DRG: 378 | Disposition: A | Discharge status: Home / Self Care | Payer: Medicare Other | Attending: Internal Medicine | Admitting: Internal Medicine

## 2019-07-17 ENCOUNTER — Other Ambulatory Visit: Payer: Self-pay

## 2019-07-17 DIAGNOSIS — Z7982 Long term (current) use of aspirin: Secondary | ICD-10-CM

## 2019-07-17 DIAGNOSIS — B9681 Helicobacter pylori [H. pylori] as the cause of diseases classified elsewhere: Secondary | ICD-10-CM | POA: Diagnosis present

## 2019-07-17 DIAGNOSIS — E1122 Type 2 diabetes mellitus with diabetic chronic kidney disease: Secondary | ICD-10-CM | POA: Diagnosis present

## 2019-07-17 DIAGNOSIS — Z7901 Long term (current) use of anticoagulants: Secondary | ICD-10-CM

## 2019-07-17 DIAGNOSIS — Z90710 Acquired absence of both cervix and uterus: Secondary | ICD-10-CM | POA: Diagnosis not present

## 2019-07-17 DIAGNOSIS — Z9842 Cataract extraction status, left eye: Secondary | ICD-10-CM | POA: Diagnosis not present

## 2019-07-17 DIAGNOSIS — F419 Anxiety disorder, unspecified: Secondary | ICD-10-CM | POA: Diagnosis present

## 2019-07-17 DIAGNOSIS — Z87891 Personal history of nicotine dependence: Secondary | ICD-10-CM

## 2019-07-17 DIAGNOSIS — Z79899 Other long term (current) drug therapy: Secondary | ICD-10-CM

## 2019-07-17 DIAGNOSIS — D62 Acute posthemorrhagic anemia: Secondary | ICD-10-CM | POA: Diagnosis not present

## 2019-07-17 DIAGNOSIS — N289 Disorder of kidney and ureter, unspecified: Secondary | ICD-10-CM

## 2019-07-17 DIAGNOSIS — K2100 Gastro-esophageal reflux disease with esophagitis, without bleeding: Secondary | ICD-10-CM | POA: Diagnosis present

## 2019-07-17 DIAGNOSIS — K222 Esophageal obstruction: Secondary | ICD-10-CM | POA: Diagnosis present

## 2019-07-17 DIAGNOSIS — K625 Hemorrhage of anus and rectum: Secondary | ICD-10-CM

## 2019-07-17 DIAGNOSIS — K219 Gastro-esophageal reflux disease without esophagitis: Secondary | ICD-10-CM | POA: Diagnosis present

## 2019-07-17 DIAGNOSIS — K279 Peptic ulcer, site unspecified, unspecified as acute or chronic, without hemorrhage or perforation: Secondary | ICD-10-CM

## 2019-07-17 DIAGNOSIS — I10 Essential (primary) hypertension: Secondary | ICD-10-CM

## 2019-07-17 DIAGNOSIS — Z9841 Cataract extraction status, right eye: Secondary | ICD-10-CM | POA: Diagnosis not present

## 2019-07-17 DIAGNOSIS — N1831 Chronic kidney disease, stage 3a: Secondary | ICD-10-CM | POA: Diagnosis present

## 2019-07-17 DIAGNOSIS — Z9181 History of falling: Secondary | ICD-10-CM | POA: Diagnosis not present

## 2019-07-17 DIAGNOSIS — Z86711 Personal history of pulmonary embolism: Secondary | ICD-10-CM | POA: Diagnosis not present

## 2019-07-17 DIAGNOSIS — R531 Weakness: Secondary | ICD-10-CM | POA: Diagnosis not present

## 2019-07-17 DIAGNOSIS — E785 Hyperlipidemia, unspecified: Secondary | ICD-10-CM

## 2019-07-17 DIAGNOSIS — K259 Gastric ulcer, unspecified as acute or chronic, without hemorrhage or perforation: Secondary | ICD-10-CM | POA: Diagnosis present

## 2019-07-17 DIAGNOSIS — Z961 Presence of intraocular lens: Secondary | ICD-10-CM | POA: Diagnosis present

## 2019-07-17 DIAGNOSIS — Z20828 Contact with and (suspected) exposure to other viral communicable diseases: Secondary | ICD-10-CM | POA: Diagnosis present

## 2019-07-17 DIAGNOSIS — K921 Melena: Principal | ICD-10-CM | POA: Diagnosis present

## 2019-07-17 DIAGNOSIS — K228 Other specified diseases of esophagus: Secondary | ICD-10-CM | POA: Diagnosis present

## 2019-07-17 DIAGNOSIS — F329 Major depressive disorder, single episode, unspecified: Secondary | ICD-10-CM | POA: Diagnosis present

## 2019-07-17 DIAGNOSIS — I2699 Other pulmonary embolism without acute cor pulmonale: Secondary | ICD-10-CM | POA: Diagnosis not present

## 2019-07-17 DIAGNOSIS — K269 Duodenal ulcer, unspecified as acute or chronic, without hemorrhage or perforation: Secondary | ICD-10-CM | POA: Diagnosis not present

## 2019-07-17 DIAGNOSIS — K209 Esophagitis, unspecified without bleeding: Secondary | ICD-10-CM | POA: Diagnosis not present

## 2019-07-17 DIAGNOSIS — F0390 Unspecified dementia without behavioral disturbance: Secondary | ICD-10-CM | POA: Diagnosis present

## 2019-07-17 DIAGNOSIS — Z743 Need for continuous supervision: Secondary | ICD-10-CM | POA: Diagnosis not present

## 2019-07-17 DIAGNOSIS — R42 Dizziness and giddiness: Secondary | ICD-10-CM | POA: Diagnosis not present

## 2019-07-17 DIAGNOSIS — Z820 Family history of epilepsy and other diseases of the nervous system: Secondary | ICD-10-CM

## 2019-07-17 DIAGNOSIS — M797 Fibromyalgia: Secondary | ICD-10-CM

## 2019-07-17 DIAGNOSIS — Z79891 Long term (current) use of opiate analgesic: Secondary | ICD-10-CM

## 2019-07-17 DIAGNOSIS — D649 Anemia, unspecified: Secondary | ICD-10-CM

## 2019-07-17 DIAGNOSIS — Z880 Allergy status to penicillin: Secondary | ICD-10-CM

## 2019-07-17 DIAGNOSIS — E1143 Type 2 diabetes mellitus with diabetic autonomic (poly)neuropathy: Secondary | ICD-10-CM

## 2019-07-17 DIAGNOSIS — E1142 Type 2 diabetes mellitus with diabetic polyneuropathy: Secondary | ICD-10-CM | POA: Diagnosis present

## 2019-07-17 DIAGNOSIS — E119 Type 2 diabetes mellitus without complications: Secondary | ICD-10-CM

## 2019-07-17 DIAGNOSIS — Z7984 Long term (current) use of oral hypoglycemic drugs: Secondary | ICD-10-CM

## 2019-07-17 LAB — CBC WITH DIFFERENTIAL/PLATELET
Abs Immature Granulocytes: 0.08 10*3/uL — ABNORMAL HIGH (ref 0.00–0.07)
Basophils Absolute: 0 10*3/uL (ref 0.0–0.1)
Basophils Relative: 0 %
Eosinophils Absolute: 0 10*3/uL (ref 0.0–0.5)
Eosinophils Relative: 0 %
HCT: 22 % — ABNORMAL LOW (ref 36.0–46.0)
Hemoglobin: 6.3 g/dL — CL (ref 12.0–15.0)
Immature Granulocytes: 1 %
Lymphocytes Relative: 9 %
Lymphs Abs: 1.4 10*3/uL (ref 0.7–4.0)
MCH: 21 pg — ABNORMAL LOW (ref 26.0–34.0)
MCHC: 28.6 g/dL — ABNORMAL LOW (ref 30.0–36.0)
MCV: 73.3 fL — ABNORMAL LOW (ref 80.0–100.0)
Monocytes Absolute: 0.7 10*3/uL (ref 0.1–1.0)
Monocytes Relative: 5 %
Neutro Abs: 13.1 10*3/uL — ABNORMAL HIGH (ref 1.7–7.7)
Neutrophils Relative %: 85 %
Platelets: 634 10*3/uL — ABNORMAL HIGH (ref 150–400)
RBC: 3 MIL/uL — ABNORMAL LOW (ref 3.87–5.11)
RDW: 20.7 % — ABNORMAL HIGH (ref 11.5–15.5)
WBC: 15.3 10*3/uL — ABNORMAL HIGH (ref 4.0–10.5)
nRBC: 0 % (ref 0.0–0.2)

## 2019-07-17 LAB — COMPREHENSIVE METABOLIC PANEL
ALT: 13 U/L (ref 0–44)
AST: 17 U/L (ref 15–41)
Albumin: 2.8 g/dL — ABNORMAL LOW (ref 3.5–5.0)
Alkaline Phosphatase: 65 U/L (ref 38–126)
Anion gap: 12 (ref 5–15)
BUN: 36 mg/dL — ABNORMAL HIGH (ref 8–23)
CO2: 23 mmol/L (ref 22–32)
Calcium: 8.7 mg/dL — ABNORMAL LOW (ref 8.9–10.3)
Chloride: 101 mmol/L (ref 98–111)
Creatinine, Ser: 1.01 mg/dL — ABNORMAL HIGH (ref 0.44–1.00)
GFR calc Af Amer: >60 mL/min (ref >60)
GFR calc non Af Amer: 52 mL/min — ABNORMAL LOW (ref >60)
Glucose, Bld: 131 mg/dL — ABNORMAL HIGH (ref 70–99)
Potassium: 4.8 mmol/L (ref 3.5–5.1)
Sodium: 136 mmol/L (ref 135–145)
Total Bilirubin: 0.5 mg/dL (ref 0.3–1.2)
Total Protein: 6.5 g/dL (ref 6.5–8.1)

## 2019-07-17 LAB — TROPONIN I (HIGH SENSITIVITY)
Troponin I (High Sensitivity): 6 ng/L (ref <18)
Troponin I (High Sensitivity): 5 ng/L (ref <18)

## 2019-07-17 LAB — ABO/RH: ABO/RH(D): B POS

## 2019-07-17 LAB — HEMOGLOBIN A1C
Hgb A1c MFr Bld: 5.8 % — ABNORMAL HIGH (ref 4.8–5.6)
Mean Plasma Glucose: 119.76 mg/dL

## 2019-07-17 LAB — GLUCOSE, CAPILLARY
Glucose-Capillary: 140 mg/dL — ABNORMAL HIGH (ref 70–99)
Glucose-Capillary: 93 mg/dL (ref 70–99)

## 2019-07-17 LAB — PREPARE RBC (CROSSMATCH)

## 2019-07-17 LAB — POC OCCULT BLOOD, ED: Fecal Occult Bld: POSITIVE — AB

## 2019-07-17 MED ORDER — INSULIN ASPART 100 UNIT/ML ~~LOC~~ SOLN
0.0000 [IU] | Freq: Three times a day (TID) | SUBCUTANEOUS | Status: DC
  Administered 2019-07-17: 1 [IU] via SUBCUTANEOUS

## 2019-07-17 MED ORDER — SODIUM CHLORIDE 0.9% FLUSH: 3.0000 mL | INTRAVENOUS | Status: DC | PRN

## 2019-07-17 MED ORDER — VITAMIN C 500 MG PO TABS
500.0000 mg | ORAL_TABLET | Freq: Every morning | ORAL | Status: DC
  Administered 2019-07-18 – 2019-07-20 (×3): 500 mg via ORAL
  Filled 2019-07-17 (×3): qty 1

## 2019-07-17 MED ORDER — OXYCODONE HCL 5 MG PO TABS
5.0000 mg | ORAL_TABLET | Freq: Three times a day (TID) | ORAL | Status: DC | PRN
  Administered 2019-07-17 – 2019-07-20 (×5): 5 mg via ORAL
  Filled 2019-07-17 (×5): qty 1

## 2019-07-17 MED ORDER — SODIUM CHLORIDE 0.9 % IV SOLN: 250.0000 mL | INTRAVENOUS | Status: DC | PRN

## 2019-07-17 MED ORDER — POTASSIUM CHLORIDE CRYS ER 20 MEQ PO TBCR
20.0000 meq | EXTENDED_RELEASE_TABLET | Freq: Every morning | ORAL | Status: DC
  Administered 2019-07-18 – 2019-07-20 (×3): 20 meq via ORAL
  Filled 2019-07-17 (×3): qty 1

## 2019-07-17 MED ORDER — GABAPENTIN 300 MG PO CAPS
600.0000 mg | ORAL_CAPSULE | Freq: Three times a day (TID) | ORAL | Status: DC
  Administered 2019-07-17 – 2019-07-20 (×9): 600 mg via ORAL
  Filled 2019-07-17 (×9): qty 2

## 2019-07-17 MED ORDER — SIMVASTATIN 20 MG PO TABS
40.0000 mg | ORAL_TABLET | Freq: Every day | ORAL | Status: DC
  Administered 2019-07-17 – 2019-07-20 (×4): 40 mg via ORAL
  Filled 2019-07-17 (×4): qty 2

## 2019-07-17 MED ORDER — PANTOPRAZOLE SODIUM 40 MG IV SOLR
40.0000 mg | INTRAVENOUS | Status: DC
  Administered 2019-07-17: 40 mg via INTRAVENOUS
  Filled 2019-07-17: qty 40

## 2019-07-17 MED ORDER — AMLODIPINE BESYLATE 5 MG PO TABS
10.0000 mg | ORAL_TABLET | Freq: Every day | ORAL | Status: DC
  Administered 2019-07-17 – 2019-07-20 (×4): 10 mg via ORAL
  Filled 2019-07-17 (×4): qty 2

## 2019-07-17 MED ORDER — MIRTAZAPINE 30 MG PO TABS
30.0000 mg | ORAL_TABLET | Freq: Every day | ORAL | Status: DC
  Administered 2019-07-17 – 2019-07-19 (×3): 30 mg via ORAL
  Filled 2019-07-17 (×4): qty 1

## 2019-07-17 MED ORDER — ONDANSETRON HCL 4 MG PO TABS: 4.0000 mg | ORAL_TABLET | Freq: Four times a day (QID) | ORAL | Status: DC | PRN

## 2019-07-17 MED ORDER — OCUVITE-LUTEIN PO CAPS
1.0000 | ORAL_CAPSULE | Freq: Every morning | ORAL | Status: DC
  Administered 2019-07-18 – 2019-07-20 (×3): 1 via ORAL
  Filled 2019-07-17 (×3): qty 1

## 2019-07-17 MED ORDER — IOHEXOL 350 MG/ML SOLN
100.0000 mL | Freq: Once | INTRAVENOUS | Status: AC | PRN
  Administered 2019-07-17: 100 mL via INTRAVENOUS

## 2019-07-17 MED ORDER — ACETAMINOPHEN 325 MG PO TABS: 650.0000 mg | ORAL_TABLET | Freq: Four times a day (QID) | ORAL | Status: DC | PRN

## 2019-07-17 MED ORDER — INSULIN ASPART 100 UNIT/ML ~~LOC~~ SOLN: 0.0000 [IU] | Freq: Every day | SUBCUTANEOUS | Status: DC

## 2019-07-17 MED ORDER — BUSPIRONE HCL 5 MG PO TABS
5.0000 mg | ORAL_TABLET | Freq: Two times a day (BID) | ORAL | Status: DC
  Administered 2019-07-17 – 2019-07-20 (×6): 5 mg via ORAL
  Filled 2019-07-17 (×6): qty 1

## 2019-07-17 MED ORDER — TRAMADOL HCL 50 MG PO TABS
50.0000 mg | ORAL_TABLET | Freq: Three times a day (TID) | ORAL | Status: DC | PRN
  Administered 2019-07-18 – 2019-07-19 (×2): 50 mg via ORAL
  Filled 2019-07-17 (×3): qty 1

## 2019-07-17 MED ORDER — ONDANSETRON HCL 4 MG/2ML IJ SOLN: 4.0000 mg | Freq: Four times a day (QID) | INTRAMUSCULAR | Status: DC | PRN

## 2019-07-17 MED ORDER — MEMANTINE HCL 10 MG PO TABS
5.0000 mg | ORAL_TABLET | Freq: Two times a day (BID) | ORAL | Status: DC
  Administered 2019-07-17 – 2019-07-20 (×6): 5 mg via ORAL
  Filled 2019-07-17 (×6): qty 1

## 2019-07-17 MED ORDER — ALBUTEROL SULFATE (2.5 MG/3ML) 0.083% IN NEBU
3.0000 mL | INHALATION_SOLUTION | RESPIRATORY_TRACT | Status: DC | PRN
  Filled 2019-07-17: qty 3

## 2019-07-17 MED ORDER — PRIMIDONE 50 MG PO TABS
25.0000 mg | ORAL_TABLET | Freq: Every day | ORAL | Status: DC
  Administered 2019-07-17 – 2019-07-19 (×3): 25 mg via ORAL
  Filled 2019-07-17 (×4): qty 1

## 2019-07-17 MED ORDER — SODIUM CHLORIDE 0.9% FLUSH
3.0000 mL | Freq: Two times a day (BID) | INTRAVENOUS | Status: DC
  Administered 2019-07-17 – 2019-07-20 (×4): 3 mL via INTRAVENOUS

## 2019-07-17 MED ORDER — SODIUM CHLORIDE 0.9 % IV SOLN
10.0000 mL/h | Freq: Once | INTRAVENOUS | Status: AC
  Administered 2019-07-17: 10 mL/h via INTRAVENOUS

## 2019-07-17 NOTE — ED Notes (Signed)
Date and time results received: 07/17/19 1030 (use smartphrase ".now" to insert current time)  Test: HEMOGLOBLIN Critical Value: 6.3  Name of Provider Notified: ZAVITZ  Orders Received? Or Actions Taken?: SEE CHART

## 2019-07-17 NOTE — ED Notes (Signed)
ED TO INPATIENT HANDOFF REPORT  ED Nurse Name and Phone #: Rip Harbour RN  S Name/Age/Gender Laurie Mcfarland 84 y.o. female Room/Bed: APA08/APA08  Code Status   Code Status: Prior  Home/SNF/Other Home Patient oriented to: self Is this baseline? Yes   Triage Complete: Triage complete  Chief Complaint weakness  Triage Note Dizziness since yesterday.  cbg 215. Stroke scale neg per ems.  Pt a&o x 4.     Allergies Allergies  Allergen Reactions  . Penicillins Rash    Has patient had a PCN reaction causing immediate rash, facial/tongue/throat swelling, SOB or lightheadedness with hypotension: No Has patient had a PCN reaction causing severe rash involving mucus membranes or skin necrosis: No Has patient had a PCN reaction that required hospitalization: No Has patient had a PCN reaction occurring within the last 10 years: No If all of the above answers are "NO", then may proceed with Cephalosporin use.     Level of Care/Admitting Diagnosis ED Disposition    ED Disposition Condition [facility] Area: [facility] [id number]  Level of Care: Med-Surg [16]  Covid Evaluation: Asymptomatic Screening Protocol (No Symptoms)  Diagnosis: Acute blood loss anemia [id number]  Admitting Physician: Barton Dubois [[id number]]  Attending Physician: Barton Dubois [[id number]]  PT Class (Do Not Modify): Observation [104]  PT Acc Code (Do Not Modify): Observation [10022]       B Medical/Surgery History Past Medical History:  Diagnosis Date  . Anxiety   . Arthritis   . Complication of anesthesia    pt. states that she remembers parts of her surgery when she had her TAH  . Diabetes mellitus   . Diabetic peripheral neuropathy ([place])   . Fibromyalgia   . GERD (gastroesophageal reflux disease)   . Hyperlipidemia   . Hypertension   . Shortness of breath    with exertion   Past Surgical History:  Procedure Laterality Date  . ABDOMINAL HYSTERECTOMY    . CATARACT EXTRACTION W/PHACO   12/10/2011   Procedure: CATARACT EXTRACTION PHACO AND INTRAOCULAR LENS PLACEMENT (IOC);  Surgeon: Elta Guadeloupe T. Gershon Crane, MD;  Location: [facility];  Service: Ophthalmology;  Laterality: Left;  CDE=10.72  . CATARACT EXTRACTION W/PHACO  01/14/2012   Procedure: CATARACT EXTRACTION PHACO AND INTRAOCULAR LENS PLACEMENT (IOC);  Surgeon: Elta Guadeloupe T. Gershon Crane, MD;  Location: [facility];  Service: Ophthalmology;  Laterality: Right;  CDE:11.63  . ESOPHAGOGASTRODUODENOSCOPY (EGD) WITH ESOPHAGEAL DILATION N/A 06/17/2013   [id number] esophagus-status post Venia Minks dilation. Hiatal hernia. Gastric erosions s/p bx  . foreign body removal     splinter removed from right side of face     A IV Location/Drains/Wounds Patient Lines/Drains/Airways Status   Active Line/Drains/Airways    Name:   Placement date:   Placement time:   Site:   Days:   Peripheral IV 07/17/19 Left Antecubital   07/17/19    1245    Antecubital   less than 1   Peripheral IV 07/17/19 Right Antecubital   07/17/19    1326    Antecubital   less than 1   Incision 12/10/11 Eye Left   12/10/11    0932     2776   Incision 01/14/12 Eye Right   01/14/12    0954     2741          Intake/Output Last 24 hours No intake or output data in the 24 hours ending 07/17/19 1457  Labs/Imaging Results for orders placed or performed during the hospital encounter of  07/17/19 (from the past 48 hour(s))  CBC with Differential     Status: Abnormal   Collection Time: 07/17/19 10:05 AM  Result Value Ref Range   WBC 15.3 (H) 4.0 - 10.5 K/uL   RBC 3.00 (L) 3.87 - 5.11 MIL/uL   Hemoglobin 6.3 (LL) 12.0 - 15.0 g/dL    Comment: Reticulocyte Hemoglobin testing may be clinically indicated, consider ordering this additional test [id number] THIS CRITICAL RESULT HAS VERIFIED AND BEEN CALLED TO CHRIS W.,RN BY KATTIE YANCEY ON 11 14 2020 AT 1030, AND HAS BEEN READ BACK.     HCT 22.0 (L) 36.0 - 46.0 %   MCV 73.3 (L) 80.0 - 100.0 fL   MCH 21.0 (L) 26.0 - 34.0 pg   MCHC 28.6 (L) 30.0 -  36.0 g/dL   RDW 20.7 (H) 11.5 - 15.5 %   Platelets 634 (H) 150 - 400 K/uL   nRBC 0.0 0.0 - 0.2 %   Neutrophils Relative % 85 %   Neutro Abs 13.1 (H) 1.7 - 7.7 K/uL   Lymphocytes Relative 9 %   Lymphs Abs 1.4 0.7 - 4.0 K/uL   Monocytes Relative 5 %   Monocytes Absolute 0.7 0.1 - 1.0 K/uL   Eosinophils Relative 0 %   Eosinophils Absolute 0.0 0.0 - 0.5 K/uL   Basophils Relative 0 %   Basophils Absolute 0.0 0.0 - 0.1 K/uL   Immature Granulocytes 1 %   Abs Immature Granulocytes 0.08 (H) 0.00 - 0.07 K/uL    Comment: Performed at [facility], [address] [place], [place] [address]  Comprehensive metabolic panel     Status: Abnormal   Collection Time: 07/17/19 10:05 AM  Result Value Ref Range   Sodium 136 135 - 145 mmol/L   Potassium 4.8 3.5 - 5.1 mmol/L   Chloride 101 98 - 111 mmol/L   CO2 23 22 - 32 mmol/L   Glucose, Bld 131 (H) 70 - 99 mg/dL   BUN 36 (H) 8 - 23 mg/dL   Creatinine, Ser 1.01 (H) 0.44 - 1.00 mg/dL   Calcium 8.7 (L) 8.9 - 10.3 mg/dL   Total Protein 6.5 6.5 - 8.1 g/dL   Albumin 2.8 (L) 3.5 - 5.0 g/dL   AST 17 15 - 41 U/L   ALT 13 0 - 44 U/L   Alkaline Phosphatase 65 38 - 126 U/L   Total Bilirubin 0.5 0.3 - 1.2 mg/dL   GFR calc non Af Amer 52 (L) >60 mL/min   GFR calc Af Amer >60 >60 mL/min   Anion gap 12 5 - 15    Comment: Performed at [facility], [address] [place], [place] [address]  Troponin I (High Sensitivity)     Status: None   Collection Time: 07/17/19 10:05 AM  Result Value Ref Range   Troponin I (High Sensitivity) 6 <18 ng/L    Comment: (NOTE) Elevated high sensitivity troponin I (hsTnI) values and significant  changes across serial measurements may suggest ACS but many other  chronic and acute conditions are known to elevate hsTnI results.  Refer to the "Links" section for chest pain algorithms and additional  guidance. Performed at [facility], [address], [place], [place] [address]   POC occult blood, ED     Status: Abnormal    Collection Time: 07/17/19 10:45 AM  Result Value Ref Range   Fecal Occult Bld POSITIVE (A) NEGATIVE  Troponin I (High Sensitivity)     Status: None   Collection Time: 07/17/19  11:59 AM  Result Value Ref Range   Troponin I (High Sensitivity) 5 <18 ng/L    Comment: (NOTE) Elevated high sensitivity troponin I (hsTnI) values and significant  changes across serial measurements may suggest ACS but many other  chronic and acute conditions are known to elevate hsTnI results.  Refer to the "Links" section for chest pain algorithms and additional  guidance. Performed at [facility], [address] [place], [place] [address]   Prepare RBC     Status: None   Collection Time: 07/17/19 11:59 AM  Result Value Ref Range   Order Confirmation      ORDER PROCESSED BY BLOOD BANK Performed at [facility], [address], [place], [place] [address]   Type and screen [facility]     Status: None (Preliminary result)   Collection Time: 07/17/19 11:59 AM  Result Value Ref Range   ABO/RH(D) B POS    Antibody Screen NEG    Sample Expiration 07/20/2019,2359    Unit Number [id number]    Blood Component Type RBC LR PHER1    Unit division 00    Status of Unit ALLOCATED    Transfusion Status OK TO TRANSFUSE    Crossmatch Result      Compatible Performed at [facility], [address] [place], [place] [address]    Unit Number [id number]    Blood Component Type RED CELLS,LR    Unit division 00    Status of Unit ALLOCATED    Transfusion Status OK TO TRANSFUSE    Crossmatch Result Compatible   ABO/Rh     Status: None   Collection Time: 07/17/19 11:59 AM  Result Value Ref Range   ABO/RH(D)      B POS Performed at [facility], [address], [place], [place] [address]    Ct Head Wo Contrast  Result Date: 07/17/2019 CLINICAL DATA:  Weakness EXAM: CT HEAD WITHOUT CONTRAST TECHNIQUE: Contiguous axial images were obtained from the base of the skull through the vertex without intravenous  contrast. COMPARISON:  10/19/2018 FINDINGS: Brain: No evidence of acute infarction, hemorrhage, hydrocephalus, extra-axial collection or mass lesion/mass effect. Periventricular white matter hypodensity. Vascular: No hyperdense vessel or unexpected calcification. Skull: Normal. Negative for fracture or focal lesion. Sinuses/Orbits: No acute finding. Other: None. IMPRESSION: No acute intracranial pathology.  Small-vessel white matter disease. Electronically Signed   By: Eddie Candle M.D.   On: 07/17/2019 09:50    Pending Labs Unresulted Labs (From admission, onward)    Start     Ordered   07/17/19 1056  SARS CORONAVIRUS 2 (TAT 6-24 HRS) Nasopharyngeal Nasopharyngeal Swab  (Asymptomatic/Tier 2 Patients Labs)  Once,   STAT    Question Answer Comment  Is this test for diagnosis or screening Diagnosis of ill patient   Symptomatic for COVID-19 as defined by CDC Yes   Date of Symptom Onset 07/15/2019   Hospitalized for COVID-19 No   Admitted to ICU for COVID-19 No   Previously tested for COVID-19 No   Resident in a congregate (group) care setting No   Employed in healthcare setting No   Pregnant No      07/17/19 1057   07/17/19 0846  Urinalysis, Routine w reflex microscopic  Once,   STAT     07/17/19 0846   Unscheduled  Occult blood card to lab, stool  As needed,   R     07/17/19 1036          Vitals/Pain Today's Vitals  07/17/19 1030 07/17/19 1100 07/17/19 1130 07/17/19 1200  BP: 130/64 (!) 142/52 (!) 144/59 (!) 115/96  Pulse: 92 95 92 (!) 101  Resp: (!) 21 17 17    Temp:      TempSrc:      SpO2: 99% 99% 95% 92%  Weight:      Height:      PainSc:        Isolation Precautions No active isolations  Medications Medications  0.9 %  sodium chloride infusion (has no administration in time range)  pantoprazole (PROTONIX) injection 40 mg (40 mg Intravenous Given 07/17/19 1231)  iohexol (OMNIPAQUE) 350 MG/ML injection 100 mL (100 mLs Intravenous Contrast Given 07/17/19 1455)     Mobility walks with device High fall risk   Focused Assessments     R Recommendations: See Admitting Provider Note  Report given to:   Additional Notes:

## 2019-07-17 NOTE — ED Triage Notes (Signed)
Dizziness since yesterday.  cbg 215. Stroke scale neg per ems.  Pt a&o x 4.

## 2019-07-17 NOTE — ED Notes (Signed)
Pt and daughter at bedside updated on plan of care, pt requesting something to eat, Marcene Brawn notified, advised to have pt remain NPO, pt and daughter informed,

## 2019-07-17 NOTE — ED Notes (Signed)
RN spoke with Cassandra at [facility] who is pt's legal guardian and advised to fax paperwork.

## 2019-07-17 NOTE — ED Provider Notes (Signed)
[facility] EMERGENCY DEPARTMENT Provider Note   CSN: [id number] Arrival date & time: 07/17/19  0818     History   Chief Complaint Chief Complaint  Patient presents with  . Dizziness    HPI Laurie Mcfarland is a 84 y.o. female.     Pt complains of feeling dizzy.  Pt reports she feels like she is going to black out when she stands up  The history is provided by the patient. No language interpreter was used.  Dizziness Quality:  Lightheadedness Severity:  Moderate Onset quality:  Gradual Duration:  2 days Timing:  Constant Chronicity:  New Context: bending over and standing up   Relieved by:  Nothing Worsened by:  Lying down Ineffective treatments:  None tried Associated symptoms: no blood in stool, no nausea, no shortness of breath, no tinnitus, no vision changes and no vomiting   Risk factors: no anemia     Past Medical History:  Diagnosis Date  . Anxiety   . Arthritis   . Complication of anesthesia    pt. states that she remembers parts of her surgery when she had her TAH  . Diabetes mellitus   . Diabetic peripheral neuropathy ([place])   . Fibromyalgia   . GERD (gastroesophageal reflux disease)   . Hyperlipidemia   . Hypertension   . Shortness of breath    with exertion    Patient Active Problem List   Diagnosis Date Noted  . Bilateral pulmonary embolism ([place]) 02/16/2018  . Diabetic peripheral neuropathy ([place]) 02/16/2018  . Syncope 06/29/2016  . Renal insufficiency 06/29/2016  . Anemia 06/29/2016  . Edema   . Mild dementia ([place]) 03/01/2015  . Gait instability 03/01/2015  . Frequent falls 03/01/2015  . Memory loss 09/03/2014  . Essential hypertension 09/03/2014  . Hyperlipidemia 09/03/2014  . Diabetes type 2, controlled ([place]) 09/03/2014  . Confusion 07/13/2014  . UTI (lower urinary tract infection) 07/13/2014  . Acute encephalopathy 07/13/2014  . Fall at home 07/13/2014  . DM type 2 (diabetes mellitus, type 2) ([place]) 07/13/2014  . Esophageal  dysphagia 06/09/2013  . GERD (gastroesophageal reflux disease) 06/09/2013  . Early satiety 06/09/2013    Past Surgical History:  Procedure Laterality Date  . ABDOMINAL HYSTERECTOMY    . CATARACT EXTRACTION W/PHACO  12/10/2011   Procedure: CATARACT EXTRACTION PHACO AND INTRAOCULAR LENS PLACEMENT (IOC);  Surgeon: Elta Guadeloupe T. Gershon Crane, MD;  Location: [facility];  Service: Ophthalmology;  Laterality: Left;  CDE=10.72  . CATARACT EXTRACTION W/PHACO  01/14/2012   Procedure: CATARACT EXTRACTION PHACO AND INTRAOCULAR LENS PLACEMENT (IOC);  Surgeon: Elta Guadeloupe T. Gershon Crane, MD;  Location: [facility];  Service: Ophthalmology;  Laterality: Right;  CDE:11.63  . ESOPHAGOGASTRODUODENOSCOPY (EGD) WITH ESOPHAGEAL DILATION N/A 06/17/2013   [id number] esophagus-status post Venia Minks dilation. Hiatal hernia. Gastric erosions s/p bx  . foreign body removal     splinter removed from right side of face     OB History   No obstetric history on file.      Home Medications    Prior to Admission medications   Medication Sig Start Date End Date Taking? Authorizing Provider  acetaminophen (TYLENOL) 325 MG tablet Take 2 tablets (650 mg total) by mouth every 6 (six) hours as needed for mild pain or headache (or Fever >/= 101). 02/17/18   Barton Dubois, MD  ALPRAZolam Duanne Moron) 1 MG tablet Take 1 mg by mouth 4 (four) times daily as needed for anxiety. Nerves     [provider]  amLODipine (NORVASC) 10 MG  tablet Take 10 mg by mouth daily.  05/19/13   [provider]  apixaban (ELIQUIS) 5 MG TABS tablet Take 2 tablets (10 mg total) by mouth 2 (two) times daily for 13 doses. 02/17/18 02/24/18  Barton Dubois, MD  apixaban (ELIQUIS) 5 MG TABS tablet Take 1 tablet (5 mg total) by mouth 2 (two) times daily. 02/24/18   Barton Dubois, MD  aspirin EC 81 MG tablet Take 81 mg by mouth every morning.     [provider]  Cholecalciferol (VITAMIN D-3) 1000 UNITS CAPS Take 1 capsule by mouth 2 (two) times daily.     [provider]  CRANBERRY FRUIT PO Take 1 capsule by mouth daily.    [provider]  doxycycline (VIBRAMYCIN) 50 MG capsule Take 50 mg by mouth 2 (two) times daily. 06/21/19   [provider]  gabapentin (NEURONTIN) 400 MG capsule Take 400 mg by mouth 4 (four) times daily.  01/19/18   [provider]  losartan (COZAAR) 25 MG tablet Take 25 mg by mouth daily.    [provider]  memantine (NAMENDA) 5 MG tablet Take 5 mg by mouth 2 (two) times daily.  01/28/18   [provider]  metFORMIN (GLUCOPHAGE) 500 MG tablet Take 500 mg by mouth 2 (two) times daily with a meal.     [provider]  mirtazapine (REMERON) 30 MG tablet Take 30 mg by mouth at bedtime.  01/14/18   [provider]  Multiple Vitamin (MULTIVITAMIN WITH MINERALS) TABS tablet Take 1 tablet by mouth daily.    [provider]  multivitamin-lutein (OCUVITE-LUTEIN) CAPS Take 1 capsule by mouth every morning.     [provider]  omeprazole (PRILOSEC) 40 MG capsule Take 40 mg by mouth daily.    [provider]  oxyCODONE (OXY IR/ROXICODONE) 5 MG immediate release tablet Take 1 tablet by mouth every 4 (four) hours as needed. For pain 06/15/16   [provider]  potassium chloride SA (K-DUR,KLOR-CON) 20 MEQ tablet Take 20 mEq by mouth every morning.     [provider]  primidone (MYSOLINE) 50 MG tablet Take 25 mg by mouth at bedtime. 06/21/19   [provider]  simvastatin (ZOCOR) 40 MG tablet Take 40 mg by mouth daily. 06/06/16   [provider]  traMADol (ULTRAM) 50 MG tablet Take 50 mg by mouth every 4 (four) hours as needed. 06/16/19   [provider]  vitamin C (ASCORBIC ACID) 500 MG tablet Take 500 mg by mouth every morning.     [provider]    Family History Family History  Problem Relation Age of Onset  . Alzheimer's disease Mother   . Cancer Father   . Cancer Sister   . Cancer Brother    . Pseudochol deficiency Neg Hx   . Malignant hyperthermia Neg Hx   . Hypotension Neg Hx   . Anesthesia problems Neg Hx   . Colon cancer Neg Hx     Social History Social History   Tobacco Use  . Smoking status: Former Smoker    Quit date: 08/30/1994    Years since quitting: 24.8  . Smokeless tobacco: Never Used  Substance Use Topics  . Alcohol use: No    Alcohol/week: 0.0 standard drinks  . Drug use: No     Allergies   Penicillins   Review of Systems Review of Systems  HENT: Negative for tinnitus.   Respiratory: Negative for shortness of breath.  Gastrointestinal: Negative for blood in stool, nausea and vomiting.  Neurological: Positive for dizziness.  All other systems reviewed and are negative.    Physical Exam Updated Vital Signs BP (!) 146/51 (BP Location: Right Arm)   Pulse (!) 101   Temp 98.4 F (36.9 C) (Oral)   Resp 20   Ht 5\' 4"  (1.626 m)   Wt 82 kg   SpO2 100%   BMI 31.03 kg/m   Physical Exam Vitals signs and nursing note reviewed.  Constitutional:      Appearance: Normal appearance.  HENT:     Head: Normocephalic.     Right Ear: Tympanic membrane normal.     Left Ear: Tympanic membrane normal.     Nose: Nose normal.     Mouth/Throat:     Mouth: Mucous membranes are moist.  Neck:     Musculoskeletal: Normal range of motion.  Cardiovascular:     Rate and Rhythm: Normal rate.  Pulmonary:     Effort: Pulmonary effort is normal.  Abdominal:     General: Abdomen is flat.  Genitourinary:    Rectum: Guaiac result positive.  Musculoskeletal: Normal range of motion.  Skin:    General: Skin is warm.  Neurological:     General: No focal deficit present.     Mental Status: She is alert.  Psychiatric:        Mood and Affect: Mood normal.      ED Treatments / Results  Labs (all labs ordered are listed, but only abnormal results are displayed) Labs Reviewed  CBC WITH DIFFERENTIAL/PLATELET - Abnormal; Notable for the following  components:      Result Value   WBC 15.3 (*)    RBC 3.00 (*)    Hemoglobin 6.3 (*)    HCT 22.0 (*)    MCV 73.3 (*)    MCH 21.0 (*)    MCHC 28.6 (*)    RDW 20.7 (*)    Platelets 634 (*)    Neutro Abs 13.1 (*)    Abs Immature Granulocytes 0.08 (*)    All other components within normal limits  POC OCCULT BLOOD, ED - Abnormal; Notable for the following components:   Fecal Occult Bld POSITIVE (*)    All other components within normal limits  COMPREHENSIVE METABOLIC PANEL  URINALYSIS, ROUTINE W REFLEX MICROSCOPIC  TROPONIN I (HIGH SENSITIVITY)  TROPONIN I (HIGH SENSITIVITY)    EKG None  Radiology Ct Head Wo Contrast  Result Date: 07/17/2019 CLINICAL DATA:  Weakness EXAM: CT HEAD WITHOUT CONTRAST TECHNIQUE: Contiguous axial images were obtained from the base of the skull through the vertex without intravenous contrast. COMPARISON:  10/19/2018 FINDINGS: Brain: No evidence of acute infarction, hemorrhage, hydrocephalus, extra-axial collection or mass lesion/mass effect. Periventricular white matter hypodensity. Vascular: No hyperdense vessel or unexpected calcification. Skull: Normal. Negative for fracture or focal lesion. Sinuses/Orbits: No acute finding. Other: None. IMPRESSION: No acute intracranial pathology.  Small-vessel white matter disease. Electronically Signed   By: Eddie Candle M.D.   On: 07/17/2019 09:50    Procedures .Critical Care Performed by: Fransico Meadow, PA-C Authorized by: Fransico Meadow, PA-C   Critical care provider statement:    Critical care time (minutes):  45   Critical care start time:  07/17/2019 9:00 AM   Critical care end time:  07/17/2019 11:20 AM   Critical care time was exclusive of:  Separately billable procedures and treating other patients   Critical care was necessary to treat or prevent imminent  or life-threatening deterioration of the following conditions:  Cardiac failure, dehydration and renal failure   Critical care was time spent  personally by me on the following activities:  Blood draw for specimens, development of treatment plan with patient or surrogate, evaluation of patient's response to treatment, examination of patient, re-evaluation of patient's condition, pulse oximetry and ordering and review of radiographic studies   I assumed direction of critical care for this patient from another provider in my specialty: no     (including critical care time)  Medications Ordered in ED Medications - No data to display   Initial Impression / Assessment and Plan / ED Course  I have reviewed the triage vital signs and the nursing notes.  Pertinent labs & imaging results that were available during my care of the patient were reviewed by me and considered in my medical decision making (see chart for details).        MDM:  Pt's hemoglobin is 6.3.  Type and Cross with transfusion ordered.  Pt is hemocult positive on rectal exam.   Hospitalist consulted for admission I spoke to Dr. Laural Golden with GI he advised clear liquid diet.  He will see pt in consult.  Final Clinical Impressions(s) / ED Diagnoses   Final diagnoses:  Anemia, unspecified type  Rectal bleeding    ED Discharge Orders    None       Sidney Ace 07/17/19 1212    Elnora Morrison, MD 07/17/19 1539

## 2019-07-17 NOTE — H&P (Signed)
History and Physical    [name] [id number] DOB: Jul 10, 1935 DOA: 07/17/2019  Referring MD/NP/PA: Dr. Reather Converse PCP: [name], MD  Patient coming from: Home  Chief Complaint: Generalized weakness, dizziness/lightheadedness and feeling easily fatigued.  HPI: Laurie Mcfarland is a 84 y.o. female with a past medical history significant for anxiety/depression, mild dementia, hypertension, hyperlipidemia, type 2 diabetes mellitus with neuropathy, chronic kidney disease stage III 80 secondary to diabetes and fibromyalgia; who presented to the emergency department secondary to generalized weakness, dizziness/lightheadedness sensation and feeling easily fatigued.  Patient expressed intermittently when changing position sensation that she is going to blackout.  Symptoms has been present for the last 2 days and worsening.  She reports no chest pain, no hematuria, no dysuria, no abdominal pain, no frank hematochezia or melena; no fever, no chills no contact with anyone with positive Covid test results.  In the ED CT head demonstrated no acute intracranial abnormality; positive fecal occult blood test, hemoglobin 6.3 and a BUN/creatinine of 36/1.01.  Of note patient reports that she has been taking oral anticoagulation for the last year after she was diagnosed with bilateral pulmonary embolism in summer 2019.  2 large bore IV were placed, patient was type and cross and orders for transfusions provided.  GI service was consulted and [facility] was called to place patient in the hospital for further evaluation and management.  Past Medical/Surgical History: Past Medical History:  Diagnosis Date   Anxiety    Arthritis    Complication of anesthesia    pt. states that she remembers parts of her surgery when she had her TAH   Diabetes mellitus    Diabetic peripheral neuropathy (HCC)    Fibromyalgia    GERD (gastroesophageal reflux disease)    Hyperlipidemia    Hypertension    Shortness of breath     with exertion    Past Surgical History:  Procedure Laterality Date   ABDOMINAL HYSTERECTOMY     CATARACT EXTRACTION W/PHACO  12/10/2011   Procedure: CATARACT EXTRACTION PHACO AND INTRAOCULAR LENS PLACEMENT ([place]);  Surgeon: Elta Guadeloupe T. Gershon Crane, MD;  Location: [facility];  Service: Ophthalmology;  Laterality: Left;  CDE=10.72   CATARACT EXTRACTION W/PHACO  01/14/2012   Procedure: CATARACT EXTRACTION PHACO AND INTRAOCULAR LENS PLACEMENT ([facility]);  Surgeon: Elta Guadeloupe T. Gershon Crane, MD;  Location: [facility];  Service: Ophthalmology;  Laterality: Right;  CDE:11.63   ESOPHAGOGASTRODUODENOSCOPY (EGD) WITH ESOPHAGEAL DILATION N/A 06/17/2013   [id number] esophagus-status post Venia Minks dilation. Hiatal hernia. Gastric erosions s/p bx   foreign body removal     splinter removed from right side of face    Social History:  reports that she quit smoking about 24 years ago. She has never used smokeless tobacco. She reports that she does not drink alcohol or use drugs.  Allergies: Allergies  Allergen Reactions   Penicillins Rash    Has patient had a PCN reaction causing immediate rash, facial/tongue/throat swelling, SOB or lightheadedness with hypotension: No Has patient had a PCN reaction causing severe rash involving mucus membranes or skin necrosis: No Has patient had a PCN reaction that required hospitalization: No Has patient had a PCN reaction occurring within the last 10 years: No If all of the above answers are "NO", then may proceed with Cephalosporin use.     Family History:  Family History  Problem Relation Age of Onset   Alzheimer's disease Mother    Cancer Father    Cancer Sister    Cancer Brother    Pseudochol deficiency  Neg Hx    Malignant hyperthermia Neg Hx    Hypotension Neg Hx    Anesthesia problems Neg Hx    Colon cancer Neg Hx     Prior to Admission medications   Medication Sig Start Date End Date Taking? Authorizing Provider  acetaminophen (TYLENOL) 325 MG tablet Take 2  tablets (650 mg total) by mouth every 6 (six) hours as needed for mild pain or headache (or Fever >/= 101). 02/17/18  Yes Barton Dubois, MD  albuterol (VENTOLIN HFA) 108 (90 Base) MCG/ACT inhaler Inhale 1-2 puffs into the lungs every 4 (four) hours as needed. 06/24/19  Yes [provider]  amLODipine (NORVASC) 10 MG tablet Take 10 mg by mouth daily.  05/19/13  Yes [provider]  apixaban (ELIQUIS) 5 MG TABS tablet Take 1 tablet (5 mg total) by mouth 2 (two) times daily. 02/24/18  Yes Barton Dubois, MD  busPIRone (BUSPAR) 5 MG tablet Take 5 mg by mouth 2 (two) times daily. 06/21/19  Yes [provider]  Cholecalciferol (VITAMIN D-3) 1000 UNITS CAPS Take 1 capsule by mouth 2 (two) times daily.    Yes [provider]  CRANBERRY FRUIT PO Take 1 capsule by mouth daily.   Yes [provider]  gabapentin (NEURONTIN) 600 MG tablet Take 600 mg by mouth 3 (three) times daily. 07/05/19  Yes [provider]  losartan (COZAAR) 25 MG tablet Take 25 mg by mouth daily.   Yes [provider]  memantine (NAMENDA) 5 MG tablet Take 5 mg by mouth 2 (two) times daily.  01/28/18  Yes [provider]  metFORMIN (GLUCOPHAGE) 500 MG tablet Take 500 mg by mouth 2 (two) times daily with a meal.    Yes [provider]  metroNIDAZOLE (METROCREAM) 0.75 % cream Apply 1 application topically 2 (two) times daily as needed. 06/22/19  Yes [provider]  mirtazapine (REMERON) 30 MG tablet Take 30 mg by mouth at bedtime.  01/14/18  Yes [provider]  Multiple Vitamin (MULTIVITAMIN WITH MINERALS) TABS tablet Take 1 tablet by mouth daily.   Yes [provider]  multivitamin-lutein (OCUVITE-LUTEIN) CAPS Take 1 capsule by mouth every morning.    Yes [provider]  omeprazole (PRILOSEC) 40 MG capsule Take 40 mg by mouth daily.   Yes [provider]  oxyCODONE (OXY IR/ROXICODONE) 5 MG immediate release tablet Take 1  tablet by mouth every 4 (four) hours as needed. For pain 06/15/16  Yes [provider]  potassium chloride SA (K-DUR,KLOR-CON) 20 MEQ tablet Take 20 mEq by mouth every morning.    Yes [provider]  primidone (MYSOLINE) 50 MG tablet Take 25 mg by mouth at bedtime. 06/21/19  Yes [provider]  simvastatin (ZOCOR) 40 MG tablet Take 40 mg by mouth daily. 06/06/16  Yes [provider]  traMADol (ULTRAM) 50 MG tablet Take 50 mg by mouth every 4 (four) hours as needed. 06/16/19  Yes [provider]  vitamin C (ASCORBIC ACID) 500 MG tablet Take 500 mg by mouth every morning.    Yes [provider]  zolpidem (AMBIEN) 5 MG tablet Take 5 mg by mouth at bedtime. 06/16/19  Yes [provider]  ALPRAZolam Duanne Moron) 1 MG tablet Take 1 mg by mouth 4 (four) times daily as needed for anxiety. Nerves     [provider]    Review of Systems:  Negative except as otherwise mentioned in HPI.  Physical Exam: Vitals:   07/17/19 1100  07/17/19 1130 07/17/19 1200 07/17/19 1500  BP: (!) 142/52 (!) 144/59 (!) 115/96 (!) 149/60  Pulse: 95 92 (!) 101 (!) 103  Resp: 17 17  16   Temp:    98 F (36.7 C)  TempSrc:    Oral  SpO2: 99% 95% 92% 99%  Weight:      Height:       Constitutional: Pale, expressing feeling weak and easily fatigued; no chest pain, no nausea, no vomiting.   Eyes: PERRL, lids and conjunctivae normal; no icterus, no nystagmus. ENMT: Mucous membranes are moist. Posterior pharynx clear of any exudate or lesions. Neck: normal, supple, no masses, no thyromegaly; no JVD. Respiratory: clear to auscultation bilaterally, no wheezing, no crackles. Normal respiratory effort. No accessory muscle use.  Cardiovascular: Regular rate and rhythm, no murmurs / rubs / gallops. No extremity edema. 2+ pedal pulses. No carotid bruits.  Abdomen: no tenderness, no masses palpated. No hepatosplenomegaly. Bowel sounds positive.  Musculoskeletal: no  clubbing / cyanosis. No joint deformity upper and lower extremities. Good ROM, no contractures. Normal muscle tone.  Skin: no rashes, lesions, ulcers. No induration Neurologic: CN 2-12 grossly intact. Sensation intact, DTR normal. Strength 5/5 in all 4.  Psychiatric: Alert and oriented. Normal mood.    Labs on Admission: I have personally reviewed the following labs and imaging studies  CBC: Recent Labs  Lab 07/17/19 1005  WBC 15.3*  NEUTROABS 13.1*  HGB 6.3*  HCT 22.0*  MCV 73.3*  PLT [id number]*   Basic Metabolic Panel: Recent Labs  Lab 07/17/19 1005  NA 136  K 4.8  CL 101  CO2 23  GLUCOSE 131*  BUN 36*  CREATININE 1.01*  CALCIUM 8.7*   GFR: Estimated Creatinine Clearance: 44.5 mL/min (A) (by C-G formula based on SCr of 1.01 mg/dL (H)).   Liver Function Tests: Recent Labs  Lab 07/17/19 1005  AST 17  ALT 13  ALKPHOS 65  BILITOT 0.5  PROT 6.5  ALBUMIN 2.8*   Urine analysis:    Component Value Date/Time   COLORURINE YELLOW 02/16/2018 1404   APPEARANCEUR CLEAR 02/16/2018 1404   LABSPEC 1.008 02/16/2018 1404   PHURINE 6.0 02/16/2018 1404   GLUCOSEU NEGATIVE 02/16/2018 1404   HGBUR NEGATIVE 02/16/2018 1404   BILIRUBINUR NEGATIVE 02/16/2018 1404   KETONESUR NEGATIVE 02/16/2018 1404   PROTEINUR NEGATIVE 02/16/2018 1404   UROBILINOGEN 0.2 07/13/2014 1021   NITRITE NEGATIVE 02/16/2018 1404   LEUKOCYTESUR NEGATIVE 02/16/2018 1404   Radiological Exams on Admission: Ct Head Wo Contrast  Result Date: 07/17/2019 CLINICAL DATA:  Weakness EXAM: CT HEAD WITHOUT CONTRAST TECHNIQUE: Contiguous axial images were obtained from the base of the skull through the vertex without intravenous contrast. COMPARISON:  10/19/2018 FINDINGS: Brain: No evidence of acute infarction, hemorrhage, hydrocephalus, extra-axial collection or mass lesion/mass effect. Periventricular white matter hypodensity. Vascular: No hyperdense vessel or unexpected calcification. Skull: Normal. Negative for  fracture or focal lesion. Sinuses/Orbits: No acute finding. Other: None. IMPRESSION: No acute intracranial pathology.  Small-vessel white matter disease. Electronically Signed   By: Eddie Candle M.D.   On: 07/17/2019 09:50   Ct Angio Chest Pe W Or Wo Contrast  Result Date: 07/17/2019 CLINICAL DATA:  Weakness and dizziness. Pulmonary embolism diagnosed on 02/16/2018 by chest CTA. EXAM: CT ANGIOGRAPHY CHEST WITH CONTRAST TECHNIQUE: Multidetector CT imaging of the chest was performed using the standard protocol during bolus administration of intravenous contrast. Multiplanar CT image reconstructions and MIPs were obtained to evaluate the vascular anatomy. CONTRAST:  100 cc Omnipaque  350 COMPARISON:  02/16/2018 FINDINGS: Cardiovascular: Normally opacified pulmonary arteries with no pulmonary arterial filling defects seen. No residual pulmonary arterial filling defects are seen. Atheromatous calcifications, including the coronary arteries and aorta. Mediastinum/Nodes: No enlarged mediastinal, hilar, or axillary lymph nodes. Thyroid gland, trachea, and esophagus demonstrate no significant findings. Lungs/Pleura: Stable mild bilateral dependent atelectasis/scarring. No pleural fluid or pneumothorax. Upper Abdomen: Soft tissue stranding adjacent to the distal stomach/duodenal bulb on the last 2 images, not previously present. Stable small liver cysts. Musculoskeletal: Thoracic and lower cervical spine degenerative changes. Review of the MIP images confirms the above findings. IMPRESSION: 1. No pulmonary emboli or acute abnormality. 2. Soft tissue stranding adjacent to the distal stomach/duodenal bulb, not previously present. This could represent changes due to gastritis or duodenitis/ulcer disease. 3. Calcific coronary artery and aortic atherosclerosis. Aortic Atherosclerosis (ICD10-I70.0). Electronically Signed   By: Claudie Revering M.D.   On: 07/17/2019 15:07    EKG: Independently reviewed. Normal sinus rhythm;  normal QT and no signs of acute ischemic changes.  Normal axis appreciated.  Assessment/Plan 1-Acute blood loss anemia -Patient with lightheadedness, intermittent dizziness sensation and feeling weak. -Hemoglobin 6.3 on presentation -Has been taking chronically Eliquis as part of treatment for pulmonary embolism. -No chest pain, no nausea, no vomiting, no abdominal pain, no frank hematochezia, melena or hematemesis. -Patient also denies hematuria. -Fecal occult blood test positive in the ED -Gastroenterology service has been consulted -2 large peripheral IV lines has been requested; type and cross and transfusion will be initiated. -Started on IV PPI and will follow clinical response. -Anticoagulation, NSAIDs and heparin products has been discontinued. -SCDs for DVT prophylaxis. -Follow hemoglobin trend.  2-GERD (gastroesophageal reflux disease) -As mentioned above continue the use of PPI; currently IV.  3-DM type 2 (diabetes mellitus, type 2) (HCC) -Stable -Patient with nephropathy and neuropathy secondary to diabetes. -Hold oral hypoglycemic agents-check A1c -Continue the use of Neurontin.  4-chronic kidney disease stage III a -Appears to be stable and at baseline -Follow renal function trend.  5-HTN (hypertension) -Stable overall -Holding losartan initially -Continue Norvasc.  6-Hyperlipidemia -Continue statins  7-Bilateral pulmonary embolism (HCC) -Per records have been on treatment over 1 year now -Recent lower extremity Dopplers negative for DVT -CT angio has been ordered to assure complete resolution of pulmonary embolism; if negative patient will be taken off completely of anticoagulation therapy at this time.  8-Fibromyalgia/depression and anxiety -Continue home antidepressant and anxiolytic therapy -Continue chronic pain medication.  9-mild dementia -No major behavioral disturbances appreciated-continue the use of Namenda -Continue supportive care.   DVT  prophylaxis: SCDs Code Status: Full code Family Communication: Daughter at bedside. Disposition Plan: Anticipate discharge back home once symptomatic anemia stabilized and work-up for GI bleed completed. Consults called: Gastroenterology service (Dr. Dereck Leep) Admission status: Observation status, length of stay less than 2 midnight; MedSurg bed.   Time Spent: 65 minutes.  Barton Dubois MD [facility] Pager [phone]   07/17/2019, 3:48 PM

## 2019-07-18 ENCOUNTER — Encounter (HOSPITAL_COMMUNITY)
Admission: EM | Disposition: A | Discharge status: Home / Self Care | Payer: Self-pay | Source: Home / Self Care | Attending: Internal Medicine

## 2019-07-18 DIAGNOSIS — M797 Fibromyalgia: Secondary | ICD-10-CM | POA: Diagnosis present

## 2019-07-18 DIAGNOSIS — Z20828 Contact with and (suspected) exposure to other viral communicable diseases: Secondary | ICD-10-CM | POA: Diagnosis present

## 2019-07-18 DIAGNOSIS — F0390 Unspecified dementia without behavioral disturbance: Secondary | ICD-10-CM | POA: Diagnosis present

## 2019-07-18 DIAGNOSIS — E1122 Type 2 diabetes mellitus with diabetic chronic kidney disease: Secondary | ICD-10-CM | POA: Diagnosis present

## 2019-07-18 DIAGNOSIS — E785 Hyperlipidemia, unspecified: Secondary | ICD-10-CM | POA: Diagnosis present

## 2019-07-18 DIAGNOSIS — Z9842 Cataract extraction status, left eye: Secondary | ICD-10-CM | POA: Diagnosis not present

## 2019-07-18 DIAGNOSIS — I10 Essential (primary) hypertension: Secondary | ICD-10-CM | POA: Diagnosis present

## 2019-07-18 DIAGNOSIS — K625 Hemorrhage of anus and rectum: Secondary | ICD-10-CM | POA: Diagnosis present

## 2019-07-18 DIAGNOSIS — Z9181 History of falling: Secondary | ICD-10-CM | POA: Diagnosis not present

## 2019-07-18 DIAGNOSIS — K219 Gastro-esophageal reflux disease without esophagitis: Secondary | ICD-10-CM | POA: Diagnosis not present

## 2019-07-18 DIAGNOSIS — E1142 Type 2 diabetes mellitus with diabetic polyneuropathy: Secondary | ICD-10-CM | POA: Diagnosis present

## 2019-07-18 DIAGNOSIS — K209 Esophagitis, unspecified without bleeding: Secondary | ICD-10-CM | POA: Diagnosis not present

## 2019-07-18 DIAGNOSIS — B9681 Helicobacter pylori [H. pylori] as the cause of diseases classified elsewhere: Secondary | ICD-10-CM | POA: Diagnosis present

## 2019-07-18 DIAGNOSIS — I2699 Other pulmonary embolism without acute cor pulmonale: Secondary | ICD-10-CM | POA: Diagnosis not present

## 2019-07-18 DIAGNOSIS — K921 Melena: Secondary | ICD-10-CM | POA: Diagnosis present

## 2019-07-18 DIAGNOSIS — K279 Peptic ulcer, site unspecified, unspecified as acute or chronic, without hemorrhage or perforation: Secondary | ICD-10-CM | POA: Diagnosis not present

## 2019-07-18 DIAGNOSIS — D62 Acute posthemorrhagic anemia: Secondary | ICD-10-CM | POA: Diagnosis present

## 2019-07-18 DIAGNOSIS — K269 Duodenal ulcer, unspecified as acute or chronic, without hemorrhage or perforation: Secondary | ICD-10-CM | POA: Diagnosis present

## 2019-07-18 DIAGNOSIS — Z961 Presence of intraocular lens: Secondary | ICD-10-CM | POA: Diagnosis present

## 2019-07-18 DIAGNOSIS — K259 Gastric ulcer, unspecified as acute or chronic, without hemorrhage or perforation: Secondary | ICD-10-CM | POA: Diagnosis present

## 2019-07-18 DIAGNOSIS — Z86711 Personal history of pulmonary embolism: Secondary | ICD-10-CM | POA: Diagnosis not present

## 2019-07-18 DIAGNOSIS — N1831 Chronic kidney disease, stage 3a: Secondary | ICD-10-CM | POA: Diagnosis present

## 2019-07-18 DIAGNOSIS — E1143 Type 2 diabetes mellitus with diabetic autonomic (poly)neuropathy: Secondary | ICD-10-CM | POA: Diagnosis not present

## 2019-07-18 DIAGNOSIS — K2100 Gastro-esophageal reflux disease with esophagitis, without bleeding: Secondary | ICD-10-CM | POA: Diagnosis present

## 2019-07-18 DIAGNOSIS — K222 Esophageal obstruction: Secondary | ICD-10-CM | POA: Diagnosis present

## 2019-07-18 DIAGNOSIS — F329 Major depressive disorder, single episode, unspecified: Secondary | ICD-10-CM | POA: Diagnosis present

## 2019-07-18 DIAGNOSIS — F419 Anxiety disorder, unspecified: Secondary | ICD-10-CM | POA: Diagnosis present

## 2019-07-18 DIAGNOSIS — Z90710 Acquired absence of both cervix and uterus: Secondary | ICD-10-CM | POA: Diagnosis not present

## 2019-07-18 DIAGNOSIS — Z9841 Cataract extraction status, right eye: Secondary | ICD-10-CM | POA: Diagnosis not present

## 2019-07-18 DIAGNOSIS — K228 Other specified diseases of esophagus: Secondary | ICD-10-CM | POA: Diagnosis present

## 2019-07-18 HISTORY — PX: ESOPHAGOGASTRODUODENOSCOPY: SHX5428

## 2019-07-18 LAB — TYPE AND SCREEN
ABO/RH(D): B POS
Antibody Screen: NEGATIVE
Unit division: 0
Unit division: 0

## 2019-07-18 LAB — GLUCOSE, CAPILLARY
Glucose-Capillary: 95 mg/dL (ref 70–99)
Glucose-Capillary: 99 mg/dL (ref 70–99)
Glucose-Capillary: 121 mg/dL — ABNORMAL HIGH (ref 70–99)
Glucose-Capillary: 100 mg/dL — ABNORMAL HIGH (ref 70–99)
Glucose-Capillary: 101 mg/dL — ABNORMAL HIGH (ref 70–99)

## 2019-07-18 LAB — BPAM RBC
Blood Product Expiration Date: 202011252359
Blood Product Expiration Date: 202012072359
ISSUE DATE / TIME: 202011141717
ISSUE DATE / TIME: 202011142046
Unit Type and Rh: 7300
Unit Type and Rh: 7300

## 2019-07-18 LAB — BASIC METABOLIC PANEL
Anion gap: 10 (ref 5–15)
BUN: 27 mg/dL — ABNORMAL HIGH (ref 8–23)
CO2: 21 mmol/L — ABNORMAL LOW (ref 22–32)
Calcium: 8.5 mg/dL — ABNORMAL LOW (ref 8.9–10.3)
Chloride: 104 mmol/L (ref 98–111)
Creatinine, Ser: 0.92 mg/dL (ref 0.44–1.00)
GFR calc Af Amer: >60 mL/min (ref >60)
GFR calc non Af Amer: 58 mL/min — ABNORMAL LOW (ref >60)
Glucose, Bld: 103 mg/dL — ABNORMAL HIGH (ref 70–99)
Potassium: 4 mmol/L (ref 3.5–5.1)
Sodium: 135 mmol/L (ref 135–145)

## 2019-07-18 LAB — IRON AND TIBC
Iron: 13 ug/dL — ABNORMAL LOW (ref 28–170)
Saturation Ratios: 4 % — ABNORMAL LOW (ref 10.4–31.8)
TIBC: 370 ug/dL (ref 250–450)
UIBC: 357 ug/dL

## 2019-07-18 LAB — CBC
HCT: 28.4 % — ABNORMAL LOW (ref 36.0–46.0)
Hemoglobin: 8.8 g/dL — ABNORMAL LOW (ref 12.0–15.0)
MCH: 24.1 pg — ABNORMAL LOW (ref 26.0–34.0)
MCHC: 31 g/dL (ref 30.0–36.0)
MCV: 77.8 fL — ABNORMAL LOW (ref 80.0–100.0)
Platelets: 478 10*3/uL — ABNORMAL HIGH (ref 150–400)
RBC: 3.65 MIL/uL — ABNORMAL LOW (ref 3.87–5.11)
RDW: 20 % — ABNORMAL HIGH (ref 11.5–15.5)
WBC: 9.5 10*3/uL (ref 4.0–10.5)
nRBC: 0.2 % (ref 0.0–0.2)

## 2019-07-18 LAB — SARS CORONAVIRUS 2 (TAT 6-24 HRS): SARS Coronavirus 2: NEGATIVE

## 2019-07-18 LAB — FERRITIN: Ferritin: 6 ng/mL — ABNORMAL LOW (ref 11–307)

## 2019-07-18 SURGERY — EGD (ESOPHAGOGASTRODUODENOSCOPY)
Anesthesia: Moderate Sedation

## 2019-07-18 MED ORDER — PANTOPRAZOLE SODIUM 40 MG IV SOLR: 40.0000 mg | Freq: Two times a day (BID) | INTRAVENOUS | Status: DC

## 2019-07-18 MED ORDER — SUCRALFATE 1 GM/10ML PO SUSP
1.0000 g | Freq: Three times a day (TID) | ORAL | Status: DC
  Administered 2019-07-18 – 2019-07-20 (×9): 1 g via ORAL
  Filled 2019-07-18 (×14): qty 10

## 2019-07-18 MED ORDER — MIDAZOLAM HCL 5 MG/5ML IJ SOLN
INTRAMUSCULAR | Status: AC
  Filled 2019-07-18: qty 10

## 2019-07-18 MED ORDER — SODIUM CHLORIDE 0.9 % IV SOLN
INTRAVENOUS | Status: AC | PRN
  Administered 2019-07-18: 300 mL via INTRAMUSCULAR

## 2019-07-18 MED ORDER — SODIUM CHLORIDE 0.9 % IV SOLN
8.0000 mg/h | INTRAVENOUS | Status: DC
  Administered 2019-07-18 (×2): 8 mg/h via INTRAVENOUS
  Filled 2019-07-18 (×6): qty 80

## 2019-07-18 MED ORDER — MEPERIDINE HCL 50 MG/ML IJ SOLN
INTRAMUSCULAR | Status: DC | PRN
  Administered 2019-07-18: 25 mg via INTRAVENOUS
  Administered 2019-07-18: 15 mg via INTRAVENOUS

## 2019-07-18 MED ORDER — MEPERIDINE HCL 50 MG/ML IJ SOLN
INTRAMUSCULAR | Status: AC
  Filled 2019-07-18: qty 1

## 2019-07-18 MED ORDER — PANTOPRAZOLE SODIUM 40 MG IV SOLR
40.0000 mg | Freq: Once | INTRAVENOUS | Status: AC
  Administered 2019-07-18: 40 mg via INTRAVENOUS
  Filled 2019-07-18 (×2): qty 40

## 2019-07-18 MED ORDER — LIDOCAINE VISCOUS HCL 2 % MT SOLN
OROMUCOSAL | Status: DC | PRN
  Administered 2019-07-18: 1 via OROMUCOSAL

## 2019-07-18 MED ORDER — MIDAZOLAM HCL 5 MG/5ML IJ SOLN
INTRAMUSCULAR | Status: DC | PRN
  Administered 2019-07-18: 2 mg via INTRAVENOUS
  Administered 2019-07-18 (×3): 1 mg via INTRAVENOUS

## 2019-07-18 MED ORDER — LIDOCAINE VISCOUS HCL 2 % MT SOLN
OROMUCOSAL | Status: AC
  Filled 2019-07-18: qty 15

## 2019-07-18 NOTE — Progress Notes (Signed)
PROGRESS NOTE    Laurie Mcfarland  [id number] DOB: [birth year] DOA: 07/17/2019 PCP: [name], MD     Brief Narrative:  84 y.o. female with a past medical history significant for anxiety/depression, mild dementia, hypertension, hyperlipidemia, type 2 diabetes mellitus with neuropathy, chronic kidney disease stage III 80 secondary to diabetes and fibromyalgia; who presented to the emergency department secondary to generalized weakness, dizziness/lightheadedness sensation and feeling easily fatigued.  Patient expressed intermittently when changing position sensation that she is going to blackout.  Symptoms has been present for the last 2 days and worsening.  She reports no chest pain, no hematuria, no dysuria, no abdominal pain, no frank hematochezia or melena; no fever, no chills no contact with anyone with positive Covid test results.  In the ED CT head demonstrated no acute intracranial abnormality; positive fecal occult blood test, hemoglobin 6.3 and a BUN/creatinine of 36/1.01.  Of note patient reports that she has been taking oral anticoagulation for the last year after she was diagnosed with bilateral pulmonary embolism in summer 2019.  2 large bore IV were placed, patient was type and cross and orders for transfusions provided.  GI service was consulted and [facility] was called to place patient in the hospital for further evaluation and management.   Assessment & Plan: 1-Acute blood loss anemia -Hemoglobin 8.8 after transfusion -Hemodynamically stable -Endoscopy evaluation demonstrated multiple ulcers in the stomach and duodenum along with grade B esophagitis.  No active bleeding currently but high risk for rebleeding. -Continue IV PPI -Full liquid diet as per GI recommendations -Follow hemoglobin trend. -Continue avoiding NSAIDs and heparin products -No further anticoagulation at discharge.  2-prior history of pulmonary embolism -Patient has completed over 34-month of  therapy -Repeat CT angiogram demonstrated no acute clots -no further anticoagulation needed for that matter.  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 -Tinea sliding scale insulin.  4-hypertension -Continue the use of Norvasc -Blood pressure stable.  5-hyperlipidemia continue statins.  6-fibromyalgia/depression and anxiety -Continue antidepressant and anxiolytic therapy -Continue chronic pain medication -Patient educated about stopping the use of over-the-counter NSAIDs.  7-mild dementia -No major behavioral disturbances appreciated -Continue the use of Namenda -Continue supportive care.  8-chronic kidney disease a stage IIIa -Appears to be stable and at baseline -Continue to follow renal function trend.    DVT prophylaxis: SCDs Code Status: Full code Family Communication: Granddaughter at bedside. Disposition Plan: IV Protonix initiated and will be continue as per GI recommendations; patient will be monitor for the next 24-48 hours for high risk chance of rebleeding.  Follow hemoglobin trend.  Full liquid diet will be allowed.  Consultants:   Gastroenterology service  Procedures:   Endoscopy: Grade B reflux esophagitis; 6 mm deep gastric ulcer at age and without stigmata of bleeding along with 3 smaller ulcers.  Multiple bulbar and post bulbar ulcers without active bleeding.  One of the ulcer is covered with black eschar.    Antimicrobials:  Anti-infectives (From admission, onward)   None       Subjective: No chest pain, no nausea, no vomiting.  Patient reports being hungry and otherwise feeling much better.  Objective: Vitals:   07/18/19 1145 07/18/19 1150 07/18/19 1155 07/18/19 1251  BP: (!) 127/56 129/60 (!) 131/57 (!) 150/67  Pulse: 96 99 98 97  Resp: (!) 24 20 (!) 21   Temp:      TempSrc:      SpO2: 96%  96% 97%   Weight:      Height:         Intake/Output Summary (Last 24 hours) at 07/18/2019 1329 Last data filed at 07/18/2019 0300 Gross per 24 hour  Intake 710 ml  Output --  Net 710 ml   Filed Weights   07/17/19 0821  Weight: 82 kg    Examination: General exam: Alert, awake, oriented, is present to be hungry, denying nausea, vomiting or abdominal pain.  Patient is afebrile.  Feeling much better today.  No overt bleeding appreciated overnight. Respiratory system: Clear to auscultation. Respiratory effort normal. Cardiovascular system:RRR. No murmurs, rubs, gallops. Gastrointestinal system: Abdomen is nondistended, soft and nontender. No organomegaly or masses felt. Normal bowel sounds heard. Central nervous system: Alert and oriented. No focal neurological deficits. Extremities: No cyanosis or clubbing. Skin: No rashes, lesions or ulcers Psychiatry: Judgement and insight appear normal. Mood & affect appropriate.     Data Reviewed: I have personally reviewed following labs and imaging studies  CBC: Recent Labs  Lab 07/17/19 1005 07/18/19 0403  WBC 15.3* 9.5  NEUTROABS 13.1*  --   HGB 6.3* 8.8*  HCT 22.0* 28.4*  MCV 73.3* 77.8*  PLT 634* 123456*   Basic Metabolic Panel: Recent Labs  Lab 07/17/19 1005 07/18/19 0403  NA 136 135  K 4.8 4.0  CL 101 104  CO2 23 21*  GLUCOSE 131* 103*  BUN 36* 27*  CREATININE 1.01* 0.92  CALCIUM 8.7* 8.5*   GFR: Estimated Creatinine Clearance: 48.8 mL/min (by C-G formula based on SCr of 0.92 mg/dL).   Liver Function Tests: Recent Labs  Lab 07/17/19 1005  AST 17  ALT 13  ALKPHOS 65  BILITOT 0.5  PROT 6.5  ALBUMIN 2.8*   HbA1C: Recent Labs    07/17/19 1005  HGBA1C 5.8*   CBG: Recent Labs  Lab 07/17/19 1613 07/17/19 2132 07/18/19 0455 07/18/19 0722 07/18/19 1120  GLUCAP 140* 93 95 99 121*   Anemia Panel: Recent Labs    07/18/19 0920  FERRITIN 6*  TIBC 370  IRON 13*   Urine analysis:    Component Value Date/Time   COLORURINE YELLOW  02/16/2018 1404   APPEARANCEUR CLEAR 02/16/2018 1404   LABSPEC 1.008 02/16/2018 1404   PHURINE 6.0 02/16/2018 1404   GLUCOSEU NEGATIVE 02/16/2018 1404   HGBUR NEGATIVE 02/16/2018 1404   BILIRUBINUR NEGATIVE 02/16/2018 1404   KETONESUR NEGATIVE 02/16/2018 1404   PROTEINUR NEGATIVE 02/16/2018 1404   UROBILINOGEN 0.2 07/13/2014 1021   NITRITE NEGATIVE 02/16/2018 1404   LEUKOCYTESUR NEGATIVE 02/16/2018 1404    Recent Results (from the past 240 hour(s))  SARS CORONAVIRUS 2 (TAT 6-24 HRS) Nasopharyngeal Nasopharyngeal Swab     Status: None   Collection Time: 07/17/19 10:56 AM   Specimen: Nasopharyngeal Swab  Result Value Ref Range Status   SARS Coronavirus 2 NEGATIVE NEGATIVE Final    Comment: (NOTE) SARS-CoV-2 target nucleic acids are NOT DETECTED. The SARS-CoV-2 RNA is generally detectable in upper and lower respiratory specimens during the acute phase of infection. Negative results do not preclude SARS-CoV-2 infection, do not rule out co-infections with other pathogens, and should not be used as the sole basis for treatment or other patient management decisions. Negative results must be combined with clinical observations, patient history, and epidemiological information. The expected result is Negative. Fact Sheet for Patients: SugarRoll.be Fact Sheet for Healthcare Providers: https://www.woods-mathews.com/ This test is not yet approved or cleared by the [place] FDA and  has been authorized for  detection and/or diagnosis of SARS-CoV-2 by FDA under an Emergency Use Authorization (EUA). This EUA will remain  in effect (meaning this test can be used) for the duration of the COVID-19 declaration under Section 56 4(b)(1) of the Act, 21 U.S.C. section 360bbb-3(b)(1), unless the authorization is terminated or revoked sooner. Performed at [facility], [place] [address], [place], [place] [address]      Radiology Studies: Ct Head  Wo Contrast  Result Date: 07/17/2019 CLINICAL DATA:  Weakness EXAM: CT HEAD WITHOUT CONTRAST TECHNIQUE: Contiguous axial images were obtained from the base of the skull through the vertex without intravenous contrast. COMPARISON:  10/19/2018 FINDINGS: Brain: No evidence of acute infarction, hemorrhage, hydrocephalus, extra-axial collection or mass lesion/mass effect. Periventricular white matter hypodensity. Vascular: No hyperdense vessel or unexpected calcification. Skull: Normal. Negative for fracture or focal lesion. Sinuses/Orbits: No acute finding. Other: None. IMPRESSION: No acute intracranial pathology.  Small-vessel white matter disease. Electronically Signed   By: Eddie Candle M.D.   On: 07/17/2019 09:50   Ct Angio Chest Pe W Or Wo Contrast  Result Date: 07/17/2019 CLINICAL DATA:  Weakness and dizziness. Pulmonary embolism diagnosed on 02/16/2018 by chest CTA. EXAM: CT ANGIOGRAPHY CHEST WITH CONTRAST TECHNIQUE: Multidetector CT imaging of the chest was performed using the standard protocol during bolus administration of intravenous contrast. Multiplanar CT image reconstructions and MIPs were obtained to evaluate the vascular anatomy. CONTRAST:  100 cc Omnipaque 350 COMPARISON:  02/16/2018 FINDINGS: Cardiovascular: Normally opacified pulmonary arteries with no pulmonary arterial filling defects seen. No residual pulmonary arterial filling defects are seen. Atheromatous calcifications, including the coronary arteries and aorta. Mediastinum/Nodes: No enlarged mediastinal, hilar, or axillary lymph nodes. Thyroid gland, trachea, and esophagus demonstrate no significant findings. Lungs/Pleura: Stable mild bilateral dependent atelectasis/scarring. No pleural fluid or pneumothorax. Upper Abdomen: Soft tissue stranding adjacent to the distal stomach/duodenal bulb on the last 2 images, not previously present. Stable small liver cysts. Musculoskeletal: Thoracic and lower cervical spine degenerative changes.  Review of the MIP images confirms the above findings. IMPRESSION: 1. No pulmonary emboli or acute abnormality. 2. Soft tissue stranding adjacent to the distal stomach/duodenal bulb, not previously present. This could represent changes due to gastritis or duodenitis/ulcer disease. 3. Calcific coronary artery and aortic atherosclerosis. Aortic Atherosclerosis (ICD10-I70.0). Electronically Signed   By: Claudie Revering M.D.   On: 07/17/2019 15:07    Scheduled Meds:  amLODipine  10 mg Oral Daily   busPIRone  5 mg Oral BID   gabapentin  600 mg Oral TID   insulin aspart  0-5 Units Subcutaneous QHS   insulin aspart  0-9 Units Subcutaneous TID WC   lidocaine       memantine  5 mg Oral BID   meperidine       midazolam       mirtazapine  30 mg Oral QHS   multivitamin-lutein  1 capsule Oral q morning - 10a   potassium chloride SA  20 mEq Oral q morning - 10a   primidone  25 mg Oral QHS   simvastatin  40 mg Oral Daily   sodium chloride flush  3 mL Intravenous Q12H   sucralfate  1 g Oral TID WC & HS   vitamin C  500 mg Oral q morning - 10a   Continuous Infusions:  sodium chloride     pantoprozole (PROTONIX) infusion 8 mg/hr (07/18/19 1230)     LOS: 0 days    Time spent: 30 minutes.   Barton Dubois, MD [facility]  Pager [phone]   07/18/2019, 1:29 PM

## 2019-07-18 NOTE — Consult Note (Addendum)
Referring Provider: Barton Dubois, MD Primary Care Physician:  [name], MD Primary Gastroenterologist:  Dr. Laural Golden  Reason for Consultation:   Melena and anemia.  HPI:   History is obtained from patient. Patient is 84 year old Caucasian female with multiple medical problems including diabetes mellitus peripheral neuropathy GERD history of pulmonary embolism who has been on apixaban who has noted progressive weakness over the last several days.  Yesterday morning she was not able to walk to the bathroom.  She states she fell but was controlled fall.  She did not pass out.  Patient came to emergency room.  Her hemoglobin was noted to be 6.3 g with BUN of 36 and creatinine of 1.01.  Her platelet count was 634 and MCV was 73.3.  Stool was guaiac positive.  While in the emergency room she had unenhanced head CT which was negative for acute findings.  It showed changes of small vessel white matter disease.  She also had CTA chest which was negative for pulmonary emboli but revealed soft tissue stranding adjacent to distal stomach/duodenum not seen on prior study June 2019 and concerning for gastroduodenitis or peptic ulcer disease. Patient was admitted to Dr. Anson Fret service.  She has received 2 units of PRBCs.  Her hemoglobin from this morning was 8.8 g and platelet count 478K. Patient says her heartburn is well controlled with medication.  She has not had any difficulty swallowing since she had her esophagus dilated by Dr. Gala Romney back in October 2014.  She denies abdominal pain nausea vomiting or rectal bleeding.  She states several days ago she noted her stools were black.  She thought was due to the fact that she was drinking energy drink as she had been getting weaker.  Her stools cleared spontaneously.  She has good appetite and her weight has been stable.  Patient states her last colonoscopy was several years ago and she does not remember if any abnormality was found. Patient does not take  aspirin or other OTC NSAIDs. Review of the systems is positive for burning sensation involving left side of face.  She states she woke up with this.  She does not have numbness to her extremities.  She also does not feel weakness in any extremity.  She has no visual impairment in this eye.  She is widowed.  She states she lives alone.  She does her usual household work and she states she is still driving.  She has 3 daughters and 1 son living.  She has never drank alcohol.  She smokes cigarettes for 1 year but quit several years ago.  She is retired from [employer] where she worked for 21 years.  Father had some type of cancer but she does not remember any more details.  She says her mother lived to be in 49s.  1 sister died of what sounds like bone cancer in her 4s.  She has 2 sisters and 1 brother living.  2 brothers are disease but she does not know as to the cause.   Past Medical History:  Diagnosis Date  . Anxiety   . Arthritis   . Complication of anesthesia    pt. states that she remembers parts of her surgery when she had her TAH  . Diabetes mellitus   . Diabetic peripheral neuropathy ([place])   . Fibromyalgia   . GERD (gastroesophageal reflux disease)   . Hyperlipidemia   . Hypertension   . Shortness of breath    with exertion  Past Surgical History:  Procedure Laterality Date  . ABDOMINAL HYSTERECTOMY    . CATARACT EXTRACTION W/PHACO  12/10/2011   Procedure: CATARACT EXTRACTION PHACO AND INTRAOCULAR LENS PLACEMENT (IOC);  Surgeon: Elta Guadeloupe T. Gershon Crane, MD;  Location: [facility];  Service: Ophthalmology;  Laterality: Left;  CDE=10.72  . CATARACT EXTRACTION W/PHACO  01/14/2012   Procedure: CATARACT EXTRACTION PHACO AND INTRAOCULAR LENS PLACEMENT (IOC);  Surgeon: Elta Guadeloupe T. Gershon Crane, MD;  Location: [facility];  Service: Ophthalmology;  Laterality: Right;  CDE:11.63  . ESOPHAGOGASTRODUODENOSCOPY (EGD) WITH ESOPHAGEAL DILATION N/A 06/17/2013   [id number] esophagus-status post Venia Minks  dilation. Hiatal hernia. Gastric erosions s/p bx  . foreign body removal     splinter removed from right side of face    Prior to Admission medications   Medication Sig Start Date End Date Taking? Authorizing Provider  acetaminophen (TYLENOL) 325 MG tablet Take 2 tablets (650 mg total) by mouth every 6 (six) hours as needed for mild pain or headache (or Fever >/= 101). 02/17/18  Yes Barton Dubois, MD  albuterol (VENTOLIN HFA) 108 (90 Base) MCG/ACT inhaler Inhale 1-2 puffs into the lungs every 4 (four) hours as needed. 06/24/19  Yes [provider]  amLODipine (NORVASC) 10 MG tablet Take 10 mg by mouth daily.  05/19/13  Yes [provider]  apixaban (ELIQUIS) 5 MG TABS tablet Take 1 tablet (5 mg total) by mouth 2 (two) times daily. 02/24/18  Yes Barton Dubois, MD  busPIRone (BUSPAR) 5 MG tablet Take 5 mg by mouth 2 (two) times daily. 06/21/19  Yes [provider]  Cholecalciferol (VITAMIN D-3) 1000 UNITS CAPS Take 1 capsule by mouth 2 (two) times daily.    Yes [provider]  CRANBERRY FRUIT PO Take 1 capsule by mouth daily.   Yes [provider]  gabapentin (NEURONTIN) 600 MG tablet Take 600 mg by mouth 3 (three) times daily. 07/05/19  Yes [provider]  losartan (COZAAR) 25 MG tablet Take 25 mg by mouth daily.   Yes [provider]  memantine (NAMENDA) 5 MG tablet Take 5 mg by mouth 2 (two) times daily.  01/28/18  Yes [provider]  metFORMIN (GLUCOPHAGE) 500 MG tablet Take 500 mg by mouth 2 (two) times daily with a meal.    Yes [provider]  metroNIDAZOLE (METROCREAM) 0.75 % cream Apply 1 application topically 2 (two) times daily as needed. 06/22/19  Yes [provider]  mirtazapine (REMERON) 30 MG tablet Take 30 mg by mouth at bedtime.  01/14/18  Yes [provider]  Multiple Vitamin (MULTIVITAMIN WITH MINERALS) TABS tablet Take 1 tablet by mouth daily.   Yes [provider]   multivitamin-lutein (OCUVITE-LUTEIN) CAPS Take 1 capsule by mouth every morning.    Yes [provider]  omeprazole (PRILOSEC) 40 MG capsule Take 40 mg by mouth daily.   Yes [provider]  oxyCODONE (OXY IR/ROXICODONE) 5 MG immediate release tablet Take 1 tablet by mouth every 4 (four) hours as needed. For pain 06/15/16  Yes [provider]  potassium chloride SA (K-DUR,KLOR-CON) 20 MEQ tablet Take 20 mEq by mouth every morning.    Yes [provider]  primidone (MYSOLINE) 50 MG tablet Take 25 mg by mouth at bedtime. 06/21/19  Yes [provider]  simvastatin (ZOCOR) 40 MG tablet Take 40 mg by mouth daily. 06/06/16  Yes [provider]  traMADol (ULTRAM) 50 MG tablet Take 50 mg by mouth every 4 (four) hours as needed. 06/16/19  Yes  [provider]  vitamin C (ASCORBIC ACID) 500 MG tablet Take 500 mg by mouth every morning.    Yes [provider]  zolpidem (AMBIEN) 5 MG tablet Take 5 mg by mouth at bedtime. 06/16/19  Yes [provider]  ALPRAZolam Duanne Moron) 1 MG tablet Take 1 mg by mouth 4 (four) times daily as needed for anxiety. Nerves     [provider]    Current Facility-Administered Medications  Medication Dose Route Frequency Provider Last Rate Last Dose  . 0.9 %  sodium chloride infusion  250 mL Intravenous PRN Barton Dubois, MD      . acetaminophen (TYLENOL) tablet 650 mg  650 mg Oral Q6H PRN Barton Dubois, MD      . albuterol (PROVENTIL) (2.5 MG/3ML) 0.083% nebulizer solution 3 mL  3 mL Inhalation Q4H PRN Barton Dubois, MD      . amLODipine (NORVASC) tablet 10 mg  10 mg Oral Daily Barton Dubois, MD   10 mg at 07/17/19 1620  . busPIRone (BUSPAR) tablet 5 mg  5 mg Oral BID Barton Dubois, MD   5 mg at 07/17/19 2224  . gabapentin (NEURONTIN) capsule 600 mg  600 mg Oral TID Barton Dubois, MD   600 mg at 07/17/19 2224  . insulin aspart (novoLOG) injection 0-5 Units  0-5 Units Subcutaneous QHS  Barton Dubois, MD      . insulin aspart (novoLOG) injection 0-9 Units  0-9 Units Subcutaneous TID WC Barton Dubois, MD   1 Units at 07/17/19 1620  . memantine (NAMENDA) tablet 5 mg  5 mg Oral BID Barton Dubois, MD   5 mg at 07/17/19 2224  . mirtazapine (REMERON) tablet 30 mg  30 mg Oral QHS Barton Dubois, MD   30 mg at 07/17/19 2224  . multivitamin-lutein (OCUVITE-LUTEIN) capsule 1 capsule  1 capsule Oral q morning - 10a Barton Dubois, MD      . ondansetron [facility]) tablet 4 mg  4 mg Oral Q6H PRN Barton Dubois, MD       Or  . ondansetron [facility]) injection 4 mg  4 mg Intravenous Q6H PRN Barton Dubois, MD      . oxyCODONE (Oxy IR/ROXICODONE) immediate release tablet 5 mg  5 mg Oral Q8H PRN Barton Dubois, MD   5 mg at 07/17/19 1620  . pantoprazole (PROTONIX) injection 40 mg  40 mg Intravenous Q24H Barton Dubois, MD   40 mg at 07/17/19 1231  . potassium chloride SA (KLOR-CON) CR tablet 20 mEq  20 mEq Oral q morning - 10a Barton Dubois, MD      . primidone (MYSOLINE) tablet 25 mg  25 mg Oral QHS Barton Dubois, MD   25 mg at 07/17/19 2225  . simvastatin (ZOCOR) tablet 40 mg  40 mg Oral Daily Barton Dubois, MD   40 mg at 07/17/19 1619  . sodium chloride flush (NS) 0.9 % injection 3 mL  3 mL Intravenous Q12H Barton Dubois, MD   3 mL at 07/17/19 2226  . sodium chloride flush (NS) 0.9 % injection 3 mL  3 mL Intravenous PRN Barton Dubois, MD      . traMADol Veatrice Bourbon) tablet 50 mg  50 mg Oral Q8H PRN Barton Dubois, MD      . vitamin C (ASCORBIC ACID) tablet 500 mg  500 mg Oral q morning - 10a Barton Dubois, MD        Allergies as of 07/17/2019 - Review Complete 07/17/2019  Allergen Reaction Noted  . Penicillins Rash  06/09/2013    Family History  Problem Relation Age of Onset  . Alzheimer's disease Mother   . Cancer Father   . Cancer Sister   . Cancer Brother   . Pseudochol deficiency Neg Hx   . Malignant hyperthermia Neg Hx   . Hypotension Neg Hx   . Anesthesia problems Neg Hx    . Colon cancer Neg Hx     Social History   Socioeconomic History  . Marital status: Widowed    Spouse name: Not on file  . Number of children: 4  . Years of education: Not on file  . Highest education level: Not on file  Occupational History  . Not on file  Social Needs  . Financial resource strain: Not hard at all  . Food insecurity    Worry: Never true    Inability: Never true  . Transportation needs    Medical: No    Non-medical: No  Tobacco Use  . Smoking status: Former Smoker    Quit date: 08/30/1994    Years since quitting: 24.8  . Smokeless tobacco: Never Used  Substance and Sexual Activity  . Alcohol use: No    Alcohol/week: 0.0 standard drinks  . Drug use: No  . Sexual activity: Not Currently  Lifestyle  . Physical activity    Days per week: 0 days    Minutes per session: 0 min  . Stress: To some extent  Relationships  . Social connections    Talks on phone: More than three times a week    Gets together: More than three times a week    Attends religious service: More than 4 times per year    Active member of club or organization: Yes    Attends meetings of clubs or organizations: More than 4 times per year    Relationship status: Widowed  . Intimate partner violence    Fear of current or ex partner: No    Emotionally abused: No    Physically abused: No    Forced sexual activity: No  Other Topics Concern  . Not on file  Social History Narrative   Adopted granddaughter and raised great grandson.   Pt states that her grandson lives with her sometimes,     Review of Systems: See HPI, otherwise normal ROS  Physical Exam: Temp:  [98 F (36.7 C)-98.6 F (37 C)] 98.6 F (37 C) (11/15 0445) Pulse Rate:  [82-103] 84 (11/15 0445) Resp:  [16-21] 16 (11/15 0445) BP: (115-149)/(42-96) 139/56 (11/15 0445) SpO2:  [92 %-100 %] 98 % (11/15 0445) Last BM Date: 07/16/19  Patient is alert and in no acute distress. Conjunctiva is pale.  Sclerae  nonicteric. Oropharyngeal mucosa is normal.  She is edentulous in the upper jaw.  She has few teeth and lower jaw. Neck without masses or thyromegaly. Cardiac exam with regular rhythm normal S1 and S2.  She has faint systolic murmur at aortic area. Auscultation lungs reveal vesicular breath sounds bilaterally. Abdomen is symmetrical.  Bowel sounds are normal.  On palpation is soft and nontender with organomegaly or masses. She does not have peripheral edema or clubbing. She does not have facial asymmetry. She has good grip in both hands. She has normal strength in lower extremities as well.   Lab Results: Recent Labs    07/17/19 1005 07/18/19 0403  WBC 15.3* 9.5  HGB 6.3* 8.8*  HCT 22.0* 28.4*  PLT 634* 478*   BMET Recent Labs    07/17/19 1005 07/18/19  0403  NA 136 135  K 4.8 4.0  CL 101 104  CO2 23 21*  GLUCOSE 131* 103*  BUN 36* 27*  CREATININE 1.01* 0.92  CALCIUM 8.7* 8.5*   LFT Recent Labs    07/17/19 1005  PROT 6.5  ALBUMIN 2.8*  AST 17  ALT 13  ALKPHOS 65  BILITOT 0.5   PT/INR No results for input(s): LABPROT, INR in the last 72 hours. Hepatitis Panel No results for input(s): HEPBSAG, HCVAB, HEPAIGM, HEPBIGM in the last 72 hours.  Studies/Results: Ct Head Wo Contrast  Result Date: 07/17/2019 CLINICAL DATA:  Weakness EXAM: CT HEAD WITHOUT CONTRAST TECHNIQUE: Contiguous axial images were obtained from the base of the skull through the vertex without intravenous contrast. COMPARISON:  10/19/2018 FINDINGS: Brain: No evidence of acute infarction, hemorrhage, hydrocephalus, extra-axial collection or mass lesion/mass effect. Periventricular white matter hypodensity. Vascular: No hyperdense vessel or unexpected calcification. Skull: Normal. Negative for fracture or focal lesion. Sinuses/Orbits: No acute finding. Other: None. IMPRESSION: No acute intracranial pathology.  Small-vessel white matter disease. Electronically Signed   By: Eddie Candle M.D.   On:  07/17/2019 09:50   Ct Angio Chest Pe W Or Wo Contrast  Result Date: 07/17/2019 CLINICAL DATA:  Weakness and dizziness. Pulmonary embolism diagnosed on 02/16/2018 by chest CTA. EXAM: CT ANGIOGRAPHY CHEST WITH CONTRAST TECHNIQUE: Multidetector CT imaging of the chest was performed using the standard protocol during bolus administration of intravenous contrast. Multiplanar CT image reconstructions and MIPs were obtained to evaluate the vascular anatomy. CONTRAST:  100 cc Omnipaque 350 COMPARISON:  02/16/2018 FINDINGS: Cardiovascular: Normally opacified pulmonary arteries with no pulmonary arterial filling defects seen. No residual pulmonary arterial filling defects are seen. Atheromatous calcifications, including the coronary arteries and aorta. Mediastinum/Nodes: No enlarged mediastinal, hilar, or axillary lymph nodes. Thyroid gland, trachea, and esophagus demonstrate no significant findings. Lungs/Pleura: Stable mild bilateral dependent atelectasis/scarring. No pleural fluid or pneumothorax. Upper Abdomen: Soft tissue stranding adjacent to the distal stomach/duodenal bulb on the last 2 images, not previously present. Stable small liver cysts. Musculoskeletal: Thoracic and lower cervical spine degenerative changes. Review of the MIP images confirms the above findings. IMPRESSION: 1. No pulmonary emboli or acute abnormality. 2. Soft tissue stranding adjacent to the distal stomach/duodenal bulb, not previously present. This could represent changes due to gastritis or duodenitis/ulcer disease. 3. Calcific coronary artery and aortic atherosclerosis. Aortic Atherosclerosis (ICD10-I70.0). Electronically Signed   By: Claudie Revering M.D.   On: 07/17/2019 15:07    Assessment;  Patient is 84 year old Caucasian female with multiple medical problems including diabetes mellitus history of pulmonary embolism on apixaban who presents with anemia and history of melena.  Patient has received 2 units of PRBCs and her hemoglobin  is up from 6.3 g to 8.8 g and she feels better.  MCV is low and may indicate iron deficiency anemia.  She has history of GERD with satisfactory control of her heartburn with omeprazole.  Patient's BUN was elevated on admission with creatinine at upper limit of normal. Patient presently has no symptoms of dysphagia nausea vomiting abdominal pain or rectal bleeding.  Apixaban is on hold. Differential diagnosis include peptic ulcer disease GI angiodysplasia or occult neoplasm.  Since she gives history of melena it would be appropriate to examine her upper GI tract first before considering colonoscopy or other studies. Thrombocytosis would appear to be due to GI blood loss.  New onset of burning involving left side of face.  She does not have extremity symptoms or weakness.  It remains to be seen if this is due to neuropathy or other conditions.  Further work-up per Dr. Bridgett Larsson.  Recommendations;  Diagnostic esophagogastroduodenoscopy later today. Serum iron TIBC and ferritin on serum from prior to transfusion.  Addendum.  Called to talk with patient's daughter Ms. Loma Messing who will be at the hospital later today.  She is agreeable to proceed with EGD. She told me that her mother vomited multiple times 4 to 5 days ago.  No hematemesis reported.        LOS: 0 days   Britania Shreeve  07/18/2019, 8:25 AM

## 2019-07-18 NOTE — Op Note (Signed)
[facility] Patient Name: Laurie Mcfarland Procedure Date: 07/18/2019 10:29 AM MRN: [id number] Date of Birth: 1935/02/19 Attending MD: Hildred Laser , MD CSN: [id number] Age: 84 Admit Type: Inpatient Procedure:                Upper GI endoscopy Indications:              Melena Providers:                Hildred Laser, MD, Lurline Del, RN, Nelma Rothman,                            Technician Referring MD:             Barton Dubois, MD Medicines:                Lidocaine spray, Meperidine 40 mg IV, Midazolam 4                            mg IV Complications:            No immediate complications. Estimated Blood Loss:     Estimated blood loss: none. Procedure:                Pre-Anesthesia Assessment:                           - Prior to the procedure, a History and Physical                            was performed, and patient medications and                            allergies were reviewed. The patient's tolerance of                            previous anesthesia was also reviewed. The risks                            and benefits of the procedure and the sedation                            options and risks were discussed with the patient.                            All questions were answered, and informed consent                            was obtained. Prior Anticoagulants: The patient                            last took Eliquis (apixaban) 2 days prior to the                            procedure. ASA Grade Assessment: III - A patient  with severe systemic disease. After reviewing the                            risks and benefits, the patient was deemed in                            satisfactory condition to undergo the procedure.                           After obtaining informed consent, the endoscope was                            passed under direct vision. Throughout the                            procedure, the patient's blood pressure, pulse, and                         oxygen saturations were monitored continuously. The                            [id number]) scope was introduced through the                            mouth, and advanced to the second part of duodenum.                            The upper GI endoscopy was accomplished without                            difficulty. The patient tolerated the procedure                            well. Scope In: 11:36:12 AM Scope Out: 11:41:52 AM Total Procedure Duration: 0 hours 5 minutes 40 seconds  Findings:      The proximal esophagus and mid esophagus were normal.      A mild Schatzki ring was found in the distal esophagus.      LA Grade B (one or more mucosal breaks greater than 5 mm, not extending       between the tops of two mucosal folds) esophagitis with no bleeding was       found 37 to 38 cm from the incisors.      The Z-line was irregular and was found 38 cm from the incisors.      Four non-bleeding cratered and superficial gastric ulcers with no       stigmata of bleeding were found in the gastric antrum. The largest       lesion was 6 mm in largest dimension.      The exam of the stomach was otherwise normal.      One non-bleeding cratered duodenal ulcer with no stigmata of bleeding       was found in the duodenal bulb. The lesion was 10 mm in largest       dimension.      Two non-bleeding cratered duodenal ulcers with pigmented material were  found in the duodenal bulb. The largest lesion was 14 mm in largest       dimension.      Four non-bleeding superficial duodenal ulcers with no stigmata of       bleeding were found in the second portion of the duodenum. The largest       lesion was 12 mm in largest dimension. Impression:               - Normal proximal esophagus and mid esophagus.                           - Mild Schatzki ring.                           - LA Grade B esophagitis with no bleeding.                           - Z-line irregular, 38 cm  from the incisors.                           - Non-bleeding gastric ulcers with no stigmata of                            bleeding.                           - Non-bleeding duodenal ulcer with no stigmata of                            bleeding.                           - Non-bleeding duodenal ulcers with pigmented                            material.                           - Non-bleeding duodenal ulcers with no stigmata of                            bleeding.                           - No specimens collected. Moderate Sedation:      Moderate (conscious) sedation was administered by the endoscopy nurse       and supervised by the endoscopist. The following parameters were       monitored: oxygen saturation, heart rate, blood pressure, CO2       capnography and response to care. Total physician intraservice time was       14 minutes. Recommendation:           - Return patient to hospital ward for ongoing care.                           - Full liquid diet today.                           -  Continue present medications.                           - Change Pantoprazole to infusion.                           Sucralfate 1 g po qid.                           - No aspirin, ibuprofen, naproxen, or other                            non-steroidal anti-inflammatory drugs.                           - Hold anti-coagulant for now.                           - H.Pylori serology.                           - If H.Pylori serology is negative will do CTA                            abdomen and pelvis.                           - Repeat EGD in 12 weeks.                           - Procedure Code(s):        --- Professional ---                           [id number], Esophagogastroduodenoscopy, flexible,                            transoral; diagnostic, including collection of                            specimen(s) by brushing or washing, when performed                            (separate procedure)                            G0500, Moderate sedation services provided by the                            same physician or other qualified health care                            professional performing a gastrointestinal                            endoscopic service that sedation supports,  requiring the presence of an independent trained                            observer to assist in the monitoring of the                            patient's level of consciousness and physiological                            status; initial 15 minutes of intra-service time;                            patient age 25 years or older (additional time may                            be reported with [phone], as appropriate) Diagnosis Code(s):        --- Professional ---                           K22.2, Esophageal obstruction                           K20.90, Esophagitis, unspecified without bleeding                           K22.8, Other specified diseases of esophagus                           K25.9, Gastric ulcer, unspecified as acute or                            chronic, without hemorrhage or perforation                           K26.9, Duodenal ulcer, unspecified as acute or                            chronic, without hemorrhage or perforation                           K92.1, Melena (includes Hematochezia) CPT copyright 2019 [employer]. All rights reserved. The codes documented in this report are preliminary and upon coder review may  be revised to meet current compliance requirements. Hildred Laser, MD Hildred Laser, MD 07/18/2019 12:03:39 PM This report has been signed electronically. Number of Addenda: 0

## 2019-07-18 NOTE — Progress Notes (Signed)
Brief EGD note.  Normal mucosa of proximal middle esophagus. Noncritical ring proximal to GE junction. Grade B reflux esophagitis. 6 mm deep gastric ulcer at antrum without stigmata of bleed along with 3 smaller ulcers. Multiple bulbar and post bulbar ulcers without active bleeding.  1 ulcer covered with black eschar.  No bleeding induced on washing.

## 2019-07-19 DIAGNOSIS — K279 Peptic ulcer, site unspecified, unspecified as acute or chronic, without hemorrhage or perforation: Secondary | ICD-10-CM

## 2019-07-19 LAB — CBC
HCT: 28.5 % — ABNORMAL LOW (ref 36.0–46.0)
Hemoglobin: 8.7 g/dL — ABNORMAL LOW (ref 12.0–15.0)
MCH: 24 pg — ABNORMAL LOW (ref 26.0–34.0)
MCHC: 30.5 g/dL (ref 30.0–36.0)
MCV: 78.5 fL — ABNORMAL LOW (ref 80.0–100.0)
Platelets: 468 10*3/uL — ABNORMAL HIGH (ref 150–400)
RBC: 3.63 MIL/uL — ABNORMAL LOW (ref 3.87–5.11)
RDW: 20.7 % — ABNORMAL HIGH (ref 11.5–15.5)
WBC: 10.3 10*3/uL (ref 4.0–10.5)
nRBC: 0 % (ref 0.0–0.2)

## 2019-07-19 LAB — GLUCOSE, CAPILLARY
Glucose-Capillary: 97 mg/dL (ref 70–99)
Glucose-Capillary: 104 mg/dL — ABNORMAL HIGH (ref 70–99)
Glucose-Capillary: 99 mg/dL (ref 70–99)
Glucose-Capillary: 149 mg/dL — ABNORMAL HIGH (ref 70–99)

## 2019-07-19 LAB — H. PYLORI ANTIBODY, IGG: H Pylori IgG: 1.31 Index Value — ABNORMAL HIGH (ref 0.00–0.79)

## 2019-07-19 MED ORDER — PANTOPRAZOLE SODIUM 40 MG PO TBEC
40.0000 mg | DELAYED_RELEASE_TABLET | Freq: Two times a day (BID) | ORAL | Status: DC
  Administered 2019-07-20: 10:00:00 40 mg via ORAL
  Filled 2019-07-19: qty 1

## 2019-07-19 MED ORDER — SODIUM CHLORIDE 0.9 % IV SOLN
8.0000 mg/h | INTRAVENOUS | Status: AC
  Administered 2019-07-19: 17:00:00 8 mg/h via INTRAVENOUS
  Filled 2019-07-19: qty 80

## 2019-07-19 NOTE — Progress Notes (Signed)
PROGRESS NOTE    Laurie Mcfarland  [id number] DOB: April 18, 1935 DOA: 07/17/2019 PCP: [name], MD     Brief Narrative:  84 y.o. female with a past medical history significant for anxiety/depression, mild dementia, hypertension, hyperlipidemia, type 2 diabetes mellitus with neuropathy, chronic kidney disease stage III 80 secondary to diabetes and fibromyalgia; who presented to the emergency department secondary to generalized weakness, dizziness/lightheadedness sensation and feeling easily fatigued.  Patient expressed intermittently when changing position sensation that she is going to blackout.  Symptoms has been present for the last 2 days and worsening.  She reports no chest pain, no hematuria, no dysuria, no abdominal pain, no frank hematochezia or melena; no fever, no chills no contact with anyone with positive Covid test results.  In the ED CT head demonstrated no acute intracranial abnormality; positive fecal occult blood test, hemoglobin 6.3 and a BUN/creatinine of 36/1.01.  Of note patient reports that she has been taking oral anticoagulation for the last year after she was diagnosed with bilateral pulmonary embolism in summer 2019.  2 large bore IV were placed, patient was type and cross and orders for transfusions provided.  GI service was consulted and TR[facility] was called to place patient in the hospital for further evaluation and management.   Assessment & Plan: 1-Acute blood loss anemia -Hemoglobin 8.7 after transfusion -Hemodynamically stable -Endoscopy evaluation demonstrated multiple ulcers in the stomach and duodenum along with grade B esophagitis.  No active bleeding currently but high risk for rebleeding. -Continue IV PPI -Further diet advancement as per GI recommendations. -Continue to follow hemoglobin trend. -Continue avoiding NSAIDs and heparin products -No further anticoagulation at discharge (patient will not require anticoagulation therapy).  2-prior history of  pulmonary embolism -Patient has completed over 80-month of therapy -Repeat CT angiogram demonstrated no acute clots -no further anticoagulation needed for that matter no discharge.  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 -Continue sliding scale insulin.  4-hypertension -Continue the use of Norvasc -Blood pressure stable.  5-hyperlipidemia  -continue statins.  6-fibromyalgia/depression and anxiety -Continue antidepressant and anxiolytic therapy -Continue chronic pain medication -Patient educated about stopping the use of over-the-counter NSAIDs.  7-mild dementia -No major behavioral disturbances appreciated -Continue the use of Namenda -Continue supportive care.  8-chronic kidney disease a stage IIIa -Appears to be stable and at baseline -Continue to follow renal function trend.    DVT prophylaxis: SCDs Code Status: Full code Family Communication: Granddaughter at bedside. Disposition Plan: IV Protonix initiated and will be continue as per GI recommendations; patient will be monitor for the next 24-48 hours for high risk chance of rebleeding.  Follow hemoglobin trend.  Full liquid diet will be allowed.  Consultants:   Gastroenterology service  Procedures:   Endoscopy: Grade B reflux esophagitis; 6 mm deep gastric ulcer at age and without stigmata of bleeding along with 3 smaller ulcers.  Multiple bulbar and post bulbar ulcers without active bleeding.  One of the ulcer is covered with black eschar.    Antimicrobials:  Anti-infectives (From admission, onward)   None       Subjective: Feeling much better, no chest pain, no nausea, no vomiting.  Bowel movement overnight without signs of overt bleeding.  Objective: Vitals:   07/19/19 0452 07/19/19 0918 07/19/19 0920 07/19/19 1320  BP: (!) 135/58 (!) 162/66 (!) 162/66 (!) 143/70  Pulse: 94  (!) 106 99   Resp: 16   17  Temp: 99.1 F (37.3 C)   98.3 F (36.8 C)  TempSrc: Oral   Oral  SpO2: 96%  98% 97%  Weight:      Height:        Intake/Output Summary (Last 24 hours) at 07/19/2019 1402 Last data filed at 07/19/2019 1300 Gross per 24 hour  Intake 924.58 ml  Output -  Net 924.58 ml   Filed Weights   07/17/19 0821  Weight: 82 kg    Examination: General exam: Afebrile, no chest pain, no nausea, no vomiting.  Denies abdominal pain and expressed moving her bowels overnight without any signs of blood in it.  She is hungry and would like her diet advanced. Respiratory system: Clear to auscultation. Respiratory effort normal. Cardiovascular system:RRR. No murmurs, rubs, gallops. Gastrointestinal system: Abdomen is nondistended, soft and nontender. No organomegaly or masses felt. Normal bowel sounds heard. Central nervous system: Alert and oriented. No focal neurological deficits. Extremities: No C/C/E, +pedal pulses Skin: No rashes, lesions or ulcers Psychiatry: Judgement and insight appear normal. Mood & affect appropriate.    Data Reviewed: I have personally reviewed following labs and imaging studies  CBC: Recent Labs  Lab 07/17/19 1005 07/18/19 0403 07/19/19 0613  WBC 15.3* 9.5 10.3  NEUTROABS 13.1*  --   --   HGB 6.3* 8.8* 8.7*  HCT 22.0* 28.4* 28.5*  MCV 73.3* 77.8* 78.5*  PLT 634* 478* [id number]*   Basic Metabolic Panel: Recent Labs  Lab 07/17/19 1005 07/18/19 0403  NA 136 135  K 4.8 4.0  CL 101 104  CO2 23 21*  GLUCOSE 131* 103*  BUN 36* 27*  CREATININE 1.01* 0.92  CALCIUM 8.7* 8.5*   GFR: Estimated Creatinine Clearance: 48.8 mL/min (by C-G formula based on SCr of 0.92 mg/dL).   Liver Function Tests: Recent Labs  Lab 07/17/19 1005  AST 17  ALT 13  ALKPHOS 65  BILITOT 0.5  PROT 6.5  ALBUMIN 2.8*   HbA1C: Recent Labs    07/17/19 1005  HGBA1C 5.8*   CBG: Recent Labs  Lab 07/18/19 0722 07/18/19 1120 07/18/19 1600 07/18/19 2114 07/19/19  0732  GLUCAP 99 121* 100* 101* 97   Anemia Panel: Recent Labs    07/18/19 0920  FERRITIN 6*  TIBC 370  IRON 13*   Urine analysis:    Component Value Date/Time   COLORURINE YELLOW 02/16/2018 1404   APPEARANCEUR CLEAR 02/16/2018 1404   LABSPEC 1.008 02/16/2018 1404   PHURINE 6.0 02/16/2018 1404   GLUCOSEU NEGATIVE 02/16/2018 1404   HGBUR NEGATIVE 02/16/2018 1404   BILIRUBINUR NEGATIVE 02/16/2018 1404   KETONESUR NEGATIVE 02/16/2018 1404   PROTEINUR NEGATIVE 02/16/2018 1404   UROBILINOGEN 0.2 07/13/2014 1021   NITRITE NEGATIVE 02/16/2018 1404   LEUKOCYTESUR NEGATIVE 02/16/2018 1404    Recent Results (from the past 240 hour(s))  SARS CORONAVIRUS 2 (TAT 6-24 HRS) Nasopharyngeal Nasopharyngeal Swab     Status: None   Collection Time: 07/17/19 10:56 AM   Specimen: Nasopharyngeal Swab  Result Value Ref Range Status   SARS Coronavirus 2 NEGATIVE NEGATIVE Final    Comment: (NOTE) SARS-CoV-2 target nucleic acids are NOT DETECTED. The SARS-CoV-2 RNA is generally detectable in upper and lower respiratory specimens during the acute phase of infection. Negative results do not preclude SARS-CoV-2 infection, do not rule out co-infections with other pathogens, and should not be used as the sole basis for treatment or other patient management decisions. Negative results must be combined with clinical observations, patient history, and epidemiological information. The  expected result is Negative. Fact Sheet for Patients: SugarRoll.be Fact Sheet for Healthcare Providers: https://www.woods-mathews.com/ This test is not yet approved or cleared by the [place] FDA and  has been authorized for detection and/or diagnosis of SARS-CoV-2 by FDA under an Emergency Use Authorization (EUA). This EUA will remain  in effect (meaning this test can be used) for the duration of the COVID-19 declaration under Section 56 4(b)(1) of the Act, 21 U.S.C.  section 360bbb-3(b)(1), unless the authorization is terminated or revoked sooner. Performed at [facility], [place] [address], [place], [place] [address]      Radiology Studies: Ct Angio Chest Pe W Or Wo Contrast  Result Date: 07/17/2019 CLINICAL DATA:  Weakness and dizziness. Pulmonary embolism diagnosed on 02/16/2018 by chest CTA. EXAM: CT ANGIOGRAPHY CHEST WITH CONTRAST TECHNIQUE: Multidetector CT imaging of the chest was performed using the standard protocol during bolus administration of intravenous contrast. Multiplanar CT image reconstructions and MIPs were obtained to evaluate the vascular anatomy. CONTRAST:  100 cc Omnipaque 350 COMPARISON:  02/16/2018 FINDINGS: Cardiovascular: Normally opacified pulmonary arteries with no pulmonary arterial filling defects seen. No residual pulmonary arterial filling defects are seen. Atheromatous calcifications, including the coronary arteries and aorta. Mediastinum/Nodes: No enlarged mediastinal, hilar, or axillary lymph nodes. Thyroid gland, trachea, and esophagus demonstrate no significant findings. Lungs/Pleura: Stable mild bilateral dependent atelectasis/scarring. No pleural fluid or pneumothorax. Upper Abdomen: Soft tissue stranding adjacent to the distal stomach/duodenal bulb on the last 2 images, not previously present. Stable small liver cysts. Musculoskeletal: Thoracic and lower cervical spine degenerative changes. Review of the MIP images confirms the above findings. IMPRESSION: 1. No pulmonary emboli or acute abnormality. 2. Soft tissue stranding adjacent to the distal stomach/duodenal bulb, not previously present. This could represent changes due to gastritis or duodenitis/ulcer disease. 3. Calcific coronary artery and aortic atherosclerosis. Aortic Atherosclerosis (ICD10-I70.0). Electronically Signed   By: Claudie Revering M.D.   On: 07/17/2019 15:07    Scheduled Meds: . amLODipine  10 mg Oral Daily  . busPIRone  5 mg Oral BID  . gabapentin   600 mg Oral TID  . insulin aspart  0-5 Units Subcutaneous QHS  . insulin aspart  0-9 Units Subcutaneous TID WC  . memantine  5 mg Oral BID  . mirtazapine  30 mg Oral QHS  . multivitamin-lutein  1 capsule Oral q morning - 10a  . potassium chloride SA  20 mEq Oral q morning - 10a  . primidone  25 mg Oral QHS  . simvastatin  40 mg Oral Daily  . sodium chloride flush  3 mL Intravenous Q12H  . sucralfate  1 g Oral TID WC & HS  . vitamin C  500 mg Oral q morning - 10a   Continuous Infusions: . sodium chloride    . pantoprozole (PROTONIX) infusion 8 mg/hr (07/18/19 2321)     LOS: 1 day    Time spent: 30 minutes.   Barton Dubois, MD [facility] Pager [phone]   07/19/2019, 2:02 PM

## 2019-07-19 NOTE — Progress Notes (Signed)
Subjective: Mild abdominal pain this morning now resolved. No N/V. Tolerating diet. BM yesterday without melena.   Objective: Vital signs in last 24 hours: Temp:  [97.8 F (36.6 C)-99.1 F (37.3 C)] 99.1 F (37.3 C) (11/16 0452) Pulse Rate:  [87-109] 106 (11/16 0920) Resp:  [15-24] 16 (11/16 0452) BP: (123-162)/(56-109) 162/66 (11/16 0920) SpO2:  [92 %-100 %] 98 % (11/16 0920) Last BM Date: 07/18/19 General:   Alert and oriented, pleasant Abdomen:  Bowel sounds present, soft, non-tender, non-distended. No HSM or hernias noted.  Neurologic:  Alert and  oriented x4 Psych:   Normal mood and affect.  Intake/Output from previous day: 11/15 0701 - 11/16 0700 In: 684.6 [P.O.:600; I.V.:84.6] Out: -  Intake/Output this shift: Total I/O In: 240 [P.O.:240] Out: -   Lab Results: Recent Labs    07/17/19 1005 07/18/19 0403 07/19/19 0613  WBC 15.3* 9.5 10.3  HGB 6.3* 8.8* 8.7*  HCT 22.0* 28.4* 28.5*  PLT 634* 478* 468*   BMET Recent Labs    07/17/19 1005 07/18/19 0403  NA 136 135  K 4.8 4.0  CL 101 104  CO2 23 21*  GLUCOSE 131* 103*  BUN 36* 27*  CREATININE 1.01* 0.92  CALCIUM 8.7* 8.5*   LFT Recent Labs    07/17/19 1005  PROT 6.5  ALBUMIN 2.8*  AST 17  ALT 13  ALKPHOS 65  BILITOT 0.5     Studies/Results: Ct Angio Chest Pe W Or Wo Contrast  Result Date: 07/17/2019 CLINICAL DATA:  Weakness and dizziness. Pulmonary embolism diagnosed on 02/16/2018 by chest CTA. EXAM: CT ANGIOGRAPHY CHEST WITH CONTRAST TECHNIQUE: Multidetector CT imaging of the chest was performed using the standard protocol during bolus administration of intravenous contrast. Multiplanar CT image reconstructions and MIPs were obtained to evaluate the vascular anatomy. CONTRAST:  100 cc Omnipaque 350 COMPARISON:  02/16/2018 FINDINGS: Cardiovascular: Normally opacified pulmonary arteries with no pulmonary arterial filling defects seen. No residual pulmonary arterial filling defects are seen.  Atheromatous calcifications, including the coronary arteries and aorta. Mediastinum/Nodes: No enlarged mediastinal, hilar, or axillary lymph nodes. Thyroid gland, trachea, and esophagus demonstrate no significant findings. Lungs/Pleura: Stable mild bilateral dependent atelectasis/scarring. No pleural fluid or pneumothorax. Upper Abdomen: Soft tissue stranding adjacent to the distal stomach/duodenal bulb on the last 2 images, not previously present. Stable small liver cysts. Musculoskeletal: Thoracic and lower cervical spine degenerative changes. Review of the MIP images confirms the above findings. IMPRESSION: 1. No pulmonary emboli or acute abnormality. 2. Soft tissue stranding adjacent to the distal stomach/duodenal bulb, not previously present. This could represent changes due to gastritis or duodenitis/ulcer disease. 3. Calcific coronary artery and aortic atherosclerosis. Aortic Atherosclerosis (ICD10-I70.0). Electronically Signed   By: Claudie Revering M.D.   On: 07/17/2019 15:07    Assessment: 84 year old female presenting with acute blood loss anemia and melena in setting of Eliquis due to non-bleeding gastric ulcers, multiple non-bleeding duodenal ulcers, s/p EGD on 11/15. No bleeding control therapy performed. Reportedly had been taking NSAIDs inadvertently.  +H.pylori serology, and she will need to be treated. Hgb stable s/p 2 units PRBCs this admission. Without further overt GI bleeding. Eliquis remains on hold.   Plan: Continue PPI therapy: infusion started yesterday after EGD.  Advance to soft diet Follow H/H Avoid NSAIDs Will need treatment of H.pylori as outpatient Continue to hold Eliquis for now Will need repeat EGD in 3 months for surveillance  Annitta Needs, PhD, ANP-BC [facility]    LOS: 1 day  07/19/2019, 10:54 AM

## 2019-07-20 ENCOUNTER — Telehealth: Payer: Self-pay | Admitting: Gastroenterology

## 2019-07-20 ENCOUNTER — Encounter: Payer: Self-pay | Admitting: Gastroenterology

## 2019-07-20 DIAGNOSIS — K219 Gastro-esophageal reflux disease without esophagitis: Secondary | ICD-10-CM

## 2019-07-20 DIAGNOSIS — K279 Peptic ulcer, site unspecified, unspecified as acute or chronic, without hemorrhage or perforation: Secondary | ICD-10-CM

## 2019-07-20 LAB — GLUCOSE, CAPILLARY
Glucose-Capillary: 118 mg/dL — ABNORMAL HIGH (ref 70–99)
Glucose-Capillary: 106 mg/dL — ABNORMAL HIGH (ref 70–99)
Glucose-Capillary: 96 mg/dL (ref 70–99)

## 2019-07-20 MED ORDER — OMEPRAZOLE 40 MG PO CPDR: 40.0000 mg | DELAYED_RELEASE_CAPSULE | Freq: Two times a day (BID) | ORAL | 2 refills | Status: AC

## 2019-07-20 NOTE — Discharge Summary (Signed)
Physician Discharge Summary  Laurie Mcfarland [id number] DOB: 03-30-1935 DOA: 07/17/2019  PCP: [name], MD  Admit date: 07/17/2019 Discharge date: 07/20/2019  Time spent: 35 minutes  Recommendations for Outpatient Follow-up:  1. Repeat CBC to follow hemoglobin trend 2. Repeat basic metabolic panel to follow electrolytes and renal function. 3. Please adjust pain medication as required; patient has been using over-the-counter NSAIDs and pain killers to assist with her ongoing chronic pain.   Discharge Diagnoses:  Principal Problem:   Acute blood loss anemia Active Problems:   GERD (gastroesophageal reflux disease)   DM type 2 (diabetes mellitus, type 2) (HCC)   HTN (hypertension)   Hyperlipidemia   Renal insufficiency   Bilateral pulmonary embolism (HCC)   Fibromyalgia   PUD (peptic ulcer disease)   Discharge Condition: Stable and improved.  Patient discharged home with instructions to follow-up with PCP and gastroenterology service as an outpatient.  Diet recommendation: Heart healthy and modify carbohydrates diet.  Filed Weights   07/17/19 0821  Weight: 82 kg    History of present illness:  84 y.o.femalewith a past medical history significant for anxiety/depression, mild dementia, hypertension, hyperlipidemia, type 2 diabetes mellitus with neuropathy, chronic kidney disease stage III 84 secondary to diabetes and fibromyalgia; who presented to the emergency department secondary to generalized weakness, dizziness/lightheadedness sensation and feeling easily fatigued. Patient expressed intermittently when changing position sensationthat she is going to blackout. Symptoms has been present for the last 2 days and worsening. She reports no chest pain, no hematuria, no dysuria, no abdominal pain, no frank hematochezia or melena; no fever, no chills no contact with anyone with positive Covid test results.  In the ED CT head demonstrated no acute intracranial  abnormality; positive fecal occult blood test, hemoglobin 6.3 and a BUN/creatinine of 36/1.01. Of note patient reports that she has been taking oral anticoagulation for the last year after she was diagnosed with bilateral pulmonary embolism in summer 2019. 2 large bore IV were placed, patient was type and cross and orders for transfusions provided. GI service was consulted and TR[facility] was called to place patient in the hospital for further evaluation and management.  Hospital Course:  1-Acute blood loss anemia -Hemoglobin has remained stable after transfusion; hemoglobin at 8.7 at discharge. -Tolerating diet without any signs of overt bleeding currently. -Endoscopy evaluation demonstrated multiple ulcers in the stomach and duodenum along with grade B esophagitis.  No active bleeding currently; given chance for rebleeding patient was kept on IV PPI for 48 hours prior to discharge. -PPI twice a day has been prescribed following GI recommendations. -Outpatient follow-up with GI for repeat endoscopy and final decisions regarding H. pylori treatment. -Continue to follow hemoglobin trend. -Continue avoiding NSAIDs. -No further anticoagulation at discharge (patient will not require anticoagulation therapy).  2-prior history of pulmonary embolism -Patient has completed over 14-month of therapy -Repeat CT angiogram demonstrated no acute clots -no further anticoagulation needed for that matter at discharge.  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 -Continue the use of Neurontin.  4-hypertension -Continue the use of Norvasc -Blood pressure stable.  5-hyperlipidemia  -continue statins.  6-fibromyalgia/depression and anxiety -Continue antidepressant and anxiolytic therapy -Continue chronic pain medication; patient will require adjustment in these regimen for better control of her discomforts.  If needed  will recommend referal to pain clinic management. -Patient educated about stopping the use of over-the-counter NSAIDs.  7-mild dementia -No major behavioral disturbances appreciated -Continue the  use of Namenda -Continue supportive care.  8-chronic kidney disease a stage IIIa -Appears to be stable and at baseline -Continue to follow renal function trend.  Procedures:  See below for x-ray reports  -Endoscopy: Grade B reflux esophagitis; 6 mm deep gastric ulcer at age and without stigmata of bleeding along with 3 smaller ulcers.  Multiple bulbar and post bulbar ulcers without active bleeding.  One of the ulcer is covered with black eschar.    Consultations:  Gastroenterology service  Discharge Exam: Vitals:   07/19/19 2127 07/20/19 0516  BP: (!) 132/48 (!) 119/58  Pulse: (!) 103 89  Resp: 16 16  Temp: 98.6 F (37 C) 98.5 F (36.9 C)  SpO2: 97% 95%    General: Afebrile, no chest pain, no nausea, no vomiting, no abdominal pain.  Tolerating diet without problems and no signs of overt bleeding appreciated. Cardiovascular: S1 and S2, no rubs, no gallops, no JVD on exam. Respiratory: Clear to auscultation bilaterally, no using accessory muscles; good oxygen saturation on room air. Abdomen: Soft, nontender, nondistended, positive bowel sounds, no guarding. Extremities: No edema, no cyanosis, no clubbing.   Discharge Instructions   Discharge Instructions    Diet - low sodium heart healthy   Complete by: As directed    Discharge instructions   Complete by: As directed    Take medications as prescribed Maintain adequate hydration Arrange follow-up with PCP in 10 days Follow-up with gastroenterology service as an outpatient as instructed (office will contact you with appointment details) Avoid the use of NSAIDs Be aware that your anticoagulation medication has been permanently discontinued At this moment     Allergies as of 07/20/2019      Reactions   Penicillins Rash    Has patient had a PCN reaction causing immediate rash, facial/tongue/throat swelling, SOB or lightheadedness with hypotension: No Has patient had a PCN reaction causing severe rash involving mucus membranes or skin necrosis: No Has patient had a PCN reaction that required hospitalization: No Has patient had a PCN reaction occurring within the last 10 years: No If all of the above answers are "NO", then may proceed with Cephalosporin use.      Medication List    STOP taking these medications   apixaban 5 MG Tabs tablet Commonly known as: ELIQUIS     TAKE these medications   acetaminophen 325 MG tablet Commonly known as: TYLENOL Take 2 tablets (650 mg total) by mouth every 6 (six) hours as needed for mild pain or headache (or Fever >/= 101).   albuterol 108 (90 Base) MCG/ACT inhaler Commonly known as: VENTOLIN HFA Inhale 1-2 puffs into the lungs every 4 (four) hours as needed.   ALPRAZolam 1 MG tablet Commonly known as: XANAX Take 1 mg by mouth 4 (four) times daily as needed for anxiety. Nerves   amLODipine 10 MG tablet Commonly known as: NORVASC Take 10 mg by mouth daily.   busPIRone 5 MG tablet Commonly known as: BUSPAR Take 5 mg by mouth 2 (two) times daily.   CRANBERRY FRUIT PO Take 1 capsule by mouth daily.   gabapentin 600 MG tablet Commonly known as: NEURONTIN Take 600 mg by mouth 3 (three) times daily.   losartan 25 MG tablet Commonly known as: COZAAR Take 25 mg by mouth daily.   memantine 5 MG tablet Commonly known as: NAMENDA Take 5 mg by mouth 2 (two) times daily.   metFORMIN 500 MG tablet Commonly known as: GLUCOPHAGE Take 500 mg by mouth 2 (two)  times daily with a meal.   metroNIDAZOLE 0.75 % cream Commonly known as: METROCREAM Apply 1 application topically 2 (two) times daily as needed.   mirtazapine 30 MG tablet Commonly known as: REMERON Take 30 mg by mouth at bedtime.   multivitamin with minerals Tabs tablet Take 1 tablet by mouth  daily.   multivitamin-lutein Caps capsule Take 1 capsule by mouth every morning.   omeprazole 40 MG capsule Commonly known as: PRILOSEC Take 1 capsule (40 mg total) by mouth 2 (two) times daily. What changed: when to take this   oxyCODONE 5 MG immediate release tablet Commonly known as: Oxy IR/ROXICODONE Take 1 tablet by mouth every 4 (four) hours as needed. For pain   potassium chloride SA 20 MEQ tablet Commonly known as: KLOR-CON Take 20 mEq by mouth every morning.   primidone 50 MG tablet Commonly known as: MYSOLINE Take 25 mg by mouth at bedtime.   simvastatin 40 MG tablet Commonly known as: ZOCOR Take 40 mg by mouth daily.   traMADol 50 MG tablet Commonly known as: ULTRAM Take 50 mg by mouth every 4 (four) hours as needed.   vitamin C 500 MG tablet Commonly known as: ASCORBIC ACID Take 500 mg by mouth every morning.   Vitamin D-3 25 MCG (1000 UT) Caps Take 1 capsule by mouth 2 (two) times daily.   zolpidem 5 MG tablet Commonly known as: AMBIEN Take 5 mg by mouth at bedtime.      Allergies  Allergen Reactions  . Penicillins Rash    Has patient had a PCN reaction causing immediate rash, facial/tongue/throat swelling, SOB or lightheadedness with hypotension: No Has patient had a PCN reaction causing severe rash involving mucus membranes or skin necrosis: No Has patient had a PCN reaction that required hospitalization: No Has patient had a PCN reaction occurring within the last 10 years: No If all of the above answers are "NO", then may proceed with Cephalosporin use.    Follow-up Information    [name], MD. Schedule an appointment as soon as possible for a visit in 10 day(s).   Specialty: Internal Medicine Contact information: [address] [place] [phone]           The results of significant diagnostics from this hospitalization (including imaging, microbiology, ancillary and laboratory) are listed below for  reference.    Significant Diagnostic Studies: Ct Head Wo Contrast  Result Date: 07/17/2019 CLINICAL DATA:  Weakness EXAM: CT HEAD WITHOUT CONTRAST TECHNIQUE: Contiguous axial images were obtained from the base of the skull through the vertex without intravenous contrast. COMPARISON:  10/19/2018 FINDINGS: Brain: No evidence of acute infarction, hemorrhage, hydrocephalus, extra-axial collection or mass lesion/mass effect. Periventricular white matter hypodensity. Vascular: No hyperdense vessel or unexpected calcification. Skull: Normal. Negative for fracture or focal lesion. Sinuses/Orbits: No acute finding. Other: None. IMPRESSION: No acute intracranial pathology.  Small-vessel white matter disease. Electronically Signed   By: Eddie Candle M.D.   On: 07/17/2019 09:50   Ct Angio Chest Pe W Or Wo Contrast  Result Date: 07/17/2019 CLINICAL DATA:  Weakness and dizziness. Pulmonary embolism diagnosed on 02/16/2018 by chest CTA. EXAM: CT ANGIOGRAPHY CHEST WITH CONTRAST TECHNIQUE: Multidetector CT imaging of the chest was performed using the standard protocol during bolus administration of intravenous contrast. Multiplanar CT image reconstructions and MIPs were obtained to evaluate the vascular anatomy. CONTRAST:  100 cc Omnipaque 350 COMPARISON:  02/16/2018 FINDINGS: Cardiovascular: Normally opacified pulmonary arteries with no pulmonary arterial filling defects seen. No  residual pulmonary arterial filling defects are seen. Atheromatous calcifications, including the coronary arteries and aorta. Mediastinum/Nodes: No enlarged mediastinal, hilar, or axillary lymph nodes. Thyroid gland, trachea, and esophagus demonstrate no significant findings. Lungs/Pleura: Stable mild bilateral dependent atelectasis/scarring. No pleural fluid or pneumothorax. Upper Abdomen: Soft tissue stranding adjacent to the distal stomach/duodenal bulb on the last 2 images, not previously present. Stable small liver cysts. Musculoskeletal:  Thoracic and lower cervical spine degenerative changes. Review of the MIP images confirms the above findings. IMPRESSION: 1. No pulmonary emboli or acute abnormality. 2. Soft tissue stranding adjacent to the distal stomach/duodenal bulb, not previously present. This could represent changes due to gastritis or duodenitis/ulcer disease. 3. Calcific coronary artery and aortic atherosclerosis. Aortic Atherosclerosis (ICD10-I70.0). Electronically Signed   By: Claudie Revering M.D.   On: 07/17/2019 15:07    Microbiology: Recent Results (from the past 240 hour(s))  SARS CORONAVIRUS 2 (TAT 6-24 HRS) Nasopharyngeal Nasopharyngeal Swab     Status: None   Collection Time: 07/17/19 10:56 AM   Specimen: Nasopharyngeal Swab  Result Value Ref Range Status   SARS Coronavirus 2 NEGATIVE NEGATIVE Final    Comment: (NOTE) SARS-CoV-2 target nucleic acids are NOT DETECTED. The SARS-CoV-2 RNA is generally detectable in upper and lower respiratory specimens during the acute phase of infection. Negative results do not preclude SARS-CoV-2 infection, do not rule out co-infections with other pathogens, and should not be used as the sole basis for treatment or other patient management decisions. Negative results must be combined with clinical observations, patient history, and epidemiological information. The expected result is Negative. Fact Sheet for Patients: SugarRoll.be Fact Sheet for Healthcare Providers: https://www.woods-mathews.com/ This test is not yet approved or cleared by the [place] FDA and  has been authorized for detection and/or diagnosis of SARS-CoV-2 by FDA under an Emergency Use Authorization (EUA). This EUA will remain  in effect (meaning this test can be used) for the duration of the COVID-19 declaration under Section 56 4(b)(1) of the Act, 21 U.S.C. section 360bbb-3(b)(1), unless the authorization is terminated or revoked sooner. Performed at [facility], [place] [address], [place], [place] [address]      Labs: Basic Metabolic Panel: Recent Labs  Lab 07/17/19 1005 07/18/19 0403  NA 136 135  K 4.8 4.0  CL 101 104  CO2 23 21*  GLUCOSE 131* 103*  BUN 36* 27*  CREATININE 1.01* 0.92  CALCIUM 8.7* 8.5*   Liver Function Tests: Recent Labs  Lab 07/17/19 1005  AST 17  ALT 13  ALKPHOS 65  BILITOT 0.5  PROT 6.5  ALBUMIN 2.8*   CBC: Recent Labs  Lab 07/17/19 1005 07/18/19 0403 07/19/19 0613  WBC 15.3* 9.5 10.3  NEUTROABS 13.1*  --   --   HGB 6.3* 8.8* 8.7*  HCT 22.0* 28.4* 28.5*  MCV 73.3* 77.8* 78.5*  PLT 634* 478* 468*   CBG: Recent Labs  Lab 07/19/19 0732 07/19/19 1056 07/19/19 1613 07/19/19 2128 07/20/19 0731  GLUCAP 97 104* 99 149* 118*    Signed:  Barton Dubois MD.  [facility] 07/20/2019, 8:46 AM

## 2019-07-20 NOTE — Telephone Encounter (Signed)
Patient being discharged from hospital today. She was admitted with acute blood loss anemia and melena due to gastric and duodenal ulcers found on EGD. H. Pylori serologies were positive.   She should be prescribed Protonix 40 mg BID upon discharge. Can we verify this? She will need treatment for H. Pylori. Due to penicillin allergy, will treat with clarithromycin 500 mg BID and Flagyl 500 mg TID in addition to her Protonix 40 mg BID x 14 days. After completing the antibiotics, she should continue Protonix 40 mg BID. Laurie Mcfarland, can you let patient know and place orders?  She also needs to repeat CBC in 1 week to ensure stability.   We will need to see her in the office for follow-up in 4 weeks.    Erline Levine, can you arrange hospital follow-up?

## 2019-07-20 NOTE — Plan of Care (Signed)

## 2019-07-20 NOTE — Progress Notes (Signed)
Nsg Discharge Note  Admit Date:  07/17/2019 Discharge date: 07/20/2019   Laurie Mcfarland to be D/C'd home per MD order.  AVS completed.  Copy for chart, and copy for patient signed, and dated. Patient/caregiver able to verbalize understanding.  Discharge Medication: Allergies as of 07/20/2019      Reactions   Penicillins Rash   Has patient had a PCN reaction causing immediate rash, facial/tongue/throat swelling, SOB or lightheadedness with hypotension: No Has patient had a PCN reaction causing severe rash involving mucus membranes or skin necrosis: No Has patient had a PCN reaction that required hospitalization: No Has patient had a PCN reaction occurring within the last 10 years: No If all of the above answers are "NO", then may proceed with Cephalosporin use.      Medication List    STOP taking these medications   apixaban 5 MG Tabs tablet Commonly known as: ELIQUIS     TAKE these medications   acetaminophen 325 MG tablet Commonly known as: TYLENOL Take 2 tablets (650 mg total) by mouth every 6 (six) hours as needed for mild pain or headache (or Fever >/= 101).   albuterol 108 (90 Base) MCG/ACT inhaler Commonly known as: VENTOLIN HFA Inhale 1-2 puffs into the lungs every 4 (four) hours as needed.   ALPRAZolam 1 MG tablet Commonly known as: XANAX Take 1 mg by mouth 4 (four) times daily as needed for anxiety. Nerves   amLODipine 10 MG tablet Commonly known as: NORVASC Take 10 mg by mouth daily.   busPIRone 5 MG tablet Commonly known as: BUSPAR Take 5 mg by mouth 2 (two) times daily.   CRANBERRY FRUIT PO Take 1 capsule by mouth daily.   gabapentin 600 MG tablet Commonly known as: NEURONTIN Take 600 mg by mouth 3 (three) times daily.   losartan 25 MG tablet Commonly known as: COZAAR Take 25 mg by mouth daily.   memantine 5 MG tablet Commonly known as: NAMENDA Take 5 mg by mouth 2 (two) times daily.   metFORMIN 500 MG tablet Commonly known as: GLUCOPHAGE Take  500 mg by mouth 2 (two) times daily with a meal.   metroNIDAZOLE 0.75 % cream Commonly known as: METROCREAM Apply 1 application topically 2 (two) times daily as needed.   mirtazapine 30 MG tablet Commonly known as: REMERON Take 30 mg by mouth at bedtime.   multivitamin with minerals Tabs tablet Take 1 tablet by mouth daily.   multivitamin-lutein Caps capsule Take 1 capsule by mouth every morning.   omeprazole 40 MG capsule Commonly known as: PRILOSEC Take 1 capsule (40 mg total) by mouth 2 (two) times daily. What changed: when to take this   oxyCODONE 5 MG immediate release tablet Commonly known as: Oxy IR/ROXICODONE Take 1 tablet by mouth every 4 (four) hours as needed. For pain   potassium chloride SA 20 MEQ tablet Commonly known as: KLOR-CON Take 20 mEq by mouth every morning.   primidone 50 MG tablet Commonly known as: MYSOLINE Take 25 mg by mouth at bedtime.   simvastatin 40 MG tablet Commonly known as: ZOCOR Take 40 mg by mouth daily.   traMADol 50 MG tablet Commonly known as: ULTRAM Take 50 mg by mouth every 4 (four) hours as needed.   vitamin C 500 MG tablet Commonly known as: ASCORBIC ACID Take 500 mg by mouth every morning.   Vitamin D-3 25 MCG (1000 UT) Caps Take 1 capsule by mouth 2 (two) times daily.   zolpidem 5 MG tablet  Commonly known as: AMBIEN Take 5 mg by mouth at bedtime.       Discharge Assessment: Vitals:   07/19/19 2127 07/20/19 0516  BP: (!) 132/48 (!) 119/58  Pulse: (!) 103 89  Resp: 16 16  Temp: 98.6 F (37 C) 98.5 F (36.9 C)  SpO2: 97% 95%   Skin clean, dry and intact without evidence of skin break down, no evidence of skin tears noted. IV catheter discontinued intact. Site without signs and symptoms of complications - no redness or edema noted at insertion site, patient denies c/o pain - only slight tenderness at site.  Dressing with slight pressure applied.  D/c Instructions-Education: Discharge instructions given to  patient/family with verbalized understanding. D/c education completed with patient/family including follow up instructions, medication list, d/c activities limitations if indicated, with other d/c instructions as indicated by MD - patient able to verbalize understanding, all questions fully answered. Patient instructed to return to ED, call 911, or call MD for any changes in condition.  Patient escorted via [place], and D/C home via private auto.  Venita Sheffield, RN 07/20/2019 1:56 PM

## 2019-07-20 NOTE — Telephone Encounter (Signed)
Patient should also be on daily iron supplementation. Doesn't look like ferrous sulfate is reimbursable through her insurance. She should take OTC iron supplement BID x 3 months then daily.

## 2019-07-20 NOTE — Telephone Encounter (Signed)
PATIENT SCHEDULED  °

## 2019-07-21 ENCOUNTER — Other Ambulatory Visit: Payer: Self-pay

## 2019-07-21 ENCOUNTER — Encounter (HOSPITAL_COMMUNITY): Payer: Self-pay | Admitting: Internal Medicine

## 2019-07-21 DIAGNOSIS — D649 Anemia, unspecified: Secondary | ICD-10-CM

## 2019-07-21 MED ORDER — METRONIDAZOLE 500 MG PO TABS: 500.0000 mg | ORAL_TABLET | Freq: Three times a day (TID) | ORAL | 0 refills | Status: AC

## 2019-07-21 MED ORDER — CLARITHROMYCIN 500 MG PO TABS: 500.0000 mg | ORAL_TABLET | Freq: Two times a day (BID) | ORAL | 0 refills | Status: AC

## 2019-07-21 NOTE — Telephone Encounter (Signed)
Spoke with pt and her grandson. Antibiotics Flagyl bid x 14 days, Clarithromycin 500 mg bid x 14 days were sent to pts pharmacy. Pt is going to start an iron supplement bid x 3 months and then daily. Lab orders were mailed to pt today. An appointment has been made by [facility]. Omeprazole 40 mg bid was sent to pts pharmacy by the ER doctor. I've spoken with [facility] and that will be ok to take bid in the place of Protonix.

## 2019-07-23 DIAGNOSIS — E119 Type 2 diabetes mellitus without complications: Secondary | ICD-10-CM | POA: Diagnosis not present

## 2019-07-23 DIAGNOSIS — G894 Chronic pain syndrome: Secondary | ICD-10-CM | POA: Diagnosis not present

## 2019-07-23 DIAGNOSIS — D649 Anemia, unspecified: Secondary | ICD-10-CM | POA: Diagnosis not present

## 2019-07-23 DIAGNOSIS — I10 Essential (primary) hypertension: Secondary | ICD-10-CM | POA: Diagnosis not present

## 2019-07-23 DIAGNOSIS — L03032 Cellulitis of left toe: Secondary | ICD-10-CM | POA: Diagnosis not present

## 2019-08-18 NOTE — Progress Notes (Signed)
Referring Provider: [name], MD Primary Care Physician:  [name], MD Primary GI Physician: Dr. Gala Romney  Chief Complaint  Patient presents with  . Hospitalization Follow-up    dark stools    HPI:   Laurie Mcfarland is a 84 y.o. female presenting today for hospital follow-up.   Patient was admitted from 07/17/2019-07/20/2019 for acute blood loss anemia and melena with admitting hemoglobin of 6.3 requiring 2 units PRBCs.  EGD on 07/18/2019 with multiple gastric and duodenal ulcers, mild Schatzki ring in the distal esophagus, irregular Z-line, and LA grade B esophagitis.  Recommendations to repeat endoscopy in 3 months. H. pylori serologies were positive. Due to penicillin allergy, plan to treat with clarithromycin 500 mg twice daily, Flagyl 500 mg 3 times daily, and omeprazole 40 mg twice daily x14 days.  After 14 days, she would continue omeprazole twice daily only.  Hemoglobin 8.7 at discharge.  She was also noted to be iron deficient with ferritin 6 (L), iron 13 (L), and percent saturation 4% (L).  She is advised to start OTC iron supplement twice daily x3 months then daily.  Recommended she have CBC repeated in 1 week.   She did not have a 1 week follow-up blood work completed.  Today: She completed the antibiotics. Has continued omeprazole twice a day. Not sure if she is taking it before breakfast and dinner. Has been having dark stools since leaving the hospital. Yesterday stools were a little lighter. Was taking OTC iron as recommended. Reports Dr. Gerarda Fraction prescribed iron because OTC iron was not strong enough. No lightheadedness, dizziness, or feeling like she will pass out.  No bright red blood in the stool. No nausea or vomiting at this time. Had one day of vomiting after the hospital. No hematemesis. No GERD symptoms. No dysphagia.   Appears she has lost about 22 lbs in the last month.  Chronically has little appetite. Doesn't feel like her appetite has changed  significantly. States she is eating about the same. States she eats 3 meals days but small portion sizes.  Oatmeal in the morning, cheese rounds for lunch, and small amount of dinner.  Admits to early satiety. Has been present at least 6 months. Feels it is getting worse. No abdominal pain. BM daily. No constipation or diarrhea. Never had a colonoscopy.   No NSAIDs.    Past Medical History:  Diagnosis Date  . Anxiety   . Arthritis   . Complication of anesthesia    pt. states that she remembers parts of her surgery when she had her TAH  . Diabetes mellitus   . Diabetic peripheral neuropathy ([place])   . Fibromyalgia   . GERD (gastroesophageal reflux disease)   . Hyperlipidemia   . Hypertension   . Shortness of breath    with exertion    Past Surgical History:  Procedure Laterality Date  . ABDOMINAL HYSTERECTOMY    . CATARACT EXTRACTION W/PHACO  12/10/2011   Procedure: CATARACT EXTRACTION PHACO AND INTRAOCULAR LENS PLACEMENT (IOC);  Surgeon: Elta Guadeloupe T. Gershon Crane, MD;  Location: [facility];  Service: Ophthalmology;  Laterality: Left;  CDE=10.72  . CATARACT EXTRACTION W/PHACO  01/14/2012   Procedure: CATARACT EXTRACTION PHACO AND INTRAOCULAR LENS PLACEMENT (IOC);  Surgeon: Elta Guadeloupe T. Gershon Crane, MD;  Location: [facility];  Service: Ophthalmology;  Laterality: Right;  CDE:11.63  . ESOPHAGOGASTRODUODENOSCOPY N/A 07/18/2019   Procedure: ESOPHAGOGASTRODUODENOSCOPY (EGD);  Surgeon: Rogene Houston, MD; multiple gastric and duodenal ulcers, mild Schatzki ring in the distal esophagus, LA  grade B esophagitis, irregular Z-line.  Repeat endoscopy in 3 months.  . ESOPHAGOGASTRODUODENOSCOPY (EGD) WITH ESOPHAGEAL DILATION N/A 06/17/2013   [id number] esophagus-status post Venia Minks dilation. Hiatal hernia. Gastric erosions s/p bx  . foreign body removal     splinter removed from right side of face    Current Outpatient Medications  Medication Sig Dispense Refill  . acetaminophen (TYLENOL) 325 MG tablet Take 2 tablets (650  mg total) by mouth every 6 (six) hours as needed for mild pain or headache (or Fever >/= 101). 60 tablet 0  . albuterol (VENTOLIN HFA) 108 (90 Base) MCG/ACT inhaler Inhale 1-2 puffs into the lungs every 4 (four) hours as needed.    Marland Kitchen amLODipine (NORVASC) 10 MG tablet Take 10 mg by mouth daily.     . busPIRone (BUSPAR) 5 MG tablet Take 5 mg by mouth 2 (two) times daily.    . Cholecalciferol (VITAMIN D-3) 1000 UNITS CAPS Take 1 capsule by mouth 2 (two) times daily.     Marland Kitchen CRANBERRY FRUIT PO Take 1 capsule by mouth daily.    Marland Kitchen gabapentin (NEURONTIN) 600 MG tablet Take 600 mg by mouth 3 (three) times daily.    Marland Kitchen losartan (COZAAR) 25 MG tablet Take 25 mg by mouth daily.    . memantine (NAMENDA) 5 MG tablet Take 5 mg by mouth 2 (two) times daily.     . metFORMIN (GLUCOPHAGE) 500 MG tablet Take 500 mg by mouth 2 (two) times daily with a meal.     . metroNIDAZOLE (METROCREAM) 0.75 % cream Apply 1 application topically 2 (two) times daily as needed.    . mirtazapine (REMERON) 30 MG tablet Take 30 mg by mouth at bedtime.     . Multiple Vitamin (MULTIVITAMIN WITH MINERALS) TABS tablet Take 1 tablet by mouth daily.    . multivitamin-lutein (OCUVITE-LUTEIN) CAPS Take 1 capsule by mouth every morning.     Marland Kitchen omeprazole (PRILOSEC) 40 MG capsule Take 1 capsule (40 mg total) by mouth 2 (two) times daily. 60 capsule 2  . Oxycodone HCl 10 MG TABS Take 10 mg by mouth every 4 (four) hours as needed.    . potassium chloride SA (K-DUR,KLOR-CON) 20 MEQ tablet Take 20 mEq by mouth every morning.     . primidone (MYSOLINE) 50 MG tablet Take 25 mg by mouth at bedtime.    . simvastatin (ZOCOR) 40 MG tablet Take 40 mg by mouth daily.    . traMADol (ULTRAM) 50 MG tablet Take 50 mg by mouth every 4 (four) hours as needed.    . vitamin C (ASCORBIC ACID) 500 MG tablet Take 500 mg by mouth every morning.     . zolpidem (AMBIEN) 5 MG tablet Take 5 mg by mouth at bedtime.     No current facility-administered medications for this  visit.    Allergies as of 08/19/2019 - Review Complete 08/19/2019  Allergen Reaction Noted  . Penicillins Rash 06/09/2013    Family History  Problem Relation Age of Onset  . Alzheimer's disease Mother   . Cancer Father   . Cancer Sister   . Cancer Brother   . Pseudochol deficiency Neg Hx   . Malignant hyperthermia Neg Hx   . Hypotension Neg Hx   . Anesthesia problems Neg Hx   . Colon cancer Neg Hx     Social History   Socioeconomic History  . Marital status: Widowed    Spouse name: Not on file  . Number of children: 4  .  Years of education: Not on file  . Highest education level: Not on file  Occupational History  . Not on file  Tobacco Use  . Smoking status: Former Smoker    Quit date: 08/30/1994    Years since quitting: 24.9  . Smokeless tobacco: Never Used  Substance and Sexual Activity  . Alcohol use: No    Alcohol/week: 0.0 standard drinks  . Drug use: No  . Sexual activity: Not Currently  Other Topics Concern  . Not on file  Social History Narrative   Adopted granddaughter and raised great grandson.   Pt states that her grandson lives with her sometimes,    Social Determinants of Health   Financial Resource Strain: Low Risk   . Difficulty of Paying Living Expenses: Not hard at all  Food Insecurity: No Food Insecurity  . Worried About Charity fundraiser in the Last Year: Never true  . Ran Out of Food in the Last Year: Never true  Transportation Needs: No Transportation Needs  . Lack of Transportation (Medical): No  . Lack of Transportation (Non-Medical): No  Physical Activity: Inactive  . Days of Exercise per Week: 0 days  . Minutes of Exercise per Session: 0 min  Stress: Stress Concern Present  . Feeling of Stress : To some extent  Social Connections: Slightly Isolated  . Frequency of Communication with Friends and Family: More than three times a week  . Frequency of Social Gatherings with Friends and Family: More than three times a week  .  Attends Religious Services: More than 4 times per year  . Active Member of Clubs or Organizations: Yes  . Attends Archivist Meetings: More than 4 times per year  . Marital Status: Widowed    Review of Systems: Gen: Denies fever, chills, lightheadedness, dizziness, presyncope, or syncope. CV: Denies chest pain or palpitations Resp: Denies shortness of breath or cough.  GI: See HPI Derm: Denies rash Psych: Admits to depression and anxiety. Medications help.  Heme: Denies bruising, bleeding  Physical Exam: BP 136/75   Pulse (!) 106   Temp (!) 97 F (36.1 C) (Temporal)   Ht 5\' 3"  (1.6 m)   Wt 158 lb 6.4 oz (71.8 kg)   BMI 28.06 kg/m  General:   Alert and oriented. No distress noted. Pleasant and cooperative.  Head:  Normocephalic and atraumatic. Eyes:  Conjuctiva clear without scleral icterus. Heart:  S1, S2 present without murmurs appreciated. Lungs:  Clear to auscultation bilaterally. No wheezes, rales, or rhonchi. No distress.  Abdomen:  +BS, soft, non-tender and non-distended. No rebound or guarding. No HSM or masses noted. Msk:  Symmetrical without gross deformities. Normal posture. Extremities:  Without 1+ bilateral lower extremity pitting edema Neurologic:  Alert and  oriented x4 Psych:  Normal mood and affect.

## 2019-08-19 ENCOUNTER — Other Ambulatory Visit: Payer: Self-pay

## 2019-08-19 ENCOUNTER — Encounter: Payer: Self-pay | Admitting: Gastroenterology

## 2019-08-19 ENCOUNTER — Encounter: Payer: Self-pay | Admitting: *Deleted

## 2019-08-19 ENCOUNTER — Ambulatory Visit: Payer: Medicare Other | Admitting: Gastroenterology

## 2019-08-19 ENCOUNTER — Other Ambulatory Visit: Payer: Self-pay | Admitting: *Deleted

## 2019-08-19 VITALS — BP 136/75 | HR 106 | Temp 97.0°F | Ht 63.0 in | Wt 158.4 lb

## 2019-08-19 DIAGNOSIS — D649 Anemia, unspecified: Secondary | ICD-10-CM | POA: Diagnosis not present

## 2019-08-19 DIAGNOSIS — K269 Duodenal ulcer, unspecified as acute or chronic, without hemorrhage or perforation: Secondary | ICD-10-CM

## 2019-08-19 DIAGNOSIS — K2981 Duodenitis with bleeding: Secondary | ICD-10-CM

## 2019-08-19 DIAGNOSIS — Z8619 Personal history of other infectious and parasitic diseases: Secondary | ICD-10-CM

## 2019-08-19 DIAGNOSIS — K259 Gastric ulcer, unspecified as acute or chronic, without hemorrhage or perforation: Secondary | ICD-10-CM | POA: Diagnosis not present

## 2019-08-19 DIAGNOSIS — R634 Abnormal weight loss: Secondary | ICD-10-CM | POA: Insufficient documentation

## 2019-08-19 NOTE — Assessment & Plan Note (Addendum)
84 year old female presenting for hospital follow-up of acute blood loss anemia with melena that was found to be secondary to multiple gastric ulcers and duodenal ulcers identified on EGD on 07/18/2019. Other findings included mild Schatzki's ring in the distal esophagus, irregular Z-line, and LA grade B esophagitis.  Patient required 2 units PRBCs while inpatient.  Discharge hemoglobin on 07/20/2019 was 8.7.  She has not completed any blood work since discharge. H. pylori serologies were positive and she was treated with clarithromycin 500 mg twice daily, Flagyl 500 mg 3 times daily, and omeprazole 40 mg twice daily upon discharge.  She has continued on omeprazole twice daily.  She reports ongoing black stools since discharge but she had been taking over-the-counter iron supplement due to iron deficiency.  Also notes that the stools are not as dark as they were when she presented to the hospital.  Recently, there has been some mixup with her iron.  Reports PCP had her stop the OTC iron and sent in a prescription but clinical staff cannot verify this with patient's pharmacy. Otherwise, no significant upper GI symptoms.  Denies bright red blood per rectum. It does appear she has lost about 22 pounds in the last month unintentionally although with 24-hour diet recall, does not sound like she is eating enough calories on a regular basis.  States she chronically has decreased appetite.  However, reports early satiety is getting worse.  CT angio on file from 07/17/2019 without acute abnormality. No recent abdominal imaging.   Suspect gastric and duodenal ulcers were likely secondary to H. pylori.  Dark stools likely secondary to oral iron.  We will need to update CBC and iron panel to ensure hemoglobin and iron deficiency is improving. Patient needs repeat upper endoscopy in February 2021 (3 months from last EGD) to follow-up on ulcer healing and H. pylori eradication.  Proceed with EGD with propofol with Dr. Gala Romney  in the near future. The risks, benefits, and alternatives have been discussed in detail with patient. They have stated understanding and desire to proceed.  Update CBC and iron panel. Continue omeprazole twice daily 30 minutes before breakfast and dinner. Avoid all NSAIDs. Follow-up with PCP regarding iron prescription. CT abdomen and pelvis to evaluate for significant weight loss.  BMP to evaluate kidney function.  Instructions written on CT order for radiology to adjust contrast as needed based off kidney function. Follow-up after endoscopy.

## 2019-08-19 NOTE — Assessment & Plan Note (Signed)
Addressed under multiple gastric ulcers.

## 2019-08-19 NOTE — Assessment & Plan Note (Addendum)
Patient hospitalized from 07/17/2019-07/20/2019 for acute blood loss anemia and melena.  EGD on 07/18/2019 with multiple gastric ulcers and duodenal ulcers, mild Schatzki's ring in the distal esophagus, irregular Z-line, and LA grade B esophagitis.  H. pylori serologies were positive and likely the cause of multiple gastric and duodenal ulcers.  She was treated with clarithromycin 500 mg twice daily, Flagyl 500 mg 3 times daily, omeprazole 40 mg twice daily x14 days.  She is continued on omeprazole twice daily.  Hemoglobin at discharge was 8.7.  She is also noted to be iron deficient with ferritin 6, iron 13, percent saturation 4%.  She was started on oral iron.  She has not completed follow-up blood work.  Recent mixup with iron as her PCP advised she stop oral iron and apparently sent in a prescription although we cannot verify this with patient's pharmacy.  Admits to dark stools since discharge, likely secondary to oral iron.  Otherwise, denies any significant upper GI symptoms or lower GI symptoms.  She is due for repeat EGD in February 2021.  Proceed with EGD with propofol with Dr. Gala Romney in the near future.The risks, benefits, and alternatives have been discussed in detail with patient. They have stated understanding and desire to proceed.  CBC and iron panel with ferritin. Continue omeprazole 40 mg twice daily 30 minutes before breakfast and dinner. Avoid all NSAIDs. Follow-up with PCP regarding iron prescription. Follow-up after EGD.

## 2019-08-19 NOTE — Patient Instructions (Signed)
We will get you scheduled for repeat upper endoscopy in the near future with Dr. Gala Romney to follow-up on the ulcers you had in your stomach and small intestine.  Please have lab work completed.  Continue taking omeprazole 40 mg twice a day.  Take this 30 minutes before breakfast and 30 minutes before dinner.  Call Dr. Gerarda Fraction and follow-up on the prescription for iron.    Please have CT of your abdomen and pelvis completed.  Continue to avoid all NSAIDs.  This includes ibuprofen, Advil, Aleve, and Goody powders.  We will plan to follow-up with you in the office after your procedure.  Aliene Altes, PA-C [facility]

## 2019-08-19 NOTE — Assessment & Plan Note (Addendum)
Appears patient has had unintentional weight loss of 22 pounds in the last month.  Admits to chronically decreased appetite and does not feel this has changed significantly.  On 24-hour diet recall, does not sound like she eats enough calories on a regular basis.  Admits to early satiety x6 months that she feels is getting worse.  Denies other significant upper or lower GI symptoms.  Stools have been dark, but she has been on oral iron.  EGD on file from 07/18/2019 with multiple gastric ulcers and duodenal ulcers, mild Schatzki ring in the distal esophagus, irregular Z-line, and LA grade B esophagitis.  H. pylori serologies positive and likely the cause of gastric ulcers.  She has been treated for H. pylori.  She is due for repeat EGD in February 2021.  She has never had a colonoscopy.  CT angio on file from 07/17/2019 without significant findings.  No recent abdominal imaging on file.  Due to significant weight loss in the last month, will pursue CT abdomen and pelvis with contrast to evaluate further.  Instructions have been written on CT order for radiology to adjust contrast as needed based on kidney function.  Repeat EGD will help evaluate upper GI tract and early satiety.  Follow-up after endoscopy.

## 2019-08-20 LAB — CBC WITH DIFFERENTIAL/PLATELET
Basophils Absolute: 0 10*3/uL (ref 0.0–0.2)
Basos: 0 %
EOS (ABSOLUTE): 0 10*3/uL (ref 0.0–0.4)
Eos: 0 %
Hematocrit: 33.7 % — ABNORMAL LOW (ref 34.0–46.6)
Hemoglobin: 10.4 g/dL — ABNORMAL LOW (ref 11.1–15.9)
Immature Grans (Abs): 0 10*3/uL (ref 0.0–0.1)
Immature Granulocytes: 0 %
Lymphocytes Absolute: 1.3 10*3/uL (ref 0.7–3.1)
Lymphs: 16 %
MCH: 23.9 pg — ABNORMAL LOW (ref 26.6–33.0)
MCHC: 30.9 g/dL — ABNORMAL LOW (ref 31.5–35.7)
MCV: 77 fL — ABNORMAL LOW (ref 79–97)
Monocytes Absolute: 0.6 10*3/uL (ref 0.1–0.9)
Monocytes: 8 %
Neutrophils Absolute: 5.9 10*3/uL (ref 1.4–7.0)
Neutrophils: 76 %
Platelets: 292 10*3/uL (ref 150–450)
RBC: 4.36 x10E6/uL (ref 3.77–5.28)
RDW: 20 % — ABNORMAL HIGH (ref 11.7–15.4)
WBC: 7.7 10*3/uL (ref 3.4–10.8)

## 2019-08-20 LAB — IRON,TIBC AND FERRITIN PANEL
Ferritin: 18 ng/mL (ref 15–150)
Iron Saturation: 24 % (ref 15–55)
Iron: 86 ug/dL (ref 27–139)
Total Iron Binding Capacity: 360 ug/dL (ref 250–450)
UIBC: 274 ug/dL (ref 118–369)

## 2019-08-20 LAB — BASIC METABOLIC PANEL
BUN/Creatinine Ratio: 15 (ref 12–28)
BUN: 14 mg/dL (ref 8–27)
CO2: 24 mmol/L (ref 20–29)
Calcium: 9.8 mg/dL (ref 8.7–10.3)
Chloride: 98 mmol/L (ref 96–106)
Creatinine, Ser: 0.95 mg/dL (ref 0.57–1.00)
GFR calc Af Amer: 65 mL/min/{1.73_m2} (ref >59)
GFR calc non Af Amer: 56 mL/min/{1.73_m2} — ABNORMAL LOW (ref >59)
Glucose: 113 mg/dL — ABNORMAL HIGH (ref 65–99)
Potassium: 4 mmol/L (ref 3.5–5.2)
Sodium: 140 mmol/L (ref 134–144)

## 2019-08-20 NOTE — Progress Notes (Signed)
Noted. Ferritin has resulted. Now within normal limits. Improved from 6 one month ago to 18. This is still on the very low end of normal and she should continue on iron for now. She should follow-up with Dr. Gerarda Fraction on the prescription he wrote for her as we discussed at [place]. If she doesn't get the prescription, she should at least continue the OTC iron she was taking BID to complete 3 months of therapy then once daily.

## 2019-08-20 NOTE — Progress Notes (Signed)
Hemoglobin improved from 8.7 at the time of hospital discharge 1 month ago to 10.4. This is still low but should continue to improve as she is without evidence of ongoing GI bleeding. Suspect her dark stools are related to the iron she was taking as discussed at [place]. Her iron levels seem to be improving. Ferritin hasn't resulted yet. Electrolytes within normal limits. Kidney function stable.   Continue omeprazole 40 mg BID and proceed with EGD scheduled in March.

## 2019-09-01 DIAGNOSIS — M7122 Synovial cyst of popliteal space [Baker], left knee: Secondary | ICD-10-CM | POA: Diagnosis not present

## 2019-09-01 DIAGNOSIS — R600 Localized edema: Secondary | ICD-10-CM | POA: Diagnosis not present

## 2019-09-08 ENCOUNTER — Ambulatory Visit (HOSPITAL_COMMUNITY): Payer: Medicare Other

## 2019-09-16 ENCOUNTER — Other Ambulatory Visit: Payer: Self-pay

## 2019-09-16 ENCOUNTER — Encounter: Payer: Self-pay | Admitting: Orthopaedic Surgery

## 2019-09-16 ENCOUNTER — Ambulatory Visit (INDEPENDENT_AMBULATORY_CARE_PROVIDER_SITE_OTHER): Payer: Medicare Other | Admitting: Orthopaedic Surgery

## 2019-09-16 ENCOUNTER — Ambulatory Visit: Payer: Medicare Other

## 2019-09-16 VITALS — BP 136/68 | HR 82 | Temp 96.7°F | Ht 63.0 in | Wt 167.0 lb

## 2019-09-16 DIAGNOSIS — M25562 Pain in left knee: Secondary | ICD-10-CM

## 2019-09-16 DIAGNOSIS — G8929 Other chronic pain: Secondary | ICD-10-CM

## 2019-09-16 NOTE — Patient Instructions (Signed)
One aleve twice daily after you eat

## 2019-09-16 NOTE — Progress Notes (Signed)
Subjective:    Patient ID: Laurie Mcfarland, female    DOB: [birth year], 85 y.o.   MRN: [id number]  HPI She has pain of the left knee, present for some time. She has noticed a bow leg appearance of the knee. She has swelling and popping but no giving way. She has tried Advil with some slight help. She has seen Dr. Gerarda Fraction. I have copies of his notes and have reviewed them. She has no trauma, no redness, no numbness.     Review of Systems  Constitutional: Positive for activity change.  Musculoskeletal: Positive for gait problem and joint swelling.  Psychiatric/Behavioral: The patient is nervous/anxious.   All other systems reviewed and are negative.  For Review of Systems, all other systems reviewed and are negative.  The following is a summary of the past history medically, past history surgically, known current medicines, social history and family history.  This information is gathered electronically by the computer from prior information and documentation.  I review this each visit and have found including this information at this point in the chart is beneficial and informative.   Past Medical History:  Diagnosis Date  . Anxiety   . Arthritis   . Cataracts, bilateral   . Complication of anesthesia    pt. states that she remembers parts of her surgery when she had her TAH  . Depression   . Diabetes mellitus   . Diabetic peripheral neuropathy ([place])   . Fibromyalgia   . GERD (gastroesophageal reflux disease)   . Heart burn   . High cholesterol   . Hyperlipidemia   . Hypertension   . Leg swelling   . Shortness of breath    with exertion  . Weight loss   . Wheezing     Past Surgical History:  Procedure Laterality Date  . ABDOMINAL HYSTERECTOMY    . CATARACT EXTRACTION W/PHACO  12/10/2011   Procedure: CATARACT EXTRACTION PHACO AND INTRAOCULAR LENS PLACEMENT (IOC);  Surgeon: Elta Guadeloupe T. Gershon Crane, MD;  Location: [facility];  Service: Ophthalmology;  Laterality: Left;  CDE=10.72  . CATARACT  EXTRACTION W/PHACO  01/14/2012   Procedure: CATARACT EXTRACTION PHACO AND INTRAOCULAR LENS PLACEMENT (IOC);  Surgeon: Elta Guadeloupe T. Gershon Crane, MD;  Location: [facility];  Service: Ophthalmology;  Laterality: Right;  CDE:11.63  . ESOPHAGOGASTRODUODENOSCOPY N/A 07/18/2019   Procedure: ESOPHAGOGASTRODUODENOSCOPY (EGD);  Surgeon: Rogene Houston, MD; multiple gastric and duodenal ulcers, mild Schatzki ring in the distal esophagus, LA grade B esophagitis, irregular Z-line.  Repeat endoscopy in 3 months.  . ESOPHAGOGASTRODUODENOSCOPY (EGD) WITH ESOPHAGEAL DILATION N/A 06/17/2013   [id number] esophagus-status post Venia Minks dilation. Hiatal hernia. Gastric erosions s/p bx  . foreign body removal     splinter removed from right side of face    Current Outpatient Medications on File Prior to Visit  Medication Sig Dispense Refill  . acetaminophen (TYLENOL) 325 MG tablet Take 2 tablets (650 mg total) by mouth every 6 (six) hours as needed for mild pain or headache (or Fever >/= 101). 60 tablet 0  . albuterol (VENTOLIN HFA) 108 (90 Base) MCG/ACT inhaler Inhale 1-2 puffs into the lungs every 4 (four) hours as needed.    Marland Kitchen amLODipine (NORVASC) 10 MG tablet Take 10 mg by mouth daily.     . busPIRone (BUSPAR) 5 MG tablet Take 5 mg by mouth 2 (two) times daily.    . Cholecalciferol (VITAMIN D-3) 1000 UNITS CAPS Take 1 capsule by mouth 2 (two) times daily.     Marland Kitchen  CRANBERRY FRUIT PO Take 1 capsule by mouth daily.    Marland Kitchen gabapentin (NEURONTIN) 600 MG tablet Take 600 mg by mouth 3 (three) times daily.    Marland Kitchen losartan (COZAAR) 25 MG tablet Take 25 mg by mouth daily.    . memantine (NAMENDA) 5 MG tablet Take 5 mg by mouth 2 (two) times daily.     . metFORMIN (GLUCOPHAGE) 500 MG tablet Take 500 mg by mouth 2 (two) times daily with a meal.     . metroNIDAZOLE (METROCREAM) 0.75 % cream Apply 1 application topically 2 (two) times daily as needed.    . mirtazapine (REMERON) 30 MG tablet Take 30 mg by mouth at bedtime.     . Multiple  Vitamin (MULTIVITAMIN WITH MINERALS) TABS tablet Take 1 tablet by mouth daily.    . multivitamin-lutein (OCUVITE-LUTEIN) CAPS Take 1 capsule by mouth every morning.     Marland Kitchen omeprazole (PRILOSEC) 40 MG capsule Take 1 capsule (40 mg total) by mouth 2 (two) times daily. 60 capsule 2  . Oxycodone HCl 10 MG TABS Take 10 mg by mouth every 4 (four) hours as needed.    . potassium chloride SA (K-DUR,KLOR-CON) 20 MEQ tablet Take 20 mEq by mouth every morning.     . primidone (MYSOLINE) 50 MG tablet Take 25 mg by mouth at bedtime.    . simvastatin (ZOCOR) 40 MG tablet Take 40 mg by mouth daily.    . traMADol (ULTRAM) 50 MG tablet Take 50 mg by mouth every 4 (four) hours as needed.    . vitamin C (ASCORBIC ACID) 500 MG tablet Take 500 mg by mouth every morning.     . zolpidem (AMBIEN) 5 MG tablet Take 5 mg by mouth at bedtime.     No current facility-administered medications on file prior to visit.    Social History   Socioeconomic History  . Marital status: Widowed    Spouse name: Not on file  . Number of children: 4  . Years of education: Not on file  . Highest education level: Not on file  Occupational History  . Not on file  Tobacco Use  . Smoking status: Former Smoker    Quit date: 08/30/1994    Years since quitting: 25.0  . Smokeless tobacco: Never Used  Substance and Sexual Activity  . Alcohol use: No    Alcohol/week: 0.0 standard drinks  . Drug use: No  . Sexual activity: Not Currently  Other Topics Concern  . Not on file  Social History Narrative   Adopted granddaughter and raised great grandson.   Pt states that her grandson lives with her sometimes,    Social Determinants of Health   Financial Resource Strain: Low Risk   . Difficulty of Paying Living Expenses: Not hard at all  Food Insecurity: No Food Insecurity  . Worried About Charity fundraiser in the Last Year: Never true  . Ran Out of Food in the Last Year: Never true  Transportation Needs: No Transportation Needs    . Lack of Transportation (Medical): No  . Lack of Transportation (Non-Medical): No  Physical Activity: Inactive  . Days of Exercise per Week: 0 days  . Minutes of Exercise per Session: 0 min  Stress: Stress Concern Present  . Feeling of Stress : To some extent  Social Connections: Slightly Isolated  . Frequency of Communication with Friends and Family: More than three times a week  . Frequency of Social Gatherings with Friends and Family: More than  three times a week  . Attends Religious Services: More than 4 times per year  . Active Member of Clubs or Organizations: Yes  . Attends Archivist Meetings: More than 4 times per year  . Marital Status: Widowed  Intimate Partner Violence: Not At Risk  . Fear of Current or Ex-Partner: No  . Emotionally Abused: No  . Physically Abused: No  . Sexually Abused: No    Family History  Problem Relation Age of Onset  . Alzheimer's disease Mother   . Cancer Father   . Diabetes Daughter   . High blood pressure Daughter   . High blood pressure Daughter   . Cancer Sister   . Cancer Brother   . Pseudochol deficiency Neg Hx   . Malignant hyperthermia Neg Hx   . Hypotension Neg Hx   . Anesthesia problems Neg Hx   . Colon cancer Neg Hx     BP 136/68   Pulse 82   Temp (!) 96.7 F (35.9 C)   Ht 5\' 3"  (1.6 m)   Wt 167 lb (75.8 kg)   BMI 29.58 kg/m   Body mass index is 29.58 kg/m.      Objective:   Physical Exam Vitals and nursing note reviewed.  Constitutional:      Appearance: She is well-developed.  HENT:     Head: Normocephalic and atraumatic.  Eyes:     Conjunctiva/sclera: Conjunctivae normal.     Pupils: Pupils are equal, round, and reactive to light.  Cardiovascular:     Rate and Rhythm: Normal rate and regular rhythm.  Pulmonary:     Effort: Pulmonary effort is normal.  Abdominal:     Palpations: Abdomen is soft.  Musculoskeletal:     Cervical back: Normal range of motion and neck supple.        Legs:  Skin:    General: Skin is warm and dry.  Neurological:     Mental Status: She is alert and oriented to person, place, and time.     Cranial Nerves: No cranial nerve deficit.     Motor: No abnormal muscle tone.     Coordination: Coordination normal.     Deep Tendon Reflexes: Reflexes are normal and symmetric. Reflexes normal.  Psychiatric:        Behavior: Behavior normal.        Thought Content: Thought content normal.        Judgment: Judgment normal.    X-rays were done of the left knee, reported separately.       Assessment & Plan:   Encounter Diagnosis  Name Primary?  . Chronic pain of left knee Yes   PROCEDURE NOTE:  The patient requests injections of the left knee , verbal consent was obtained.  The left knee was prepped appropriately after time out was performed.   Sterile technique was observed and injection of 1 cc of Depo-Medrol 40 mg with several cc's of plain xylocaine. Anesthesia was provided by ethyl chloride and a 20-gauge needle was used to inject the knee area. The injection was tolerated well.  A band aid dressing was applied.  The patient was advised to apply ice later today and tomorrow to the injection sight as needed.  I have told her the left knee is "worn out". She is a candidate for total knee.  I will see how the injection does.  Begin Aleve one bid pc.  Return in two weeks.  Call if any problem.  Precautions discussed.  Electronically Signed Sanjuana Kava, MD 1/14/202110:11 AM

## 2019-09-28 DIAGNOSIS — G894 Chronic pain syndrome: Secondary | ICD-10-CM | POA: Diagnosis not present

## 2019-09-28 DIAGNOSIS — M1991 Primary osteoarthritis, unspecified site: Secondary | ICD-10-CM | POA: Diagnosis not present

## 2019-09-28 DIAGNOSIS — R609 Edema, unspecified: Secondary | ICD-10-CM | POA: Diagnosis not present

## 2019-10-05 ENCOUNTER — Ambulatory Visit: Payer: Medicare Other | Admitting: Orthopaedic Surgery

## 2019-11-01 DIAGNOSIS — G894 Chronic pain syndrome: Secondary | ICD-10-CM | POA: Diagnosis not present

## 2019-11-02 ENCOUNTER — Ambulatory Visit: Payer: Medicare Other | Admitting: Orthopaedic Surgery

## 2019-11-09 ENCOUNTER — Encounter: Payer: Self-pay | Admitting: Orthopaedic Surgery

## 2019-11-09 ENCOUNTER — Other Ambulatory Visit: Payer: Self-pay

## 2019-11-09 ENCOUNTER — Ambulatory Visit: Payer: Medicare Other | Admitting: Orthopaedic Surgery

## 2019-11-09 ENCOUNTER — Ambulatory Visit: Payer: Medicare Other

## 2019-11-09 VITALS — BP 175/83 | HR 106 | Temp 98.0°F | Ht 62.0 in | Wt 168.1 lb

## 2019-11-09 DIAGNOSIS — M25561 Pain in right knee: Secondary | ICD-10-CM | POA: Diagnosis not present

## 2019-11-09 DIAGNOSIS — G8929 Other chronic pain: Secondary | ICD-10-CM

## 2019-11-09 NOTE — Progress Notes (Signed)
Patient [id number] Laurie Mcfarland, female DOB:03/14/1935, 85 y.o. [id number]  Chief Complaint  Patient presents with  . Knee Pain    R/hurting for a while/aches and swells at time    Laurie  XARENI Mcfarland is a 85 y.o. female who is having pain in the right knee now.  Prior it was the left knee.  She has popping and swelling but no giving way.  She has no redness or trauma.  She requests injection.   Body mass index is 30.75 kg/m.  ROS  Review of Systems  Constitutional: Positive for activity change.  Musculoskeletal: Positive for gait problem and joint swelling.  Psychiatric/Behavioral: The patient is nervous/anxious.   All other systems reviewed and are negative.   All other systems reviewed and are negative.  The following is a summary of the past history medically, past history surgically, known current medicines, social history and family history.  This information is gathered electronically by the computer from prior information and documentation.  I review this each visit and have found including this information at this point in the chart is beneficial and informative.    Past Medical History:  Diagnosis Date  . Anxiety   . Arthritis   . Cataracts, bilateral   . Complication of anesthesia    pt. states that she remembers parts of Laurie Mcfarland surgery when she had Laurie Mcfarland TAH  . Depression   . Diabetes mellitus   . Diabetic peripheral neuropathy (Laurie Mcfarland)   . Fibromyalgia   . GERD (gastroesophageal reflux disease)   . Heart burn   . High cholesterol   . Hyperlipidemia   . Hypertension   . Leg swelling   . Shortness of breath    with exertion  . Weight loss   . Wheezing     Past Surgical History:  Procedure Laterality Date  . ABDOMINAL HYSTERECTOMY    . CATARACT EXTRACTION W/PHACO  12/10/2011   Procedure: CATARACT EXTRACTION PHACO AND INTRAOCULAR LENS PLACEMENT (IOC);  Surgeon: Elta Guadeloupe T. Gershon Crane, MD;  Location: [facility];  Service: Ophthalmology;  Laterality: Left;  CDE=10.72  . CATARACT  EXTRACTION W/PHACO  01/14/2012   Procedure: CATARACT EXTRACTION PHACO AND INTRAOCULAR LENS PLACEMENT (IOC);  Surgeon: Elta Guadeloupe T. Gershon Crane, MD;  Location: [facility];  Service: Ophthalmology;  Laterality: Right;  CDE:11.63  . ESOPHAGOGASTRODUODENOSCOPY N/A 07/18/2019   Procedure: ESOPHAGOGASTRODUODENOSCOPY (EGD);  Surgeon: Rogene Houston, MD; multiple gastric and duodenal ulcers, mild Schatzki ring in the distal esophagus, LA grade B esophagitis, irregular Z-line.  Repeat endoscopy in 3 months.  . ESOPHAGOGASTRODUODENOSCOPY (EGD) WITH ESOPHAGEAL DILATION N/A 06/17/2013   [id number] esophagus-status post Venia Minks dilation. Hiatal hernia. Gastric erosions s/p bx  . foreign body removal     splinter removed from right side of face    Family History  Problem Relation Age of Onset  . Alzheimer's disease Mother   . Cancer Father   . Diabetes Daughter   . High blood pressure Daughter   . High blood pressure Daughter   . Cancer Sister   . Cancer Brother   . Pseudochol deficiency Neg Hx   . Malignant hyperthermia Neg Hx   . Hypotension Neg Hx   . Anesthesia problems Neg Hx   . Colon cancer Neg Hx     Social History Social History   Tobacco Use  . Smoking status: Former Smoker    Quit date: 08/30/1994    Years since quitting: 25.2  . Smokeless tobacco: Never Used  Substance Use Topics  . Alcohol  use: No    Alcohol/week: 0.0 standard drinks  . Drug use: No    Allergies  Allergen Reactions  . Penicillins Rash    Has patient had a PCN reaction causing immediate rash, facial/tongue/throat swelling, SOB or lightheadedness with hypotension: No Has patient had a PCN reaction causing severe rash involving mucus membranes or skin necrosis: No Has patient had a PCN reaction that required hospitalization: No Has patient had a PCN reaction occurring within the last 10 years: No If all of the above answers are "NO", then may proceed with Cephalosporin use.     Current Outpatient Medications   Medication Sig Dispense Refill  . acetaminophen (TYLENOL) 325 MG tablet Take 2 tablets (650 mg total) by mouth every 6 (six) hours as needed for mild pain or headache (or Fever >/= 101). 60 tablet 0  . albuterol (VENTOLIN HFA) 108 (90 Base) MCG/ACT inhaler Inhale 1-2 puffs into the lungs every 4 (four) hours as needed.    Marland Kitchen amLODipine (NORVASC) 10 MG tablet Take 10 mg by mouth daily.     . busPIRone (BUSPAR) 5 MG tablet Take 5 mg by mouth 2 (two) times daily.    . Cholecalciferol (VITAMIN D-3) 1000 UNITS CAPS Take 1 capsule by mouth 2 (two) times daily.     Marland Kitchen gabapentin (NEURONTIN) 600 MG tablet Take 600 mg by mouth 3 (three) times daily.    Marland Kitchen losartan (COZAAR) 25 MG tablet Take 25 mg by mouth daily.    . memantine (NAMENDA) 5 MG tablet Take 5 mg by mouth 2 (two) times daily.     . metFORMIN (GLUCOPHAGE) 500 MG tablet Take 500 mg by mouth 2 (two) times daily with a meal.     . metroNIDAZOLE (METROCREAM) 0.75 % cream Apply 1 application topically 2 (two) times daily as needed.    . mirtazapine (REMERON) 30 MG tablet Take 30 mg by mouth at bedtime.     . Multiple Vitamin (MULTIVITAMIN WITH MINERALS) TABS tablet Take 1 tablet by mouth daily.    . multivitamin-lutein (OCUVITE-LUTEIN) CAPS Take 1 capsule by mouth every morning.     . Oxycodone HCl 10 MG TABS Take 10 mg by mouth every 4 (four) hours as needed.    . potassium chloride SA (K-DUR,KLOR-CON) 20 MEQ tablet Take 20 mEq by mouth every morning.     . primidone (MYSOLINE) 50 MG tablet Take 25 mg by mouth at bedtime.    . simvastatin (ZOCOR) 40 MG tablet Take 40 mg by mouth daily.    . traMADol (ULTRAM) 50 MG tablet Take 50 mg by mouth every 4 (four) hours as needed.    . vitamin C (ASCORBIC ACID) 500 MG tablet Take 500 mg by mouth every morning.     . zolpidem (AMBIEN) 5 MG tablet Take 5 mg by mouth at bedtime.    Marland Kitchen CRANBERRY FRUIT PO Take 1 capsule by mouth daily.    Marland Kitchen omeprazole (PRILOSEC) 40 MG capsule Take 1 capsule (40 mg total) by  mouth 2 (two) times daily. 60 capsule 2   No current facility-administered medications for this visit.     Physical Exam  Blood pressure (!) 175/83, pulse (!) 106, temperature 98 F (36.7 C), height 5\' 2"  (1.575 m), weight 168 lb 2 oz (76.3 kg).  Constitutional: overall normal hygiene, normal nutrition, well developed, normal grooming, normal body habitus. Assistive device:none  Musculoskeletal: gait and station Limp right, muscle tone and strength are normal, no tremors or atrophy is present.  Marland Kitchen  Neurological: coordination overall normal.  Deep tendon reflex/nerve stretch intact.  Sensation normal.  Cranial nerves II-XII intact.   Skin:   Normal overall no scars, lesions, ulcers or rashes. No psoriasis.  Psychiatric: Alert and oriented x 3.  Recent memory intact, remote memory unclear.  Normal mood and affect. Well groomed.  Good eye contact.  Cardiovascular: overall no swelling, no varicosities, no edema bilaterally, normal temperatures of the legs and arms, no clubbing, cyanosis and good capillary refill.  Lymphatic: palpation is normal.  Right knee is tender, has slight effusion, limp to the right, ROM 0 to 105, stable, NV intact. All other systems reviewed and are negative   The patient has been educated about the nature of the problem(s) and counseled on treatment options.  The patient appeared to understand what I have discussed and is in agreement with it.  Encounter Diagnosis  Name Primary?  . Chronic pain of right knee Yes   X-rays were done of the right knee, reported separately.  PLAN Call if any problems.  Precautions discussed.  Continue current medications.   Return as needed.  PROCEDURE NOTE:  The patient requests injections of the right knee , verbal consent was obtained.  The right knee was prepped appropriately after time out was performed.   Sterile technique was observed and injection of 1 cc of Depo-Medrol 40 mg with several cc's of plain  xylocaine. Anesthesia was provided by ethyl chloride and a 20-gauge needle was used to inject the knee area. The injection was tolerated well.  A band aid dressing was applied.  The patient was advised to apply ice later today and tomorrow to the injection sight as needed.    Electronically Signed Sanjuana Kava, MD 3/9/20212:04 PM

## 2019-11-18 NOTE — Patient Instructions (Signed)
Laurie Mcfarland  11/18/2019     @PREFPERIOPPHARMACY @   Your procedure is scheduled on  11/25/2019 .  Report to [facility] at  [phone]  A.M.  Call this number if you have problems the morning of surgery:  [phone]   Remember:  Follow the diet instructions given to you by  Dr Roseanne Kaufman office.                      Take these medicines the morning of surgery with A SIP OF WATER  Amlodipine, buspar, gabapentin, losartan, namenda, prilosec, oxycodone(if needed) or tramadol(if needed). Use your inhaler before you come.    Do not wear jewelry, make-up or nail polish.  Do not wear lotions, powders, or perfumes. Please wear deodorant and brush your teeth.  Do not shave 48 hours prior to surgery.  Men may shave face and neck.  Do not bring valuables to the hospital.  [facility] is not responsible for any belongings or valuables.  Contacts, dentures or bridgework may not be worn into surgery.  Leave your suitcase in the car.  After surgery it may be brought to your room.  For patients admitted to the hospital, discharge time will be determined by your treatment team.  Patients discharged the day of surgery will not be allowed to drive home.   Name and phone number of your driver:   family Special instructions:  DO NOT smoke the morning of your procedure.  Please read over the following fact sheets that you were given. Anesthesia Post-op Instructions and Care and Recovery After Surgery       Upper Endoscopy, Adult, Care After This sheet gives you information about how to care for yourself after your procedure. Your health care provider may also give you more specific instructions. If you have problems or questions, contact your health care provider. What can I expect after the procedure? After the procedure, it is common to have:  A sore throat.  Mild stomach pain or discomfort.  Bloating.  Nausea. Follow these instructions at home:   Follow instructions from your health  care provider about what to eat or drink after your procedure.  Return to your normal activities as told by your health care provider. Ask your health care provider what activities are safe for you.  Take over-the-counter and prescription medicines only as told by your health care provider.  Do not drive for 24 hours if you were given a sedative during your procedure.  Keep all follow-up visits as told by your health care provider. This is important. Contact a health care provider if you have:  A sore throat that lasts longer than one day.  Trouble swallowing. Get help right away if:  You vomit blood or your vomit looks like coffee grounds.  You have: ? A fever. ? Bloody, black, or tarry stools. ? A severe sore throat or you cannot swallow. ? Difficulty breathing. ? Severe pain in your chest or abdomen. Summary  After the procedure, it is common to have a sore throat, mild stomach discomfort, bloating, and nausea.  Do not drive for 24 hours if you were given a sedative during the procedure.  Follow instructions from your health care provider about what to eat or drink after your procedure.  Return to your normal activities as told by your health care provider. This information is not intended to replace advice given to you by your health care provider. Make sure  you discuss any questions you have with your health care provider. Document Revised: 02/10/2018 Document Reviewed: 01/19/2018 Elsevier Patient Education  2020 [place] These instructions provide you with information about caring for yourself after your procedure. Your health care provider may also give you more specific instructions. Your treatment has been planned according to current medical practices, but problems sometimes occur. Call your health care provider if you have any problems or questions after your procedure. What can I expect after the procedure? After your  procedure, you may:  Feel sleepy for several hours.  Feel clumsy and have poor balance for several hours.  Feel forgetful about what happened after the procedure.  Have poor judgment for several hours.  Feel nauseous or vomit.  Have a sore throat if you had a breathing tube during the procedure. Follow these instructions at home: For at least 24 hours after the procedure:      Have a responsible adult stay with you. It is important to have someone help care for you until you are awake and alert.  Rest as needed.  Do not: ? Participate in activities in which you could fall or become injured. ? Drive. ? Use heavy machinery. ? Drink alcohol. ? Take sleeping pills or medicines that cause drowsiness. ? Make important decisions or sign legal documents. ? Take care of children on your own. Eating and drinking  Follow the diet that is recommended by your health care provider.  If you vomit, drink water, juice, or soup when you can drink without vomiting.  Make sure you have little or no nausea before eating solid foods. General instructions  Take over-the-counter and prescription medicines only as told by your health care provider.  If you have sleep apnea, surgery and certain medicines can increase your risk for breathing problems. Follow instructions from your health care provider about wearing your sleep device: ? Anytime you are sleeping, including during daytime naps. ? While taking prescription pain medicines, sleeping medicines, or medicines that make you drowsy.  If you smoke, do not smoke without supervision.  Keep all follow-up visits as told by your health care provider. This is important. Contact a health care provider if:  You keep feeling nauseous or you keep vomiting.  You feel light-headed.  You develop a rash.  You have a fever. Get help right away if:  You have trouble breathing. Summary  For several hours after your procedure, you may feel  sleepy and have poor judgment.  Have a responsible adult stay with you for at least 24 hours or until you are awake and alert. This information is not intended to replace advice given to you by your health care provider. Make sure you discuss any questions you have with your health care provider. Document Revised: 11/17/2017 Document Reviewed: 12/10/2015 Elsevier Patient Education  [place].

## 2019-11-23 ENCOUNTER — Other Ambulatory Visit: Payer: Self-pay

## 2019-11-23 ENCOUNTER — Encounter (HOSPITAL_COMMUNITY)
Admission: RE | Admit: 2019-11-23 | Discharge: 2019-11-23 | Disposition: A | Discharge status: Home / Self Care | Payer: Medicare Other | Source: Ambulatory Visit | Attending: Internal Medicine | Admitting: Internal Medicine

## 2019-11-23 ENCOUNTER — Encounter (HOSPITAL_COMMUNITY): Payer: Self-pay

## 2019-11-23 ENCOUNTER — Other Ambulatory Visit (HOSPITAL_COMMUNITY)
Admission: RE | Admit: 2019-11-23 | Discharge: 2019-11-23 | Disposition: A | Discharge status: Home / Self Care | Payer: Medicare Other | Source: Ambulatory Visit | Attending: Internal Medicine | Admitting: Internal Medicine

## 2019-11-23 ENCOUNTER — Telehealth: Payer: Self-pay | Admitting: Internal Medicine

## 2019-11-23 ENCOUNTER — Telehealth: Payer: Self-pay

## 2019-11-23 DIAGNOSIS — Z90710 Acquired absence of both cervix and uterus: Secondary | ICD-10-CM | POA: Diagnosis not present

## 2019-11-23 DIAGNOSIS — Z880 Allergy status to penicillin: Secondary | ICD-10-CM | POA: Diagnosis not present

## 2019-11-23 DIAGNOSIS — M797 Fibromyalgia: Secondary | ICD-10-CM | POA: Diagnosis not present

## 2019-11-23 DIAGNOSIS — I10 Essential (primary) hypertension: Secondary | ICD-10-CM | POA: Diagnosis not present

## 2019-11-23 DIAGNOSIS — Z9842 Cataract extraction status, left eye: Secondary | ICD-10-CM | POA: Diagnosis not present

## 2019-11-23 DIAGNOSIS — Z961 Presence of intraocular lens: Secondary | ICD-10-CM | POA: Diagnosis not present

## 2019-11-23 DIAGNOSIS — Z9841 Cataract extraction status, right eye: Secondary | ICD-10-CM | POA: Diagnosis not present

## 2019-11-23 DIAGNOSIS — K2950 Unspecified chronic gastritis without bleeding: Secondary | ICD-10-CM | POA: Diagnosis not present

## 2019-11-23 DIAGNOSIS — Z833 Family history of diabetes mellitus: Secondary | ICD-10-CM | POA: Diagnosis not present

## 2019-11-23 DIAGNOSIS — K269 Duodenal ulcer, unspecified as acute or chronic, without hemorrhage or perforation: Secondary | ICD-10-CM | POA: Diagnosis not present

## 2019-11-23 DIAGNOSIS — F419 Anxiety disorder, unspecified: Secondary | ICD-10-CM | POA: Diagnosis not present

## 2019-11-23 DIAGNOSIS — Z809 Family history of malignant neoplasm, unspecified: Secondary | ICD-10-CM | POA: Diagnosis not present

## 2019-11-23 DIAGNOSIS — K2980 Duodenitis without bleeding: Secondary | ICD-10-CM | POA: Diagnosis not present

## 2019-11-23 DIAGNOSIS — Z79899 Other long term (current) drug therapy: Secondary | ICD-10-CM | POA: Diagnosis not present

## 2019-11-23 DIAGNOSIS — M1990 Unspecified osteoarthritis, unspecified site: Secondary | ICD-10-CM | POA: Diagnosis not present

## 2019-11-23 DIAGNOSIS — E7800 Pure hypercholesterolemia, unspecified: Secondary | ICD-10-CM | POA: Diagnosis not present

## 2019-11-23 DIAGNOSIS — R0602 Shortness of breath: Secondary | ICD-10-CM | POA: Diagnosis not present

## 2019-11-23 DIAGNOSIS — Z7984 Long term (current) use of oral hypoglycemic drugs: Secondary | ICD-10-CM | POA: Diagnosis not present

## 2019-11-23 DIAGNOSIS — Z8711 Personal history of peptic ulcer disease: Secondary | ICD-10-CM | POA: Diagnosis present

## 2019-11-23 DIAGNOSIS — Z8249 Family history of ischemic heart disease and other diseases of the circulatory system: Secondary | ICD-10-CM | POA: Diagnosis not present

## 2019-11-23 DIAGNOSIS — K219 Gastro-esophageal reflux disease without esophagitis: Secondary | ICD-10-CM | POA: Diagnosis not present

## 2019-11-23 DIAGNOSIS — F329 Major depressive disorder, single episode, unspecified: Secondary | ICD-10-CM | POA: Diagnosis not present

## 2019-11-23 DIAGNOSIS — E785 Hyperlipidemia, unspecified: Secondary | ICD-10-CM | POA: Diagnosis not present

## 2019-11-23 DIAGNOSIS — Z20822 Contact with and (suspected) exposure to covid-19: Secondary | ICD-10-CM | POA: Diagnosis not present

## 2019-11-23 DIAGNOSIS — E1142 Type 2 diabetes mellitus with diabetic polyneuropathy: Secondary | ICD-10-CM | POA: Diagnosis not present

## 2019-11-23 LAB — BASIC METABOLIC PANEL
Anion gap: 12 (ref 5–15)
BUN: 24 mg/dL — ABNORMAL HIGH (ref 8–23)
CO2: 23 mmol/L (ref 22–32)
Calcium: 9.4 mg/dL (ref 8.9–10.3)
Chloride: 101 mmol/L (ref 98–111)
Creatinine, Ser: 1.03 mg/dL — ABNORMAL HIGH (ref 0.44–1.00)
GFR calc Af Amer: 58 mL/min — ABNORMAL LOW (ref >60)
GFR calc non Af Amer: 50 mL/min — ABNORMAL LOW (ref >60)
Glucose, Bld: 108 mg/dL — ABNORMAL HIGH (ref 70–99)
Potassium: 4.6 mmol/L (ref 3.5–5.1)
Sodium: 136 mmol/L (ref 135–145)

## 2019-11-23 LAB — SARS CORONAVIRUS 2 (TAT 6-24 HRS): SARS Coronavirus 2: NEGATIVE

## 2019-11-23 NOTE — Telephone Encounter (Signed)
Rosalyn Gess, RN at [facility] called to say that Pt was asking to change her procedure time for Thursday to something later due to transportation. I told her ME was on another call and I would put a message in. Please advise [phone]

## 2019-11-23 NOTE — Telephone Encounter (Signed)
Called pt, LMTCB.

## 2019-11-23 NOTE — Telephone Encounter (Signed)
Laurie Mcfarland at pre-service center called office, EGD scheduled for 11/25/19 needs PA.  PA for EGD submitted via [facility] website. Case approved. PA# [id number], valid 11/25/19-02/23/20.

## 2019-11-24 NOTE — Telephone Encounter (Signed)
Called pt. Made aware currently no openings to move down on schedule. She voiced understanding.

## 2019-11-25 ENCOUNTER — Encounter (HOSPITAL_COMMUNITY): Payer: Self-pay | Admitting: Internal Medicine

## 2019-11-25 ENCOUNTER — Encounter (HOSPITAL_COMMUNITY)
Admission: RE | Disposition: A | Discharge status: Home / Self Care | Payer: Self-pay | Source: Home / Self Care | Attending: Internal Medicine

## 2019-11-25 ENCOUNTER — Ambulatory Visit (HOSPITAL_COMMUNITY): Payer: Medicare Other | Admitting: Anesthesiology

## 2019-11-25 ENCOUNTER — Ambulatory Visit (HOSPITAL_COMMUNITY)
Admission: RE | Admit: 2019-11-25 | Discharge: 2019-11-25 | Disposition: A | Discharge status: Home / Self Care | Payer: Medicare Other | Attending: Internal Medicine | Admitting: Internal Medicine

## 2019-11-25 DIAGNOSIS — E785 Hyperlipidemia, unspecified: Secondary | ICD-10-CM | POA: Diagnosis not present

## 2019-11-25 DIAGNOSIS — Z90710 Acquired absence of both cervix and uterus: Secondary | ICD-10-CM | POA: Insufficient documentation

## 2019-11-25 DIAGNOSIS — Z833 Family history of diabetes mellitus: Secondary | ICD-10-CM | POA: Insufficient documentation

## 2019-11-25 DIAGNOSIS — Z961 Presence of intraocular lens: Secondary | ICD-10-CM | POA: Insufficient documentation

## 2019-11-25 DIAGNOSIS — Z9841 Cataract extraction status, right eye: Secondary | ICD-10-CM | POA: Insufficient documentation

## 2019-11-25 DIAGNOSIS — Z87891 Personal history of nicotine dependence: Secondary | ICD-10-CM | POA: Insufficient documentation

## 2019-11-25 DIAGNOSIS — Z880 Allergy status to penicillin: Secondary | ICD-10-CM | POA: Diagnosis not present

## 2019-11-25 DIAGNOSIS — K269 Duodenal ulcer, unspecified as acute or chronic, without hemorrhage or perforation: Secondary | ICD-10-CM | POA: Insufficient documentation

## 2019-11-25 DIAGNOSIS — F419 Anxiety disorder, unspecified: Secondary | ICD-10-CM | POA: Insufficient documentation

## 2019-11-25 DIAGNOSIS — K259 Gastric ulcer, unspecified as acute or chronic, without hemorrhage or perforation: Secondary | ICD-10-CM

## 2019-11-25 DIAGNOSIS — M797 Fibromyalgia: Secondary | ICD-10-CM | POA: Insufficient documentation

## 2019-11-25 DIAGNOSIS — R0602 Shortness of breath: Secondary | ICD-10-CM | POA: Insufficient documentation

## 2019-11-25 DIAGNOSIS — M1990 Unspecified osteoarthritis, unspecified site: Secondary | ICD-10-CM | POA: Diagnosis not present

## 2019-11-25 DIAGNOSIS — F329 Major depressive disorder, single episode, unspecified: Secondary | ICD-10-CM | POA: Insufficient documentation

## 2019-11-25 DIAGNOSIS — K2950 Unspecified chronic gastritis without bleeding: Secondary | ICD-10-CM | POA: Diagnosis not present

## 2019-11-25 DIAGNOSIS — Z809 Family history of malignant neoplasm, unspecified: Secondary | ICD-10-CM | POA: Insufficient documentation

## 2019-11-25 DIAGNOSIS — Z8249 Family history of ischemic heart disease and other diseases of the circulatory system: Secondary | ICD-10-CM | POA: Diagnosis not present

## 2019-11-25 DIAGNOSIS — Z820 Family history of epilepsy and other diseases of the nervous system: Secondary | ICD-10-CM | POA: Insufficient documentation

## 2019-11-25 DIAGNOSIS — Z79899 Other long term (current) drug therapy: Secondary | ICD-10-CM | POA: Insufficient documentation

## 2019-11-25 DIAGNOSIS — K2981 Duodenitis with bleeding: Secondary | ICD-10-CM

## 2019-11-25 DIAGNOSIS — K3189 Other diseases of stomach and duodenum: Secondary | ICD-10-CM

## 2019-11-25 DIAGNOSIS — E7800 Pure hypercholesterolemia, unspecified: Secondary | ICD-10-CM | POA: Insufficient documentation

## 2019-11-25 DIAGNOSIS — Z20822 Contact with and (suspected) exposure to covid-19: Secondary | ICD-10-CM | POA: Diagnosis not present

## 2019-11-25 DIAGNOSIS — Z9842 Cataract extraction status, left eye: Secondary | ICD-10-CM | POA: Diagnosis not present

## 2019-11-25 DIAGNOSIS — E1142 Type 2 diabetes mellitus with diabetic polyneuropathy: Secondary | ICD-10-CM | POA: Insufficient documentation

## 2019-11-25 DIAGNOSIS — E119 Type 2 diabetes mellitus without complications: Secondary | ICD-10-CM | POA: Diagnosis not present

## 2019-11-25 DIAGNOSIS — Z7984 Long term (current) use of oral hypoglycemic drugs: Secondary | ICD-10-CM | POA: Insufficient documentation

## 2019-11-25 DIAGNOSIS — K219 Gastro-esophageal reflux disease without esophagitis: Secondary | ICD-10-CM | POA: Diagnosis not present

## 2019-11-25 DIAGNOSIS — K2980 Duodenitis without bleeding: Secondary | ICD-10-CM | POA: Insufficient documentation

## 2019-11-25 DIAGNOSIS — I10 Essential (primary) hypertension: Secondary | ICD-10-CM | POA: Diagnosis not present

## 2019-11-25 DIAGNOSIS — Z8619 Personal history of other infectious and parasitic diseases: Secondary | ICD-10-CM

## 2019-11-25 DIAGNOSIS — K319 Disease of stomach and duodenum, unspecified: Secondary | ICD-10-CM | POA: Diagnosis not present

## 2019-11-25 HISTORY — PX: ESOPHAGOGASTRODUODENOSCOPY (EGD) WITH PROPOFOL: SHX5813

## 2019-11-25 LAB — GLUCOSE, CAPILLARY: Glucose-Capillary: 114 mg/dL — ABNORMAL HIGH (ref 70–99)

## 2019-11-25 SURGERY — ESOPHAGOGASTRODUODENOSCOPY (EGD) WITH PROPOFOL
Anesthesia: General

## 2019-11-25 MED ORDER — PROPOFOL 10 MG/ML IV BOLUS
INTRAVENOUS | Status: AC
  Filled 2019-11-25: qty 120

## 2019-11-25 MED ORDER — LACTATED RINGERS IV SOLN
INTRAVENOUS | Status: DC
  Administered 2019-11-25: 07:00:00 1000 mL via INTRAVENOUS

## 2019-11-25 MED ORDER — PROPOFOL 500 MG/50ML IV EMUL
INTRAVENOUS | Status: DC | PRN
  Administered 2019-11-25: 150 ug/kg/min via INTRAVENOUS

## 2019-11-25 MED ORDER — CHLORHEXIDINE GLUCONATE CLOTH 2 % EX PADS: 6.0000 | MEDICATED_PAD | Freq: Once | CUTANEOUS | Status: DC

## 2019-11-25 MED ORDER — GLYCOPYRROLATE 0.2 MG/ML IJ SOLN
INTRAMUSCULAR | Status: DC | PRN
  Administered 2019-11-25: .2 mg via INTRAVENOUS

## 2019-11-25 MED ORDER — LIDOCAINE HCL (CARDIAC) PF 50 MG/5ML IV SOSY
PREFILLED_SYRINGE | INTRAVENOUS | Status: DC | PRN
  Administered 2019-11-25: 50 mg via INTRAVENOUS

## 2019-11-25 MED ORDER — LIDOCAINE 2% (20 MG/ML) 5 ML SYRINGE
INTRAMUSCULAR | Status: AC
  Filled 2019-11-25: qty 10

## 2019-11-25 MED ORDER — KETAMINE HCL 50 MG/5ML IJ SOSY
PREFILLED_SYRINGE | INTRAMUSCULAR | Status: AC
  Filled 2019-11-25: qty 5

## 2019-11-25 MED ORDER — GLYCOPYRROLATE PF 0.2 MG/ML IJ SOSY
PREFILLED_SYRINGE | INTRAMUSCULAR | Status: AC
  Filled 2019-11-25: qty 1

## 2019-11-25 NOTE — Discharge Instructions (Signed)
EGD Discharge instructions Please read the instructions outlined below and refer to this sheet in the next few weeks. These discharge instructions provide you with general information on caring for yourself after you leave the hospital. Your doctor may also give you specific instructions. While your treatment has been planned according to the most current medical practices available, unavoidable complications occasionally occur. If you have any problems or questions after discharge, please call your doctor. ACTIVITY  You may resume your regular activity but move at a slower pace for the next 24 hours.   Take frequent rest periods for the next 24 hours.   Walking will help expel (get rid of) the air and reduce the bloated feeling in your abdomen.   No driving for 24 hours (because of the anesthesia (medicine) used during the test).   You may shower.   Do not sign any important legal documents or operate any machinery for 24 hours (because of the anesthesia used during the test).  NUTRITION  Drink plenty of fluids.   You may resume your normal diet.   Begin with a light meal and progress to your normal diet.   Avoid alcoholic beverages for 24 hours or as instructed by your caregiver.  MEDICATIONS  You may resume your normal medications unless your caregiver tells you otherwise.  WHAT YOU CAN EXPECT TODAY  You may experience abdominal discomfort such as a feeling of fullness or "gas" pains.  FOLLOW-UP  Your doctor will discuss the results of your test with you.  SEEK IMMEDIATE MEDICAL ATTENTION IF ANY OF THE FOLLOWING OCCUR:  Excessive nausea (feeling sick to your stomach) and/or vomiting.   Severe abdominal pain and distention (swelling).   Trouble swallowing.   Temperature over 101 F (37.8 C).   Rectal bleeding or vomiting of blood.   Ulcers look much better.  Biopsies taken.  Continue omeprazole twice daily  Further recommendations to follow pending review of  pathology report  Continue to avoid aspirin and NSAID products  At patient request, I called Loma Messing daughter at [phone] and reviewed results.

## 2019-11-25 NOTE — Transfer of Care (Signed)
Immediate Anesthesia Transfer of Care Note  Patient: Laurie Mcfarland  Procedure(s) Performed: ESOPHAGOGASTRODUODENOSCOPY (EGD) WITH PROPOFOL (N/A )  Patient Location: PACU  Anesthesia Type:General  Level of Consciousness: awake, alert  and patient cooperative  Airway & Oxygen Therapy: Patient Spontanous Breathing  Post-op Assessment: Report given to RN and Post -op Vital signs reviewed and stable  Post vital signs: Reviewed and stable  Last Vitals:  Vitals Value Taken Time  BP    Temp    Pulse    Resp    SpO2      Last Pain:  Vitals:   11/25/19 0732  TempSrc:   PainSc: 0-No pain      Patients Stated Pain Goal: 8 ([id number])  Complications: No apparent anesthesia complications  SEE PACU FLOW SHEET FOR VITAL SIGNS

## 2019-11-25 NOTE — Anesthesia Procedure Notes (Signed)
Date/Time: 11/25/2019 7:29 AM Performed by: Vista Deck, CRNA Pre-anesthesia Checklist: Patient identified, Emergency Drugs available, Suction available, Timeout performed and Patient being monitored Patient Re-evaluated:Patient Re-evaluated prior to induction Oxygen Delivery Method: Nasal Cannula

## 2019-11-25 NOTE — Op Note (Signed)
[facility] Patient Name: Laurie Mcfarland Procedure Date: 11/25/2019 7:26 AM MRN: [id number] Date of Birth: [birth year] Attending MD: Norvel Richards , MD CSN: [id number] Age: 85 Admit Type: Outpatient Procedure:                Upper GI endoscopy Indications:              Surveillance procedure Providers:                Norvel Richards, MD, Jeanann Lewandowsky. Sharon Seller, RN,                            Nelma Rothman, Technician Referring MD:              Medicines:                Propofol per Anesthesia Complications:            No immediate complications. Estimated Blood Loss:     Estimated blood loss was minimal. Procedure:                Pre-Anesthesia Assessment:                           - Prior to the procedure, a History and Physical                            was performed, and patient medications and                            allergies were reviewed. The patient's tolerance of                            previous anesthesia was also reviewed. The risks                            and benefits of the procedure and the sedation                            options and risks were discussed with the patient.                            All questions were answered, and informed consent                            was obtained. Prior Anticoagulants: The patient has                            taken no previous anticoagulant or antiplatelet                            agents. ASA Grade Assessment: II - A patient with                            mild systemic disease. After reviewing the risks  and benefits, the patient was deemed in                            satisfactory condition to undergo the procedure.                           After obtaining informed consent, the endoscope was                            passed under direct vision. Throughout the                            procedure, the patient's blood pressure, pulse, and                            oxygen  saturations were monitored continuously. The                            [id number]-H190 [id number]) scope was introduced through the                            mouth, and advanced to the second part of duodenum.                            The upper GI endoscopy was accomplished without                            difficulty. The patient tolerated the procedure                            well. Scope In: 7:35:08 AM Scope Out: 7:41:39 AM Total Procedure Duration: 0 hours 6 minutes 31 seconds  Findings:      The examined esophagus was normal.      Some antral erosions present. Some "crpe" appearing mid gastric body of       uncertain significance. No ulcer or infiltrating process observed.      Superficial erosions/ulcerations small scattered in the posterior bulb       and second portion. Please see photos. Biopsies of the gastric mucosa -       distal, mid, proximal as well as the duodenum taken for histology Impression:               - Normal esophagus.                           - Erosive gastropathy /abnormal gastric                            mucosa?"appears much better however than seen                            previously. Status post biopsy. Abnormal                            duodenum?"status post biopsy. Doubt hyper secretory  state but that diagnosis would remain in the                            differential at this time. She has been maintained                            on high-dose PPI therapy..                           - Moderate Sedation:      Moderate (conscious) sedation was personally administered by an       anesthesia professional. The following parameters were monitored: oxygen       saturation, heart rate, blood pressure, respiratory rate, EKG, adequacy       of pulmonary ventilation, and response to care. Recommendation:           - Patient has a contact number available for                            emergencies. The signs and symptoms of  potential                            delayed complications were discussed with the                            patient. Return to normal activities tomorrow.                            Written discharge instructions were provided to the                            patient.                           - Advance diet as tolerated. Continue omeprazole 40                            mg twice daily. Avoid NSAIDs. Further                            recommendations to follow. Procedure Code(s):        --- Professional ---                           [phone], Esophagogastroduodenoscopy, flexible,                            transoral; diagnostic, including collection of                            specimen(s) by brushing or washing, when performed                            (separate procedure) Diagnosis Code(s):        --- Professional ---  K31.89, Other diseases of stomach and duodenum CPT copyright 2019 American Medical Association. All rights reserved. The codes documented in this report are preliminary and upon coder review may  be revised to meet current compliance requirements. Cristopher Estimable. Ajahni Nay, MD Norvel Richards, MD 11/25/2019 7:54:05 AM This report has been signed electronically. Number of Addenda: 0

## 2019-11-25 NOTE — H&P (Signed)
@LOGO @   Primary Care Physician:  [name], MD Primary Gastroenterologist:  Dr. Gala Romney  Pre-Procedure History & Physical: HPI:  Laurie Mcfarland is a 85 y.o. female here for surveillance EGD.  History of multiple gastric and duodenal ulcers.  H. pylori positive and treated.  Here to verify healing.  Denies taking NSAIDs.  Denies any dysphagia symptoms.  Past Medical History:  Diagnosis Date  . Anxiety   . Arthritis   . Cataracts, bilateral   . Depression   . Diabetes mellitus   . Diabetic peripheral neuropathy ([place])   . Fibromyalgia   . GERD (gastroesophageal reflux disease)   . Heart burn   . High cholesterol   . Hyperlipidemia   . Hypertension   . Leg swelling   . Shortness of breath    with exertion  . Weight loss   . Wheezing     Past Surgical History:  Procedure Laterality Date  . ABDOMINAL HYSTERECTOMY    . CATARACT EXTRACTION W/PHACO  12/10/2011   Procedure: CATARACT EXTRACTION PHACO AND INTRAOCULAR LENS PLACEMENT (IOC);  Surgeon: Elta Guadeloupe T. Gershon Crane, MD;  Location: [facility];  Service: Ophthalmology;  Laterality: Left;  CDE=10.72  . CATARACT EXTRACTION W/PHACO  01/14/2012   Procedure: CATARACT EXTRACTION PHACO AND INTRAOCULAR LENS PLACEMENT (IOC);  Surgeon: Elta Guadeloupe T. Gershon Crane, MD;  Location: [facility];  Service: Ophthalmology;  Laterality: Right;  CDE:11.63  . ESOPHAGOGASTRODUODENOSCOPY N/A 07/18/2019   Procedure: ESOPHAGOGASTRODUODENOSCOPY (EGD);  Surgeon: Rogene Houston, MD; multiple gastric and duodenal ulcers, mild Schatzki ring in the distal esophagus, LA grade B esophagitis, irregular Z-line.  Repeat endoscopy in 3 months.  . ESOPHAGOGASTRODUODENOSCOPY (EGD) WITH ESOPHAGEAL DILATION N/A 06/17/2013   [id number] esophagus-status post Venia Minks dilation. Hiatal hernia. Gastric erosions s/p bx  . foreign body removal     splinter removed from right side of face    Prior to Admission medications   Medication Sig Start Date End Date Taking? Authorizing Provider  acetaminophen  (TYLENOL) 325 MG tablet Take 2 tablets (650 mg total) by mouth every 6 (six) hours as needed for mild pain or headache (or Fever >/= 101). 02/17/18  Yes Barton Dubois, MD  albuterol (VENTOLIN HFA) 108 (90 Base) MCG/ACT inhaler Inhale 1-2 puffs into the lungs every 4 (four) hours as needed. 06/24/19  Yes [provider]  amLODipine (NORVASC) 10 MG tablet Take 10 mg by mouth daily.  05/19/13  Yes [provider]  busPIRone (BUSPAR) 5 MG tablet Take 5 mg by mouth 2 (two) times daily. 06/21/19  Yes [provider]  Cholecalciferol (VITAMIN D-3) 1000 UNITS CAPS Take 1 capsule by mouth 2 (two) times daily.    Yes [provider]  CRANBERRY FRUIT PO Take 1 capsule by mouth daily.   Yes [provider]  gabapentin (NEURONTIN) 600 MG tablet Take 600 mg by mouth 3 (three) times daily. 07/05/19  Yes [provider]  losartan (COZAAR) 25 MG tablet Take 25 mg by mouth daily.   Yes [provider]  memantine (NAMENDA) 5 MG tablet Take 5 mg by mouth 2 (two) times daily.  01/28/18  Yes [provider]  metFORMIN (GLUCOPHAGE) 500 MG tablet Take 500 mg by mouth 2 (two) times daily with a meal.    Yes [provider]  metroNIDAZOLE (METROCREAM) 0.75 % cream Apply 1 application topically 2 (two) times daily as needed. 06/22/19  Yes [provider]  mirtazapine (REMERON) 30 MG tablet Take 30 mg by mouth at bedtime.  01/14/18  Yes [provider]  Multiple Vitamin (MULTIVITAMIN WITH MINERALS) TABS tablet Take 1 tablet by mouth daily.   Yes [provider]  multivitamin-lutein (OCUVITE-LUTEIN) CAPS Take 1 capsule by mouth every morning.    Yes [provider]  omeprazole (PRILOSEC) 40 MG capsule Take 1 capsule (40 mg total) by mouth 2 (two) times daily. 07/20/19  Yes Barton Dubois, MD  Oxycodone HCl 10 MG TABS Take 10 mg by mouth every 4 (four) hours as needed. 07/28/19  Yes [provider]  potassium  chloride SA (K-DUR,KLOR-CON) 20 MEQ tablet Take 20 mEq by mouth every morning.    Yes [provider]  primidone (MYSOLINE) 50 MG tablet Take 25 mg by mouth at bedtime. 06/21/19  Yes [provider]  simvastatin (ZOCOR) 40 MG tablet Take 40 mg by mouth daily. 06/06/16  Yes [provider]  traMADol (ULTRAM) 50 MG tablet Take 50 mg by mouth every 4 (four) hours as needed. 06/16/19  Yes [provider]  vitamin C (ASCORBIC ACID) 500 MG tablet Take 500 mg by mouth every morning.    Yes [provider]  zolpidem (AMBIEN) 5 MG tablet Take 5 mg by mouth at bedtime. 06/16/19  Yes [provider]    Allergies as of 08/19/2019 - Review Complete 08/19/2019  Allergen Reaction Noted  . Penicillins Rash 06/09/2013    Family History  Problem Relation Age of Onset  . Alzheimer's disease Mother   . Cancer Father   . Diabetes Daughter   . High blood pressure Daughter   . High blood pressure Daughter   . Cancer Sister   . Cancer Brother   . Pseudochol deficiency Neg Hx   . Malignant hyperthermia Neg Hx   . Hypotension Neg Hx   . Anesthesia problems Neg Hx   . Colon cancer Neg Hx     Social History   Socioeconomic History  . Marital status: Widowed    Spouse name: Not on file  . Number of children: 4  . Years of education: Not on file  . Highest education level: Not on file  Occupational History  . Not on file  Tobacco Use  . Smoking status: Former Smoker    Quit date: 08/30/1994    Years since quitting: 25.2  . Smokeless tobacco: Never Used  Substance and Sexual Activity  . Alcohol use: No    Alcohol/week: 0.0 standard drinks  . Drug use: No  . Sexual activity: Not Currently  Other Topics Concern  . Not on file  Social History Narrative   Adopted granddaughter and raised great grandson.   Pt states that her grandson lives with her sometimes,    Social Determinants of Health   Financial Resource Strain: Low Risk   .  Difficulty of Paying Living Expenses: Not hard at all  Food Insecurity: No Food Insecurity  . Worried About Charity fundraiser in the Last Year: Never true  . Ran Out of Food in the Last Year: Never true  Transportation Needs: No Transportation Needs  . Lack of Transportation (Medical): No  . Lack of Transportation (Non-Medical): No  Physical Activity: Inactive  . Days of Exercise per Week: 0 days  . Minutes of Exercise per Session: 0 min  Stress: Stress Concern Present  . Feeling of Stress : To some extent  Social Connections: Slightly Isolated  . Frequency of Communication with Friends and Family: More than three times a week  . Frequency of Social Gatherings with  Friends and Family: More than three times a week  . Attends Religious Services: More than 4 times per year  . Active Member of Clubs or Organizations: Yes  . Attends Archivist Meetings: More than 4 times per year  . Marital Status: Widowed  Intimate Partner Violence: Not At Risk  . Fear of Current or Ex-Partner: No  . Emotionally Abused: No  . Physically Abused: No  . Sexually Abused: No    Review of Systems: See HPI, otherwise negative ROS  Physical Exam: BP (!) 121/55   Temp 97.9 F (36.6 C) (Oral)   Resp 11   SpO2 94%  General:   Alert,  Well-developed, well-nourished, pleasant and cooperative in NAD SNeck:  Supple; no masses or thyromegaly. No significant cervical adenopathy. Lungs:  Clear throughout to auscultation.   No wheezes, crackles, or rhonchi. No acute distress. Heart:  Regular rate and rhythm; no murmurs, clicks, rubs,  or gallops. Abdomen: Non-distended, normal bowel sounds.  Soft and nontender without appreciable mass or hepatosplenomegaly.  Pulses:  Normal pulses noted. Extremities:  Without clubbing or edema.  Impression/Plan: 85 year old lady here for a surveillance EGD.  History of multiple gastric and duodenal ulcers.  Not mention above, CTA negative for significant mesenteric  stenosis.  No dysphagia.  I have offered the patient an EGD today per plan.  The risks, benefits, limitations, alternatives and imponderables have been reviewed with the patient. Potential for esophageal dilation, biopsy, etc. have also been reviewed.  Questions have been answered. All parties agreeable.     Notice: This dictation was prepared with Dragon dictation along with smaller phrase technology. Any transcriptional errors that result from this process are unintentional and may not be corrected upon review.

## 2019-11-25 NOTE — Anesthesia Preprocedure Evaluation (Signed)
Anesthesia Evaluation  Patient identified by MRN, date of birth, ID band Patient awake    Reviewed: Allergy & Precautions, H&P , NPO status , Patient's Chart, lab work & pertinent test results, reviewed documented beta blocker date and time   Airway Mallampati: II  TM Distance: >3 FB     Dental  (+) Upper Dentures, Partial Lower   Pulmonary shortness of breath and with exertion, former smoker,    Pulmonary exam normal        Cardiovascular hypertension, Normal cardiovascular exam     Neuro/Psych PSYCHIATRIC DISORDERS Anxiety Depression Dementia  Neuromuscular disease    GI/Hepatic PUD, GERD  Medicated and Controlled,  Endo/Other  diabetes, Type 2  Renal/GU CRFRenal disease     Musculoskeletal   Abdominal   Peds  Hematology  (+) Blood dyscrasia, anemia ,   Anesthesia Other Findings   Reproductive/Obstetrics negative OB ROS                             Anesthesia Physical Anesthesia Plan  ASA: III  Anesthesia Plan: General   Post-op Pain Management:    Induction:   PONV Risk Score and Plan: 2 and Propofol infusion  Airway Management Planned:   Additional Equipment:   Intra-op Plan:   Post-operative Plan:   Informed Consent: I have reviewed the patients History and Physical, chart, labs and discussed the procedure including the risks, benefits and alternatives for the proposed anesthesia with the patient or authorized representative who has indicated his/her understanding and acceptance.       Plan Discussed with: CRNA  Anesthesia Plan Comments:         Anesthesia Quick Evaluation

## 2019-11-25 NOTE — Anesthesia Postprocedure Evaluation (Signed)
Anesthesia Post Note  Patient: Laurie Mcfarland  Procedure(s) Performed: ESOPHAGOGASTRODUODENOSCOPY (EGD) WITH PROPOFOL (N/A )  Patient location during evaluation: PACU Anesthesia Type: General Level of consciousness: awake and alert and patient cooperative Pain management: satisfactory to patient Vital Signs Assessment: post-procedure vital signs reviewed and stable Respiratory status: spontaneous breathing Cardiovascular status: stable Postop Assessment: no apparent nausea or vomiting Anesthetic complications: no     Last Vitals:  Vitals:   11/25/19 0705 11/25/19 0747  BP: (!) 121/55   Pulse:  72  Resp: 11   Temp:  36.6 C  SpO2: 94%     Last Pain:  Vitals:   11/25/19 0732  TempSrc:   PainSc: 0-No pain                 Luis Sami

## 2019-11-26 ENCOUNTER — Other Ambulatory Visit: Payer: Self-pay

## 2019-11-26 LAB — SURGICAL PATHOLOGY

## 2019-11-28 ENCOUNTER — Encounter: Payer: Self-pay | Admitting: Internal Medicine

## 2019-11-29 ENCOUNTER — Encounter: Payer: Self-pay | Admitting: Internal Medicine

## 2019-11-30 ENCOUNTER — Ambulatory Visit: Payer: Medicare Other | Admitting: Orthopaedic Surgery

## 2019-12-02 DIAGNOSIS — G894 Chronic pain syndrome: Secondary | ICD-10-CM | POA: Diagnosis not present

## 2019-12-07 ENCOUNTER — Ambulatory Visit: Payer: Medicare Other | Admitting: Orthopaedic Surgery

## 2019-12-07 ENCOUNTER — Encounter: Payer: Self-pay | Admitting: Orthopaedic Surgery

## 2019-12-09 DIAGNOSIS — L718 Other rosacea: Secondary | ICD-10-CM | POA: Diagnosis not present

## 2019-12-14 DIAGNOSIS — M461 Sacroiliitis, not elsewhere classified: Secondary | ICD-10-CM | POA: Diagnosis not present

## 2019-12-14 DIAGNOSIS — G309 Alzheimer's disease, unspecified: Secondary | ICD-10-CM | POA: Diagnosis not present

## 2019-12-14 DIAGNOSIS — R296 Repeated falls: Secondary | ICD-10-CM | POA: Diagnosis not present

## 2019-12-30 DIAGNOSIS — G894 Chronic pain syndrome: Secondary | ICD-10-CM | POA: Diagnosis not present

## 2020-01-01 NOTE — Progress Notes (Signed)
Referring Provider: [name], MD Primary Care Physician:  [name], MD Primary GI Physician: Dr. Gala Romney  Chief Complaint  Patient presents with  . gastric ulcer    pp fu, doing ok    HPI:   Laurie Mcfarland is a 85 y.o. female presenting today for follow-up s/p EGD.   She was last seen in our office for hospital follow-up on 08/19/19. Patient was admitted from 07/17/2019-07/20/2019 for acute blood loss anemia and melena with admitting hemoglobin of 6.3 requiring 2 units PRBCs with discharge hemoglobin 8.7. Also with iron deficiency. EGD on 07/18/2019 with multiple gastric and duodenal ulcers, mild Schatzki ring in the distal esophagus, irregular Z-line, and LA grade B esophagitis.  Recommendations to repeat endoscopy in 3 months. H. pylori serologies were positive. Due to penicillin allergy, plan to treat with clarithromycin 500 mg twice daily, Flagyl 500 mg 3 times daily, and omeprazole 40 mg twice daily x14 days.  After 14 days, she would continue omeprazole twice daily.   At the time of her OV, she was doing fairly well. Was taking omeprazole BID and oral iron with associated dark stools.  Denied bright red blood per rectum, nausea, vomiting, GERD symptoms, or dysphagia.  Appeared she had 22 pound weight loss in the last month.  Reported chronically low appetite with 3 small meals daily.  Early satiety x6 months that was worsening.  No abdominal pain.  BMs daily.  No prior colonoscopy.  Due to weight loss, would pursue CT A/P with contrast.  Would also update CBC and iron panel and plan to proceed with EGD to follow-up on gastric and duodenal ulcers as well as H. Pylori.   Labs 08/19/2019 with hemoglobin improved to 10.4, iron panel within normal limits but on the low end of normal.  Recommended continuing iron twice daily x3 months then once daily. CT abdomen and pelvis was not completed.  EGD 11/25/2019: Normal esophagus, erosive gastropathy with some crpe appearance much  better than previous EGD, superficial erosions/ulcerations small and scattered in the posterior duodenal bulb and second portion of the duodenum s/p biopsies of gastric and duodenal mucosa.  Pathology with peptic duodenitis with ulceration, chronic active gastritis without H. Pylori.  Today:  Presents with great-grandson.  Gained 14 lbs since December 2020.  Feeling good. No abdominal pain, N/V, GERD symptoms, dysphagia. Having BMs daily. Stools are no longer dark. No diarrhea or constipation. Eating much better. Early satiety has resolved.   No NSAIDs. Used to take Naproxen 2-3 times a day prior to hospitalization.    Past Medical History:  Diagnosis Date  . Anxiety   . Arthritis   . Cataracts, bilateral   . Depression   . Diabetes mellitus   . Diabetic peripheral neuropathy ([place])   . Fibromyalgia   . GERD (gastroesophageal reflux disease)   . Heart burn   . High cholesterol   . Hyperlipidemia   . Hypertension   . Leg swelling   . Shortness of breath    with exertion  . Weight loss   . Wheezing     Past Surgical History:  Procedure Laterality Date  . ABDOMINAL HYSTERECTOMY    . CATARACT EXTRACTION W/PHACO  12/10/2011   Procedure: CATARACT EXTRACTION PHACO AND INTRAOCULAR LENS PLACEMENT (IOC);  Surgeon: Elta Guadeloupe T. Gershon Crane, MD;  Location: [facility];  Service: Ophthalmology;  Laterality: Left;  CDE=10.72  . CATARACT EXTRACTION W/PHACO  01/14/2012   Procedure: CATARACT EXTRACTION PHACO AND INTRAOCULAR LENS PLACEMENT (IOC);  Surgeon:  Mark T. Gershon Crane, MD;  Location: [facility];  Service: Ophthalmology;  Laterality: Right;  CDE:11.63  . ESOPHAGOGASTRODUODENOSCOPY N/A 07/18/2019   Procedure: ESOPHAGOGASTRODUODENOSCOPY (EGD);  Surgeon: Rogene Houston, MD; multiple gastric and duodenal ulcers, mild Schatzki ring in the distal esophagus, LA grade B esophagitis, irregular Z-line.  Repeat endoscopy in 3 months.  . ESOPHAGOGASTRODUODENOSCOPY (EGD) WITH ESOPHAGEAL DILATION N/A 06/17/2013    [id number] esophagus-status post Venia Minks dilation. Hiatal hernia. Gastric erosions s/p bx  . ESOPHAGOGASTRODUODENOSCOPY (EGD) WITH PROPOFOL N/A 11/25/2019   Procedure: ESOPHAGOGASTRODUODENOSCOPY (EGD) WITH PROPOFOL;  Surgeon: Daneil Dolin, MD; Normal esophagus, erosive gastropathy with some crpe appearance much better than previous EGD, superficial erosions/ulcerations small and scattered in the posterior duodenal bulb and second portion of the duodenum.  Gastric biop.-chronic gastritis w/o H. pylori.  Duodenal biop.-peptic duodenitis/ulceration  . foreign body removal     splinter removed from right side of face    Current Outpatient Medications  Medication Sig Dispense Refill  . acetaminophen (TYLENOL) 325 MG tablet Take 2 tablets (650 mg total) by mouth every 6 (six) hours as needed for mild pain or headache (or Fever >/= 101). 60 tablet 0  . albuterol (VENTOLIN HFA) 108 (90 Base) MCG/ACT inhaler Inhale 1-2 puffs into the lungs every 4 (four) hours as needed.    Marland Kitchen amLODipine (NORVASC) 10 MG tablet Take 10 mg by mouth daily.     . busPIRone (BUSPAR) 5 MG tablet Take 5 mg by mouth 2 (two) times daily.    . Cholecalciferol (VITAMIN D-3) 1000 UNITS CAPS Take 1 capsule by mouth 2 (two) times daily.     Marland Kitchen gabapentin (NEURONTIN) 600 MG tablet Take 600 mg by mouth 3 (three) times daily.    Marland Kitchen losartan (COZAAR) 25 MG tablet Take 25 mg by mouth daily.    . memantine (NAMENDA) 5 MG tablet Take 5 mg by mouth 2 (two) times daily.     . metFORMIN (GLUCOPHAGE) 500 MG tablet Take 500 mg by mouth 2 (two) times daily with a meal.     . metroNIDAZOLE (METROCREAM) 0.75 % cream Apply 1 application topically 2 (two) times daily as needed.    . mirtazapine (REMERON) 30 MG tablet Take 30 mg by mouth at bedtime.     . Multiple Vitamin (MULTIVITAMIN WITH MINERALS) TABS tablet Take 1 tablet by mouth daily.    Marland Kitchen omeprazole (PRILOSEC) 40 MG capsule Take 1 capsule (40 mg total) by mouth 2 (two) times daily. 60 capsule 2   . Oxycodone HCl 10 MG TABS Take 10 mg by mouth every 4 (four) hours as needed.    . potassium chloride SA (K-DUR,KLOR-CON) 20 MEQ tablet Take 20 mEq by mouth every morning.     . primidone (MYSOLINE) 50 MG tablet Take 25 mg by mouth at bedtime.    . simvastatin (ZOCOR) 40 MG tablet Take 40 mg by mouth daily.    . traMADol (ULTRAM) 50 MG tablet Take 50 mg by mouth every 4 (four) hours as needed.    . zolpidem (AMBIEN) 5 MG tablet Take 5 mg by mouth at bedtime.     No current facility-administered medications for this visit.    Allergies as of 01/03/2020 - Review Complete 01/03/2020  Allergen Reaction Noted  . Penicillins Rash 06/09/2013    Family History  Problem Relation Age of Onset  . Alzheimer's disease Mother   . Cancer Father   . Diabetes Daughter   . High blood pressure Daughter   . High  blood pressure Daughter   . Cancer Sister   . Cancer Brother   . Pseudochol deficiency Neg Hx   . Malignant hyperthermia Neg Hx   . Hypotension Neg Hx   . Anesthesia problems Neg Hx   . Colon cancer Neg Hx   . Stomach cancer Neg Hx     Social History   Socioeconomic History  . Marital status: Widowed    Spouse name: Not on file  . Number of children: 4  . Years of education: Not on file  . Highest education level: Not on file  Occupational History  . Not on file  Tobacco Use  . Smoking status: Former Smoker    Quit date: 08/30/1994    Years since quitting: 25.3  . Smokeless tobacco: Never Used  Substance and Sexual Activity  . Alcohol use: No    Alcohol/week: 0.0 standard drinks  . Drug use: No  . Sexual activity: Not Currently  Other Topics Concern  . Not on file  Social History Narrative   Adopted granddaughter and raised great grandson.   Pt states that her grandson lives with her sometimes,    Social Determinants of Health   Financial Resource Strain: Low Risk   . Difficulty of Paying Living Expenses: Not hard at all  Food Insecurity: No Food Insecurity  .  Worried About Charity fundraiser in the Last Year: Never true  . Ran Out of Food in the Last Year: Never true  Transportation Needs: No Transportation Needs  . Lack of Transportation (Medical): No  . Lack of Transportation (Non-Medical): No  Physical Activity: Inactive  . Days of Exercise per Week: 0 days  . Minutes of Exercise per Session: 0 min  Stress: Stress Concern Present  . Feeling of Stress : To some extent  Social Connections: Slightly Isolated  . Frequency of Communication with Friends and Family: More than three times a week  . Frequency of Social Gatherings with Friends and Family: More than three times a week  . Attends Religious Services: More than 4 times per year  . Active Member of Clubs or Organizations: Yes  . Attends Archivist Meetings: More than 4 times per year  . Marital Status: Widowed    Review of Systems: Gen: Denies fever, chills, lightheadedness, dizziness, presyncope, syncope. CV: Denies chest pain or heart palpitations.  Heart rate gets up on her left knee is bothering her. Resp: Denies dyspnea at rest or cough GI: See HPI Derm: Denies rash Heme: Denies bruising or bleeding.  Physical Exam: BP (!) 152/78   Pulse (!) 102   Temp (!) 96.8 F (36 C) (Temporal)   Ht 5\' 2"  (1.575 m)   Wt 172 lb 3.2 oz (78.1 kg)   BMI 31.50 kg/m  General:   Alert and oriented. No distress noted. Pleasant and cooperative.  Head:  Normocephalic and atraumatic. Eyes:  Conjuctiva clear without scleral icterus. Heart:  S1, S2 present without murmurs appreciated. Lungs:  Clear to auscultation bilaterally. No wheezes, rales, or rhonchi. No distress.  Abdomen:  +BS, soft, non-tender and non-distended. No rebound or guarding. No HSM or masses noted. Msk:  Symmetrical without gross deformities. Normal posture. Extremities: With 1+ pitting edema in the right lower leg and 2+ pitting edema in the left lower leg.  Patient has discussed this with PCP who has recommended  she continue to monitor for now.  No warmth, erythema, or pain of lower extremities. Neurologic:  Alert and  oriented x4  Psych:  Normal mood and affect.

## 2020-01-03 ENCOUNTER — Encounter: Payer: Self-pay | Admitting: Gastroenterology

## 2020-01-03 ENCOUNTER — Other Ambulatory Visit: Payer: Self-pay

## 2020-01-03 ENCOUNTER — Ambulatory Visit: Payer: Medicare Other | Admitting: Gastroenterology

## 2020-01-03 VITALS — BP 152/78 | HR 102 | Temp 96.8°F | Ht 62.0 in | Wt 172.2 lb

## 2020-01-03 DIAGNOSIS — R634 Abnormal weight loss: Secondary | ICD-10-CM | POA: Diagnosis not present

## 2020-01-03 DIAGNOSIS — K279 Peptic ulcer, site unspecified, unspecified as acute or chronic, without hemorrhage or perforation: Secondary | ICD-10-CM | POA: Diagnosis not present

## 2020-01-03 DIAGNOSIS — K219 Gastro-esophageal reflux disease without esophagitis: Secondary | ICD-10-CM

## 2020-01-03 DIAGNOSIS — D649 Anemia, unspecified: Secondary | ICD-10-CM

## 2020-01-03 NOTE — Assessment & Plan Note (Addendum)
85 year old female who had acute blood loss anemia with melena requiring 2 units PRBCs that was found be secondary to significant PUD with multiple gastric and duodenal ulcers on EGD completed 07/18/2019.  Pathology from EGD was positive for H. pylori and she was treated with clarithromycin, Flagyl, and omeprazole 40 mg twice daily.  Notably, patient was also taking naproxen twice daily chronically which she has also discontinued. Repeat hemoglobin improved to 10.4 (up from 8.7) on 08/19/2019.  Iron panel was within normal limits at that time but on the low end of normal.  She was advised to continue oral iron at that time.  Repeat EGD in 11/25/2019 with normal esophagus, erosive gastropathy with some crpe appearance of much better than previous EGD, superficial erosion/ulceration small and scattered in the posterior duodenal bulb and second portion of duodenum.  Gastric biopsies with active gastritis without H. pylori, duodenal biopsies with duodenitis with ulceration.  She has continued on omeprazole 40 mg twice daily, daily iron and is doing well.  No NSAIDs.  No significant upper or lower GI symptoms.  Dark stools have resolved.  Significant improvement in her weight since last visit.  No repeat labs since December.  Suspect PUD was secondary to H. pylori and chronic NSAID use.  Now that H. pylori has been eradicated and NSAIDs have been discontinued, I suspect her ulcerations/gastritis will continue to improve and anemia will resolve.  Plan to update CBC and iron panel at this time.  If results are within normal limits, will likely discontinue iron.  Otherwise, she is advised to continue omeprazole 40 mg twice daily for now and continue avoiding all NSAIDs.  Follow-up in 4 months.

## 2020-01-03 NOTE — Assessment & Plan Note (Signed)
Well controlled on omeprazole 40 mg twice daily.  No alarm symptoms.  Plan to continue current medications and follow-up in 4 months.

## 2020-01-03 NOTE — Patient Instructions (Addendum)
Please have labs completed at [facility]. We will call with results.   Continue taking omeprazole 40 mg twice daily 30 minutes before breakfast and dinner.   Continue iron for now.   Avoid all NSAIDs. This include naproxen, Advil, Aleve, ibuprofen, and aspirin powders.   Monitor for return of black stools and let me know if this occurs.   We will follow-up in 4 months. Call with questions or concerns prior.   Aliene Altes, PA-C [facility]

## 2020-01-03 NOTE — Assessment & Plan Note (Addendum)
Resolved/improved.  It appears patient had unintentional weight loss of 22 pounds between November and December 2020 with associated decreased appetite and early satiety.  She had recently been diagnosed with multiple gastric ulcers, duodenal ulcers, and H. pylori on EGD completed 07/18/2019.  H. pylori was treated and she has continued on omeprazole 40 mg twice daily.  Notably, patient had also been taking naproxen twice daily chronically but has since discontinued all NSAIDs.  Repeat EGD 11/25/2019 with  Normal esophagus, erosive gastropathy with some crpe appearance but much better than previous EGD, superficial erosions/ulcerations small and scattered in the posterior duodenal bulb and second portion of the duodenum.  Gastric biopsies with chronic active gastritis without H. pylori, duodenal biopsies with duodenitis with ulceration.  Clinically, patient is feeling much improved.  No significant upper or lower GI symptoms.  Early satiety has resolved.  Appetite improved.  She has gained 14 pounds since December 2020.   Suspect weight loss/early satiety was secondary to PUD and H. Pylori.  Continue omeprazole 40 mg twice daily 30 minutes before breakfast and dinner. Continue to avoid all NSAIDs. Continue to monitor. Follow-up in 4 months.

## 2020-01-03 NOTE — Assessment & Plan Note (Addendum)
History of acute blood loss anemia secondary to peptic ulcer disease in November 2020.  EGD with multiple gastric and duodenal ulcers, LA grade B esophagitis.  H. pylori serologies were positive.  Notably, she was also taking naproxen 2-3 times daily.  H. pylori was treated with clarithromycin, Flagyl, and omeprazole twice daily.  She has remained on omeprazole 40 mg twice daily.  Repeat EGD 11/25/2019 with normal esophagus, erosive gastropathy with some crpe appearance but much better than previous EGD, superficial erosion/ulcerations small and scattered in the posterior duodenal bulb and second portion of duodenum.  Gastric biopsies revealed chronic active gastritis without H. pylori, duodenal biopsies with peptic duodenitis with ulceration.  Today she reports she is doing well on omeprazole 40 mg twice daily.  No NSAIDs.  Denies any significant upper or lower GI symptoms.  Dark/black stools have now resolved.  Suspect PUD is likely secondary to H pylori as well as chronic NSAID use.  As H. pylori has been eradicated and NSAIDs have been discontinued, I suspect her duodenal ulcers and gastritis will continue to improve.  Continue omeprazole 40 mg 30 minutes before breakfast and dinner. Continue avoiding all NSAIDs. Update CBC and iron panel.  If results are within normal limits, will likely discontinue iron. Advised patient to monitor for return of melanotic stools. Plan follow-up in 4 months.  Call with questions or concerns prior.

## 2020-01-04 ENCOUNTER — Ambulatory Visit: Payer: Medicare Other

## 2020-01-04 ENCOUNTER — Encounter: Payer: Self-pay | Admitting: Orthopaedic Surgery

## 2020-01-04 ENCOUNTER — Ambulatory Visit (INDEPENDENT_AMBULATORY_CARE_PROVIDER_SITE_OTHER): Payer: Medicare Other | Admitting: Orthopaedic Surgery

## 2020-01-04 VITALS — BP 158/75 | HR 100 | Ht 62.0 in | Wt 173.0 lb

## 2020-01-04 DIAGNOSIS — G8929 Other chronic pain: Secondary | ICD-10-CM

## 2020-01-04 DIAGNOSIS — M25561 Pain in right knee: Secondary | ICD-10-CM

## 2020-01-04 DIAGNOSIS — M25562 Pain in left knee: Secondary | ICD-10-CM

## 2020-01-04 DIAGNOSIS — D649 Anemia, unspecified: Secondary | ICD-10-CM | POA: Diagnosis not present

## 2020-01-04 DIAGNOSIS — M25511 Pain in right shoulder: Secondary | ICD-10-CM | POA: Diagnosis not present

## 2020-01-04 NOTE — Progress Notes (Signed)
Patient DX:[id number] Laurie Mcfarland, female DOB:03/04/1935, 85 y.o. [id number]  Chief Complaint  Patient presents with  . Shoulder Pain    right   . Knee Pain    left     HPI  Laurie Mcfarland is a 85 y.o. female who has developed pain in the left knee and right shoulder.  She has no trauma.  The left knee has popping, swelling and medial pain.  She has no giving way.  She has no trauma.  It bothers her all the time and nothing seems to help.  The right shoulder hurts with overhead use and laying on it at night.  She has no numbness, no trauma, no swelling.   Body mass index is 31.64 kg/m.  ROS  Review of Systems  Constitutional: Positive for activity change.  Musculoskeletal: Positive for gait problem and joint swelling.  Psychiatric/Behavioral: The patient is nervous/anxious.   All other systems reviewed and are negative.   All other systems reviewed and are negative.  The following is a summary of the past history medically, past history surgically, known current medicines, social history and family history.  This information is gathered electronically by the computer from prior information and documentation.  I review this each visit and have found including this information at this point in the chart is beneficial and informative.    Past Medical History:  Diagnosis Date  . Anxiety   . Arthritis   . Cataracts, bilateral   . Depression   . Diabetes mellitus   . Diabetic peripheral neuropathy ([place])   . Fibromyalgia   . GERD (gastroesophageal reflux disease)   . Heart burn   . High cholesterol   . Hyperlipidemia   . Hypertension   . Leg swelling   . Shortness of breath    with exertion  . Weight loss   . Wheezing     Past Surgical History:  Procedure Laterality Date  . ABDOMINAL HYSTERECTOMY    . CATARACT EXTRACTION W/PHACO  12/10/2011   Procedure: CATARACT EXTRACTION PHACO AND INTRAOCULAR LENS PLACEMENT (IOC);  Surgeon: Elta Guadeloupe T. Gershon Crane, MD;  Location: [facility];  Service:  Ophthalmology;  Laterality: Left;  CDE=10.72  . CATARACT EXTRACTION W/PHACO  01/14/2012   Procedure: CATARACT EXTRACTION PHACO AND INTRAOCULAR LENS PLACEMENT (IOC);  Surgeon: Elta Guadeloupe T. Gershon Crane, MD;  Location: [facility];  Service: Ophthalmology;  Laterality: Right;  CDE:11.63  . ESOPHAGOGASTRODUODENOSCOPY N/A 07/18/2019   Procedure: ESOPHAGOGASTRODUODENOSCOPY (EGD);  Surgeon: Rogene Houston, MD; multiple gastric and duodenal ulcers, mild Schatzki ring in the distal esophagus, LA grade B esophagitis, irregular Z-line.  Repeat endoscopy in 3 months.  . ESOPHAGOGASTRODUODENOSCOPY (EGD) WITH ESOPHAGEAL DILATION N/A 06/17/2013   [id number] esophagus-status post Venia Minks dilation. Hiatal hernia. Gastric erosions s/p bx  . ESOPHAGOGASTRODUODENOSCOPY (EGD) WITH PROPOFOL N/A 11/25/2019   Procedure: ESOPHAGOGASTRODUODENOSCOPY (EGD) WITH PROPOFOL;  Surgeon: Daneil Dolin, MD; Normal esophagus, erosive gastropathy with some crpe appearance much better than previous EGD, superficial erosions/ulcerations small and scattered in the posterior duodenal bulb and second portion of the duodenum.  Gastric biop.-chronic gastritis w/o H. pylori.  Duodenal biop.-peptic duodenitis/ulceration  . foreign body removal     splinter removed from right side of face    Family History  Problem Relation Age of Onset  . Alzheimer's disease Mother   . Cancer Father   . Diabetes Daughter   . High blood pressure Daughter   . High blood pressure Daughter   . Cancer Sister   . Cancer Brother   .  Pseudochol deficiency Neg Hx   . Malignant hyperthermia Neg Hx   . Hypotension Neg Hx   . Anesthesia problems Neg Hx   . Colon cancer Neg Hx   . Stomach cancer Neg Hx     Social History Social History   Tobacco Use  . Smoking status: Former Smoker    Quit date: 08/30/1994    Years since quitting: 25.3  . Smokeless tobacco: Never Used  Substance Use Topics  . Alcohol use: No    Alcohol/week: 0.0 standard drinks  . Drug use: No     Allergies  Allergen Reactions  . Penicillins Rash    Has patient had a PCN reaction causing immediate rash, facial/tongue/throat swelling, SOB or lightheadedness with hypotension: No Has patient had a PCN reaction causing severe rash involving mucus membranes or skin necrosis: No Has patient had a PCN reaction that required hospitalization: No Has patient had a PCN reaction occurring within the last 10 years: No If all of the above answers are "NO", then may proceed with Cephalosporin use.     Current Outpatient Medications  Medication Sig Dispense Refill  . acetaminophen (TYLENOL) 325 MG tablet Take 2 tablets (650 mg total) by mouth every 6 (six) hours as needed for mild pain or headache (or Fever >/= 101). 60 tablet 0  . albuterol (VENTOLIN HFA) 108 (90 Base) MCG/ACT inhaler Inhale 1-2 puffs into the lungs every 4 (four) hours as needed.    Marland Kitchen amLODipine (NORVASC) 10 MG tablet Take 10 mg by mouth daily.     Marland Kitchen apixaban (ELIQUIS) 5 MG TABS tablet Eliquis 5 mg tablet  Take 1 tablet twice a day by oral route.    . busPIRone (BUSPAR) 5 MG tablet Take 5 mg by mouth 2 (two) times daily.    . Cholecalciferol (VITAMIN D-3) 1000 UNITS CAPS Take 1 capsule by mouth 2 (two) times daily.     Marland Kitchen gabapentin (NEURONTIN) 600 MG tablet Take 600 mg by mouth 3 (three) times daily.    Marland Kitchen losartan (COZAAR) 25 MG tablet Take 25 mg by mouth daily.    . memantine (NAMENDA) 5 MG tablet Take 5 mg by mouth 2 (two) times daily.     . metFORMIN (GLUCOPHAGE) 500 MG tablet Take 500 mg by mouth 2 (two) times daily with a meal.     . metroNIDAZOLE (METROCREAM) 0.75 % cream Apply 1 application topically 2 (two) times daily as needed.    . mirtazapine (REMERON) 30 MG tablet Take 30 mg by mouth at bedtime.     . Multiple Vitamin (MULTIVITAMIN WITH MINERALS) TABS tablet Take 1 tablet by mouth daily.    Marland Kitchen omeprazole (PRILOSEC) 40 MG capsule Take 1 capsule (40 mg total) by mouth 2 (two) times daily. 60 capsule 2  .  Oxycodone HCl 10 MG TABS Take 10 mg by mouth every 4 (four) hours as needed.    . potassium chloride SA (K-DUR,KLOR-CON) 20 MEQ tablet Take 20 mEq by mouth every morning.     . primidone (MYSOLINE) 50 MG tablet Take 25 mg by mouth at bedtime.    . simvastatin (ZOCOR) 40 MG tablet Take 40 mg by mouth daily.    . traMADol (ULTRAM) 50 MG tablet Take 50 mg by mouth every 4 (four) hours as needed.    . zolpidem (AMBIEN) 5 MG tablet Take 5 mg by mouth at bedtime.     No current facility-administered medications for this visit.     Physical Exam  Blood pressure (!) 158/75, pulse 100, height 5\' 2"  (1.575 m), weight 173 lb (78.5 kg).  Constitutional: overall normal hygiene, normal nutrition, well developed, normal grooming, normal body habitus. Assistive device:none  Musculoskeletal: gait and station Limp left, muscle tone and strength are normal, no tremors or atrophy is present.  .  Neurological: coordination overall normal.  Deep tendon reflex/nerve stretch intact.  Sensation normal.  Cranial nerves II-XII intact.   Skin:   Normal overall no scars, lesions, ulcers or rashes. No psoriasis.  Psychiatric: Alert and oriented x 3.  Recent memory intact, remote memory unclear.  Normal mood and affect. Well groomed.  Good eye contact.  Cardiovascular: overall no swelling, no varicosities, no edema bilaterally, normal temperatures of the legs and arms, no clubbing, cyanosis and good capillary refill.  Lymphatic: palpation is normal.  Left knee with ROM 0 to 100 with medial pain, effusion, crepitus, positive medial McMurray, limp left.  NV intact.  Examination of right Upper Extremity is done.  Inspection:   Overall:  Elbow non-tender without crepitus or defects, forearm non-tender without crepitus or defects, wrist non-tender without crepitus or defects, hand non-tender.    Shoulder: with glenohumeral joint tenderness, without effusion.   Upper arm:  without swelling and tenderness   Range of  motion:   Overall:  Full range of motion of the elbow, full range of motion of wrist and full range of motion in fingers.   Shoulder:  right  120 degrees forward flexion; 100 degrees abduction; 20 degrees internal rotation, 20 degrees external rotation, 10 degrees extension, 35 degrees adduction.   Stability:   Overall:  Shoulder, elbow and wrist stable   Strength and Tone:   Overall full shoulder muscles strength, full upper arm strength and normal upper arm bulk and tone.  All other systems reviewed and are negative   The patient has been educated about the nature of the problem(s) and counseled on treatment options.  The patient appeared to understand what I have discussed and is in agreement with it.  X-rays were done of the right shoulder and left knee, reported separately.  She has severe DJD of the left knee.  Encounter Diagnoses  Name Primary?  . Chronic pain of left knee Yes  . Chronic pain in right shoulder   . Chronic pain of right knee    PROCEDURE NOTE:  The patient requests injections of the left knee , verbal consent was obtained.  The left knee was prepped appropriately after time out was performed.   Sterile technique was observed and injection of 1 cc of Depo-Medrol 40 mg with several cc's of plain xylocaine. Anesthesia was provided by ethyl chloride and a 20-gauge needle was used to inject the knee area. The injection was tolerated well.  A band aid dressing was applied.  The patient was advised to apply ice later today and tomorrow to the injection sight as needed.  PROCEDURE NOTE:  The patient requests injections of the right knee , verbal consent was obtained.  The right knee was prepped appropriately after time out was performed.   Sterile technique was observed and injection of 1 cc of Depo-Medrol 40 mg with several cc's of plain xylocaine. Anesthesia was provided by ethyl chloride and a 20-gauge needle was used to inject the knee area. The injection was  tolerated well.  A band aid dressing was applied.  The patient was advised to apply ice later today and tomorrow to the injection sight as needed.   PLAN  Call if any problems.  Precautions discussed.  Continue current medications.   Return to clinic 1 month   Electronically Signed Sanjuana Kava, MD 5/4/20212:16 PM

## 2020-01-05 LAB — CBC WITH DIFFERENTIAL/PLATELET
Absolute Monocytes: 479 cells/uL (ref 200–950)
Basophils Absolute: 19 cells/uL (ref 0–200)
Basophils Relative: 0.3 %
Eosinophils Absolute: 38 cells/uL (ref 15–500)
Eosinophils Relative: 0.6 %
HCT: 32.5 % — ABNORMAL LOW (ref 35.0–45.0)
Hemoglobin: 9.8 g/dL — ABNORMAL LOW (ref 11.7–15.5)
Lymphs Abs: 2583 cells/uL (ref 850–3900)
MCH: 23.6 pg — ABNORMAL LOW (ref 27.0–33.0)
MCHC: 30.2 g/dL — ABNORMAL LOW (ref 32.0–36.0)
MCV: 78.3 fL — ABNORMAL LOW (ref 80.0–100.0)
MPV: 9.4 fL (ref 7.5–12.5)
Monocytes Relative: 7.6 %
Neutro Abs: 3182 cells/uL (ref 1500–7800)
Neutrophils Relative %: 50.5 %
Platelets: 258 10*3/uL (ref 140–400)
RBC: 4.15 10*6/uL (ref 3.80–5.10)
RDW: 18.5 % — ABNORMAL HIGH (ref 11.0–15.0)
Total Lymphocyte: 41 %
WBC: 6.3 10*3/uL (ref 3.8–10.8)

## 2020-01-05 LAB — IRON,TIBC AND FERRITIN PANEL
%SAT: 6 % (calc) — ABNORMAL LOW (ref 16–45)
Ferritin: 6 ng/mL — ABNORMAL LOW (ref 16–288)
Iron: 26 ug/dL — ABNORMAL LOW (ref 45–160)
TIBC: 403 mcg/dL (calc) (ref 250–450)

## 2020-01-05 NOTE — Progress Notes (Signed)
Hemoglobin is trending down again.  Hemoglobin is 9.8, down from 10.4, 4 months ago.  Iron panel has also declined.  Ferritin 6, down from 18.  Iron 26, down from 86.    As patient's upper GI symptoms had significantly improved when I saw her at her office visit, and she is now having a decline in her hemoglobin again, we are going to need to evaluate her lower GI tract with a colonoscopy as she has never had one.  She will also need to increase iron tablet back to twice a day for now.   Tried to call patient to discuss results/recommendations. Her friend answered and asked that we call back tomorrow as Laurie Mcfarland was not home.

## 2020-01-06 ENCOUNTER — Telehealth: Payer: Self-pay | Admitting: Internal Medicine

## 2020-01-06 NOTE — Telephone Encounter (Signed)
Called pt back and discussed results.

## 2020-01-06 NOTE — Telephone Encounter (Signed)
Patient l/m that someone from office called her yesterday, please call back

## 2020-01-14 DIAGNOSIS — M461 Sacroiliitis, not elsewhere classified: Secondary | ICD-10-CM | POA: Diagnosis not present

## 2020-01-14 DIAGNOSIS — M545 Low back pain: Secondary | ICD-10-CM | POA: Diagnosis not present

## 2020-02-01 ENCOUNTER — Encounter: Payer: Self-pay | Admitting: Orthopaedic Surgery

## 2020-02-01 ENCOUNTER — Ambulatory Visit: Payer: Medicare Other | Admitting: Orthopaedic Surgery

## 2020-02-02 DIAGNOSIS — I10 Essential (primary) hypertension: Secondary | ICD-10-CM | POA: Diagnosis not present

## 2020-02-02 DIAGNOSIS — G894 Chronic pain syndrome: Secondary | ICD-10-CM | POA: Diagnosis not present

## 2020-02-02 DIAGNOSIS — K219 Gastro-esophageal reflux disease without esophagitis: Secondary | ICD-10-CM | POA: Diagnosis not present

## 2020-02-24 ENCOUNTER — Other Ambulatory Visit: Payer: Self-pay

## 2020-02-24 ENCOUNTER — Ambulatory Visit (INDEPENDENT_AMBULATORY_CARE_PROVIDER_SITE_OTHER): Payer: Medicare Other | Admitting: Orthopaedic Surgery

## 2020-02-24 ENCOUNTER — Encounter: Payer: Self-pay | Admitting: Orthopaedic Surgery

## 2020-02-24 DIAGNOSIS — M25511 Pain in right shoulder: Secondary | ICD-10-CM | POA: Diagnosis not present

## 2020-02-24 DIAGNOSIS — M25562 Pain in left knee: Secondary | ICD-10-CM

## 2020-02-24 DIAGNOSIS — G8929 Other chronic pain: Secondary | ICD-10-CM | POA: Diagnosis not present

## 2020-02-24 NOTE — Progress Notes (Signed)
PROCEDURE NOTE:  The patient request injection, verbal consent was obtained.  The right shoulder was prepped appropriately after time out was performed.   Sterile technique was observed and injection of 1 cc of Depo-Medrol 40 mg with several cc's of plain xylocaine. Anesthesia was provided by ethyl chloride and a 20-gauge needle was used to inject the shoulder area. A posterior approach was used.  The injection was tolerated well.  A band aid dressing was applied.  The patient was advised to apply ice later today and tomorrow to the injection sight as needed.   PROCEDURE NOTE:  The patient requests injections of the left knee , verbal consent was obtained.  The left knee was prepped appropriately after time out was performed.   Sterile technique was observed and injection of 1 cc of Depo-Medrol 40 mg with several cc's of plain xylocaine. Anesthesia was provided by ethyl chloride and a 20-gauge needle was used to inject the knee area. The injection was tolerated well.  A band aid dressing was applied.  The patient was advised to apply ice later today and tomorrow to the injection sight as needed.  Return in six weeks.  Forms for rest home completed.  Electronically Signed Sanjuana Kava, MD 6/24/202110:22 AM

## 2020-03-01 DIAGNOSIS — G894 Chronic pain syndrome: Secondary | ICD-10-CM | POA: Diagnosis not present

## 2020-03-19 ENCOUNTER — Inpatient Hospital Stay (HOSPITAL_COMMUNITY)
Admission: EM | Admit: 2020-03-19 | Discharge: 2020-03-28 | DRG: 336 | Disposition: A | Discharge status: Skilled Nursing Facility | Payer: Medicare Other | Attending: Internal Medicine | Admitting: Internal Medicine

## 2020-03-19 ENCOUNTER — Other Ambulatory Visit: Payer: Self-pay

## 2020-03-19 ENCOUNTER — Emergency Department (HOSPITAL_COMMUNITY): Payer: Medicare Other

## 2020-03-19 ENCOUNTER — Encounter (HOSPITAL_COMMUNITY): Payer: Self-pay | Admitting: *Deleted

## 2020-03-19 DIAGNOSIS — Z961 Presence of intraocular lens: Secondary | ICD-10-CM | POA: Diagnosis present

## 2020-03-19 DIAGNOSIS — G4700 Insomnia, unspecified: Secondary | ICD-10-CM | POA: Diagnosis not present

## 2020-03-19 DIAGNOSIS — E669 Obesity, unspecified: Secondary | ICD-10-CM | POA: Diagnosis present

## 2020-03-19 DIAGNOSIS — R079 Chest pain, unspecified: Secondary | ICD-10-CM | POA: Diagnosis not present

## 2020-03-19 DIAGNOSIS — Z90710 Acquired absence of both cervix and uterus: Secondary | ICD-10-CM | POA: Diagnosis not present

## 2020-03-19 DIAGNOSIS — K56699 Other intestinal obstruction unspecified as to partial versus complete obstruction: Secondary | ICD-10-CM

## 2020-03-19 DIAGNOSIS — M549 Dorsalgia, unspecified: Secondary | ICD-10-CM | POA: Diagnosis not present

## 2020-03-19 DIAGNOSIS — K5651 Intestinal adhesions [bands], with partial obstruction: Secondary | ICD-10-CM | POA: Diagnosis not present

## 2020-03-19 DIAGNOSIS — N179 Acute kidney failure, unspecified: Secondary | ICD-10-CM | POA: Diagnosis present

## 2020-03-19 DIAGNOSIS — K2100 Gastro-esophageal reflux disease with esophagitis, without bleeding: Secondary | ICD-10-CM | POA: Diagnosis present

## 2020-03-19 DIAGNOSIS — Z9842 Cataract extraction status, left eye: Secondary | ICD-10-CM

## 2020-03-19 DIAGNOSIS — F419 Anxiety disorder, unspecified: Secondary | ICD-10-CM | POA: Diagnosis present

## 2020-03-19 DIAGNOSIS — Z86711 Personal history of pulmonary embolism: Secondary | ICD-10-CM

## 2020-03-19 DIAGNOSIS — Z791 Long term (current) use of non-steroidal anti-inflammatories (NSAID): Secondary | ICD-10-CM

## 2020-03-19 DIAGNOSIS — E1142 Type 2 diabetes mellitus with diabetic polyneuropathy: Secondary | ICD-10-CM | POA: Diagnosis not present

## 2020-03-19 DIAGNOSIS — M159 Polyosteoarthritis, unspecified: Secondary | ICD-10-CM | POA: Diagnosis not present

## 2020-03-19 DIAGNOSIS — E7800 Pure hypercholesterolemia, unspecified: Secondary | ICD-10-CM | POA: Diagnosis present

## 2020-03-19 DIAGNOSIS — I129 Hypertensive chronic kidney disease with stage 1 through stage 4 chronic kidney disease, or unspecified chronic kidney disease: Secondary | ICD-10-CM | POA: Diagnosis not present

## 2020-03-19 DIAGNOSIS — Z9841 Cataract extraction status, right eye: Secondary | ICD-10-CM

## 2020-03-19 DIAGNOSIS — G8929 Other chronic pain: Secondary | ICD-10-CM | POA: Diagnosis present

## 2020-03-19 DIAGNOSIS — Z79899 Other long term (current) drug therapy: Secondary | ICD-10-CM

## 2020-03-19 DIAGNOSIS — N1831 Chronic kidney disease, stage 3a: Secondary | ICD-10-CM | POA: Diagnosis present

## 2020-03-19 DIAGNOSIS — F329 Major depressive disorder, single episode, unspecified: Secondary | ICD-10-CM | POA: Diagnosis present

## 2020-03-19 DIAGNOSIS — M797 Fibromyalgia: Secondary | ICD-10-CM | POA: Diagnosis not present

## 2020-03-19 DIAGNOSIS — Z7984 Long term (current) use of oral hypoglycemic drugs: Secondary | ICD-10-CM

## 2020-03-19 DIAGNOSIS — E785 Hyperlipidemia, unspecified: Secondary | ICD-10-CM | POA: Diagnosis present

## 2020-03-19 DIAGNOSIS — Z87891 Personal history of nicotine dependence: Secondary | ICD-10-CM | POA: Diagnosis not present

## 2020-03-19 DIAGNOSIS — K5939 Other megacolon: Secondary | ICD-10-CM | POA: Diagnosis not present

## 2020-03-19 DIAGNOSIS — R52 Pain, unspecified: Secondary | ICD-10-CM | POA: Diagnosis not present

## 2020-03-19 DIAGNOSIS — E1122 Type 2 diabetes mellitus with diabetic chronic kidney disease: Secondary | ICD-10-CM | POA: Diagnosis present

## 2020-03-19 DIAGNOSIS — Z48815 Encounter for surgical aftercare following surgery on the digestive system: Secondary | ICD-10-CM | POA: Diagnosis not present

## 2020-03-19 DIAGNOSIS — K56609 Unspecified intestinal obstruction, unspecified as to partial versus complete obstruction: Secondary | ICD-10-CM | POA: Diagnosis not present

## 2020-03-19 DIAGNOSIS — Z880 Allergy status to penicillin: Secondary | ICD-10-CM

## 2020-03-19 DIAGNOSIS — R000 Tachycardia, unspecified: Secondary | ICD-10-CM | POA: Diagnosis not present

## 2020-03-19 DIAGNOSIS — R6889 Other general symptoms and signs: Secondary | ICD-10-CM | POA: Diagnosis not present

## 2020-03-19 DIAGNOSIS — I700 Atherosclerosis of aorta: Secondary | ICD-10-CM | POA: Diagnosis not present

## 2020-03-19 DIAGNOSIS — N189 Chronic kidney disease, unspecified: Secondary | ICD-10-CM | POA: Diagnosis not present

## 2020-03-19 DIAGNOSIS — Z820 Family history of epilepsy and other diseases of the nervous system: Secondary | ICD-10-CM

## 2020-03-19 DIAGNOSIS — F0391 Unspecified dementia with behavioral disturbance: Secondary | ICD-10-CM | POA: Diagnosis not present

## 2020-03-19 DIAGNOSIS — Z0189 Encounter for other specified special examinations: Secondary | ICD-10-CM

## 2020-03-19 DIAGNOSIS — R1111 Vomiting without nausea: Secondary | ICD-10-CM | POA: Diagnosis not present

## 2020-03-19 DIAGNOSIS — I1310 Hypertensive heart and chronic kidney disease without heart failure, with stage 1 through stage 4 chronic kidney disease, or unspecified chronic kidney disease: Secondary | ICD-10-CM | POA: Diagnosis not present

## 2020-03-19 DIAGNOSIS — K219 Gastro-esophageal reflux disease without esophagitis: Secondary | ICD-10-CM | POA: Diagnosis not present

## 2020-03-19 DIAGNOSIS — Z8711 Personal history of peptic ulcer disease: Secondary | ICD-10-CM | POA: Diagnosis not present

## 2020-03-19 DIAGNOSIS — Z8619 Personal history of other infectious and parasitic diseases: Secondary | ICD-10-CM

## 2020-03-19 DIAGNOSIS — I499 Cardiac arrhythmia, unspecified: Secondary | ICD-10-CM | POA: Diagnosis present

## 2020-03-19 DIAGNOSIS — I959 Hypotension, unspecified: Secondary | ICD-10-CM | POA: Diagnosis not present

## 2020-03-19 DIAGNOSIS — Z6833 Body mass index (BMI) 33.0-33.9, adult: Secondary | ICD-10-CM

## 2020-03-19 DIAGNOSIS — Z741 Need for assistance with personal care: Secondary | ICD-10-CM | POA: Diagnosis not present

## 2020-03-19 DIAGNOSIS — I10 Essential (primary) hypertension: Secondary | ICD-10-CM | POA: Diagnosis not present

## 2020-03-19 DIAGNOSIS — Z20822 Contact with and (suspected) exposure to covid-19: Secondary | ICD-10-CM | POA: Diagnosis present

## 2020-03-19 DIAGNOSIS — Z743 Need for continuous supervision: Secondary | ICD-10-CM | POA: Diagnosis not present

## 2020-03-19 DIAGNOSIS — Z7901 Long term (current) use of anticoagulants: Secondary | ICD-10-CM

## 2020-03-19 DIAGNOSIS — Z833 Family history of diabetes mellitus: Secondary | ICD-10-CM

## 2020-03-19 DIAGNOSIS — M6281 Muscle weakness (generalized): Secondary | ICD-10-CM | POA: Diagnosis not present

## 2020-03-19 DIAGNOSIS — R262 Difficulty in walking, not elsewhere classified: Secondary | ICD-10-CM | POA: Diagnosis not present

## 2020-03-19 DIAGNOSIS — R0689 Other abnormalities of breathing: Secondary | ICD-10-CM | POA: Diagnosis not present

## 2020-03-19 DIAGNOSIS — E1143 Type 2 diabetes mellitus with diabetic autonomic (poly)neuropathy: Secondary | ICD-10-CM | POA: Diagnosis not present

## 2020-03-19 DIAGNOSIS — K6389 Other specified diseases of intestine: Secondary | ICD-10-CM | POA: Diagnosis not present

## 2020-03-19 DIAGNOSIS — F03918 Unspecified dementia, unspecified severity, with other behavioral disturbance: Secondary | ICD-10-CM

## 2020-03-19 DIAGNOSIS — R1030 Lower abdominal pain, unspecified: Secondary | ICD-10-CM | POA: Diagnosis not present

## 2020-03-19 LAB — COMPREHENSIVE METABOLIC PANEL
ALT: 18 U/L (ref 0–44)
AST: 25 U/L (ref 15–41)
Albumin: 4 g/dL (ref 3.5–5.0)
Alkaline Phosphatase: 78 U/L (ref 38–126)
Anion gap: 14 (ref 5–15)
BUN: 36 mg/dL — ABNORMAL HIGH (ref 8–23)
CO2: 28 mmol/L (ref 22–32)
Calcium: 9.4 mg/dL (ref 8.9–10.3)
Chloride: 94 mmol/L — ABNORMAL LOW (ref 98–111)
Creatinine, Ser: 1.65 mg/dL — ABNORMAL HIGH (ref 0.44–1.00)
GFR calc Af Amer: 33 mL/min — ABNORMAL LOW (ref >60)
GFR calc non Af Amer: 28 mL/min — ABNORMAL LOW (ref >60)
Glucose, Bld: 137 mg/dL — ABNORMAL HIGH (ref 70–99)
Potassium: 4 mmol/L (ref 3.5–5.1)
Sodium: 136 mmol/L (ref 135–145)
Total Bilirubin: 0.5 mg/dL (ref 0.3–1.2)
Total Protein: 7.9 g/dL (ref 6.5–8.1)

## 2020-03-19 LAB — CBC
HCT: 38.8 % (ref 36.0–46.0)
Hemoglobin: 12 g/dL (ref 12.0–15.0)
MCH: 25.2 pg — ABNORMAL LOW (ref 26.0–34.0)
MCHC: 30.9 g/dL (ref 30.0–36.0)
MCV: 81.3 fL (ref 80.0–100.0)
Platelets: 273 10*3/uL (ref 150–400)
RBC: 4.77 MIL/uL (ref 3.87–5.11)
RDW: 18.6 % — ABNORMAL HIGH (ref 11.5–15.5)
WBC: 11.8 10*3/uL — ABNORMAL HIGH (ref 4.0–10.5)
nRBC: 0 % (ref 0.0–0.2)

## 2020-03-19 LAB — GLUCOSE, CAPILLARY
Glucose-Capillary: 101 mg/dL — ABNORMAL HIGH (ref 70–99)
Glucose-Capillary: 126 mg/dL — ABNORMAL HIGH (ref 70–99)
Glucose-Capillary: 146 mg/dL — ABNORMAL HIGH (ref 70–99)

## 2020-03-19 LAB — LIPASE, BLOOD: Lipase: 33 U/L (ref 11–51)

## 2020-03-19 LAB — PROTIME-INR
INR: 1.3 — ABNORMAL HIGH (ref 0.8–1.2)
Prothrombin Time: 15.4 seconds — ABNORMAL HIGH (ref 11.4–15.2)

## 2020-03-19 LAB — SARS CORONAVIRUS 2 BY RT PCR (HOSPITAL ORDER, PERFORMED IN ~~LOC~~ HOSPITAL LAB): SARS Coronavirus 2: NEGATIVE

## 2020-03-19 MED ORDER — SODIUM CHLORIDE 0.9 % IV BOLUS
1000.0000 mL | Freq: Once | INTRAVENOUS | Status: AC
  Administered 2020-03-19: 1000 mL via INTRAVENOUS

## 2020-03-19 MED ORDER — BUSPIRONE HCL 5 MG PO TABS
10.0000 mg | ORAL_TABLET | Freq: Two times a day (BID) | ORAL | Status: DC
  Administered 2020-03-19 – 2020-03-21 (×5): 10 mg via ORAL
  Filled 2020-03-19 (×5): qty 2

## 2020-03-19 MED ORDER — MIRTAZAPINE 30 MG PO TABS
30.0000 mg | ORAL_TABLET | Freq: Every day | ORAL | Status: DC
  Administered 2020-03-19 – 2020-03-21 (×3): 30 mg via ORAL
  Filled 2020-03-19 (×3): qty 1

## 2020-03-19 MED ORDER — ACETAMINOPHEN 325 MG PO TABS
650.0000 mg | ORAL_TABLET | Freq: Four times a day (QID) | ORAL | Status: DC | PRN
  Administered 2020-03-23 – 2020-03-28 (×9): 650 mg via ORAL
  Filled 2020-03-19 (×12): qty 2

## 2020-03-19 MED ORDER — ALPRAZOLAM 0.5 MG PO TABS
0.5000 mg | ORAL_TABLET | Freq: Three times a day (TID) | ORAL | Status: DC | PRN
  Administered 2020-03-20: 0.5 mg via ORAL
  Filled 2020-03-19: qty 1

## 2020-03-19 MED ORDER — GABAPENTIN 300 MG PO CAPS
600.0000 mg | ORAL_CAPSULE | Freq: Three times a day (TID) | ORAL | Status: DC
  Administered 2020-03-19 – 2020-03-21 (×6): 600 mg via ORAL
  Filled 2020-03-19 (×7): qty 2

## 2020-03-19 MED ORDER — GABAPENTIN 600 MG PO TABS
600.0000 mg | ORAL_TABLET | Freq: Three times a day (TID) | ORAL | Status: DC
  Filled 2020-03-19 (×2): qty 1

## 2020-03-19 MED ORDER — PANTOPRAZOLE SODIUM 40 MG PO TBEC: 40.0000 mg | DELAYED_RELEASE_TABLET | Freq: Two times a day (BID) | ORAL | Status: DC

## 2020-03-19 MED ORDER — TRAMADOL HCL 50 MG PO TABS
50.0000 mg | ORAL_TABLET | ORAL | Status: DC | PRN
  Administered 2020-03-20 (×2): 50 mg via ORAL
  Filled 2020-03-19 (×2): qty 1

## 2020-03-19 MED ORDER — ONDANSETRON HCL 4 MG PO TABS: 4.0000 mg | ORAL_TABLET | Freq: Four times a day (QID) | ORAL | Status: DC | PRN

## 2020-03-19 MED ORDER — PANTOPRAZOLE SODIUM 40 MG IV SOLR
40.0000 mg | Freq: Two times a day (BID) | INTRAVENOUS | Status: DC
  Administered 2020-03-20 – 2020-03-22 (×5): 40 mg via INTRAVENOUS
  Filled 2020-03-19 (×6): qty 40

## 2020-03-19 MED ORDER — ACETAMINOPHEN 650 MG RE SUPP: 650.0000 mg | Freq: Four times a day (QID) | RECTAL | Status: DC | PRN

## 2020-03-19 MED ORDER — MEMANTINE HCL 10 MG PO TABS
10.0000 mg | ORAL_TABLET | Freq: Two times a day (BID) | ORAL | Status: DC
  Administered 2020-03-19 – 2020-03-21 (×5): 10 mg via ORAL
  Filled 2020-03-19 (×5): qty 1

## 2020-03-19 MED ORDER — SIMVASTATIN 20 MG PO TABS
40.0000 mg | ORAL_TABLET | Freq: Every day | ORAL | Status: DC
  Administered 2020-03-19 – 2020-03-20 (×2): 40 mg via ORAL
  Filled 2020-03-19 (×2): qty 2

## 2020-03-19 MED ORDER — ZOLPIDEM TARTRATE 5 MG PO TABS
5.0000 mg | ORAL_TABLET | Freq: Every day | ORAL | Status: DC
  Administered 2020-03-19 – 2020-03-21 (×3): 5 mg via ORAL
  Filled 2020-03-19 (×3): qty 1

## 2020-03-19 MED ORDER — ONDANSETRON HCL 4 MG/2ML IJ SOLN
4.0000 mg | Freq: Once | INTRAMUSCULAR | Status: AC
  Administered 2020-03-19: 4 mg via INTRAVENOUS
  Filled 2020-03-19: qty 2

## 2020-03-19 MED ORDER — INSULIN ASPART 100 UNIT/ML ~~LOC~~ SOLN
0.0000 [IU] | SUBCUTANEOUS | Status: DC
  Administered 2020-03-19 – 2020-03-20 (×2): 1 [IU] via SUBCUTANEOUS
  Administered 2020-03-21: 2 [IU] via SUBCUTANEOUS
  Administered 2020-03-22: 1 [IU] via SUBCUTANEOUS
  Administered 2020-03-22: 2 [IU] via SUBCUTANEOUS
  Administered 2020-03-23 – 2020-03-28 (×5): 1 [IU] via SUBCUTANEOUS

## 2020-03-19 MED ORDER — PANTOPRAZOLE SODIUM 40 MG IV SOLR
80.0000 mg | Freq: Once | INTRAVENOUS | Status: AC
  Administered 2020-03-19: 80 mg via INTRAVENOUS
  Filled 2020-03-19: qty 80

## 2020-03-19 MED ORDER — INSULIN ASPART 100 UNIT/ML ~~LOC~~ SOLN: 0.0000 [IU] | Freq: Every day | SUBCUTANEOUS | Status: DC

## 2020-03-19 MED ORDER — KETOROLAC TROMETHAMINE 15 MG/ML IJ SOLN: 15.0000 mg | Freq: Four times a day (QID) | INTRAMUSCULAR | Status: DC | PRN

## 2020-03-19 MED ORDER — ONDANSETRON HCL 4 MG/2ML IJ SOLN
4.0000 mg | Freq: Four times a day (QID) | INTRAMUSCULAR | Status: DC | PRN
  Administered 2020-03-20 – 2020-03-26 (×5): 4 mg via INTRAVENOUS
  Filled 2020-03-19 (×5): qty 2

## 2020-03-19 MED ORDER — LACTATED RINGERS IV SOLN: INTRAVENOUS | Status: AC

## 2020-03-19 MED ORDER — MORPHINE SULFATE (PF) 2 MG/ML IV SOLN
2.0000 mg | INTRAVENOUS | Status: DC | PRN
  Administered 2020-03-19 – 2020-03-27 (×15): 2 mg via INTRAVENOUS
  Filled 2020-03-19 (×15): qty 1

## 2020-03-19 MED ORDER — PRIMIDONE 50 MG PO TABS
25.0000 mg | ORAL_TABLET | Freq: Every day | ORAL | Status: DC
  Administered 2020-03-20 – 2020-03-21 (×2): 25 mg via ORAL
  Filled 2020-03-19 (×2): qty 1

## 2020-03-19 MED ORDER — AMLODIPINE BESYLATE 5 MG PO TABS
10.0000 mg | ORAL_TABLET | Freq: Every day | ORAL | Status: DC
  Administered 2020-03-19 – 2020-03-21 (×3): 10 mg via ORAL
  Filled 2020-03-19 (×3): qty 2

## 2020-03-19 MED ORDER — HEPARIN SODIUM (PORCINE) 5000 UNIT/ML IJ SOLN
5000.0000 [IU] | Freq: Three times a day (TID) | INTRAMUSCULAR | Status: DC
  Administered 2020-03-19 – 2020-03-28 (×26): 5000 [IU] via SUBCUTANEOUS
  Filled 2020-03-19 (×27): qty 1

## 2020-03-19 MED ORDER — MORPHINE SULFATE (PF) 2 MG/ML IV SOLN
3.0000 mg | Freq: Once | INTRAVENOUS | Status: AC
  Administered 2020-03-19: 3 mg via INTRAVENOUS
  Filled 2020-03-19: qty 2

## 2020-03-19 NOTE — Plan of Care (Signed)

## 2020-03-19 NOTE — ED Notes (Signed)
Patient transported to CT 

## 2020-03-19 NOTE — ED Provider Notes (Signed)
[facility] Provider Note   CSN: [id number] Arrival date & time: 03/19/20  1116     History Chief Complaint  Patient presents with  . Emesis    Laurie BARAY is a 85 y.o. female presents emergency department bilious emesis for 1 day. The patient reports that she began vomiting yesterday. She says originally the vomit was dark color but is since turned bright green. She does have a history of gastric ulcers and reports epigastric tenderness. She is also on Eliquis for an irregular heartbeat. She said similar things have happened to her in the past requiring hospitalization. She denies any known history of bowel obstruction. She denies any abdominal surgical history.  Last EGD on 07/18/2019: Normal proximal esophagus and mid esophagus. - Mild Schatzki ring. - LA Grade B esophagitis with no bleeding. - Z-line irregular, 38 cm from the incisors. - Non-bleeding gastric ulcers with no stigmata of bleeding. - Non-bleeding duodenal ulcer with no stigmata of bleeding. - Non-bleeding duodenal ulcers with pigmented material. - Non-bleeding duodenal ulcers with no stigmata of bleeding. - No specimens collected.  HPI     Past Medical History:  Diagnosis Date  . Anxiety   . Arthritis   . Cataracts, bilateral   . Depression   . Diabetes mellitus   . Diabetic peripheral neuropathy ([place])   . Fibromyalgia   . GERD (gastroesophageal reflux disease)   . Heart burn   . High cholesterol   . Hyperlipidemia   . Hypertension   . Leg swelling   . Shortness of breath    with exertion  . Weight loss   . Wheezing     Patient Active Problem List   Diagnosis Date Noted  . SBO (small bowel obstruction) ([place]) 03/19/2020  . History of Helicobacter pylori infection 08/19/2019  . Multiple gastric ulcers 08/19/2019  . Multiple duodenal ulcers 08/19/2019  . Abnormal weight loss 08/19/2019  . PUD (peptic ulcer disease)   . Acute blood loss anemia 07/17/2019  . Fibromyalgia  07/17/2019  . Bilateral pulmonary embolism ([place]) 02/16/2018  . Diabetic peripheral neuropathy ([place]) 02/16/2018  . Syncope 06/29/2016  . Renal insufficiency 06/29/2016  . Anemia 06/29/2016  . Edema   . Mild dementia ([place]) 03/01/2015  . Gait instability 03/01/2015  . Frequent falls 03/01/2015  . Memory loss 09/03/2014  . HTN (hypertension) 09/03/2014  . Hyperlipidemia 09/03/2014  . Diabetes type 2, controlled ([place]) 09/03/2014  . Confusion 07/13/2014  . UTI (lower urinary tract infection) 07/13/2014  . Acute encephalopathy 07/13/2014  . Fall at home 07/13/2014  . DM type 2 (diabetes mellitus, type 2) ([place]) 07/13/2014  . Esophageal dysphagia 06/09/2013  . GERD (gastroesophageal reflux disease) 06/09/2013  . Early satiety 06/09/2013    Past Surgical History:  Procedure Laterality Date  . ABDOMINAL HYSTERECTOMY    . CATARACT EXTRACTION W/PHACO  12/10/2011   Procedure: CATARACT EXTRACTION PHACO AND INTRAOCULAR LENS PLACEMENT (IOC);  Surgeon: Elta Guadeloupe T. Gershon Crane, MD;  Location: [facility];  Service: Ophthalmology;  Laterality: Left;  CDE=10.72  . CATARACT EXTRACTION W/PHACO  01/14/2012   Procedure: CATARACT EXTRACTION PHACO AND INTRAOCULAR LENS PLACEMENT (IOC);  Surgeon: Elta Guadeloupe T. Gershon Crane, MD;  Location: [facility];  Service: Ophthalmology;  Laterality: Right;  CDE:11.63  . ESOPHAGOGASTRODUODENOSCOPY N/A 07/18/2019   Procedure: ESOPHAGOGASTRODUODENOSCOPY (EGD);  Surgeon: Rogene Houston, MD; multiple gastric and duodenal ulcers, mild Schatzki ring in the distal esophagus, LA grade B esophagitis, irregular Z-line.  Repeat endoscopy in 3 months.  . ESOPHAGOGASTRODUODENOSCOPY (EGD)  WITH ESOPHAGEAL DILATION N/A 06/17/2013   DGL:OVFIEP esophagus-status post Venia Minks dilation. Hiatal hernia. Gastric erosions s/p bx  . ESOPHAGOGASTRODUODENOSCOPY (EGD) WITH PROPOFOL N/A 11/25/2019   Procedure: ESOPHAGOGASTRODUODENOSCOPY (EGD) WITH PROPOFOL;  Surgeon: Daneil Dolin, MD; Normal esophagus, erosive gastropathy with  some crpe appearance much better than previous EGD, superficial erosions/ulcerations small and scattered in the posterior duodenal bulb and second portion of the duodenum.  Gastric biop.-chronic gastritis w/o H. pylori.  Duodenal biop.-peptic duodenitis/ulceration  . foreign body removal     splinter removed from right side of face     OB History   No obstetric history on file.     Family History  Problem Relation Age of Onset  . Alzheimer's disease Mother   . Cancer Father   . Diabetes Daughter   . High blood pressure Daughter   . High blood pressure Daughter   . Cancer Sister   . Cancer Brother   . Pseudochol deficiency Neg Hx   . Malignant hyperthermia Neg Hx   . Hypotension Neg Hx   . Anesthesia problems Neg Hx   . Colon cancer Neg Hx   . Stomach cancer Neg Hx     Social History   Tobacco Use  . Smoking status: Former Smoker    Quit date: 08/30/1994    Years since quitting: 25.5  . Smokeless tobacco: Never Used  Vaping Use  . Vaping Use: Never used  Substance Use Topics  . Alcohol use: No    Alcohol/week: 0.0 standard drinks  . Drug use: No    Home Medications Prior to Admission medications   Medication Sig Start Date End Date Taking? Authorizing Provider  acetaminophen (TYLENOL) 325 MG tablet Take 2 tablets (650 mg total) by mouth every 6 (six) hours as needed for mild pain or headache (or Fever >/= 101). 02/17/18  Yes Barton Dubois, MD  albuterol (VENTOLIN HFA) 108 (90 Base) MCG/ACT inhaler Inhale 1-2 puffs into the lungs every 4 (four) hours as needed. 06/24/19  Yes [provider]  ALPRAZolam Duanne Moron) 0.5 MG tablet Take 0.5 mg by mouth 3 (three) times daily as needed. 03/17/20  Yes [provider]  amLODipine (NORVASC) 10 MG tablet Take 10 mg by mouth daily.  05/19/13  Yes [provider]  apixaban (ELIQUIS) 5 MG TABS tablet Eliquis 5 mg tablet  Take 1 tablet twice a day by oral route.   Yes [provider]  BELSOMRA 10 MG  TABS Take 1 tablet by mouth at bedtime as needed. 03/02/20  Yes [provider]  busPIRone (BUSPAR) 10 MG tablet Take 10 mg by mouth 2 (two) times daily. 03/17/20  Yes [provider]  Cholecalciferol (VITAMIN D-3) 1000 UNITS CAPS Take 1 capsule by mouth 2 (two) times daily.    Yes [provider]  doxycycline (VIBRAMYCIN) 50 MG capsule Take 50 mg by mouth at bedtime. 02/28/20  Yes [provider]  furosemide (LASIX) 20 MG tablet Take 20 mg by mouth daily as needed. 03/17/20  Yes [provider]  gabapentin (NEURONTIN) 600 MG tablet Take 600 mg by mouth 3 (three) times daily. 07/05/19  Yes [provider]  losartan (COZAAR) 25 MG tablet Take 25 mg by mouth daily.   Yes [provider]  memantine (NAMENDA) 10 MG tablet Take 10 mg by mouth 2 (two) times daily. 03/14/20  Yes [provider]  metFORMIN (GLUCOPHAGE) 500 MG tablet Take 500 mg by mouth 2 (two) times daily with a meal.  Yes [provider]  metroNIDAZOLE (METROCREAM) 0.75 % cream Apply 1 application topically 2 (two) times daily as needed. 06/22/19  Yes [provider]  mirtazapine (REMERON) 30 MG tablet Take 30 mg by mouth at bedtime.  01/14/18  Yes [provider]  Multiple Vitamin (MULTIVITAMIN WITH MINERALS) TABS tablet Take 1 tablet by mouth daily.   Yes [provider]  omeprazole (PRILOSEC) 40 MG capsule Take 1 capsule (40 mg total) by mouth 2 (two) times daily. 07/20/19  Yes Barton Dubois, MD  Oxycodone HCl 10 MG TABS Take 10 mg by mouth every 4 (four) hours as needed. 07/28/19  Yes [provider]  potassium chloride SA (K-DUR,KLOR-CON) 20 MEQ tablet Take 20 mEq by mouth every morning.    Yes [provider]  primidone (MYSOLINE) 50 MG tablet Take 25 mg by mouth at bedtime. 06/21/19  Yes [provider]  simvastatin (ZOCOR) 40 MG tablet Take 40 mg by mouth daily. 06/06/16  Yes [provider]    traMADol (ULTRAM) 50 MG tablet Take 50 mg by mouth every 4 (four) hours as needed. 06/16/19  Yes [provider]  zolpidem (AMBIEN) 5 MG tablet Take 5 mg by mouth at bedtime. 06/16/19  Yes [provider]    Allergies    Penicillins  Review of Systems   Review of Systems  Constitutional: Negative for chills and fever.  HENT: Negative for ear pain and sore throat.   Eyes: Negative for photophobia and visual disturbance.  Respiratory: Negative for cough and shortness of breath.   Cardiovascular: Negative for chest pain and palpitations.  Gastrointestinal: Positive for abdominal pain, nausea and vomiting.  Genitourinary: Negative for dysuria and hematuria.  Musculoskeletal: Negative for arthralgias and back pain.  Skin: Negative for color change and rash.  Neurological: Negative for syncope and light-headedness.  Psychiatric/Behavioral: Negative for agitation and confusion.  All other systems reviewed and are negative.   Physical Exam Updated Vital Signs BP (!) 149/80 (BP Location: Right Arm)   Pulse (!) 58   Temp 98.5 F (36.9 C) (Oral)   Resp 18   Ht 5\' 2"  (1.575 m)   Wt 83.5 kg   SpO2 95%   BMI 33.67 kg/m   Physical Exam Vitals and nursing note reviewed.  Constitutional:      General: She is not in acute distress.    Appearance: She is well-developed.  HENT:     Head: Normocephalic and atraumatic.  Eyes:     Conjunctiva/sclera: Conjunctivae normal.  Cardiovascular:     Rate and Rhythm: Normal rate and regular rhythm.     Pulses: Normal pulses.  Pulmonary:     Effort: Pulmonary effort is normal. No respiratory distress.     Breath sounds: Normal breath sounds.  Abdominal:     General: There is distension.     Palpations: Abdomen is soft.     Tenderness: There is abdominal tenderness.  Musculoskeletal:     Cervical back: Neck supple.  Skin:    General: Skin is warm and dry.  Neurological:     General: No focal deficit present.     Mental  Status: She is alert and oriented to person, place, and time.     ED Results / Procedures / Treatments   Labs (all labs ordered are listed, but only abnormal results are displayed) Labs Reviewed  COMPREHENSIVE METABOLIC PANEL - Abnormal; Notable for the following components:      Result Value   Chloride 94 (*)  Glucose, Bld 137 (*)    BUN 36 (*)    Creatinine, Ser 1.65 (*)    GFR calc non Af Amer 28 (*)    GFR calc Af Amer 33 (*)    All other components within normal limits  CBC - Abnormal; Notable for the following components:   WBC 11.8 (*)    MCH 25.2 (*)    RDW 18.6 (*)    All other components within normal limits  PROTIME-INR - Abnormal; Notable for the following components:   Prothrombin Time 15.4 (*)    INR 1.3 (*)    All other components within normal limits  SARS CORONAVIRUS 2 BY RT PCR (HOSPITAL ORDER, [facility])  LIPASE, BLOOD  HEMOGLOBIN A1C  CBC  CREATININE, SERUM  MAGNESIUM  COMPREHENSIVE METABOLIC PANEL  CBC  SODIUM, URINE, RANDOM  CREATININE, URINE, RANDOM    EKG EKG Interpretation  Date/Time:  Sunday March 19 2020 11:25:39 EDT Ventricular Rate:  111 PR Interval:  166 QRS Duration: 76 QT Interval:  316 QTC Calculation: 429 R Axis:   73 Text Interpretation: Sinus tachycardia with Premature atrial complexes Nonspecific ST abnormality Abnormal ECG No STEMI Confirmed by Octaviano Glow [phone]) on 03/19/2020 11:50:37 AM   Radiology CT ABDOMEN PELVIS WO CONTRAST  Result Date: 03/19/2020 CLINICAL DATA:  Acute lower abdominal pain. EXAM: CT ABDOMEN AND PELVIS WITHOUT CONTRAST TECHNIQUE: Multidetector CT imaging of the abdomen and pelvis was performed following the standard protocol without IV contrast. COMPARISON:  None. FINDINGS: Lower chest: No acute abnormality. Hepatobiliary: No focal liver abnormality is seen. No gallstones, gallbladder wall thickening, or biliary dilatation. Pancreas: Unremarkable. No pancreatic ductal  dilatation or surrounding inflammatory changes. Spleen: Normal in size without focal abnormality. Adrenals/Urinary Tract: Adrenal glands are unremarkable. Kidneys are normal, without renal calculi, focal lesion, or hydronephrosis. Bladder is unremarkable. Stomach/Bowel: Mild gastric distention is noted. Moderate to severe small bowel dilatation is noted. Definite transition zone is not identified, although most likely is in the pelvis as there is nondilated ileum in this area. The appendix is unremarkable. No colonic dilatation is noted. Mild amount of stool is seen throughout the colon. Vascular/Lymphatic: Aortic atherosclerosis. No enlarged abdominal or pelvic lymph nodes. Reproductive: Status post hysterectomy. No adnexal masses. Other: No abdominal wall hernia or abnormality. No abdominopelvic ascites. Musculoskeletal: No acute or significant osseous findings. IMPRESSION: 1. Moderate to severe small bowel dilatation is noted concerning for distal small bowel obstruction. Definite transition zone is not identified, although most likely is in the pelvis as there is nondilated ileum in this area. Aortic Atherosclerosis (ICD10-I70.0). Electronically Signed   By: Marijo Conception M.D.   On: 03/19/2020 14:18   DG Chest Portable 1 View  Result Date: 03/19/2020 CLINICAL DATA:  Epigastric and chest pain. EXAM: PORTABLE CHEST 1 VIEW COMPARISON:  February 16, 2018 FINDINGS: Cardiomediastinal silhouette is normal. Mediastinal contours appear intact. Chronic elevation of the right hemidiaphragm. There is no evidence of focal airspace consolidation, pleural effusion or pneumothorax. Osseous structures are without acute abnormality. Soft tissues are grossly normal. IMPRESSION: No active disease. Chronic elevation of right hemidiaphragm. Electronically Signed   By: Fidela Salisbury M.D.   On: 03/19/2020 13:10    Procedures Procedures (including critical care time)  Medications Ordered in ED Medications  morphine 2  MG/ML injection 2 mg (has no administration in time range)  traMADol (ULTRAM) tablet 50 mg (has no administration in time range)  amLODipine (NORVASC) tablet 10 mg (has no administration  in time range)  simvastatin (ZOCOR) tablet 40 mg (has no administration in time range)  ALPRAZolam (XANAX) tablet 0.5 mg (has no administration in time range)  busPIRone (BUSPAR) tablet 10 mg (has no administration in time range)  memantine (NAMENDA) tablet 10 mg (has no administration in time range)  mirtazapine (REMERON) tablet 30 mg (has no administration in time range)  zolpidem (AMBIEN) tablet 5 mg (has no administration in time range)  gabapentin (NEURONTIN) tablet 600 mg (has no administration in time range)  primidone (MYSOLINE) tablet 25 mg (has no administration in time range)  heparin injection 5,000 Units (has no administration in time range)  lactated ringers infusion (has no administration in time range)  acetaminophen (TYLENOL) tablet 650 mg (has no administration in time range)    Or  acetaminophen (TYLENOL) suppository 650 mg (has no administration in time range)  ondansetron (ZOFRAN) tablet 4 mg (has no administration in time range)    Or  ondansetron (ZOFRAN) injection 4 mg (has no administration in time range)  insulin aspart (novoLOG) injection 0-9 Units (has no administration in time range)  insulin aspart (novoLOG) injection 0-5 Units (has no administration in time range)  pantoprazole (PROTONIX) injection 40 mg (has no administration in time range)  sodium chloride 0.9 % bolus 1,000 mL (0 mLs Intravenous Stopped 03/19/20 1400)  pantoprazole (PROTONIX) injection 80 mg (80 mg Intravenous Given 03/19/20 1228)  morphine 2 MG/ML injection 3 mg (3 mg Intravenous Given 03/19/20 1343)  ondansetron (ZOFRAN) injection 4 mg (4 mg Intravenous Given 03/19/20 1343)    ED Course  I have reviewed the triage vital signs and the nursing notes.  Pertinent labs & imaging results that were available  during my care of the patient were reviewed by me and considered in my medical decision making (see chart for details).  85 yo female presenting to ED with bilious emesis for 2 days.  Her vomit appears green on her face mask on arrival.  She has not had further vomiting since arrival.  Not passing gas today.  DDx includes bowel obstruction - cannot exclude possibility of gastric ulcers causing her epigastric pain given her hx and EGD findings as noted above.  She is on eliquis for A Fib.  I personally reviewed her labs notable for Cr 1.76, K 4.0, lipase 33, WBC 11.8, lft's wnl Hgb was normal at 12.0 - doubt large GI hemorrhage  I personally reviewed her xray showing no acute findings I reviewed her ECG showing sinus tachycardia with PAC's, nonischemic  CT showed distal small bowel obstruction  I consulted with general surgeon Dr Arnoldo Morale who advised medical admission, NPO, IV fluids, he will evaluate tomorrow.    Clinical Course as of Mar 19 1817  Sun Mar 19, 2020  1547 Admitted to hospitalist.  Spoke to Dr Arnoldo Morale about bowel obstruction, he will see tomorrow if she remains stable.  She's had no further vomiting.   With her multiple gastric ulcers seen on prior EGD, I've decided not to place an NG tube at this time.   [MT]    Clinical Course User Index [MT] Mckinley Adelstein, Carola Rhine, MD    Final Clinical Impression(s) / ED Diagnoses Final diagnoses:  Other specified intestinal obstruction, unspecified whether partial or complete [facility])    Rx / DC Orders ED Discharge Orders    None       Wyvonnia Dusky, MD 03/19/20 1818

## 2020-03-19 NOTE — ED Triage Notes (Signed)
Patient comes to the ED by [facility] due to nausea vomiting for one day.  Patient describes emesis as dark colored. This patient has a history of gastric ulcers.  EMS reports CBG 185, patient is diabetic. Patient given 4mg  IV zofran en route with fluids.  Vitals WDL,

## 2020-03-19 NOTE — H&P (Addendum)
History and Physical    Laurie Mcfarland NWG:[id number] DOB: 1935-05-26 DOA: 03/19/2020  PCP: [name], MD   Patient coming from: Home  Chief Complaint: Nausea/vomiting with abdominal pain  HPI: Laurie Mcfarland is a 85 y.o. female with medical history significant for mild dementia, fibromyalgia, anxiety/depression, chronic pain, type 2 diabetes, dyslipidemia, hypertension, CKD stage IIIa, history of peptic and duodenal ulcers, and prior PE-currently on Eliquis, who to the ED with nausea and vomiting that began about 2 days ago.  She initially states that her emesis was dark in color, but has now turned brighter green.  She is noted to have abdominal distention and tenderness throughout and she has not been able to eat anything on account of her persistent nausea and vomiting.  She denies any bowel movements in the last 2 days.  She denies any fevers or chills.  She states that she continues to take Eliquis for blood clot previously, but has never been told to discontinue even though she has completed treatment over the course of 12 months.  She has had prior hysterectomy many years ago.   ED Course: Stable vital signs noted and patient is afebrile.  She had received some Zofran and morphine in the ED with some relief of her symptoms.  1 view chest x-ray with no acute findings.  CT of the abdomen pelvis with moderate to severe distal SBO noted.  EDP had discussed with Dr. Arnoldo Morale with general surgery who will assess in a.m.  Patient has not had any nausea or vomiting since arrival to the ED.  Creatinine is 1.65 with baseline 0.9-1.  EKG with mild sinus tachycardia noted.  Review of Systems: All others reviewed and otherwise negative except as noted above.  Past Medical History:  Diagnosis Date  . Anxiety   . Arthritis   . Cataracts, bilateral   . Depression   . Diabetes mellitus   . Diabetic peripheral neuropathy ([place])   . Fibromyalgia   . GERD (gastroesophageal reflux disease)   . Heart  burn   . High cholesterol   . Hyperlipidemia   . Hypertension   . Leg swelling   . Shortness of breath    with exertion  . Weight loss   . Wheezing     Past Surgical History:  Procedure Laterality Date  . ABDOMINAL HYSTERECTOMY    . CATARACT EXTRACTION W/PHACO  12/10/2011   Procedure: CATARACT EXTRACTION PHACO AND INTRAOCULAR LENS PLACEMENT (IOC);  Surgeon: Elta Guadeloupe T. Gershon Crane, MD;  Location: [facility];  Service: Ophthalmology;  Laterality: Left;  CDE=10.72  . CATARACT EXTRACTION W/PHACO  01/14/2012   Procedure: CATARACT EXTRACTION PHACO AND INTRAOCULAR LENS PLACEMENT (IOC);  Surgeon: Elta Guadeloupe T. Gershon Crane, MD;  Location: [facility];  Service: Ophthalmology;  Laterality: Right;  CDE:11.63  . ESOPHAGOGASTRODUODENOSCOPY N/A 07/18/2019   Procedure: ESOPHAGOGASTRODUODENOSCOPY (EGD);  Surgeon: Rogene Houston, MD; multiple gastric and duodenal ulcers, mild Schatzki ring in the distal esophagus, LA grade B esophagitis, irregular Z-line.  Repeat endoscopy in 3 months.  . ESOPHAGOGASTRODUODENOSCOPY (EGD) WITH ESOPHAGEAL DILATION N/A 06/17/2013   VHQ:IONGEX esophagus-status post Venia Minks dilation. Hiatal hernia. Gastric erosions s/p bx  . ESOPHAGOGASTRODUODENOSCOPY (EGD) WITH PROPOFOL N/A 11/25/2019   Procedure: ESOPHAGOGASTRODUODENOSCOPY (EGD) WITH PROPOFOL;  Surgeon: Daneil Dolin, MD; Normal esophagus, erosive gastropathy with some crpe appearance much better than previous EGD, superficial erosions/ulcerations small and scattered in the posterior duodenal bulb and second portion of the duodenum.  Gastric biop.-chronic gastritis w/o H. pylori.  Duodenal biop.-peptic duodenitis/ulceration  .  foreign body removal     splinter removed from right side of face     reports that she quit smoking about 25 years ago. She has never used smokeless tobacco. She reports that she does not drink alcohol and does not use drugs.  Allergies  Allergen Reactions  . Penicillins Rash    Has patient had a PCN reaction causing  immediate rash, facial/tongue/throat swelling, SOB or lightheadedness with hypotension: No Has patient had a PCN reaction causing severe rash involving mucus membranes or skin necrosis: No Has patient had a PCN reaction that required hospitalization: No Has patient had a PCN reaction occurring within the last 10 years: No If all of the above answers are "NO", then may proceed with Cephalosporin use.     Family History  Problem Relation Age of Onset  . Alzheimer's disease Mother   . Cancer Father   . Diabetes Daughter   . High blood pressure Daughter   . High blood pressure Daughter   . Cancer Sister   . Cancer Brother   . Pseudochol deficiency Neg Hx   . Malignant hyperthermia Neg Hx   . Hypotension Neg Hx   . Anesthesia problems Neg Hx   . Colon cancer Neg Hx   . Stomach cancer Neg Hx     Prior to Admission medications   Medication Sig Start Date End Date Taking? Authorizing Provider  acetaminophen (TYLENOL) 325 MG tablet Take 2 tablets (650 mg total) by mouth every 6 (six) hours as needed for mild pain or headache (or Fever >/= 101). 02/17/18  Yes Barton Dubois, MD  albuterol (VENTOLIN HFA) 108 (90 Base) MCG/ACT inhaler Inhale 1-2 puffs into the lungs every 4 (four) hours as needed. 06/24/19  Yes [provider]  ALPRAZolam Duanne Moron) 0.5 MG tablet Take 0.5 mg by mouth 3 (three) times daily as needed. 03/17/20  Yes [provider]  amLODipine (NORVASC) 10 MG tablet Take 10 mg by mouth daily.  05/19/13  Yes [provider]  apixaban (ELIQUIS) 5 MG TABS tablet Eliquis 5 mg tablet  Take 1 tablet twice a day by oral route.   Yes [provider]  BELSOMRA 10 MG TABS Take 1 tablet by mouth at bedtime as needed. 03/02/20  Yes [provider]  busPIRone (BUSPAR) 10 MG tablet Take 10 mg by mouth 2 (two) times daily. 03/17/20  Yes [provider]  Cholecalciferol (VITAMIN D-3) 1000 UNITS CAPS Take 1 capsule by mouth 2 (two) times daily.    Yes  [provider]  doxycycline (VIBRAMYCIN) 50 MG capsule Take 50 mg by mouth at bedtime. 02/28/20  Yes [provider]  furosemide (LASIX) 20 MG tablet Take 20 mg by mouth daily as needed. 03/17/20  Yes [provider]  gabapentin (NEURONTIN) 600 MG tablet Take 600 mg by mouth 3 (three) times daily. 07/05/19  Yes [provider]  losartan (COZAAR) 25 MG tablet Take 25 mg by mouth daily.   Yes [provider]  memantine (NAMENDA) 10 MG tablet Take 10 mg by mouth 2 (two) times daily. 03/14/20  Yes [provider]  metFORMIN (GLUCOPHAGE) 500 MG tablet Take 500 mg by mouth 2 (two) times daily with a meal.    Yes [provider]  metroNIDAZOLE (METROCREAM) 0.75 % cream Apply 1 application topically 2 (two) times daily as needed. 06/22/19  Yes [provider]  mirtazapine (REMERON) 30 MG tablet Take 30 mg by mouth at bedtime.  01/14/18  Yes [provider]  Multiple Vitamin (MULTIVITAMIN WITH MINERALS) TABS tablet Take 1 tablet by mouth daily.   Yes [provider]  omeprazole (PRILOSEC) 40 MG capsule Take 1 capsule (40 mg total) by mouth 2 (two) times daily. 07/20/19  Yes Barton Dubois, MD  Oxycodone HCl 10 MG TABS Take 10 mg by mouth every 4 (four) hours as needed. 07/28/19  Yes [provider]  potassium chloride SA (K-DUR,KLOR-CON) 20 MEQ tablet Take 20 mEq by mouth every morning.    Yes [provider]  primidone (MYSOLINE) 50 MG tablet Take 25 mg by mouth at bedtime. 06/21/19  Yes [provider]  simvastatin (ZOCOR) 40 MG tablet Take 40 mg by mouth daily. 06/06/16  Yes [provider]  traMADol (ULTRAM) 50 MG tablet Take 50 mg by mouth every 4 (four) hours as needed. 06/16/19  Yes [provider]  zolpidem (AMBIEN) 5 MG tablet Take 5 mg by mouth at bedtime. 06/16/19  Yes [provider]    Physical Exam: Vitals:   03/19/20 1400 03/19/20 1430 03/19/20 1500  03/19/20 1703  BP: (!) 133/91 (!) 155/84 138/76 (!) 149/80  Pulse: 99   (!) 58  Resp: 15 16 16 18   Temp:    98.5 F (36.9 C)  TempSrc:    Oral  SpO2: 95%  98% 95%  Weight:    83.5 kg  Height:    5\' 2"  (1.575 m)    Constitutional: NAD, calm, comfortable Vitals:   03/19/20 1400 03/19/20 1430 03/19/20 1500 03/19/20 1703  BP: (!) 133/91 (!) 155/84 138/76 (!) 149/80  Pulse: 99   (!) 58  Resp: 15 16 16 18   Temp:    98.5 F (36.9 C)  TempSrc:    Oral  SpO2: 95%  98% 95%  Weight:    83.5 kg  Height:    5\' 2"  (1.575 m)   Eyes: lids and conjunctivae normal ENMT: Mucous membranes are moist.  Neck: normal, supple Respiratory: clear to auscultation bilaterally. Normal respiratory effort. No accessory muscle use.  Cardiovascular: Regular rate and rhythm, no murmurs. No extremity edema. Abdomen: Minimally distended, hypoactive bowel sounds, tenderness to palpation diffusely Musculoskeletal:  No joint deformity upper and lower extremities.   Skin: no rashes, lesions, ulcers.  Psychiatric: Normal judgment and insight. Alert and oriented x 3. Normal mood.   Labs on Admission: I have personally reviewed following labs and imaging studies  CBC: Recent Labs  Lab 03/19/20 1212  WBC 11.8*  HGB 12.0  HCT 38.8  MCV 81.3  PLT 889   Basic Metabolic Panel: Recent Labs  Lab 03/19/20 1212  NA 136  K 4.0  CL 94*  CO2 28  GLUCOSE 137*  BUN 36*  CREATININE 1.65*  CALCIUM 9.4   GFR: Estimated Creatinine Clearance: 25.9 mL/min (A) (by C-G formula based on SCr of 1.65 mg/dL (H)). Liver Function Tests: Recent Labs  Lab 03/19/20 1212  AST 25  ALT 18  ALKPHOS 78  BILITOT 0.5  PROT 7.9  ALBUMIN 4.0   Recent Labs  Lab 03/19/20 1212  LIPASE 33   No results for input(s): AMMONIA in the last 168 hours. Coagulation Profile: Recent Labs  Lab 03/19/20 1212  INR 1.3*   Cardiac Enzymes: No results for input(s): CKTOTAL, CKMB, CKMBINDEX, TROPONINI in the last 168 hours. BNP (last  3 results) No results for input(s): PROBNP in the last 8760 hours. HbA1C: No results for input(s): HGBA1C in the last 72 hours. CBG: No results for  input(s): GLUCAP in the last 168 hours. Lipid Profile: No results for input(s): CHOL, HDL, LDLCALC, TRIG, CHOLHDL, LDLDIRECT in the last 72 hours. Thyroid Function Tests: No results for input(s): TSH, T4TOTAL, FREET4, T3FREE, THYROIDAB in the last 72 hours. Anemia Panel: No results for input(s): VITAMINB12, FOLATE, FERRITIN, TIBC, IRON, RETICCTPCT in the last 72 hours. Urine analysis:    Component Value Date/Time   COLORURINE YELLOW 02/16/2018 1404   APPEARANCEUR CLEAR 02/16/2018 1404   LABSPEC 1.008 02/16/2018 1404   PHURINE 6.0 02/16/2018 1404   GLUCOSEU NEGATIVE 02/16/2018 1404   HGBUR NEGATIVE 02/16/2018 1404   BILIRUBINUR NEGATIVE 02/16/2018 1404   KETONESUR NEGATIVE 02/16/2018 1404   PROTEINUR NEGATIVE 02/16/2018 1404   UROBILINOGEN 0.2 07/13/2014 1021   NITRITE NEGATIVE 02/16/2018 1404   LEUKOCYTESUR NEGATIVE 02/16/2018 1404    Radiological Exams on Admission: CT ABDOMEN PELVIS WO CONTRAST  Result Date: 03/19/2020 CLINICAL DATA:  Acute lower abdominal pain. EXAM: CT ABDOMEN AND PELVIS WITHOUT CONTRAST TECHNIQUE: Multidetector CT imaging of the abdomen and pelvis was performed following the standard protocol without IV contrast. COMPARISON:  None. FINDINGS: Lower chest: No acute abnormality. Hepatobiliary: No focal liver abnormality is seen. No gallstones, gallbladder wall thickening, or biliary dilatation. Pancreas: Unremarkable. No pancreatic ductal dilatation or surrounding inflammatory changes. Spleen: Normal in size without focal abnormality. Adrenals/Urinary Tract: Adrenal glands are unremarkable. Kidneys are normal, without renal calculi, focal lesion, or hydronephrosis. Bladder is unremarkable. Stomach/Bowel: Mild gastric distention is noted. Moderate to severe small bowel dilatation is noted. Definite transition zone is not  identified, although most likely is in the pelvis as there is nondilated ileum in this area. The appendix is unremarkable. No colonic dilatation is noted. Mild amount of stool is seen throughout the colon. Vascular/Lymphatic: Aortic atherosclerosis. No enlarged abdominal or pelvic lymph nodes. Reproductive: Status post hysterectomy. No adnexal masses. Other: No abdominal wall hernia or abnormality. No abdominopelvic ascites. Musculoskeletal: No acute or significant osseous findings. IMPRESSION: 1. Moderate to severe small bowel dilatation is noted concerning for distal small bowel obstruction. Definite transition zone is not identified, although most likely is in the pelvis as there is nondilated ileum in this area. Aortic Atherosclerosis (ICD10-I70.0). Electronically Signed   By: Marijo Conception M.D.   On: 03/19/2020 14:18   DG Chest Portable 1 View  Result Date: 03/19/2020 CLINICAL DATA:  Epigastric and chest pain. EXAM: PORTABLE CHEST 1 VIEW COMPARISON:  February 16, 2018 FINDINGS: Cardiomediastinal silhouette is normal. Mediastinal contours appear intact. Chronic elevation of the right hemidiaphragm. There is no evidence of focal airspace consolidation, pleural effusion or pneumothorax. Osseous structures are without acute abnormality. Soft tissues are grossly normal. IMPRESSION: No active disease. Chronic elevation of right hemidiaphragm. Electronically Signed   By: Fidela Salisbury M.D.   On: 03/19/2020 13:10    EKG: Independently reviewed. ST with PACs 111bpm.  Assessment/Plan Active Problems:   SBO (small bowel obstruction) (HCC)    Moderate to severe distal SBO -Keep n.p.o. except medications with sips -IV fluid for hydration -Morphine for pain control -Zofran as needed for nausea or vomiting -Appreciate general surgical consultation in a.m. for further recommendations/evaluation  AKI on CKD stage IIIa -Baseline creatinine appears to be 0.9-1 -Current creatinine 1.65 -Plan to keep on  IV fluid for hydration as I suspect this is prerenal from dehydration -Repeat labs in a.m. -Urine electrolytes -Further work-up pending trend  History of diabetes type 2 with neuropathy -SSI with every 4 hour checks while inpatient and n.p.o. -No significant hyperglycemia  currently noted -Continue gabapentin for neuropathy  History of hypertension -Continue on amlodipine with holding of Lasix and losartan given AKI -Currently stable continue to monitor  History of chronic pain -Continue home pain medications as well as fibromyalgia medications -IV morphine for breakthrough pain  History of GERD/PUD/duodenal ulcers -Recent EGD 11/25/2019 with erosive gastropathy with superficial erosions/ulcerations in posterior duodenal bulb -Continue on IV PPI BID while NPO -Prior history of H. pylori and chronic NSAID use  History of dyslipidemia -Continue statin  History of fibromyalgia/depression/anxiety -Continue home medications  History of mild dementia -Continue Namenda  Insomnia -Continue home medications  History of prior PE -She continues to take Eliquis at home, but has completed a 43-month course of treatment -According to prior discharge summary on 07/2019 she should have discontinued at that time -We will plan to hold Eliquis while here and even on discharge -Keep on heparin injections as noted below  DVT prophylaxis: Heparin  Code Status: Full Family Communication: Daughter Olin Hauser at bedside Disposition Plan:Admit for eval/tx of SBO Consults called: Dr. Arnoldo Morale aware Admission status: Inpatient, tele  Status is: Inpatient  Remains inpatient appropriate because:IV treatments appropriate due to intensity of illness or inability to take PO and Inpatient level of care appropriate due to severity of illness   Dispo: The patient is from: Home              Anticipated d/c is to: Home              Anticipated d/c date is: 2 days              Patient currently is not  medically stable to d/c.  Laurie Mcfarland D Manuella Ghazi DO [facility]  If 7PM-7AM, please contact night-coverage [email or web address]  03/19/2020, 5:51 PM

## 2020-03-19 NOTE — ED Notes (Signed)
Family updated.

## 2020-03-20 LAB — GLUCOSE, CAPILLARY
Glucose-Capillary: 113 mg/dL — ABNORMAL HIGH (ref 70–99)
Glucose-Capillary: 125 mg/dL — ABNORMAL HIGH (ref 70–99)
Glucose-Capillary: 131 mg/dL — ABNORMAL HIGH (ref 70–99)
Glucose-Capillary: 105 mg/dL — ABNORMAL HIGH (ref 70–99)
Glucose-Capillary: 99 mg/dL (ref 70–99)

## 2020-03-20 LAB — CBC
HCT: 36.2 % (ref 36.0–46.0)
Hemoglobin: 11 g/dL — ABNORMAL LOW (ref 12.0–15.0)
MCH: 24.9 pg — ABNORMAL LOW (ref 26.0–34.0)
MCHC: 30.4 g/dL (ref 30.0–36.0)
MCV: 81.9 fL (ref 80.0–100.0)
Platelets: 251 10*3/uL (ref 150–400)
RBC: 4.42 MIL/uL (ref 3.87–5.11)
RDW: 18.6 % — ABNORMAL HIGH (ref 11.5–15.5)
WBC: 9.3 10*3/uL (ref 4.0–10.5)
nRBC: 0 % (ref 0.0–0.2)

## 2020-03-20 LAB — COMPREHENSIVE METABOLIC PANEL
ALT: 14 U/L (ref 0–44)
AST: 19 U/L (ref 15–41)
Albumin: 3.1 g/dL — ABNORMAL LOW (ref 3.5–5.0)
Alkaline Phosphatase: 65 U/L (ref 38–126)
Anion gap: 10 (ref 5–15)
BUN: 30 mg/dL — ABNORMAL HIGH (ref 8–23)
CO2: 24 mmol/L (ref 22–32)
Calcium: 8.3 mg/dL — ABNORMAL LOW (ref 8.9–10.3)
Chloride: 101 mmol/L (ref 98–111)
Creatinine, Ser: 1.17 mg/dL — ABNORMAL HIGH (ref 0.44–1.00)
GFR calc Af Amer: 50 mL/min — ABNORMAL LOW (ref >60)
GFR calc non Af Amer: 43 mL/min — ABNORMAL LOW (ref >60)
Glucose, Bld: 117 mg/dL — ABNORMAL HIGH (ref 70–99)
Potassium: 3.7 mmol/L (ref 3.5–5.1)
Sodium: 135 mmol/L (ref 135–145)
Total Bilirubin: 0.8 mg/dL (ref 0.3–1.2)
Total Protein: 6.4 g/dL — ABNORMAL LOW (ref 6.5–8.1)

## 2020-03-20 LAB — MAGNESIUM: Magnesium: 1.9 mg/dL (ref 1.7–2.4)

## 2020-03-20 LAB — HEMOGLOBIN A1C
Hgb A1c MFr Bld: 6.2 % — ABNORMAL HIGH (ref 4.8–5.6)
Mean Plasma Glucose: 131.24 mg/dL

## 2020-03-20 MED ORDER — LACTATED RINGERS IV SOLN: INTRAVENOUS | Status: AC

## 2020-03-20 MED ORDER — BISACODYL 10 MG RE SUPP
10.0000 mg | Freq: Every day | RECTAL | Status: DC
  Administered 2020-03-20 – 2020-03-21 (×2): 10 mg via RECTAL
  Filled 2020-03-20 (×3): qty 1

## 2020-03-20 MED ORDER — ATORVASTATIN CALCIUM 20 MG PO TABS
20.0000 mg | ORAL_TABLET | Freq: Every day | ORAL | Status: DC
  Administered 2020-03-21: 20 mg via ORAL
  Filled 2020-03-20: qty 1

## 2020-03-20 NOTE — Plan of Care (Signed)
  Problem: Clinical Measurements: Goal: Respiratory complications will improve Outcome: Progressing Goal: Cardiovascular complication will be avoided Outcome: Progressing   Problem: Pain Managment: Goal: General experience of comfort will improve Outcome: Progressing   Problem: Safety: Goal: Ability to remain free from injury will improve Outcome: Progressing   Problem: Skin Integrity: Goal: Risk for impaired skin integrity will decrease Outcome: Progressing   

## 2020-03-20 NOTE — Progress Notes (Signed)
PROGRESS NOTE    Laurie Mcfarland  VWU:[id number] DOB: [birth year] DOA: 03/19/2020 PCP: [name], MD (Confirm with patient/family/NH records and if not entered, this HAS to be entered at [facility] point of entry. "No PCP" if truly none.)   Chief Complaint  Patient presents with  . Emesis    Brief Narrative: As per H&P written by [name] on 03/19/2020 85 y.o. female with medical history significant for mild dementia, fibromyalgia, anxiety/depression, chronic pain, type 2 diabetes, dyslipidemia, hypertension, CKD stage IIIa, history of peptic and duodenal ulcers, and prior PE-currently on Eliquis, who to the ED with nausea and vomiting that began about 2 days ago.  She initially states that her emesis was dark in color, but has now turned brighter green.  She is noted to have abdominal distention and tenderness throughout and she has not been able to eat anything on account of her persistent nausea and vomiting.  She denies any bowel movements in the last 2 days.  She denies any fevers or chills.  She states that she continues to take Eliquis for blood clot previously, but has never been told to discontinue even though she has completed treatment over the course of 12 months.  She has had prior hysterectomy many years ago.   ED Course: Stable vital signs noted and patient is afebrile.  She had received some Zofran and morphine in the ED with some relief of her symptoms.  1 view chest x-ray with no acute findings.  CT of the abdomen pelvis with moderate to severe distal SBO noted.  EDP had discussed with Dr. Arnoldo Morale with general surgery who will assess in a.m.  Patient has not had any nausea or vomiting since arrival to the ED.  Creatinine is 1.65 with baseline 0.9-1.  EKG with mild sinus tachycardia noted.  Assessment & Plan: 1-distal SBO -Continue to have abdominal pain and mild intermittent nausea. -Continue bowel rest, IV fluid resuscitation and as needed analgesics/antiemetics. -General surgery  is on board and will follow further recommendation -Patient currently without active vomiting, NG tube orders on hold. -Still not passing gas or having BMs.  2-acute kidney injury on chronic kidney disease a stage IIIa -Baseline creatinine 0.9-1 -Creatinine peaked at 1.65 on presentation -Continue to minimize/avoid nephrotoxic agents-continue IV fluids -No signs or concerns for UTI. -Follow-up renal function trend.  3-type 2 diabetes with neuropathy -Continue sliding scale insulin -Continue the use of gabapentin. -Close monitoring of patient's CBGs, especially while NPO.  4-history of hypertension -Appears to be stable and well-controlled -Continue current antihypertensive regimen, except for losartan and Lasix given acute kidney injury and high chances for dehydration with n.p.o. status.  5-history of chronic pain -Continue pain medications as per home regimen.  6-gastroesophageal reflux disease/peptic ulcer disease -Continue PPI.  7-history of depression/anxiety -Stable mood appreciated -Will continue the use of antidepressant/anxiolytic as per home regimen.  8-mild dementia/insomnia -Continue Namenda and as needed zolpidem.  9-hyperlipidemia -Continue statins.   DVT prophylaxis: Heparin Code Status: Full code Family Communication: No family at bedside. Disposition:   Status is: Inpatient  Dispo: The patient is from: Home              Anticipated d/c is to: Home              Anticipated d/c date is: To be determined              Patient currently is not medically stable for discharge currently still having abdominal pain and intermittent nausea;  no passing gas or having bowel movement yet.  Continue conservative management and recommendation by general surgery.       Consultants:   General surgery   Procedures:  See below for x-ray reports.   Antimicrobials:  None   Subjective: No further vomiting, still feeling slightly nauseated and complaining  of abdominal pain.  No shortness of breath, no chest pain, no fever.  Objective: Vitals:   03/19/20 1703 03/19/20 2105 03/20/20 0057 03/20/20 0402  BP: (!) 149/80 111/88 (!) 142/59 (!) 141/64  Pulse: (!) 58 95 86 (!) 104  Resp: 18 18 16 18   Temp: 98.5 F (36.9 C) 98.3 F (36.8 C) (!) 96.8 F (36 C) (!) 97 F (36.1 C)  TempSrc: Oral Oral Axillary   SpO2: 95% 93% 93% 95%  Weight: 83.5 kg     Height: 5\' 2"  (1.575 m)       Intake/Output Summary (Last 24 hours) at 03/20/2020 1256 Last data filed at 03/20/2020 0900 Gross per 24 hour  Intake 1751.72 ml  Output --  Net 1751.72 ml   Filed Weights   03/19/20 1122 03/19/20 1703  Weight: 72.6 kg 83.5 kg    Examination:  General exam: Afebrile, reports no further vomiting; still having some nausea and ongoing abdominal discomfort.  No chest pain Respiratory system: Clear to auscultation. Respiratory effort normal.  Patient on room air. Cardiovascular system: S1 & S2 heard, RRR.  No JVD, no rubs, no gallops. Gastrointestinal system: Abdomen is tender to palpation in lower quadrants bilaterally (right more than left); decreased bowel sounds appreciated.  Mild guarding.  No distention. Central nervous system: Alert and oriented. No focal neurological deficits. Extremities: No cyanosis, no edema, no clubbing. Skin: No petechiae. Psychiatry: Judgement and insight appear normal. Mood & affect appropriate.    Data Reviewed: I have personally reviewed following labs and imaging studies  CBC: Recent Labs  Lab 03/19/20 1212 03/20/20 0654  WBC 11.8* 9.3  HGB 12.0 11.0*  HCT 38.8 36.2  MCV 81.3 81.9  PLT 273 237    Basic Metabolic Panel: Recent Labs  Lab 03/19/20 1212 03/20/20 0654  NA 136 135  K 4.0 3.7  CL 94* 101  CO2 28 24  GLUCOSE 137* 117*  BUN 36* 30*  CREATININE 1.65* 1.17*  CALCIUM 9.4 8.3*  MG  --  1.9    GFR: Estimated Creatinine Clearance: 36.5 mL/min (A) (by C-G formula based on SCr of 1.17 mg/dL  (H)).  Liver Function Tests: Recent Labs  Lab 03/19/20 1212 03/20/20 0654  AST 25 19  ALT 18 14  ALKPHOS 78 65  BILITOT 0.5 0.8  PROT 7.9 6.4*  ALBUMIN 4.0 3.1*    CBG: Recent Labs  Lab 03/19/20 2009 03/19/20 2345 03/20/20 0405 03/20/20 0720 03/20/20 1113  GLUCAP 146* 101* 113* 125* 131*     Recent Results (from the past 240 hour(s))  SARS Coronavirus 2 by RT PCR (hospital order, performed in [facility] hospital lab) Nasopharyngeal Nasopharyngeal Swab     Status: None   Collection Time: 03/19/20 12:13 PM   Specimen: Nasopharyngeal Swab  Result Value Ref Range Status   SARS Coronavirus 2 NEGATIVE NEGATIVE Final    Comment: (NOTE) SARS-CoV-2 target nucleic acids are NOT DETECTED.  The SARS-CoV-2 RNA is generally detectable in upper and lower respiratory specimens during the acute phase of infection. The lowest concentration of SARS-CoV-2 viral copies this assay can detect is 250 copies / mL. A negative result does not preclude SARS-CoV-2 infection  and should not be used as the sole basis for treatment or other patient management decisions.  A negative result may occur with improper specimen collection / handling, submission of specimen other than nasopharyngeal swab, presence of viral mutation(s) within the areas targeted by this assay, and inadequate number of viral copies (<250 copies / mL). A negative result must be combined with clinical observations, patient history, and epidemiological information.  Fact Sheet for Patients:   StrictlyIdeas.no  Fact Sheet for Healthcare Providers: BankingDealers.co.za  This test is not yet approved or  cleared by the [place] FDA and has been authorized for detection and/or diagnosis of SARS-CoV-2 by FDA under an Emergency Use Authorization (EUA).  This EUA will remain in effect (meaning this test can be used) for the duration of the COVID-19 declaration under Section  564(b)(1) of the Act, 21 U.S.C. section 360bbb-3(b)(1), unless the authorization is terminated or revoked sooner.  Performed at [facility], [address], [place], [place] [address]      Radiology Studies: CT ABDOMEN PELVIS WO CONTRAST  Result Date: 03/19/2020 CLINICAL DATA:  Acute lower abdominal pain. EXAM: CT ABDOMEN AND PELVIS WITHOUT CONTRAST TECHNIQUE: Multidetector CT imaging of the abdomen and pelvis was performed following the standard protocol without IV contrast. COMPARISON:  None. FINDINGS: Lower chest: No acute abnormality. Hepatobiliary: No focal liver abnormality is seen. No gallstones, gallbladder wall thickening, or biliary dilatation. Pancreas: Unremarkable. No pancreatic ductal dilatation or surrounding inflammatory changes. Spleen: Normal in size without focal abnormality. Adrenals/Urinary Tract: Adrenal glands are unremarkable. Kidneys are normal, without renal calculi, focal lesion, or hydronephrosis. Bladder is unremarkable. Stomach/Bowel: Mild gastric distention is noted. Moderate to severe small bowel dilatation is noted. Definite transition zone is not identified, although most likely is in the pelvis as there is nondilated ileum in this area. The appendix is unremarkable. No colonic dilatation is noted. Mild amount of stool is seen throughout the colon. Vascular/Lymphatic: Aortic atherosclerosis. No enlarged abdominal or pelvic lymph nodes. Reproductive: Status post hysterectomy. No adnexal masses. Other: No abdominal wall hernia or abnormality. No abdominopelvic ascites. Musculoskeletal: No acute or significant osseous findings. IMPRESSION: 1. Moderate to severe small bowel dilatation is noted concerning for distal small bowel obstruction. Definite transition zone is not identified, although most likely is in the pelvis as there is nondilated ileum in this area. Aortic Atherosclerosis (ICD10-I70.0). Electronically Signed   By: Marijo Conception M.D.   On: 03/19/2020 14:18    DG Chest Portable 1 View  Result Date: 03/19/2020 CLINICAL DATA:  Epigastric and chest pain. EXAM: PORTABLE CHEST 1 VIEW COMPARISON:  February 16, 2018 FINDINGS: Cardiomediastinal silhouette is normal. Mediastinal contours appear intact. Chronic elevation of the right hemidiaphragm. There is no evidence of focal airspace consolidation, pleural effusion or pneumothorax. Osseous structures are without acute abnormality. Soft tissues are grossly normal. IMPRESSION: No active disease. Chronic elevation of right hemidiaphragm. Electronically Signed   By: Fidela Salisbury M.D.   On: 03/19/2020 13:10   Scheduled Meds: . amLODipine  10 mg Oral Daily  . bisacodyl  10 mg Rectal Q1200  . busPIRone  10 mg Oral BID  . gabapentin  600 mg Oral TID  . heparin  5,000 Units Subcutaneous Q8H  . insulin aspart  0-5 Units Subcutaneous QHS  . insulin aspart  0-9 Units Subcutaneous Q4H  . memantine  10 mg Oral BID  . mirtazapine  30 mg Oral QHS  . pantoprazole (PROTONIX) IV  40 mg Intravenous Q12H  . primidone  25 mg Oral QHS  . simvastatin  40 mg Oral Daily  . zolpidem  5 mg Oral QHS   Continuous Infusions: . lactated ringers       LOS: 1 day    Time spent: 30 minutes    Barton Dubois, MD [facility]   To contact the attending provider between 7A-7P or the covering provider during after hours 7P-7A, please log into the web site [email or web address] and access using universal [place] password for that web site. If you do not have the password, please call the hospital operator.  03/20/2020, 12:56 PM

## 2020-03-20 NOTE — Consult Note (Addendum)
Reason for Consult: Abdominal Pain, possible SBO Referring Physician: Octaviano Glow, MD  Laurie Mcfarland is an 85 y.o. female with a medical history of dementia DM 2, HTN, dyslipidemia CKD stage III, previous PE (currently taking Eliquis 5mg ) that presented to the [facility] via EMS after 3 day history of abdominal pain with nausea/vomiting. Laurie Mcfarland is unable to recall the onset of her pain and does not remember anything before waking up in the hospital this morning. According to history from her granddaughter, she began having abdominal pain on with nausea/vomiting on Thursday evening that continued until Saturday when her great-grandson called EMS after discovering Laurie Mcfarland to weak to be transported by car. Reported last meal was Thursday evening. Reported last BM was Thursday evening.   Laurie Mcfarland is reports that her pain is sharp and points to her lower abdomen. She reports that it is 10/10 on the pain scale. She reports nausea. Does not recall belching, passing gas or last BM. She does not report chest pain, SOB. She has a previous surgical history of hysterectomy.  ED ordered CT Abdomen w/o was concerning for SBO with mild gastric distension, and moderate-severe bowel dilation without a definitve transition identified.   Past Medical History:  Diagnosis Date  . Anxiety   . Arthritis   . Cataracts, bilateral   . Depression   . Diabetes mellitus   . Diabetic peripheral neuropathy ([place])   . Fibromyalgia   . GERD (gastroesophageal reflux disease)   . Heart burn   . High cholesterol   . Hyperlipidemia   . Hypertension   . Leg swelling   . Shortness of breath    with exertion  . Weight loss   . Wheezing     Past Surgical History:  Procedure Laterality Date  . ABDOMINAL HYSTERECTOMY    . CATARACT EXTRACTION W/PHACO  12/10/2011   Procedure: CATARACT EXTRACTION PHACO AND INTRAOCULAR LENS PLACEMENT (IOC);  Surgeon: Elta Guadeloupe T. Gershon Crane, MD;  Location: [facility];  Service: Ophthalmology;  Laterality:  Left;  CDE=10.72  . CATARACT EXTRACTION W/PHACO  01/14/2012   Procedure: CATARACT EXTRACTION PHACO AND INTRAOCULAR LENS PLACEMENT (IOC);  Surgeon: Elta Guadeloupe T. Gershon Crane, MD;  Location: [facility];  Service: Ophthalmology;  Laterality: Right;  CDE:11.63  . ESOPHAGOGASTRODUODENOSCOPY N/A 07/18/2019   Procedure: ESOPHAGOGASTRODUODENOSCOPY (EGD);  Surgeon: Rogene Houston, MD; multiple gastric and duodenal ulcers, mild Schatzki ring in the distal esophagus, LA grade B esophagitis, irregular Z-line.  Repeat endoscopy in 3 months.  . ESOPHAGOGASTRODUODENOSCOPY (EGD) WITH ESOPHAGEAL DILATION N/A 06/17/2013   VHQ:IONGEX esophagus-status post Venia Minks dilation. Hiatal hernia. Gastric erosions s/p bx  . ESOPHAGOGASTRODUODENOSCOPY (EGD) WITH PROPOFOL N/A 11/25/2019   Procedure: ESOPHAGOGASTRODUODENOSCOPY (EGD) WITH PROPOFOL;  Surgeon: Daneil Dolin, MD; Normal esophagus, erosive gastropathy with some crpe appearance much better than previous EGD, superficial erosions/ulcerations small and scattered in the posterior duodenal bulb and second portion of the duodenum.  Gastric biop.-chronic gastritis w/o H. pylori.  Duodenal biop.-peptic duodenitis/ulceration  . foreign body removal     splinter removed from right side of face    Family History  Problem Relation Age of Onset  . Alzheimer's disease Mother   . Cancer Father   . Diabetes Daughter   . High blood pressure Daughter   . High blood pressure Daughter   . Cancer Sister   . Cancer Brother   . Pseudochol deficiency Neg Hx   . Malignant hyperthermia Neg Hx   . Hypotension Neg Hx   . Anesthesia problems  Neg Hx   . Colon cancer Neg Hx   . Stomach cancer Neg Hx     Social History:  reports that she quit smoking about 25 years ago. She has never used smokeless tobacco. She reports that she does not drink alcohol and does not use drugs.  Allergies:  Allergies  Allergen Reactions  . Penicillins Rash    Has patient had a PCN reaction causing immediate rash,  facial/tongue/throat swelling, SOB or lightheadedness with hypotension: No Has patient had a PCN reaction causing severe rash involving mucus membranes or skin necrosis: No Has patient had a PCN reaction that required hospitalization: No Has patient had a PCN reaction occurring within the last 10 years: No If all of the above answers are "NO", then may proceed with Cephalosporin use.     Medications: I have reviewed the patient's current medications.  Results for orders placed or performed during the hospital encounter of 03/19/20 (from the past 48 hour(s))  Lipase, blood     Status: None   Collection Time: 03/19/20 12:12 PM  Result Value Ref Range   Lipase 33 11 - 51 U/L    Comment: Performed at [facility], [address] [place], [place] [address]  Comprehensive metabolic panel     Status: Abnormal   Collection Time: 03/19/20 12:12 PM  Result Value Ref Range   Sodium 136 135 - 145 mmol/L   Potassium 4.0 3.5 - 5.1 mmol/L   Chloride 94 (L) 98 - 111 mmol/L   CO2 28 22 - 32 mmol/L   Glucose, Bld 137 (H) 70 - 99 mg/dL    Comment: Glucose reference range applies only to samples taken after fasting for at least 8 hours.   BUN 36 (H) 8 - 23 mg/dL   Creatinine, Ser 1.65 (H) 0.44 - 1.00 mg/dL   Calcium 9.4 8.9 - 10.3 mg/dL   Total Protein 7.9 6.5 - 8.1 g/dL   Albumin 4.0 3.5 - 5.0 g/dL   AST 25 15 - 41 U/L   ALT 18 0 - 44 U/L   Alkaline Phosphatase 78 38 - 126 U/L   Total Bilirubin 0.5 0.3 - 1.2 mg/dL   GFR calc non Af Amer 28 (L) >60 mL/min   GFR calc Af Amer 33 (L) >60 mL/min   Anion gap 14 5 - 15    Comment: Performed at [facility], [address] [place], [place] [address]  CBC     Status: Abnormal   Collection Time: 03/19/20 12:12 PM  Result Value Ref Range   WBC 11.8 (H) 4.0 - 10.5 K/uL   RBC 4.77 3.87 - 5.11 MIL/uL   Hemoglobin 12.0 12.0 - 15.0 g/dL   HCT 38.8 36 - 46 %   MCV 81.3 80.0 - 100.0 fL   MCH 25.2 (L) 26.0 - 34.0 pg   MCHC 30.9 30.0 - 36.0 g/dL   RDW 18.6  (H) 11.5 - 15.5 %   Platelets 273 150 - 400 K/uL   nRBC 0.0 0.0 - 0.2 %    Comment: Performed at [facility], [address], [place], [place] [address]  Protime-INR     Status: Abnormal   Collection Time: 03/19/20 12:12 PM  Result Value Ref Range   Prothrombin Time 15.4 (H) 11.4 - 15.2 seconds   INR 1.3 (H) 0.8 - 1.2    Comment: (NOTE) INR goal varies based on device and disease states. Performed at [facility], [address] [place], [place] [address]  SARS Coronavirus 2 by RT PCR (hospital order, performed in [facility] hospital lab) Nasopharyngeal Nasopharyngeal Swab     Status: None   Collection Time: 03/19/20 12:13 PM   Specimen: Nasopharyngeal Swab  Result Value Ref Range   SARS Coronavirus 2 NEGATIVE NEGATIVE    Comment: (NOTE) SARS-CoV-2 target nucleic acids are NOT DETECTED.  The SARS-CoV-2 RNA is generally detectable in upper and lower respiratory specimens during the acute phase of infection. The lowest concentration of SARS-CoV-2 viral copies this assay can detect is 250 copies / mL. A negative result does not preclude SARS-CoV-2 infection and should not be used as the sole basis for treatment or other patient management decisions.  A negative result may occur with improper specimen collection / handling, submission of specimen other than nasopharyngeal swab, presence of viral mutation(s) within the areas targeted by this assay, and inadequate number of viral copies (<250 copies / mL). A negative result must be combined with clinical observations, patient history, and epidemiological information.  Fact Sheet for Patients:   StrictlyIdeas.no  Fact Sheet for Healthcare Providers: BankingDealers.co.za  This test is not yet approved or  cleared by the [place] FDA and has been authorized for detection and/or diagnosis of SARS-CoV-2 by FDA under an Emergency Use Authorization (EUA).  This EUA will remain in  effect (meaning this test can be used) for the duration of the COVID-19 declaration under Section 564(b)(1) of the Act, 21 U.S.C. section 360bbb-3(b)(1), unless the authorization is terminated or revoked sooner.  Performed at [facility], [address] [place], [place] [address]   Glucose, capillary     Status: Abnormal   Collection Time: 03/19/20  7:28 PM  Result Value Ref Range   Glucose-Capillary 126 (H) 70 - 99 mg/dL    Comment: Glucose reference range applies only to samples taken after fasting for at least 8 hours.  Glucose, capillary     Status: Abnormal   Collection Time: 03/19/20  8:09 PM  Result Value Ref Range   Glucose-Capillary 146 (H) 70 - 99 mg/dL    Comment: Glucose reference range applies only to samples taken after fasting for at least 8 hours.   Comment 1 Notify RN    Comment 2 Document in Chart   Glucose, capillary     Status: Abnormal   Collection Time: 03/19/20 11:45 PM  Result Value Ref Range   Glucose-Capillary 101 (H) 70 - 99 mg/dL    Comment: Glucose reference range applies only to samples taken after fasting for at least 8 hours.   Comment 1 Notify RN    Comment 2 Document in Chart   Glucose, capillary     Status: Abnormal   Collection Time: 03/20/20  4:05 AM  Result Value Ref Range   Glucose-Capillary 113 (H) 70 - 99 mg/dL    Comment: Glucose reference range applies only to samples taken after fasting for at least 8 hours.  Magnesium     Status: None   Collection Time: 03/20/20  6:54 AM  Result Value Ref Range   Magnesium 1.9 1.7 - 2.4 mg/dL    Comment: Performed at [facility], [address] [place],  [address]  Comprehensive metabolic panel     Status: Abnormal   Collection Time: 03/20/20  6:54 AM  Result Value Ref Range   Sodium 135 135 - 145 mmol/L   Potassium 3.7 3.5 - 5.1 mmol/L   Chloride 101 98 - 111 mmol/L   CO2 24 22 - 32 mmol/L  Glucose, Bld 117 (H) 70 - 99 mg/dL    Comment: Glucose reference range applies only to samples  taken after fasting for at least 8 hours.   BUN 30 (H) 8 - 23 mg/dL   Creatinine, Ser 1.17 (H) 0.44 - 1.00 mg/dL   Calcium 8.3 (L) 8.9 - 10.3 mg/dL   Total Protein 6.4 (L) 6.5 - 8.1 g/dL   Albumin 3.1 (L) 3.5 - 5.0 g/dL   AST 19 15 - 41 U/L   ALT 14 0 - 44 U/L   Alkaline Phosphatase 65 38 - 126 U/L   Total Bilirubin 0.8 0.3 - 1.2 mg/dL   GFR calc non Af Amer 43 (L) >60 mL/min   GFR calc Af Amer 50 (L) >60 mL/min   Anion gap 10 5 - 15    Comment: Performed at [facility], [address] [place], [place] [address]  CBC     Status: Abnormal   Collection Time: 03/20/20  6:54 AM  Result Value Ref Range   WBC 9.3 4.0 - 10.5 K/uL   RBC 4.42 3.87 - 5.11 MIL/uL   Hemoglobin 11.0 (L) 12.0 - 15.0 g/dL   HCT 36.2 36 - 46 %   MCV 81.9 80.0 - 100.0 fL   MCH 24.9 (L) 26.0 - 34.0 pg   MCHC 30.4 30.0 - 36.0 g/dL   RDW 18.6 (H) 11.5 - 15.5 %   Platelets 251 150 - 400 K/uL   nRBC 0.0 0.0 - 0.2 %    Comment: Performed at [facility], [address], [place], [place] [address]  Glucose, capillary     Status: Abnormal   Collection Time: 03/20/20  7:20 AM  Result Value Ref Range   Glucose-Capillary 125 (H) 70 - 99 mg/dL    Comment: Glucose reference range applies only to samples taken after fasting for at least 8 hours.    CT ABDOMEN PELVIS WO CONTRAST  Result Date: 03/19/2020 CLINICAL DATA:  Acute lower abdominal pain. EXAM: CT ABDOMEN AND PELVIS WITHOUT CONTRAST TECHNIQUE: Multidetector CT imaging of the abdomen and pelvis was performed following the standard protocol without IV contrast. COMPARISON:  None. FINDINGS: Lower chest: No acute abnormality. Hepatobiliary: No focal liver abnormality is seen. No gallstones, gallbladder wall thickening, or biliary dilatation. Pancreas: Unremarkable. No pancreatic ductal dilatation or surrounding inflammatory changes. Spleen: Normal in size without focal abnormality. Adrenals/Urinary Tract: Adrenal glands are unremarkable. Kidneys are normal, without renal  calculi, focal lesion, or hydronephrosis. Bladder is unremarkable. Stomach/Bowel: Mild gastric distention is noted. Moderate to severe small bowel dilatation is noted. Definite transition zone is not identified, although most likely is in the pelvis as there is nondilated ileum in this area. The appendix is unremarkable. No colonic dilatation is noted. Mild amount of stool is seen throughout the colon. Vascular/Lymphatic: Aortic atherosclerosis. No enlarged abdominal or pelvic lymph nodes. Reproductive: Status post hysterectomy. No adnexal masses. Other: No abdominal wall hernia or abnormality. No abdominopelvic ascites. Musculoskeletal: No acute or significant osseous findings. IMPRESSION: 1. Moderate to severe small bowel dilatation is noted concerning for distal small bowel obstruction. Definite transition zone is not identified, although most likely is in the pelvis as there is nondilated ileum in this area. Aortic Atherosclerosis (ICD10-I70.0). Electronically Signed   By: Marijo Conception M.D.   On: 03/19/2020 14:18   DG Chest Portable 1 View  Result Date: 03/19/2020 CLINICAL DATA:  Epigastric and chest pain. EXAM: PORTABLE CHEST 1 VIEW COMPARISON:  February 16, 2018 FINDINGS: Cardiomediastinal silhouette  is normal. Mediastinal contours appear intact. Chronic elevation of the right hemidiaphragm. There is no evidence of focal airspace consolidation, pleural effusion or pneumothorax. Osseous structures are without acute abnormality. Soft tissues are grossly normal. IMPRESSION: No active disease. Chronic elevation of right hemidiaphragm. Electronically Signed   By: Fidela Salisbury M.D.   On: 03/19/2020 13:10    ROS:  Gastrointestinal: positive for abdominal pain, nausea and vomiting  Blood pressure (!) 141/64, pulse (!) 104, temperature (!) 97 F (36.1 C), resp. rate 18, height 5\' 2"  (1.575 m), weight 83.5 kg, SpO2 95 %. Physical Exam:  General: Awake, alert, conversant. HEENT:   Head:[place]/AT  Eyes:  pupils equal and round,   Throat: moist mucus membranes with some frothy saliva Cardiovascular: Normal S1, S2, mild tachycardia Pulmonary: Normal work of breathing, Lungs CTA bilaterally Abdomen: Soft, mildly distended. Hypoactive bowel sounds. Tender to palpation diffusely, but more noted in lower abdomen. Skin: warm and dry without rashes Psych: Congruent mood and affect. Oriented to person only.     Assessment/Plan:  Ms. Amaal Dimartino is an 85 yo female presenting with 3 day history of abdominal pain assciated with nausea/vomiting with CT Abdomen concerning for SBO. With her symptoms, and CT results and previous abdominal surgical history, small bowel obstruction due to adhesions is likely. The differential also includes gastroenteritis although the patient does not recall any changes in bowel movements. She is currently stable, afebrile, and without peritoneal signs.   Plan:  - Conservative management with bowel rest -Continue NPO  - Continue IV hydration -Continue pain medication as needed -Monitor labs and physical exam    Theodis Sato, medical student 03/20/2020, 8:09 AM

## 2020-03-21 ENCOUNTER — Inpatient Hospital Stay (HOSPITAL_COMMUNITY): Payer: Medicare Other

## 2020-03-21 LAB — GLUCOSE, CAPILLARY
Glucose-Capillary: 100 mg/dL — ABNORMAL HIGH (ref 70–99)
Glucose-Capillary: 104 mg/dL — ABNORMAL HIGH (ref 70–99)
Glucose-Capillary: 105 mg/dL — ABNORMAL HIGH (ref 70–99)
Glucose-Capillary: 192 mg/dL — ABNORMAL HIGH (ref 70–99)
Glucose-Capillary: 99 mg/dL (ref 70–99)
Glucose-Capillary: 99 mg/dL (ref 70–99)

## 2020-03-21 MED ORDER — DIATRIZOATE MEGLUMINE & SODIUM 66-10 % PO SOLN
ORAL | Status: AC
  Administered 2020-03-21: 90 mL
  Filled 2020-03-21: qty 90

## 2020-03-21 MED ORDER — MILK AND MOLASSES ENEMA
1.0000 | Freq: Once | RECTAL | Status: AC
  Administered 2020-03-21: 240 mL via RECTAL

## 2020-03-21 MED ORDER — DIATRIZOATE MEGLUMINE & SODIUM 66-10 % PO SOLN: 90.0000 mL | Freq: Once | ORAL | Status: DC

## 2020-03-21 MED ORDER — PROCHLORPERAZINE EDISYLATE 10 MG/2ML IJ SOLN
10.0000 mg | Freq: Four times a day (QID) | INTRAMUSCULAR | Status: DC | PRN
  Administered 2020-03-22 – 2020-03-24 (×2): 10 mg via INTRAVENOUS
  Filled 2020-03-21 (×2): qty 2

## 2020-03-21 NOTE — Progress Notes (Signed)
PROGRESS NOTE    Laurie Mcfarland  AOZ:[id number] DOB: [birth year] DOA: 03/19/2020 PCP: Laurie School, MD (Confirm with patient/family/NH records and if not entered, this HAS to be entered at [facility] point of entry. "No PCP" if truly none.)   Chief Complaint  Patient presents with   Emesis    Brief Narrative: As per H&P written by Dr. Manuella Mcfarland on 03/19/2020 85 y.o. female with medical history significant for mild dementia, fibromyalgia, anxiety/depression, chronic pain, type 2 diabetes, dyslipidemia, hypertension, CKD stage IIIa, history of peptic and duodenal ulcers, and prior PE-currently on Eliquis, who to the ED with nausea and vomiting that began about 2 days ago.  She initially states that her emesis was dark in color, but has now turned brighter green.  She is noted to have abdominal distention and tenderness throughout and she has not been able to eat anything on account of her persistent nausea and vomiting.  She denies any bowel movements in the last 2 days.  She denies any fevers or chills.  She states that she continues to take Eliquis for blood clot previously, but has never been told to discontinue even though she has completed treatment over the course of 12 months.  She has had prior hysterectomy many years ago.   ED Course: Stable vital signs noted and patient is afebrile.  She had received some Zofran and morphine in the ED with some relief of her symptoms.  1 view chest x-ray with no acute findings.  CT of the abdomen pelvis with moderate to severe distal SBO noted.  EDP had discussed with Dr. Arnoldo Mcfarland with general surgery who will assess in a.m.  Patient has not had any nausea or vomiting since arrival to the ED.  Creatinine is 1.65 with baseline 0.9-1.  EKG with mild sinus tachycardia noted.  Assessment & Plan: 1-distal SBO -Continue bowel rest, IV fluid resuscitation and as needed analgesics/antiemetics. -General surgery is on board and will follow further recommendation; planning to  use laxative/enema to achieve bowel movement. -Patient currently without active nausea or vomiting, NG tube orders continue to be on hold for now. -Still having intermittent abdominal pain, no gas and no bowel movements.  2-acute kidney injury on chronic kidney disease a stage IIIa -Baseline creatinine 0.9-1 -Creatinine peaked at 1.65 on presentation -Continue to minimize/avoid nephrotoxic agents-continue IV fluids -No signs or concerns for UTI. -Follow-up renal function trend.  3-type 2 diabetes with neuropathy -Continue sliding scale insulin -Continue the use of gabapentin. -Close monitoring of patient's CBGs, especially while NPO.  4-history of hypertension -Appears to be stable and well-controlled -Continue current antihypertensive regimen, except for losartan and Lasix given acute kidney injury and high chances for dehydration with n.p.o. status.  5-history of chronic pain -Continue pain medications as per home regimen.  6-gastroesophageal reflux disease/peptic ulcer disease -Continue PPI.  7-history of depression/anxiety -Stable mood appreciated -Will continue the use of antidepressant/anxiolytic as per home regimen.  8-mild dementia/insomnia -Continue Namenda and as needed zolpidem.  9-hyperlipidemia -Continue statins.   DVT prophylaxis: Heparin Code Status: Full code Family Communication: No family at bedside. Disposition:   Status is: Inpatient  Dispo: The patient is from: Home              Anticipated d/c is to: Home              Anticipated d/c date is: To be determined              Patient currently is not medically stable  for discharge currently still having abdominal pain and intermittent nausea; no passing gas or having bowel movement yet.  Continue conservative management and recommendation by general surgery.     Consultants:   General surgery   Procedures:  See below for x-ray reports.   Antimicrobials:  None   Subjective: No fever,  no chest pain, no shortness of breath.  Reports no nausea, no vomiting, no bowel movements or ability to pass gas.  Still having intermittent abdominal discomfort.  Objective: Vitals:   03/21/20 0505 03/21/20 1021 03/21/20 1053 03/21/20 1357  BP: 134/62 126/68  (!) 146/54  Pulse: (!) 103 (!) 103  (!) 104  Resp: 20 20  20   Temp: 99.9 F (37.7 C)   98.3 F (36.8 C)  TempSrc: Oral   Oral  SpO2: 92% 93% 94% 94%  Weight:      Height:        Intake/Output Summary (Last 24 hours) at 03/21/2020 1518 Last data filed at 03/20/2020 1900 Gross per 24 hour  Intake 371.6 ml  Output 350 ml  Net 21.6 ml   Filed Weights   03/19/20 1122 03/19/20 1703  Weight: 72.6 kg 83.5 kg    Examination: General exam: No fever, no chest pain, reports no further episode of nausea vomiting.  Still no bowel movements on ability to pass gas.  Continue complaining of abdominal pain. Respiratory system: Clear to auscultation. Respiratory effort normal. Cardiovascular system:RRR. No murmurs, rubs, gallops. Gastrointestinal system: Abdomen is soft, tender to palpation across lower quadrants; no guarding.  No bowel sounds appreciated. Central nervous system: Alert and oriented. No focal neurological deficits. Extremities: No cyanosis, clubbing or edema. Skin: No rashes, no petechiae. Psychiatry: Judgement and insight appear normal. Mood & affect appropriate.    Data Reviewed: I have personally reviewed following labs and imaging studies  CBC: Recent Labs  Lab 03/19/20 1212 03/20/20 0654  WBC 11.8* 9.3  HGB 12.0 11.0*  HCT 38.8 36.2  MCV 81.3 81.9  PLT 273 893    Basic Metabolic Panel: Recent Labs  Lab 03/19/20 1212 03/20/20 0654  NA 136 135  K 4.0 3.7  CL 94* 101  CO2 28 24  GLUCOSE 137* 117*  BUN 36* 30*  CREATININE 1.65* 1.17*  CALCIUM 9.4 8.3*  MG  --  1.9    GFR: Estimated Creatinine Clearance: 36.5 mL/min (A) (by C-G formula based on SCr of 1.17 mg/dL (H)).  Liver Function  Tests: Recent Labs  Lab 03/19/20 1212 03/20/20 0654  AST 25 19  ALT 18 14  ALKPHOS 78 65  BILITOT 0.5 0.8  PROT 7.9 6.4*  ALBUMIN 4.0 3.1*    CBG: Recent Labs  Lab 03/20/20 1958 03/21/20 0013 03/21/20 0408 03/21/20 0733 03/21/20 1117  GLUCAP 99 104* 105* 99 99     Recent Results (from the past 240 hour(s))  SARS Coronavirus 2 by RT PCR (hospital order, performed in [facility] hospital lab) Nasopharyngeal Nasopharyngeal Swab     Status: None   Collection Time: 03/19/20 12:13 PM   Specimen: Nasopharyngeal Swab  Result Value Ref Range Status   SARS Coronavirus 2 NEGATIVE NEGATIVE Final    Comment: (NOTE) SARS-CoV-2 target nucleic acids are NOT DETECTED.  The SARS-CoV-2 RNA is generally detectable in upper and lower respiratory specimens during the acute phase of infection. The lowest concentration of SARS-CoV-2 viral copies this assay can detect is 250 copies / mL. A negative result does not preclude SARS-CoV-2 infection and should not be used as  the sole basis for treatment or other patient management decisions.  A negative result may occur with improper specimen collection / handling, submission of specimen other than nasopharyngeal swab, presence of viral mutation(s) within the areas targeted by this assay, and inadequate number of viral copies (<250 copies / mL). A negative result must be combined with clinical observations, patient history, and epidemiological information.  Fact Sheet for Patients:   StrictlyIdeas.no  Fact Sheet for Healthcare Providers: BankingDealers.co.za  This test is not yet approved or  cleared by the [place] FDA and has been authorized for detection and/or diagnosis of SARS-CoV-2 by FDA under an Emergency Use Authorization (EUA).  This EUA will remain in effect (meaning this test can be used) for the duration of the COVID-19 declaration under Section 564(b)(1) of the Act, 21  U.S.C. section 360bbb-3(b)(1), unless the authorization is terminated or revoked sooner.  Performed at [facility], [address], [place], [place] [address]      Radiology Studies: No results found. Scheduled Meds:  amLODipine  10 mg Oral Daily   atorvastatin  20 mg Oral Daily   bisacodyl  10 mg Rectal Q1200   busPIRone  10 mg Oral BID   diatrizoate meglumine-sodium  90 mL Per NG tube Once   gabapentin  600 mg Oral TID   heparin  5,000 Units Subcutaneous Q8H   insulin aspart  0-5 Units Subcutaneous QHS   insulin aspart  0-9 Units Subcutaneous Q4H   memantine  10 mg Oral BID   mirtazapine  30 mg Oral QHS   pantoprazole (PROTONIX) IV  40 mg Intravenous Q12H   primidone  25 mg Oral QHS   zolpidem  5 mg Oral QHS   Continuous Infusions:    LOS: 2 days    Time spent: 30 minutes    Barton Dubois, MD [facility]   To contact the attending provider between 7A-7P or the covering provider during after hours 7P-7A, please log into the web site [email or web address] and access using universal [place] password for that web site. If you do not have the password, please call the hospital operator.  03/21/2020, 3:18 PM

## 2020-03-21 NOTE — Plan of Care (Signed)

## 2020-03-21 NOTE — Progress Notes (Signed)
Subjective: Patient denies she had a bowel movement yesterday.  She is sitting up at the side of the bed complaining of left-sided abdominal pain.  No nausea or vomiting have been noted.  She is agitated.  Objective: Vital signs in last 24 hours: Temp:  [99.5 F (37.5 C)-99.9 F (37.7 C)] 99.9 F (37.7 C) (07/20 0505) Pulse Rate:  [58-103] 103 (07/20 1021) Resp:  [20] 20 (07/20 1021) BP: (126-134)/(60-78) 126/68 (07/20 1021) SpO2:  [92 %-95 %] 94 % (07/20 1053) Last BM Date: 03/20/20  Intake/Output from previous day: 07/19 0701 - 07/20 0700 In: 371.6 [I.V.:371.6] Out: 350 [Urine:350] Intake/Output this shift: No intake/output data recorded.  General appearance: appears stated age and combative GI: Soft with mild tenderness to discomfort along the left side and left lower quadrant of the abdomen.  No rigidity is noted.  Lab Results:  Recent Labs    03/19/20 1212 03/20/20 0654  WBC 11.8* 9.3  HGB 12.0 11.0*  HCT 38.8 36.2  PLT 273 251   BMET Recent Labs    03/19/20 1212 03/20/20 0654  NA 136 135  K 4.0 3.7  CL 94* 101  CO2 28 24  GLUCOSE 137* 117*  BUN 36* 30*  CREATININE 1.65* 1.17*  CALCIUM 9.4 8.3*   PT/INR Recent Labs    03/19/20 1212  LABPROT 15.4*  INR 1.3*    Studies/Results: CT ABDOMEN PELVIS WO CONTRAST  Result Date: 03/19/2020 CLINICAL DATA:  Acute lower abdominal pain. EXAM: CT ABDOMEN AND PELVIS WITHOUT CONTRAST TECHNIQUE: Multidetector CT imaging of the abdomen and pelvis was performed following the standard protocol without IV contrast. COMPARISON:  None. FINDINGS: Lower chest: No acute abnormality. Hepatobiliary: No focal liver abnormality is seen. No gallstones, gallbladder wall thickening, or biliary dilatation. Pancreas: Unremarkable. No pancreatic ductal dilatation or surrounding inflammatory changes. Spleen: Normal in size without focal abnormality. Adrenals/Urinary Tract: Adrenal glands are unremarkable. Kidneys are normal, without  renal calculi, focal lesion, or hydronephrosis. Bladder is unremarkable. Stomach/Bowel: Mild gastric distention is noted. Moderate to severe small bowel dilatation is noted. Definite transition zone is not identified, although most likely is in the pelvis as there is nondilated ileum in this area. The appendix is unremarkable. No colonic dilatation is noted. Mild amount of stool is seen throughout the colon. Vascular/Lymphatic: Aortic atherosclerosis. No enlarged abdominal or pelvic lymph nodes. Reproductive: Status post hysterectomy. No adnexal masses. Other: No abdominal wall hernia or abnormality. No abdominopelvic ascites. Musculoskeletal: No acute or significant osseous findings. IMPRESSION: 1. Moderate to severe small bowel dilatation is noted concerning for distal small bowel obstruction. Definite transition zone is not identified, although most likely is in the pelvis as there is nondilated ileum in this area. Aortic Atherosclerosis (ICD10-I70.0). Electronically Signed   By: Marijo Conception M.D.   On: 03/19/2020 14:18   DG Chest Portable 1 View  Result Date: 03/19/2020 CLINICAL DATA:  Epigastric and chest pain. EXAM: PORTABLE CHEST 1 VIEW COMPARISON:  February 16, 2018 FINDINGS: Cardiomediastinal silhouette is normal. Mediastinal contours appear intact. Chronic elevation of the right hemidiaphragm. There is no evidence of focal airspace consolidation, pleural effusion or pneumothorax. Osseous structures are without acute abnormality. Soft tissues are grossly normal. IMPRESSION: No active disease. Chronic elevation of right hemidiaphragm. Electronically Signed   By: Fidela Salisbury M.D.   On: 03/19/2020 13:10    Anti-infectives: Anti-infectives (From admission, onward)   None      Assessment/Plan: Impression: Partial small bowel obstruction, dementia Plan: Will order milk of molasses  enema.  In addition, will do small bowel obstruction protocol to assess bowel as patient is a difficult  historian.  Further management is pending those results.  LOS: 2 days    [name] 03/21/2020

## 2020-03-22 ENCOUNTER — Inpatient Hospital Stay (HOSPITAL_COMMUNITY): Payer: Medicare Other | Admitting: Anesthesiology

## 2020-03-22 ENCOUNTER — Other Ambulatory Visit: Payer: Self-pay

## 2020-03-22 ENCOUNTER — Encounter (HOSPITAL_COMMUNITY)
Admission: EM | Disposition: A | Discharge status: Skilled Nursing Facility | Payer: Self-pay | Source: Home / Self Care | Attending: Internal Medicine

## 2020-03-22 HISTORY — PX: LAPAROTOMY: SHX154

## 2020-03-22 HISTORY — PX: LYSIS OF ADHESION: SHX5961

## 2020-03-22 LAB — TYPE AND SCREEN
ABO/RH(D): B POS
Antibody Screen: NEGATIVE

## 2020-03-22 LAB — GLUCOSE, CAPILLARY
Glucose-Capillary: 115 mg/dL — ABNORMAL HIGH (ref 70–99)
Glucose-Capillary: 108 mg/dL — ABNORMAL HIGH (ref 70–99)
Glucose-Capillary: 119 mg/dL — ABNORMAL HIGH (ref 70–99)
Glucose-Capillary: 108 mg/dL — ABNORMAL HIGH (ref 70–99)
Glucose-Capillary: 138 mg/dL — ABNORMAL HIGH (ref 70–99)
Glucose-Capillary: 170 mg/dL — ABNORMAL HIGH (ref 70–99)

## 2020-03-22 SURGERY — LAPAROTOMY, EXPLORATORY
Anesthesia: General | Site: Abdomen

## 2020-03-22 MED ORDER — ACETAMINOPHEN 10 MG/ML IV SOLN
INTRAVENOUS | Status: AC
  Filled 2020-03-22: qty 100

## 2020-03-22 MED ORDER — LACTATED RINGERS IV SOLN: INTRAVENOUS | Status: DC | PRN

## 2020-03-22 MED ORDER — BUPIVACAINE LIPOSOME 1.3 % IJ SUSP
INTRAMUSCULAR | Status: DC | PRN
  Administered 2020-03-22: 20 mL

## 2020-03-22 MED ORDER — HYDROMORPHONE HCL 1 MG/ML IJ SOLN
0.2500 mg | INTRAMUSCULAR | Status: DC | PRN
  Administered 2020-03-22 (×3): 0.5 mg via INTRAVENOUS
  Filled 2020-03-22 (×3): qty 0.5

## 2020-03-22 MED ORDER — PROPOFOL 10 MG/ML IV BOLUS
INTRAVENOUS | Status: DC | PRN
  Administered 2020-03-22: 70 mg via INTRAVENOUS

## 2020-03-22 MED ORDER — BACITRACIN ZINC 500 UNIT/GM EX OINT
TOPICAL_OINTMENT | CUTANEOUS | Status: AC
  Filled 2020-03-22: qty 0.9

## 2020-03-22 MED ORDER — FENTANYL CITRATE (PF) 100 MCG/2ML IJ SOLN
INTRAMUSCULAR | Status: DC | PRN
  Administered 2020-03-22 (×4): 50 ug via INTRAVENOUS

## 2020-03-22 MED ORDER — POVIDONE-IODINE 10 % EX OINT
TOPICAL_OINTMENT | CUTANEOUS | Status: DC | PRN
  Administered 2020-03-22: 1 via TOPICAL

## 2020-03-22 MED ORDER — FENTANYL CITRATE (PF) 100 MCG/2ML IJ SOLN
INTRAMUSCULAR | Status: AC
  Filled 2020-03-22: qty 2

## 2020-03-22 MED ORDER — PHENYLEPHRINE HCL (PRESSORS) 10 MG/ML IV SOLN
INTRAVENOUS | Status: DC | PRN
Start: 2020-03-22 — End: 2020-03-22
  Administered 2020-03-22: 100 ug via INTRAVENOUS

## 2020-03-22 MED ORDER — PHENYLEPHRINE 40 MCG/ML (10ML) SYRINGE FOR IV PUSH (FOR BLOOD PRESSURE SUPPORT)
PREFILLED_SYRINGE | INTRAVENOUS | Status: AC
  Filled 2020-03-22: qty 10

## 2020-03-22 MED ORDER — ROCURONIUM BROMIDE 100 MG/10ML IV SOLN
INTRAVENOUS | Status: DC | PRN
  Administered 2020-03-22: 30 mg via INTRAVENOUS

## 2020-03-22 MED ORDER — LACTATED RINGERS IV SOLN: Freq: Once | INTRAVENOUS | Status: AC

## 2020-03-22 MED ORDER — SODIUM CHLORIDE 0.9 % IV SOLN: INTRAVENOUS | Status: DC

## 2020-03-22 MED ORDER — CHLORHEXIDINE GLUCONATE 0.12 % MT SOLN
15.0000 mL | Freq: Once | OROMUCOSAL | Status: AC
  Administered 2020-03-22: 15 mL via OROMUCOSAL

## 2020-03-22 MED ORDER — METRONIDAZOLE IN NACL 5-0.79 MG/ML-% IV SOLN
500.0000 mg | INTRAVENOUS | Status: AC
  Administered 2020-03-22: 500 mg via INTRAVENOUS

## 2020-03-22 MED ORDER — LACTATED RINGERS IV SOLN: Freq: Once | INTRAVENOUS | Status: DC

## 2020-03-22 MED ORDER — ONDANSETRON HCL 4 MG/2ML IJ SOLN
INTRAMUSCULAR | Status: DC | PRN
  Administered 2020-03-22: 4 mg via INTRAVENOUS

## 2020-03-22 MED ORDER — SUGAMMADEX SODIUM 500 MG/5ML IV SOLN
INTRAVENOUS | Status: DC | PRN
  Administered 2020-03-22: 200 mg via INTRAVENOUS

## 2020-03-22 MED ORDER — BUPIVACAINE LIPOSOME 1.3 % IJ SUSP
INTRAMUSCULAR | Status: AC
  Filled 2020-03-22: qty 20

## 2020-03-22 MED ORDER — SUCCINYLCHOLINE CHLORIDE 20 MG/ML IJ SOLN
INTRAMUSCULAR | Status: DC | PRN
  Administered 2020-03-22: 120 mg via INTRAVENOUS

## 2020-03-22 MED ORDER — LACTATED RINGERS IV BOLUS
1000.0000 mL | Freq: Once | INTRAVENOUS | Status: AC
  Administered 2020-03-22: 1000 mL via INTRAVENOUS

## 2020-03-22 MED ORDER — ORAL CARE MOUTH RINSE: 15.0000 mL | Freq: Once | OROMUCOSAL | Status: AC

## 2020-03-22 MED ORDER — CIPROFLOXACIN IN D5W 400 MG/200ML IV SOLN
400.0000 mg | INTRAVENOUS | Status: AC
  Administered 2020-03-22: 400 mg via INTRAVENOUS

## 2020-03-22 MED ORDER — DEXAMETHASONE SODIUM PHOSPHATE 10 MG/ML IJ SOLN
INTRAMUSCULAR | Status: AC
  Filled 2020-03-22: qty 1

## 2020-03-22 MED ORDER — CHLORHEXIDINE GLUCONATE CLOTH 2 % EX PADS: 6.0000 | MEDICATED_PAD | Freq: Once | CUTANEOUS | Status: DC

## 2020-03-22 MED ORDER — ONDANSETRON HCL 4 MG/2ML IJ SOLN
4.0000 mg | Freq: Once | INTRAMUSCULAR | Status: AC | PRN
  Administered 2020-03-22: 4 mg via INTRAVENOUS
  Filled 2020-03-22: qty 2

## 2020-03-22 MED ORDER — ONDANSETRON HCL 4 MG/2ML IJ SOLN
INTRAMUSCULAR | Status: AC
  Filled 2020-03-22: qty 4

## 2020-03-22 MED ORDER — SODIUM CHLORIDE 0.9 % IR SOLN
Status: DC | PRN
  Administered 2020-03-22: 1000 mL

## 2020-03-22 MED ORDER — LIDOCAINE HCL (CARDIAC) PF 50 MG/5ML IV SOSY
PREFILLED_SYRINGE | INTRAVENOUS | Status: DC | PRN
  Administered 2020-03-22: 100 mg via INTRAVENOUS

## 2020-03-22 MED ORDER — POVIDONE-IODINE 10 % EX OINT
TOPICAL_OINTMENT | CUTANEOUS | Status: AC
  Filled 2020-03-22: qty 1

## 2020-03-22 MED ORDER — METRONIDAZOLE IN NACL 5-0.79 MG/ML-% IV SOLN
INTRAVENOUS | Status: AC
  Filled 2020-03-22: qty 100

## 2020-03-22 MED ORDER — CIPROFLOXACIN IN D5W 400 MG/200ML IV SOLN
INTRAVENOUS | Status: AC
  Filled 2020-03-22: qty 200

## 2020-03-22 MED ORDER — LIDOCAINE 2% (20 MG/ML) 5 ML SYRINGE
INTRAMUSCULAR | Status: AC
  Filled 2020-03-22: qty 5

## 2020-03-22 MED ORDER — PROPOFOL 10 MG/ML IV BOLUS
INTRAVENOUS | Status: AC
  Filled 2020-03-22: qty 20

## 2020-03-22 MED ORDER — DEXAMETHASONE SODIUM PHOSPHATE 10 MG/ML IJ SOLN
INTRAMUSCULAR | Status: DC | PRN
  Administered 2020-03-22: 4 mg via INTRAVENOUS

## 2020-03-22 MED ORDER — METOPROLOL TARTRATE 5 MG/5ML IV SOLN
2.5000 mg | Freq: Two times a day (BID) | INTRAVENOUS | Status: DC
  Administered 2020-03-22 – 2020-03-25 (×6): 2.5 mg via INTRAVENOUS
  Filled 2020-03-22 (×6): qty 5

## 2020-03-22 MED ORDER — ACETAMINOPHEN 10 MG/ML IV SOLN
1000.0000 mg | Freq: Once | INTRAVENOUS | Status: AC
  Administered 2020-03-22: 1000 mg via INTRAVENOUS

## 2020-03-22 MED ORDER — SUCCINYLCHOLINE CHLORIDE 200 MG/10ML IV SOSY
PREFILLED_SYRINGE | INTRAVENOUS | Status: AC
  Filled 2020-03-22: qty 10

## 2020-03-22 SURGICAL SUPPLY — 69 items
APL PRP STRL LF DISP 70% ISPRP (MISCELLANEOUS) ×1
APPLIER CLIP 11 MED OPEN (CLIP)
APPLIER CLIP 13 LRG OPEN (CLIP)
APR CLP LRG 13 20 CLIP (CLIP)
APR CLP MED 11 20 MLT OPN (CLIP)
BARRIER SKIN 2 3/4 (OSTOMY) IMPLANT
BARRIER SKIN 2 3/4 INCH (OSTOMY)
BARRIER SKIN OD2.25 2 3/4 FLNG (OSTOMY) IMPLANT
BRR SKN FLT 2.75X2.25 2 PC (OSTOMY)
CELLS DAT CNTRL 66122 CELL SVR (MISCELLANEOUS) IMPLANT
CHLORAPREP W/TINT 26 (MISCELLANEOUS) ×3 IMPLANT
CLAMP POUCH DRAINAGE QUIET (OSTOMY) IMPLANT
CLIP APPLIE 11 MED OPEN (CLIP) IMPLANT
CLIP APPLIE 13 LRG OPEN (CLIP) IMPLANT
CLOTH BEACON ORANGE TIMEOUT ST (SAFETY) ×3 IMPLANT
COVER LIGHT HANDLE STERIS (MISCELLANEOUS) ×6 IMPLANT
COVER WAND RF STERILE (DRAPES) ×3 IMPLANT
DRAPE WARM FLUID 44X44 (DRAPES) ×3 IMPLANT
DRSG OPSITE POSTOP 4X10 (GAUZE/BANDAGES/DRESSINGS) ×3 IMPLANT
DRSG OPSITE POSTOP 4X8 (GAUZE/BANDAGES/DRESSINGS) ×2 IMPLANT
ELECT BLADE 6 FLAT ULTRCLN (ELECTRODE) IMPLANT
ELECT REM PT RETURN 9FT ADLT (ELECTROSURGICAL) ×3
ELECTRODE REM PT RTRN 9FT ADLT (ELECTROSURGICAL) ×1 IMPLANT
GLOVE BIOGEL PI IND STRL 7.0 (GLOVE) ×2 IMPLANT
GLOVE BIOGEL PI INDICATOR 7.0 (GLOVE) ×4
GLOVE SURG SS PI 7.5 STRL IVOR (GLOVE) ×3 IMPLANT
GOWN STRL REUS W/TWL LRG LVL3 (GOWN DISPOSABLE) ×9 IMPLANT
HANDLE SUCTION POOLE (INSTRUMENTS) IMPLANT
INST SET MAJOR GENERAL (KITS) ×3 IMPLANT
KIT TURNOVER KIT A (KITS) ×3 IMPLANT
LIGASURE IMPACT 36 18CM CVD LR (INSTRUMENTS) ×3 IMPLANT
MANIFOLD NEPTUNE II (INSTRUMENTS) ×3 IMPLANT
NDL HYPO 18GX1.5 BLUNT FILL (NEEDLE) ×1 IMPLANT
NEEDLE HYPO 18GX1.5 BLUNT FILL (NEEDLE) ×3 IMPLANT
NEEDLE HYPO 22GX1.5 SAFETY (NEEDLE) ×3 IMPLANT
NS IRRIG 1000ML POUR BTL (IV SOLUTION) ×6 IMPLANT
PACK MAJOR ABDOMINAL (CUSTOM PROCEDURE TRAY) ×3 IMPLANT
PAD ARMBOARD 7.5X6 YLW CONV (MISCELLANEOUS) ×3 IMPLANT
POUCH OSTOMY 2 3/4  H 3804 (WOUND CARE)
POUCH OSTOMY 2 3/4 H 3804 (WOUND CARE)
POUCH OSTOMY 2 PC DRNBL 2.75 (WOUND CARE) IMPLANT
RELOAD LINEAR CUT PROX 55 BLUE (ENDOMECHANICALS) IMPLANT
RELOAD PROXIMATE 75MM BLUE (ENDOMECHANICALS) IMPLANT
RELOAD STAPLE 55 3.8 BLU REG (ENDOMECHANICALS) IMPLANT
RELOAD STAPLE 75 3.8 BLU REG (ENDOMECHANICALS) IMPLANT
RETRACTOR WND ALEXIS 18 MED (MISCELLANEOUS) ×1 IMPLANT
RETRACTOR WND ALEXIS 25 LRG (MISCELLANEOUS) IMPLANT
RETRACTOR WND ALEXIS-O 25 LRG (MISCELLANEOUS) IMPLANT
RTRCTR WOUND ALEXIS 18CM MED (MISCELLANEOUS)
RTRCTR WOUND ALEXIS 25CM LRG (MISCELLANEOUS) ×3
RTRCTR WOUND ALEXIS O 25CM LRG (MISCELLANEOUS) ×3
SET BASIN LINEN APH (SET/KITS/TRAYS/PACK) ×3 IMPLANT
SPONGE LAP 18X18 RF (DISPOSABLE) ×3 IMPLANT
STAPLER GUN LINEAR PROX 60 (STAPLE) IMPLANT
STAPLER PROXIMATE 55 BLUE (STAPLE) IMPLANT
STAPLER PROXIMATE 75MM BLUE (STAPLE) IMPLANT
STAPLER VISISTAT (STAPLE) ×3 IMPLANT
STAPLER VISISTAT 35W (STAPLE) ×2 IMPLANT
SUCTION POOLE HANDLE (INSTRUMENTS) ×3
SUT CHROMIC 0 SH (SUTURE) IMPLANT
SUT CHROMIC 2 0 SH (SUTURE) IMPLANT
SUT CHROMIC 3 0 SH 27 (SUTURE) IMPLANT
SUT NOVA NAB GS-26 0 60 (SUTURE) ×4 IMPLANT
SUT PDS AB 0 CTX 60 (SUTURE) IMPLANT
SUT SILK 2 0 (SUTURE)
SUT SILK 2-0 18XBRD TIE 12 (SUTURE) IMPLANT
SUT SILK 3 0 SH CR/8 (SUTURE) IMPLANT
SYR 20ML LL LF (SYRINGE) ×6 IMPLANT
TRAY FOLEY MTR SLVR 16FR STAT (SET/KITS/TRAYS/PACK) ×3 IMPLANT

## 2020-03-22 NOTE — Anesthesia Procedure Notes (Signed)
Procedure Name: Intubation Performed by: Tacy Learn, CRNA Pre-anesthesia Checklist: Patient identified, Emergency Drugs available, Suction available, Patient being monitored and Timeout performed Patient Re-evaluated:Patient Re-evaluated prior to induction Preoxygenation: Pre-oxygenation with 100% oxygen Induction Type: IV induction Laryngoscope Size: Miller and 2 Grade View: Grade II Tube size: 7.0 mm Number of attempts: 1 Airway Equipment and Method: Stylet Placement Confirmation: ETT inserted through vocal cords under direct vision,  positive ETCO2,  CO2 detector and breath sounds checked- equal and bilateral Secured at: 20 cm Tube secured with: Tape Dental Injury: Teeth and Oropharynx as per pre-operative assessment

## 2020-03-22 NOTE — Interval H&P Note (Signed)
History and Physical Interval Note:  03/22/2020 9:48 AM  Laurie Mcfarland  has presented today for surgery, with the diagnosis of small bowel obstruction.  The various methods of treatment have been discussed with the patient and family. After consideration of risks, benefits and other options for treatment, the patient has consented to  Procedure(s): EXPLORATORY LAPAROTOMY (N/A) as a surgical intervention.  The patient's history has been reviewed, patient examined, no change in status, stable for surgery.  I have reviewed the patient's chart and labs.  Questions were answered to the patient's satisfaction.     [name]

## 2020-03-22 NOTE — Progress Notes (Signed)
PROGRESS NOTE    Laurie Mcfarland  POE:[id number] DOB: 1935-06-13 DOA: 03/19/2020 PCP: [name], MD (Confirm with patient/family/NH records and if not entered, this HAS to be entered at [facility] point of entry. "No PCP" if truly none.)   Chief Complaint  Patient presents with  . Emesis    Brief Narrative: As per H&P written by [name] on 03/19/2020 85 y.o. female with medical history significant for mild dementia, fibromyalgia, anxiety/depression, chronic pain, type 2 diabetes, dyslipidemia, hypertension, CKD stage IIIa, history of peptic and duodenal ulcers, and prior PE-currently on Eliquis, who to the ED with nausea and vomiting that began about 2 days ago.  She initially states that her emesis was dark in color, but has now turned brighter green.  She is noted to have abdominal distention and tenderness throughout and she has not been able to eat anything on account of her persistent nausea and vomiting.  She denies any bowel movements in the last 2 days.  She denies any fevers or chills.  She states that she continues to take Eliquis for blood clot previously, but has never been told to discontinue even though she has completed treatment over the course of 12 months.  She has had prior hysterectomy many years ago.   ED Course: Stable vital signs noted and patient is afebrile.  She had received some Zofran and morphine in the ED with some relief of her symptoms.  1 view chest x-ray with no acute findings.  CT of the abdomen pelvis with moderate to severe distal SBO noted.  EDP had discussed with Dr. Arnoldo Morale with general surgery who will assess in a.m.  Patient has not had any nausea or vomiting since arrival to the ED.  Creatinine is 1.65 with baseline 0.9-1.  EKG with mild sinus tachycardia noted.  Assessment & Plan: 1-distal SBO -Continue bowel rest, IV fluid resuscitation and as needed analgesics/antiemetics. -Patient currently without active nausea or vomiting.  No fever. -General surgery  is on board and will follow further recommendations. -Still having intermittent abdominal pain, no gas and no bowel movements.  Images suggesting still ongoing obstruction without contrast transit; per discussion with general surgery patient will go to the OR later today for exploratory laparotomy.  2-acute kidney injury on chronic kidney disease a stage IIIa -Baseline creatinine 0.9-1 -Creatinine peaked at 1.65 on presentation -Continue to minimize/avoid nephrotoxic agents-continue IV fluids -No signs or concerns for UTI -Continue to follow renal function trend; currently down to 1.17  3-type 2 diabetes with neuropathy -Continue sliding scale insulin -Continue the use of gabapentin. -Close monitoring of patient's CBGs, especially while NPO.  4-history of hypertension -Appears to be stable and well-controlled -Continue holding losartan and Lasix in the setting of acute kidney injury and high risk for dehydration due to lack of oral intake. -Will add IV metoprolol.  5-history of chronic pain -Continue pain medications as per home regimen.  6-gastroesophageal reflux disease/peptic ulcer disease -Continue PPI; will transition to IV at this moment.  7-history of depression/anxiety -Stable mood appreciated -Will continue the use of antidepressant/anxiolytic as per home regimen when tolerating p.o. medication.  8-mild dementia/insomnia -Continue Namenda and as needed zolpidem once able to tolerate by mouth.  9-hyperlipidemia -Continue statins when able to tolerate p.o.'s..   DVT prophylaxis: Heparin Code Status: Full code Family Communication: No family at bedside. Disposition:   Status is: Inpatient  Dispo: The patient is from: Home  Anticipated d/c is to: Home (hopefully).              Anticipated d/c date is: To be determined              Patient currently is not medically stable for discharge currently still having abdominal pain and small bowel through images  demonstrating persistent obstruction without contrast transit; after reviewing general surgery recommendations patient is cannot be taken to the OR for exploratory laparotomy later today (03/22/20)     Consultants:   General surgery   Procedures:  See below for x-ray reports.   Antimicrobials:  None   Subjective: No fever, no chest pain, no nausea or vomiting.  Continue reporting abdominal pain, no bowel movements, no gas.  Per small bowel follow-through images yesterday afternoon, there is persistence obstruction without contrast transit.  Objective: Vitals:   03/21/20 2039 03/22/20 0541 03/22/20 0950 03/22/20 1002  BP: 136/68 138/62 (!) 144/58   Pulse: (!) 108 99 (!) 109   Resp: 20 20 12    Temp: 99.5 F (37.5 C) 99 F (37.2 C) 98.2 F (36.8 C)   TempSrc: Oral Oral Oral   SpO2: 94% 93% 94%   Weight:    83.5 kg  Height:    5\' 2"  (1.575 m)    Intake/Output Summary (Last 24 hours) at 03/22/2020 1040 Last data filed at 03/22/2020 0900 Gross per 24 hour  Intake 0 ml  Output --  Net 0 ml   Filed Weights   03/19/20 1122 03/19/20 1703 03/22/20 1002  Weight: 72.6 kg 83.5 kg 83.5 kg    Examination:  General exam: Alert, awake, oriented x 3; no fever, no chest pain, no nausea vomiting currently.  Continue to have lack of bowel movements and has been unable to pass gas.  Continues to have intermittent abdominal pain. Respiratory system: Clear to auscultation. Respiratory effort normal. Cardiovascular system:RRR. No murmurs, rubs, gallops.  No JVD on exam. Gastrointestinal system: Abdomen is nondistended, soft and mildly tender to palpation across lower quadrants.  No bowel sounds appreciated on examination. Central nervous system: Alert and oriented. No focal neurological deficits. Extremities: No cyanosis or clubbing. Skin: No rashes, no petechiae. Psychiatry: Judgement and insight appear normal. Mood & affect appropriate.    Data Reviewed: I have personally reviewed  following labs and imaging studies  CBC: Recent Labs  Lab 03/19/20 1212 03/20/20 0654  WBC 11.8* 9.3  HGB 12.0 11.0*  HCT 38.8 36.2  MCV 81.3 81.9  PLT 273 983    Basic Metabolic Panel: Recent Labs  Lab 03/19/20 1212 03/20/20 0654  NA 136 135  K 4.0 3.7  CL 94* 101  CO2 28 24  GLUCOSE 137* 117*  BUN 36* 30*  CREATININE 1.65* 1.17*  CALCIUM 9.4 8.3*  MG  --  1.9    GFR: Estimated Creatinine Clearance: 36.5 mL/min (A) (by C-G formula based on SCr of 1.17 mg/dL (H)).  Liver Function Tests: Recent Labs  Lab 03/19/20 1212 03/20/20 0654  AST 25 19  ALT 18 14  ALKPHOS 78 65  BILITOT 0.5 0.8  PROT 7.9 6.4*  ALBUMIN 4.0 3.1*    CBG: Recent Labs  Lab 03/21/20 2040 03/22/20 0140 03/22/20 0449 03/22/20 0718 03/22/20 0947  GLUCAP 100* 115* 108* 119* 108*     Recent Results (from the past 240 hour(s))  SARS Coronavirus 2 by RT PCR (hospital order, performed in [facility] hospital lab) Nasopharyngeal Nasopharyngeal Swab     Status: None  Collection Time: 03/19/20 12:13 PM   Specimen: Nasopharyngeal Swab  Result Value Ref Range Status   SARS Coronavirus 2 NEGATIVE NEGATIVE Final    Comment: (NOTE) SARS-CoV-2 target nucleic acids are NOT DETECTED.  The SARS-CoV-2 RNA is generally detectable in upper and lower respiratory specimens during the acute phase of infection. The lowest concentration of SARS-CoV-2 viral copies this assay can detect is 250 copies / mL. A negative result does not preclude SARS-CoV-2 infection and should not be used as the sole basis for treatment or other patient management decisions.  A negative result may occur with improper specimen collection / handling, submission of specimen other than nasopharyngeal swab, presence of viral mutation(s) within the areas targeted by this assay, and inadequate number of viral copies (<250 copies / mL). A negative result must be combined with clinical observations, patient history, and  epidemiological information.  Fact Sheet for Patients:   StrictlyIdeas.no  Fact Sheet for Healthcare Providers: BankingDealers.co.za  This test is not yet approved or  cleared by the [place] FDA and has been authorized for detection and/or diagnosis of SARS-CoV-2 by FDA under an Emergency Use Authorization (EUA).  This EUA will remain in effect (meaning this test can be used) for the duration of the COVID-19 declaration under Section 564(b)(1) of the Act, 21 U.S.C. section 360bbb-3(b)(1), unless the authorization is terminated or revoked sooner.  Performed at [facility], [address], [place], [place] [address]      Radiology Studies: DG Abd Portable 1V-Small Bowel Obstruction Protocol-initial, 8 hr delay  Result Date: 03/21/2020 CLINICAL DATA:  Small-bowel obstruction EXAM: PORTABLE ABDOMEN - 1 VIEW COMPARISON:  03/19/2020 FINDINGS: Two supine frontal views of the abdomen and pelvis are obtained 8 hours after the administration of oral contrast. Contrast remains within the stomach and proximal duodenum. There is diffuse gaseous distension of the distal small bowel measuring up to 4.2 cm in diameter. Minimal gas and stool within the colon. IMPRESSION: 1. Persistent small bowel dilatation, with no significant transit of oral contrast compatible with small-bowel obstruction. Electronically Signed   By: Randa Ngo M.D.   On: 03/21/2020 21:11   Scheduled Meds: . [MAR Hold] amLODipine  10 mg Oral Daily  . [MAR Hold] atorvastatin  20 mg Oral Daily  . [MAR Hold] bisacodyl  10 mg Rectal Q1200  . [MAR Hold] busPIRone  10 mg Oral BID  . Chlorhexidine Gluconate Cloth  6 each Topical Once   And  . Chlorhexidine Gluconate Cloth  6 each Topical Once  . [MAR Hold] diatrizoate meglumine-sodium  90 mL Per NG tube Once  . [MAR Hold] gabapentin  600 mg Oral TID  . [MAR Hold] heparin  5,000 Units Subcutaneous Q8H  . [MAR Hold] insulin aspart   0-5 Units Subcutaneous QHS  . [MAR Hold] insulin aspart  0-9 Units Subcutaneous Q4H  . [MAR Hold] memantine  10 mg Oral BID  . [MAR Hold] mirtazapine  30 mg Oral QHS  . [MAR Hold] pantoprazole (PROTONIX) IV  40 mg Intravenous Q12H  . [MAR Hold] primidone  25 mg Oral QHS  . [MAR Hold] zolpidem  5 mg Oral QHS   Continuous Infusions: . metronidazole     And  . ciprofloxacin    . lactated ringers       LOS: 3 days    Time spent: 30 minutes    Barton Dubois, MD [facility]   To contact the attending provider between 7A-7P or the covering provider during after hours  7P-7A, please log into the web site [email or web address] and access using universal [place] password for that web site. If you do not have the password, please call the hospital operator.  03/22/2020, 10:40 AM

## 2020-03-22 NOTE — Anesthesia Postprocedure Evaluation (Signed)
Anesthesia Post Note  Patient: Laurie Mcfarland  Procedure(s) Performed: EXPLORATORY LAPAROTOMY LYSIS OF ADHESIONS (N/A Abdomen)  Patient location during evaluation: PACU Anesthesia Type: General Level of consciousness: awake, awake and alert, patient cooperative, confused and responds to stimulation Pain management: pain level controlled Vital Signs Assessment: post-procedure vital signs reviewed and stable Respiratory status: spontaneous breathing, respiratory function stable, nonlabored ventilation and patient connected to nasal cannula oxygen Cardiovascular status: blood pressure returned to baseline and stable Postop Assessment: no headache and no backache Anesthetic complications: no   No complications documented.   Last Vitals:  Vitals:   03/22/20 0541 03/22/20 0950  BP: 138/62 (!) 144/58  Pulse: 99 (!) 109  Resp: 20 12  Temp: 37.2 C 36.8 C  SpO2: 93% 94%    Last Pain:  Vitals:   03/22/20 0950  TempSrc: Oral  PainSc: 5                  Tacy Learn

## 2020-03-22 NOTE — Transfer of Care (Signed)
Immediate Anesthesia Transfer of Care Note  Patient: Laurie Mcfarland  Procedure(s) Performed: EXPLORATORY LAPAROTOMY LYSIS OF ADHESIONS (N/A Abdomen)  Patient Location: PACU  Anesthesia Type:General  Level of Consciousness: awake, alert , oriented and patient cooperative  Airway & Oxygen Therapy: Patient Spontanous Breathing and Patient connected to nasal cannula oxygen  Post-op Assessment: Report given to RN, Post -op Vital signs reviewed and stable and Patient moving all extremities  Post vital signs: Reviewed and stable  Last Vitals:  Vitals Value Taken Time  BP 148/50 03/22/20 1221  Temp    Pulse 87 03/22/20 1224  Resp 16 03/22/20 1225  SpO2 92 % 03/22/20 1224  Vitals shown include unvalidated device data.  Last Pain:  Vitals:   03/22/20 0950  TempSrc: Oral  PainSc: 5       Patients Stated Pain Goal: 3 (75/17/00 1749)  Complications: No complications documented.

## 2020-03-22 NOTE — Op Note (Signed)
Patient:  Laurie Mcfarland  DOB:  12-02-1934  MRN:  [id number]   Preop Diagnosis: Small bowel obstruction  Postop Diagnosis: Same, adhesive disease  Procedure: Exploratory laparotomy, lysis of adhesions  Surgeon: [name], MD  Anes: General endotracheal  Indications: Patient is an 85 year old white female who presented to [facility] with worsening nausea and vomiting.  Initial CT scan the abdomen revealed a small bowel obstruction most likely secondary to adhesive disease in the pelvis.  She had a small bowel series done yesterday evening which shows persistence of the bowel obstruction.  She now presents for exploratory laparotomy.  The risks and benefits of the procedure including bleeding, infection, and the possibility of a bowel resection were fully explained to the patient, who gave informed consent.  Procedure note: The patient was placed in the supine position.  After induction of general endotracheal anesthesia, the abdomen was prepped and draped using the usual sterile technique with ChloraPrep.  Surgical site confirmation was performed.  A midline incision was made from the umbilicus to the suprapubic region.  The peritoneal cavity was entered into without difficulty.  It was noted that the proximal small bowel was dilated.  I then proceeded to exteriorize the bowel and 2 areas of obstruction were noted due to omentum.  There were several loops of bowel involved.  Once the omentum and the scar tissue was lysed, the bowel was freed from of the obstruction.  The bowel was then followed down to the terminal ileum.  The strictured areas of the small bowel were not critical.  I then ran the bowel and milked it from the terminal ileum to the ligament of Treitz.  Approximately 1.5 L of fluid was removed with the OG tube.  The bowel was then inspected from the ligament of Treitz to the terminal ileum and there were no serosal tears.  No ischemic areas were noted.  The bowel was  returned into the abdominal cavity in an orderly fashion.  The fascia was reapproximated using a looped 0 Novafil running suture.  Subcutaneous layer was irrigated with normal saline.  Exparel was instilled into the surrounding wound.  The skin was closed using staples.  Betadine ointment and a dry sterile dressing were applied.  All tape and needle counts were correct at the end of the procedure.  The patient was extubated in the operating room and transferred to PACU in stable condition.  Complications: None  EBL: Minimal  Specimen: None

## 2020-03-22 NOTE — Progress Notes (Signed)
Patient's small bowel follow-through yesterday evening shows a persistent small bowel obstruction without contrast transit.  Will take patient to the operating room today for an exploratory laparotomy.  The risks and benefits of the procedure including bleeding, infection, and the possibility of recurrence of the obstruction were fully explained to the patient and granddaughter, who gave informed consent.

## 2020-03-22 NOTE — H&P (View-Only) (Signed)
Patient's small bowel follow-through yesterday evening shows a persistent small bowel obstruction without contrast transit.  Will take patient to the operating room today for an exploratory laparotomy.  The risks and benefits of the procedure including bleeding, infection, and the possibility of recurrence of the obstruction were fully explained to the patient and granddaughter, who gave informed consent.

## 2020-03-22 NOTE — Care Management Important Message (Signed)
Important Message  Patient Details  Name: Laurie Mcfarland MRN: [id number] Date of Birth: July 23, 1935   Medicare Important Message Given:  Yes     Tommy Medal 03/22/2020, 2:08 PM

## 2020-03-22 NOTE — Anesthesia Preprocedure Evaluation (Addendum)
Anesthesia Evaluation  Patient identified by MRN, date of birth, ID band Patient awake    Reviewed: Allergy & Precautions, NPO status , Patient's Chart, lab work & pertinent test results  History of Anesthesia Complications Negative for: history of anesthetic complications  Airway Mallampati: II  TM Distance: >3 FB Neck ROM: Full    Dental  (+) Upper Dentures, Partial Lower   Pulmonary shortness of breath and with exertion, former smoker, PE   Pulmonary exam normal breath sounds clear to auscultation       Cardiovascular Exercise Tolerance: Poor hypertension, Pt. on medications  Rhythm:Irregular Rate:Tachycardia - Systolic murmurs, - Diastolic murmurs, - Friction Rub, - Carotid Bruit, - Peripheral Edema and - Systolic Click Left ventricle: The cavity size was normal. Systolic function was vigorous. The estimated ejection fraction was in the range of 65%  to 70%. Wall motion was normal; there were no regional wall motion abnormalities. Left ventricular diastolic function parameters were normal.  - Atrial septum: No defect or patent foramen ovale was identified.   19-Mar-2020 11:25:39 [facility]-ED ROUTINE RECORD Sinus tachycardia with Premature atrial complexes Nonspecific ST abnormality Abnormal ECG No STEMI Confirmed by Octaviano Glow [phone]) on 03/19/2020 11:50:37 AM   Neuro/Psych PSYCHIATRIC DISORDERS Anxiety Depression Dementia Encephalopathy   Neuromuscular disease CVA    GI/Hepatic PUD, GERD  Medicated,Small bowel obstruction    Endo/Other  diabetes, Well Controlled, Type 2  Renal/GU Renal InsufficiencyRenal disease     Musculoskeletal  (+) Arthritis , Fibromyalgia -  Abdominal (+) - obese,   Peds  Hematology  (+) anemia ,   Anesthesia Other Findings   Reproductive/Obstetrics                        Anesthesia Physical Anesthesia Plan  ASA: IV and  emergent  Anesthesia Plan: General   Post-op Pain Management:    Induction: Intravenous, Rapid sequence and Cricoid pressure planned  PONV Risk Score and Plan: 4 or greater and Ondansetron and Dexamethasone  Airway Management Planned: Oral ETT  Additional Equipment:   Intra-op Plan:   Post-operative Plan: Extubation in OR and Possible Post-op intubation/ventilation  Informed Consent: I have reviewed the patients History and Physical, chart, labs and discussed the procedure including the risks, benefits and alternatives for the proposed anesthesia with the patient or authorized representative who has indicated his/her understanding and acceptance.     Dental advisory given and Consent reviewed with POA  Plan Discussed with: CRNA and Surgeon  Anesthesia Plan Comments:      Anesthesia Quick Evaluation

## 2020-03-22 NOTE — OR Nursing (Signed)
Vomited 300 cc dark green bile fliuds

## 2020-03-23 ENCOUNTER — Encounter (HOSPITAL_COMMUNITY): Payer: Self-pay | Admitting: General Surgery

## 2020-03-23 LAB — BASIC METABOLIC PANEL
Anion gap: 14 (ref 5–15)
BUN: 22 mg/dL (ref 8–23)
CO2: 23 mmol/L (ref 22–32)
Calcium: 8.3 mg/dL — ABNORMAL LOW (ref 8.9–10.3)
Chloride: 101 mmol/L (ref 98–111)
Creatinine, Ser: 0.92 mg/dL (ref 0.44–1.00)
GFR calc Af Amer: >60 mL/min (ref >60)
GFR calc non Af Amer: 58 mL/min — ABNORMAL LOW (ref >60)
Glucose, Bld: 128 mg/dL — ABNORMAL HIGH (ref 70–99)
Potassium: 3.8 mmol/L (ref 3.5–5.1)
Sodium: 138 mmol/L (ref 135–145)

## 2020-03-23 LAB — GLUCOSE, CAPILLARY
Glucose-Capillary: 122 mg/dL — ABNORMAL HIGH (ref 70–99)
Glucose-Capillary: 134 mg/dL — ABNORMAL HIGH (ref 70–99)
Glucose-Capillary: 113 mg/dL — ABNORMAL HIGH (ref 70–99)
Glucose-Capillary: 125 mg/dL — ABNORMAL HIGH (ref 70–99)
Glucose-Capillary: 126 mg/dL — ABNORMAL HIGH (ref 70–99)
Glucose-Capillary: 115 mg/dL — ABNORMAL HIGH (ref 70–99)

## 2020-03-23 LAB — MAGNESIUM: Magnesium: 1.7 mg/dL (ref 1.7–2.4)

## 2020-03-23 LAB — CBC
HCT: 31.6 % — ABNORMAL LOW (ref 36.0–46.0)
Hemoglobin: 9.9 g/dL — ABNORMAL LOW (ref 12.0–15.0)
MCH: 25.6 pg — ABNORMAL LOW (ref 26.0–34.0)
MCHC: 31.3 g/dL (ref 30.0–36.0)
MCV: 81.7 fL (ref 80.0–100.0)
Platelets: 216 10*3/uL (ref 150–400)
RBC: 3.87 MIL/uL (ref 3.87–5.11)
RDW: 18.4 % — ABNORMAL HIGH (ref 11.5–15.5)
WBC: 7.7 10*3/uL (ref 4.0–10.5)
nRBC: 0 % (ref 0.0–0.2)

## 2020-03-23 LAB — PHOSPHORUS: Phosphorus: 2.5 mg/dL (ref 2.5–4.6)

## 2020-03-23 MED ORDER — QUETIAPINE FUMARATE 25 MG PO TABS
12.5000 mg | ORAL_TABLET | Freq: Every day | ORAL | Status: DC
  Administered 2020-03-23 – 2020-03-27 (×5): 12.5 mg via ORAL
  Filled 2020-03-23 (×5): qty 1

## 2020-03-23 MED ORDER — KCL IN DEXTROSE-NACL 20-5-0.45 MEQ/L-%-% IV SOLN: INTRAVENOUS | Status: DC

## 2020-03-23 MED ORDER — PANTOPRAZOLE SODIUM 40 MG PO TBEC
40.0000 mg | DELAYED_RELEASE_TABLET | Freq: Every day | ORAL | Status: DC
  Administered 2020-03-23 – 2020-03-28 (×6): 40 mg via ORAL
  Filled 2020-03-23 (×6): qty 1

## 2020-03-23 NOTE — Anesthesia Postprocedure Evaluation (Signed)
Anesthesia Post Note  Patient: Laurie Mcfarland  Procedure(s) Performed: EXPLORATORY LAPAROTOMY (N/A Abdomen) LYSIS OF ADHESIONS (N/A Abdomen)  Patient location during evaluation: Nursing Unit Anesthesia Type: General Level of consciousness: awake and alert and oriented Pain management: pain level controlled Vital Signs Assessment: post-procedure vital signs reviewed and stable Respiratory status: spontaneous breathing Cardiovascular status: blood pressure returned to baseline and stable Postop Assessment: no apparent nausea or vomiting Anesthetic complications: no   No complications documented.   Last Vitals:  Vitals:   03/23/20 0416 03/23/20 0634  BP: (!) 157/75 129/69  Pulse: 98 93  Resp: 20 16  Temp: 37.2 C   SpO2: 95% 93%    Last Pain:  Vitals:   03/22/20 2000  TempSrc:   PainSc: 8                  Irasema Chalk C Ytzel Gubler

## 2020-03-23 NOTE — Progress Notes (Signed)
PROGRESS NOTE    Laurie Mcfarland  WRU:[id number] DOB: [birth year] DOA: 03/19/2020 PCP: [name], MD (Confirm with patient/family/NH records and if not entered, this HAS to be entered at [facility] point of entry. "No PCP" if truly none.)   Chief Complaint  Patient presents with  . Emesis    Brief Narrative: As per H&P written by [name] on 03/19/2020 85 y.o. female with medical history significant for mild dementia, fibromyalgia, anxiety/depression, chronic pain, type 2 diabetes, dyslipidemia, hypertension, CKD stage IIIa, history of peptic and duodenal ulcers, and prior PE-currently on Eliquis, who to the ED with nausea and vomiting that began about 2 days ago.  She initially states that her emesis was dark in color, but has now turned brighter green.  She is noted to have abdominal distention and tenderness throughout and she has not been able to eat anything on account of her persistent nausea and vomiting.  She denies any bowel movements in the last 2 days.  She denies any fevers or chills.  She states that she continues to take Eliquis for blood clot previously, but has never been told to discontinue even though she has completed treatment over the course of 12 months.  She has had prior hysterectomy many years ago.   ED Course: Stable vital signs noted and patient is afebrile.  She had received some Zofran and morphine in the ED with some relief of her symptoms.  1 view chest x-ray with no acute findings.  CT of the abdomen pelvis with moderate to severe distal SBO noted.  EDP had discussed with Dr. Arnoldo Morale with general surgery who will assess in a.m.  Patient has not had any nausea or vomiting since arrival to the ED.  Creatinine is 1.65 with baseline 0.9-1.  EKG with mild sinus tachycardia noted.  Assessment & Plan: 1-distal SBO -Continue to follow as needed analgesics and supportive care. -Patient currently without active nausea or vomiting.  No fever. No gas, no bowel  movement. -Postoperative day 1 after exploratory laparotomy and lysis of adhesions. -Continue to follow postoperative recommendations by general surgery. -Slowly starting clear liquid diet and wait for full return of bowel function.  2-acute kidney injury on chronic kidney disease a stage IIIa -Baseline creatinine 0.9-1 -Creatinine peaked at 1.65 on presentation -Continue to minimize/avoid nephrotoxic agents-continue IV fluids -No signs or concerns for UTI -Continue to follow renal function trend; currently down to 0.92  3-type 2 diabetes with neuropathy -Continue sliding scale insulin -Continue the use of gabapentin. -Close monitoring of patient's CBGs, especially while NPO.  4-history of hypertension -Appears to be stable and well-controlled -Continue holding losartan and Lasix in the setting of acute kidney injury and high risk for dehydration due to lack of oral intake. -Will continue IV metoprolol and follow VS.  5-history of chronic pain -Continue pain medications as per home regimen when able to tolerate full PO regimen.   6-gastroesophageal reflux disease/peptic ulcer disease -Continue PPI; will continue IV route currently.  7-history of depression/anxiety -Stable mood appreciated -Will continue the use of antidepressant/anxiolytic as per home regimen when fully tolerating p.o. medication.  8-mild dementia/insomnia/hospital-acquired delirium -Resume the use of Namenda when fully tolerating p.o. -Continue constant reorientation -Low-dose Seroquel will be started.  9-hyperlipidemia -Continue statins when able to tolerate p.o.'s..   DVT prophylaxis: Heparin Code Status: Full code Family Communication: No family at bedside. Disposition:   Status is: Inpatient  Dispo: The patient is from: Home  Anticipated d/c is to: Home (hopefully).              Anticipated d/c date is: To be determined              Patient currently is not medically stable for  discharge currently still having mild abdominal pain and has no demonstrated ability to tolerate diet or recovery intestinal function status. Status post lysis of adhesion and exploratory laparotomy on 03/22/2020.     Consultants:   General surgery   Procedures:  See below for x-ray reports. Exploratory laparotomy/lysis of adhesions 03/22/2020.   Antimicrobials:  None   Subjective: No fever, no chest pain, no nausea, no vomiting. Reports some minimal discomfort around incisional area. No gas, no bowel movement.  Objective: Vitals:   03/22/20 2057 03/23/20 0416 03/23/20 0634 03/23/20 1600  BP:  (!) 157/75 129/69 (!) 150/61  Pulse:  98 93 95  Resp:  20 16 18   Temp:  98.9 F (37.2 C)  99.1 F (37.3 C)  TempSrc:    Axillary  SpO2: 94% 95% 93% 98%  Weight:      Height:        Intake/Output Summary (Last 24 hours) at 03/23/2020 1708 Last data filed at 03/23/2020 1400 Gross per 24 hour  Intake 973.53 ml  Output 850 ml  Net 123.53 ml   Filed Weights   03/19/20 1122 03/19/20 1703 03/22/20 1002  Weight: 72.6 kg 83.5 kg 83.5 kg    Examination: General exam: Alert, awake, oriented x 2; intermittently confused as per nursing staff report. No fever, tolerated somewhat clear liquid diet. Reports no nausea vomiting. No bowel movements, no gas. Respiratory system: Clear to auscultation. Respiratory effort normal. Good oxygen saturation on room air. Cardiovascular system:RRR. No murmurs, rubs, gallops. No JVD appreciated on exam. Gastrointestinal system: Abdomen is nondistended, sore to palpation around incisional area. Incisions are clean and dry. Decreased bowel sounds appreciated.  Central nervous system: Alert and oriented. No focal neurological deficits. Extremities: No cyanosis or clubbing; no edema. Skin: No rashes, no petechiae. Psychiatry: Mood & affect appropriate.   Data Reviewed: I have personally reviewed following labs and imaging studies  CBC: Recent Labs  Lab  03/19/20 1212 03/20/20 0654 03/23/20 0646  WBC 11.8* 9.3 7.7  HGB 12.0 11.0* 9.9*  HCT 38.8 36.2 31.6*  MCV 81.3 81.9 81.7  PLT 273 251 196    Basic Metabolic Panel: Recent Labs  Lab 03/19/20 1212 03/20/20 0654 03/23/20 0646  NA 136 135 138  K 4.0 3.7 3.8  CL 94* 101 101  CO2 28 24 23   GLUCOSE 137* 117* 128*  BUN 36* 30* 22  CREATININE 1.65* 1.17* 0.92  CALCIUM 9.4 8.3* 8.3*  MG  --  1.9 1.7  PHOS  --   --  2.5    GFR: Estimated Creatinine Clearance: 46.4 mL/min (by C-G formula based on SCr of 0.92 mg/dL).  Liver Function Tests: Recent Labs  Lab 03/19/20 1212 03/20/20 0654  AST 25 19  ALT 18 14  ALKPHOS 78 65  BILITOT 0.5 0.8  PROT 7.9 6.4*  ALBUMIN 4.0 3.1*    CBG: Recent Labs  Lab 03/23/20 0126 03/23/20 0420 03/23/20 0749 03/23/20 1130 03/23/20 1658  GLUCAP 122* 134* 113* 125* 126*     Recent Results (from the past 240 hour(s))  SARS Coronavirus 2 by RT PCR (hospital order, performed in [facility] hospital lab) Nasopharyngeal Nasopharyngeal Swab     Status: None   Collection Time: 03/19/20  12:13 PM   Specimen: Nasopharyngeal Swab  Result Value Ref Range Status   SARS Coronavirus 2 NEGATIVE NEGATIVE Final    Comment: (NOTE) SARS-CoV-2 target nucleic acids are NOT DETECTED.  The SARS-CoV-2 RNA is generally detectable in upper and lower respiratory specimens during the acute phase of infection. The lowest concentration of SARS-CoV-2 viral copies this assay can detect is 250 copies / mL. A negative result does not preclude SARS-CoV-2 infection and should not be used as the sole basis for treatment or other patient management decisions.  A negative result may occur with improper specimen collection / handling, submission of specimen other than nasopharyngeal swab, presence of viral mutation(s) within the areas targeted by this assay, and inadequate number of viral copies (<250 copies / mL). A negative result must be combined with  clinical observations, patient history, and epidemiological information.  Fact Sheet for Patients:   StrictlyIdeas.no  Fact Sheet for Healthcare Providers: BankingDealers.co.za  This test is not yet approved or  cleared by the [place] FDA and has been authorized for detection and/or diagnosis of SARS-CoV-2 by FDA under an Emergency Use Authorization (EUA).  This EUA will remain in effect (meaning this test can be used) for the duration of the COVID-19 declaration under Section 564(b)(1) of the Act, 21 U.S.C. section 360bbb-3(b)(1), unless the authorization is terminated or revoked sooner.  Performed at [facility], [address], [place], [place] [address]      Radiology Studies: DG Abd Portable 1V-Small Bowel Obstruction Protocol-initial, 8 hr delay  Result Date: 03/21/2020 CLINICAL DATA:  Small-bowel obstruction EXAM: PORTABLE ABDOMEN - 1 VIEW COMPARISON:  03/19/2020 FINDINGS: Two supine frontal views of the abdomen and pelvis are obtained 8 hours after the administration of oral contrast. Contrast remains within the stomach and proximal duodenum. There is diffuse gaseous distension of the distal small bowel measuring up to 4.2 cm in diameter. Minimal gas and stool within the colon. IMPRESSION: 1. Persistent small bowel dilatation, with no significant transit of oral contrast compatible with small-bowel obstruction. Electronically Signed   By: Randa Ngo M.D.   On: 03/21/2020 21:11   Scheduled Meds: . heparin  5,000 Units Subcutaneous Q8H  . insulin aspart  0-5 Units Subcutaneous QHS  . insulin aspart  0-9 Units Subcutaneous Q4H  . metoprolol tartrate  2.5 mg Intravenous Q12H  . pantoprazole  40 mg Oral Q1200  . QUEtiapine  12.5 mg Oral QHS   Continuous Infusions: . dextrose 5 % and 0.45 % NaCl with KCl 20 mEq/L 75 mL/hr at 03/23/20 1624     LOS: 4 days    Time spent: 30 minutes    Barton Dubois, MD [facility]   To contact the attending provider between 7A-7P or the covering provider during after hours 7P-7A, please log into the web site [email or web address] and access using universal [place] password for that web site. If you do not have the password, please call the hospital operator.  03/23/2020, 5:08 PM

## 2020-03-23 NOTE — Progress Notes (Signed)
1 Day Post-Op  Subjective: Patient denies any significant incisional pain.  No nausea or vomiting noted.  Objective: Vital signs in last 24 hours: Temp:  [98 F (36.7 C)-98.9 F (37.2 C)] 98.9 F (37.2 C) (07/22 0416) Pulse Rate:  [87-100] 93 (07/22 0634) Resp:  [9-28] 16 (07/22 0634) BP: (129-157)/(50-76) 129/69 (07/22 0634) SpO2:  [92 %-99 %] 93 % (07/22 0634) Last BM Date: 03/20/20  Intake/Output from previous day: 07/21 0701 - 07/22 0700 In: 2933.5 [P.O.:360; I.V.:2173.5; IV Piggyback:400] Out: 3110 [Urine:1300; Blood:10] Intake/Output this shift: No intake/output data recorded.  General appearance: alert, cooperative and no distress Resp: clear to auscultation bilaterally Cardio: regular rate and rhythm, S1, S2 normal, no murmur, click, rub or gallop GI: Soft, incision clean and dry.  Minimal bowel sounds appreciated.  Lab Results:  Recent Labs    03/23/20 0646  WBC 7.7  HGB 9.9*  HCT 31.6*  PLT 216   BMET Recent Labs    03/23/20 0646  NA 138  K 3.8  CL 101  CO2 23  GLUCOSE 128*  BUN 22  CREATININE 0.92  CALCIUM 8.3*   PT/INR No results for input(s): LABPROT, INR in the last 72 hours.  Studies/Results: DG Abd Portable 1V-Small Bowel Obstruction Protocol-initial, 8 hr delay  Result Date: 03/21/2020 CLINICAL DATA:  Small-bowel obstruction EXAM: PORTABLE ABDOMEN - 1 VIEW COMPARISON:  03/19/2020 FINDINGS: Two supine frontal views of the abdomen and pelvis are obtained 8 hours after the administration of oral contrast. Contrast remains within the stomach and proximal duodenum. There is diffuse gaseous distension of the distal small bowel measuring up to 4.2 cm in diameter. Minimal gas and stool within the colon. IMPRESSION: 1. Persistent small bowel dilatation, with no significant transit of oral contrast compatible with small-bowel obstruction. Electronically Signed   By: Randa Ngo M.D.   On: 03/21/2020 21:11    Anti-infectives: Anti-infectives (From  admission, onward)   Start     Dose/Rate Route Frequency Ordered Stop   03/22/20 1015  metroNIDAZOLE (FLAGYL) IVPB 500 mg       "And" Linked Group Details   500 mg 100 mL/hr over 60 Minutes Intravenous On call to O.R. 03/22/20 1003 03/22/20 1125   03/22/20 1015  ciprofloxacin (CIPRO) IVPB 400 mg       "And" Linked Group Details   400 mg 200 mL/hr over 60 Minutes Intravenous On call to O.R. 03/22/20 1003 03/22/20 1124      Assessment/Plan: s/p Procedure(s): EXPLORATORY LAPAROTOMY LYSIS OF ADHESIONS Impression: Postoperative day 1, status post lysis of adhesions.  Patient doing well.  Has good urine output.  Awaiting full return of bowel function.  Will remove Foley catheter.  Will start ambulating patient.  I will be gone this weekend and surgery coverage is available.  LOS: 4 days    [name] 03/23/2020

## 2020-03-23 NOTE — Progress Notes (Signed)
1 Day Post-Op  Subjective:  Laurie Mcfarland is doing well post-operatively with minimal abdominal pain and no pain surrounding her surgical sites. She has not had a bowel movement yet and does not recall passing gas. She would like to get up and out of her bed today.    Objective: Vital signs in last 24 hours: Temp:  [98 F (36.7 C)-98.9 F (37.2 C)] 98.9 F (37.2 C) (07/22 0416) Pulse Rate:  [87-109] 93 (07/22 0634) Resp:  [9-28] 16 (07/22 0634) BP: (129-157)/(50-76) 129/69 (07/22 0634) SpO2:  [92 %-99 %] 93 % (07/22 0634) Weight:  [83.5 kg] 83.5 kg (07/21 1002) Last BM Date: 03/20/20  Intake/Output from previous day: 07/21 0701 - 07/22 0700 In: 2933.5 [P.O.:360; I.V.:2173.5; IV Piggyback:400] Out: 3110 [Urine:1300; Blood:10] Intake/Output this shift: No intake/output data recorded.  General appearance: alert, cooperative and no distress Head: Normocephalic, without obvious abnormality, atraumatic Eyes: normal conjunctiva, anicteric sclera GI: soft, non-distended, mild tenderness to palpation. No warmth or erythema around surgical incision.  Psych: Normal Mood and affect. Alert and Oriented to person, and place.  Lab Results:  Recent Labs    03/23/20 0646  WBC 7.7  HGB 9.9*  HCT 31.6*  PLT 216   BMET Recent Labs    03/23/20 0646  NA 138  K 3.8  CL 101  CO2 23  GLUCOSE 128*  BUN 22  CREATININE 0.92  CALCIUM 8.3*   PT/INR No results for input(s): LABPROT, INR in the last 72 hours.  Studies/Results: DG Abd Portable 1V-Small Bowel Obstruction Protocol-initial, 8 hr delay  Result Date: 03/21/2020 CLINICAL DATA:  Small-bowel obstruction EXAM: PORTABLE ABDOMEN - 1 VIEW COMPARISON:  03/19/2020 FINDINGS: Two supine frontal views of the abdomen and pelvis are obtained 8 hours after the administration of oral contrast. Contrast remains within the stomach and proximal duodenum. There is diffuse gaseous distension of the distal small bowel measuring up to 4.2 cm in diameter.  Minimal gas and stool within the colon. IMPRESSION: 1. Persistent small bowel dilatation, with no significant transit of oral contrast compatible with small-bowel obstruction. Electronically Signed   By: Randa Ngo M.D.   On: 03/21/2020 21:11    Anti-infectives: Anti-infectives (From admission, onward)   Start     Dose/Rate Route Frequency Ordered Stop   03/22/20 1015  metroNIDAZOLE (FLAGYL) IVPB 500 mg       "And" Linked Group Details   500 mg 100 mL/hr over 60 Minutes Intravenous On call to O.R. 03/22/20 1003 03/22/20 1125   03/22/20 1015  ciprofloxacin (CIPRO) IVPB 400 mg       "And" Linked Group Details   400 mg 200 mL/hr over 60 Minutes Intravenous On call to O.R. 03/22/20 1003 03/22/20 1124      Assessment/Plan:  Laurie Mcfarland is 85 yo female post-op 1 day. She is currently doing well. We will remove her foley catheter and continue to monitor her until return of bowel function.  s/p Procedure(s): EXPLORATORY LAPAROTOMY LYSIS OF ADHESIONS d/c foley  Continue clear liquids diet as tolerated Reduce IV fluids to 41mL Labs for tomorrow morning  LOS: 4 days    Theodis Sato 03/23/2020

## 2020-03-23 NOTE — Addendum Note (Signed)
Addendum  created 03/23/20 1001 by Denese Killings, MD   Clinical Note Signed

## 2020-03-24 LAB — BASIC METABOLIC PANEL
Anion gap: 11 (ref 5–15)
BUN: 13 mg/dL (ref 8–23)
CO2: 23 mmol/L (ref 22–32)
Calcium: 8 mg/dL — ABNORMAL LOW (ref 8.9–10.3)
Chloride: 102 mmol/L (ref 98–111)
Creatinine, Ser: 0.74 mg/dL (ref 0.44–1.00)
GFR calc Af Amer: >60 mL/min (ref >60)
GFR calc non Af Amer: >60 mL/min (ref >60)
Glucose, Bld: 100 mg/dL — ABNORMAL HIGH (ref 70–99)
Potassium: 3 mmol/L — ABNORMAL LOW (ref 3.5–5.1)
Sodium: 136 mmol/L (ref 135–145)

## 2020-03-24 LAB — GLUCOSE, CAPILLARY
Glucose-Capillary: 104 mg/dL — ABNORMAL HIGH (ref 70–99)
Glucose-Capillary: 108 mg/dL — ABNORMAL HIGH (ref 70–99)
Glucose-Capillary: 110 mg/dL — ABNORMAL HIGH (ref 70–99)
Glucose-Capillary: 138 mg/dL — ABNORMAL HIGH (ref 70–99)

## 2020-03-24 LAB — CBC
HCT: 30.9 % — ABNORMAL LOW (ref 36.0–46.0)
Hemoglobin: 9.4 g/dL — ABNORMAL LOW (ref 12.0–15.0)
MCH: 24.9 pg — ABNORMAL LOW (ref 26.0–34.0)
MCHC: 30.4 g/dL (ref 30.0–36.0)
MCV: 81.7 fL (ref 80.0–100.0)
Platelets: 226 10*3/uL (ref 150–400)
RBC: 3.78 MIL/uL — ABNORMAL LOW (ref 3.87–5.11)
RDW: 18.3 % — ABNORMAL HIGH (ref 11.5–15.5)
WBC: 7.1 10*3/uL (ref 4.0–10.5)
nRBC: 0 % (ref 0.0–0.2)

## 2020-03-24 LAB — PHOSPHORUS: Phosphorus: 1.8 mg/dL — ABNORMAL LOW (ref 2.5–4.6)

## 2020-03-24 LAB — MAGNESIUM: Magnesium: 1.6 mg/dL — ABNORMAL LOW (ref 1.7–2.4)

## 2020-03-24 MED ORDER — LORAZEPAM 2 MG/ML IJ SOLN
0.5000 mg | Freq: Once | INTRAMUSCULAR | Status: DC
  Filled 2020-03-24: qty 1

## 2020-03-24 NOTE — Progress Notes (Signed)
Subjective:  CC: Laurie Mcfarland is a 84 y.o. female  Hospital stay day 5, 2 Days Post-Op ex-lap, LOA  HPI: No issues overnight.  Pt states pain is worse today, but passing flatus.  ROS:  General: Denies weight loss, weight gain, fatigue, fevers, chills, and night sweats. Heart: Denies chest pain, palpitations, racing heart, irregular heartbeat, leg pain or swelling, and decreased activity tolerance. Respiratory: Denies breathing difficulty, shortness of breath, wheezing, cough, and sputum. GI: Denies change in appetite, heartburn, nausea, vomiting, constipation, diarrhea, and blood in stool. GU: Denies difficulty urinating, pain with urinating, urgency, frequency, blood in urine.   Objective:   Temp:  [98.4 F (36.9 C)-99.1 F (37.3 C)] 98.4 F (36.9 C) (07/23 0925) Pulse Rate:  [94-103] 94 (07/23 0925) Resp:  [18-20] 20 (07/22 2250) BP: (146-155)/(61-77) 146/67 (07/23 0925) SpO2:  [95 %-98 %] 95 % (07/23 0925)     Height: 5\' 2"  (157.5 cm) Weight: 83.5 kg BMI (Calculated): 33.66   Intake/Output this shift:   Intake/Output Summary (Last 24 hours) at 03/24/2020 1028 Last data filed at 03/24/2020 0600 Gross per 24 hour  Intake 728.52 ml  Output 400 ml  Net 328.52 ml    Constitutional :  alert, cooperative, appears stated age and no distress  Respiratory:  clear to auscultation bilaterally  Cardiovascular:  regular rate and rhythm  Gastrointestinal: soft, no guarding, TTP all four quadrants, less while distracted. Staple line /C/D/I  Skin: Cool and moist.   Psychiatric: Normal affect, non-agitated, not confused       LABS:  CMP Latest Ref Rng & Units 03/24/2020 03/23/2020 03/20/2020  Glucose 70 - 99 mg/dL 100(H) 128(H) 117(H)  BUN 8 - 23 mg/dL 13 22 30(H)  Creatinine 0.44 - 1.00 mg/dL 0.74 0.92 1.17(H)  Sodium 135 - 145 mmol/L 136 138 135  Potassium 3.5 - 5.1 mmol/L 3.0(L) 3.8 3.7  Chloride 98 - 111 mmol/L 102 101 101  CO2 22 - 32 mmol/L 23 23 24   Calcium 8.9 - 10.3 mg/dL  8.0(L) 8.3(L) 8.3(L)  Total Protein 6.5 - 8.1 g/dL - - 6.4(L)  Total Bilirubin 0.3 - 1.2 mg/dL - - 0.8  Alkaline Phos 38 - 126 U/L - - 65  AST 15 - 41 U/L - - 19  ALT 0 - 44 U/L - - 14   CBC Latest Ref Rng & Units 03/24/2020 03/23/2020 03/20/2020  WBC 4.0 - 10.5 K/uL 7.1 7.7 9.3  Hemoglobin 12.0 - 15.0 g/dL 9.4(L) 9.9(L) 11.0(L)  Hematocrit 36 - 46 % 30.9(L) 31.6(L) 36.2  Platelets 150 - 400 K/uL 226 216 251    RADS: n/a Assessment:   S/p ex-lap, LOA, for SBO.  Doing well with BM noted today in bed.  TTP maybe from her baseline dementia, since her report of pain has been inconsistent during my exam as well as discussing with RN and family.  With the eyewitnessed BM, should be ok to advance to full liquid today.  Will continue to monitor over weekend.

## 2020-03-24 NOTE — TOC Progression Note (Signed)
Transition of Care [facility]) - Progression Note    Patient Details  Name: Laurie Mcfarland MRN: [id number] Date of Birth: 01/30/1935  [facility] CM/SW Contact  Salome Arnt, [place] Phone Number: 03/24/2020, 3:48 PM  Clinical Narrative:  PT recommending SNF.  LCSW discussed placement with legal guardian, Almyra Free who requests [facility], [facility], or [place]. Will initiate authorization and bed search.     Expected Discharge Plan: [place] Barriers to Discharge: Continued Medical Work up  Expected Discharge Plan and Services Expected Discharge Plan: [place] In-house Referral: Clinical Social Work     Living arrangements for the past 2 months: Single Family Home                                       Social Determinants of Health (SDOH) Interventions    Readmission Risk Interventions Readmission Risk Prevention Plan 03/24/2020  Transportation Screening Complete  HRI or [place] Complete  Social Work Consult for [place] Planning/Counseling Complete  Palliative Care Screening Not Applicable  Medication Review Press photographer) Complete  Some recent data might be hidden

## 2020-03-24 NOTE — TOC Progression Note (Signed)
30 day note  Re: Laurie Mcfarland DOB: 1934/12/12 Date: 03/24/20  Must ID: [id number]  To Whom it May Concern:  Please be advised that the above named patient will require a short-term nursing home stay- anticipated 30 days or less rehabilitation and strengthening. The plan is for return home.

## 2020-03-24 NOTE — Progress Notes (Signed)
Laurie Mcfarland  BHA:[id number] DOB: 27-Oct-1934 DOA: 03/19/2020 PCP: [name], MD (Confirm with patient/family/NH records and if not entered, this HAS to be entered at [facility] point of entry. "No PCP" if truly none.)   Chief Complaint  Patient presents with  . Emesis    Brief Narrative: As per H&P written by [name] on 03/19/2020 85 y.o. female with medical history significant for mild dementia, fibromyalgia, anxiety/depression, chronic pain, type 2 diabetes, dyslipidemia, hypertension, CKD stage IIIa, history of peptic and duodenal ulcers, and prior PE-currently on Eliquis, who to the ED with nausea and vomiting that began about 2 days ago.  She initially states that her emesis was dark in color, but has now turned brighter green.  She is noted to have abdominal distention and tenderness throughout and she has not been able to eat anything on account of her persistent nausea and vomiting.  She denies any bowel movements in the last 2 days.  She denies any fevers or chills.  She states that she continues to take Eliquis for blood clot previously, but has never been told to discontinue even though she has completed treatment over the course of 12 months.  She has had prior hysterectomy many years ago.   ED Course: Stable vital signs noted and patient is afebrile.  She had received some Zofran and morphine in the ED with some relief of her symptoms.  1 view chest x-ray with no acute findings.  CT of the abdomen pelvis with moderate to severe distal SBO noted.  EDP had discussed with Dr. Arnoldo Morale with general surgery who will assess in a.m.  Patient has not had any nausea or vomiting since arrival to the ED.  Creatinine is 1.65 with baseline 0.9-1.  EKG with mild sinus tachycardia noted.  Assessment & Plan: 1-distal SBO -Continue to follow as needed analgesics and supportive care. -Patient currently without active nausea or vomiting.  No fever.  Small bowel movement has been  reported. -Postoperative day 2 after exploratory laparotomy and lysis of adhesions. -Continue to follow postoperative recommendations by general surgery. -Diet has been advanced to full liquid; continue to follow clinical response and full return of her intestinal function.  2-acute kidney injury on chronic kidney disease a stage IIIa -Baseline creatinine 0.9-1 -Creatinine peaked at 1.65 on presentation -Continue to minimize/avoid nephrotoxic agents-continue IV fluids -No signs or concerns for UTI -Continue to follow renal function trend; currently down to 0.92  3-type 2 diabetes with neuropathy -Continue sliding scale insulin -Continue the use of gabapentin. -Close monitoring of patient's CBGs, especially while NPO.  4-history of hypertension -Appears to be stable and well-controlled -Continue holding losartan and Lasix in the setting of acute kidney injury and high risk for dehydration due to lack of oral intake. -Will continue IV metoprolol and follow VS.  5-history of chronic pain -Continue pain medications as per home regimen when able to tolerate full PO regimen.   6-gastroesophageal reflux disease/peptic ulcer disease -Continue PPI; will continue IV route currently.  7-history of depression/anxiety -Stable mood appreciated -Will continue the use of antidepressant/anxiolytic as per home regimen when fully tolerating p.o. medication.  8-mild dementia/insomnia/hospital-acquired delirium -Resume the use of Namenda when fully tolerating p.o. -Continue constant reorientation -Continue low-dose Seroquel will be started.  9-hyperlipidemia -Continue statins when able to tolerate p.o.'s.  10-physical deconditioning -We will ask physical therapy to evaluate and provide recommendations for safe discharge plans.   DVT prophylaxis: Heparin Code Status: Full code Family  Communication: No family at bedside. Disposition:   Status is: Inpatient  Dispo: The patient is from:  Home              Anticipated d/c is to: Home (hopefully).              Anticipated d/c date is: To be determined              Patient currently is not medically stable for discharge currently still having mild abdominal pain and has no demonstrated ability to tolerate diet or recovery intestinal function status. Status post lysis of adhesion and exploratory laparotomy on 03/22/2020.     Consultants:   General surgery   Procedures:  See below for x-ray reports. Exploratory laparotomy/lysis of adhesions 03/22/2020.   Antimicrobials:  None   Subjective: Small bowel movement; no chest pain, no nausea, no vomiting.  Patient is afebrile.  Tolerating clear liquid diets.  Objective: Vitals:   03/23/20 1600 03/23/20 2052 03/23/20 2250 03/24/20 0925  BP: (!) 150/61  (!) 155/77 (!) 146/67  Pulse: 95  103 94  Resp: 18  20   Temp: 99.1 F (37.3 C)  98.8 F (37.1 C) 98.4 F (36.9 C)  TempSrc: Axillary  Oral Oral  SpO2: 98% 98% 96% 95%  Weight:      Height:        Intake/Output Summary (Last 24 hours) at 03/24/2020 1452 Last data filed at 03/24/2020 0600 Gross per 24 hour  Intake 728.52 ml  Output --  Net 728.52 ml   Filed Weights   03/19/20 1122 03/19/20 1703 03/22/20 1002  Weight: 72.6 kg 83.5 kg 83.5 kg    Examination: General exam: Alert, awake, oriented x 2; no chest pain, no nausea or vomiting.  Reports intermittent abdominal pain.  Small bowel movement today.  Tolerating clear liquid diets.  Has remained intermittently confused for nursing staff.  Respiratory system: Clear to auscultation. Respiratory effort normal.  Good O2 sat on room air. Cardiovascular system:RRR. No murmurs, rubs, gallops. Gastrointestinal system: Abdomen is nondistended, tender to palpation around incisions. No organomegaly or masses felt.  Positive decreased bowel sounds. Central nervous system: Alert and oriented. No focal neurological deficits. Extremities: No cyanosis or clubbing. Skin: No  rashes, no petechiae. Psychiatry: Mood & affect appropriate.    Data Reviewed: I have personally reviewed following labs and imaging studies  CBC: Recent Labs  Lab 03/19/20 1212 03/20/20 0654 03/23/20 0646 03/24/20 0414  WBC 11.8* 9.3 7.7 7.1  HGB 12.0 11.0* 9.9* 9.4*  HCT 38.8 36.2 31.6* 30.9*  MCV 81.3 81.9 81.7 81.7  PLT 273 251 216 097    Basic Metabolic Panel: Recent Labs  Lab 03/19/20 1212 03/20/20 0654 03/23/20 0646 03/24/20 0414  NA 136 135 138 136  K 4.0 3.7 3.8 3.0*  CL 94* 101 101 102  CO2 28 24 23 23   GLUCOSE 137* 117* 128* 100*  BUN 36* 30* 22 13  CREATININE 1.65* 1.17* 0.92 0.74  CALCIUM 9.4 8.3* 8.3* 8.0*  MG  --  1.9 1.7 1.6*  PHOS  --   --  2.5 1.8*    GFR: Estimated Creatinine Clearance: 53.4 mL/min (by C-G formula based on SCr of 0.74 mg/dL).  Liver Function Tests: Recent Labs  Lab 03/19/20 1212 03/20/20 0654  AST 25 19  ALT 18 14  ALKPHOS 78 65  BILITOT 0.5 0.8  PROT 7.9 6.4*  ALBUMIN 4.0 3.1*    CBG: Recent Labs  Lab 03/23/20 1130 03/23/20 1658  03/23/20 2323 03/24/20 0800 03/24/20 1204  GLUCAP 125* 126* 115* 104* 108*     Recent Results (from the past 240 hour(s))  SARS Coronavirus 2 by RT PCR (hospital order, performed in [facility] hospital lab) Nasopharyngeal Nasopharyngeal Swab     Status: None   Collection Time: 03/19/20 12:13 PM   Specimen: Nasopharyngeal Swab  Result Value Ref Range Status   SARS Coronavirus 2 NEGATIVE NEGATIVE Final    Comment: (NOTE) SARS-CoV-2 target nucleic acids are NOT DETECTED.  The SARS-CoV-2 RNA is generally detectable in upper and lower respiratory specimens during the acute phase of infection. The lowest concentration of SARS-CoV-2 viral copies this assay can detect is 250 copies / mL. A negative result does not preclude SARS-CoV-2 infection and should not be used as the sole basis for treatment or other patient management decisions.  A negative result may occur with improper  specimen collection / handling, submission of specimen other than nasopharyngeal swab, presence of viral mutation(s) within the areas targeted by this assay, and inadequate number of viral copies (<250 copies / mL). A negative result must be combined with clinical observations, patient history, and epidemiological information.  Fact Sheet for Patients:   StrictlyIdeas.no  Fact Sheet for Healthcare Providers: BankingDealers.co.za  This test is not yet approved or  cleared by the [place] FDA and has been authorized for detection and/or diagnosis of SARS-CoV-2 by FDA under an Emergency Use Authorization (EUA).  This EUA will remain in effect (meaning this test can be used) for the duration of the COVID-19 declaration under Section 564(b)(1) of the Act, 21 U.S.C. section 360bbb-3(b)(1), unless the authorization is terminated or revoked sooner.  Performed at [facility], [address] [place], [place] [address]      Radiology Studies: No results found. Scheduled Meds: . heparin  5,000 Units Subcutaneous Q8H  . insulin aspart  0-5 Units Subcutaneous QHS  . insulin aspart  0-9 Units Subcutaneous Q4H  . LORazepam  0.5 mg Intravenous Once  . metoprolol tartrate  2.5 mg Intravenous Q12H  . pantoprazole  40 mg Oral Q1200  . QUEtiapine  12.5 mg Oral QHS   Continuous Infusions: . dextrose 5 % and 0.45 % NaCl with KCl 20 mEq/L 50 mL/hr at 03/23/20 1950     LOS: 5 days    Time spent: 30 minutes    Barton Dubois, MD [facility]   To contact the attending provider between 7A-7P or the covering provider during after hours 7P-7A, please log into the web site [email or web address] and access using universal [place] password for that web site. If you do not have the password, please call the hospital operator.  03/24/2020, 2:52 PM

## 2020-03-24 NOTE — TOC Initial Note (Signed)
Transition of Care [facility]) - Initial/Assessment Note    Patient Details  Name: Laurie Mcfarland MRN: [id number] Date of Birth: [birth year]  [facility]) CM/SW Contact:    Salome Arnt, LCSW Phone Number: 03/24/2020, 12:44 PM  Clinical Narrative:  LCSW completed assessment due to high risk readmission score. Pt admitted due to SBO and is post-op day 2. LCSW noted pt has legal guardian, Karoline Caldwell with [employer]. Per Almyra Free, pt lives alone and manages fairly well at home. Pt has diagnosis of mild dementia. Her great-grandson, Brandall stays with her at night. Pt's daughter, Jeannene Patella is also very involved. Pt has some confusion following surgery. Almyra Free is concerned about pt's ability to return home alone until she returns to baseline. LCSW discussed with pt's daughter and shared Julie's request that family assist with around the clock supervision short term. Pt's daughter states she is limited due to her own health concerns. Discussed need for PT evaluation for disposition with MD. TOC will follow.                  Expected Discharge Plan: [place] Barriers to Discharge: Continued Medical Work up   Patient Goals and CMS Choice Patient states their goals for this hospitalization and ongoing recovery are:: return to baseline      Expected Discharge Plan and Services Expected Discharge Plan: [place] In-house Referral: Clinical Social Work     Living arrangements for the past 2 months: [place]                                      Prior Living Arrangements/Services Living arrangements for the past 2 months: Single Family Home Lives with:: Self Patient language and need for interpreter reviewed:: Yes Do you feel safe going back to the place where you live?: Yes      Need for Family Participation in Patient Care: Yes (Comment) Care giver support system in place?:  (unsure at this time. Will follow.)    Criminal Activity/Legal Involvement Pertinent to Current Situation/Hospitalization: No - Comment as needed  Activities of Daily Living Home Assistive Devices/Equipment: None ADL Screening (condition at time of admission) Patient's cognitive ability adequate to safely complete daily activities?: Yes Is the patient deaf or have difficulty hearing?: No Does the patient have difficulty seeing, even when wearing glasses/contacts?: No Does the patient have difficulty concentrating, remembering, or making decisions?: No Patient able to express need for assistance with ADLs?: Yes Does the patient have difficulty dressing or bathing?: Yes Independently performs ADLs?: Yes (appropriate for developmental age) Does the patient have difficulty walking or climbing stairs?: No Weakness of Legs: None Weakness of Arms/Hands: None  Permission Sought/Granted                  Emotional Assessment Appearance:: Appears stated age Attitude/Demeanor/Rapport: Unable to Assess Affect (typically observed): Unable to Assess Orientation: : Oriented to Self Alcohol / Substance Use: Not Applicable    Admission diagnosis:  SBO (small bowel obstruction) ([place]) [K56.609] Patient Active Problem List   Diagnosis Date Noted  . Small bowel obstruction ([place]) 03/19/2020  . History of Helicobacter pylori infection 08/19/2019  . Multiple gastric ulcers 08/19/2019  . Multiple duodenal ulcers 08/19/2019  . Abnormal weight loss 08/19/2019  . PUD (peptic ulcer disease)   . Acute blood loss anemia 07/17/2019  . Fibromyalgia 07/17/2019  .  Bilateral pulmonary embolism ([employer]) 02/16/2018  . Diabetic peripheral neuropathy ([place]) 02/16/2018  . Syncope 06/29/2016  . Renal insufficiency 06/29/2016  . Anemia 06/29/2016  . Edema   . Mild dementia ([place]) 03/01/2015  . Gait instability 03/01/2015  . Frequent falls 03/01/2015  . Memory loss 09/03/2014  . HTN (hypertension) 09/03/2014  . Hyperlipidemia 09/03/2014  .  Diabetes type 2, controlled ([place]) 09/03/2014  . Confusion 07/13/2014  . UTI (lower urinary tract infection) 07/13/2014  . Acute encephalopathy 07/13/2014  . Fall at home 07/13/2014  . DM type 2 (diabetes mellitus, type 2) ([place]) 07/13/2014  . Esophageal dysphagia 06/09/2013  . GERD (gastroesophageal reflux disease) 06/09/2013  . Early satiety 06/09/2013   PCP:  [name], MD Pharmacy:   [facility], [place] [address] Phone: [phone] Fax: [phone]     Social Determinants of Health (SDOH) Interventions    Readmission Risk Interventions Readmission Risk Prevention Plan 03/24/2020  Transportation Screening Complete  HRI or [place] Complete  Social Work Consult for [place] Planning/Counseling Complete  Palliative Care Screening Not Applicable  Medication Review [employer]) Complete  Some recent data might be hidden

## 2020-03-24 NOTE — Plan of Care (Signed)
°  Problem: Acute Rehab PT Goals(only PT should resolve) Goal: Patient Will Transfer Sit To/From Stand Outcome: Progressing Flowsheets (Taken 03/24/2020 1540) Patient will transfer sit to/from stand: with minimal assist Goal: Pt Will Transfer Bed To Chair/Chair To Bed Outcome: Progressing Flowsheets (Taken 03/24/2020 1540) Pt will Transfer Bed to Chair/Chair to Bed: with min assist Goal: Pt Will Ambulate Outcome: Progressing Flowsheets (Taken 03/24/2020 1540) Pt will Ambulate:  25 feet  with minimal assist  with least restrictive assistive device Goal: Pt/caregiver will Perform Home Exercise Program Outcome: Progressing Flowsheets (Taken 03/24/2020 1540) Pt/caregiver will Perform Home Exercise Program:  For increased strengthening  For improved balance  Independently  3:41 PM, 03/24/20 Mearl Latin PT, DPT Physical Therapist at [facility]

## 2020-03-24 NOTE — NC FL2 (Signed)
[place] LEVEL OF CARE SCREENING TOOL     IDENTIFICATION  Patient Name: Laurie Mcfarland Birthdate: Jul 29, 1935 Sex: female Admission Date (Current Location): 03/19/2020  [facility] and [place] Number:  [employer] and Address:  [facility] [address], [place]      Provider Number: [id number]  Attending Physician Name and Address:  Barton Dubois, MD  Relative Name and Phone Number:       Current Level of Care: Hospital Recommended Level of Care: [place] Prior Approval Number:    Date Approved/Denied:   PASRR Number:    Discharge Plan: SNF    Current Diagnoses: Patient Active Problem List   Diagnosis Date Noted  . Small bowel obstruction ([place]) 03/19/2020  . History of Helicobacter pylori infection 08/19/2019  . Multiple gastric ulcers 08/19/2019  . Multiple duodenal ulcers 08/19/2019  . Abnormal weight loss 08/19/2019  . PUD (peptic ulcer disease)   . Acute blood loss anemia 07/17/2019  . Fibromyalgia 07/17/2019  . Bilateral pulmonary embolism ([place]) 02/16/2018  . Diabetic peripheral neuropathy ([place]) 02/16/2018  . Syncope 06/29/2016  . Renal insufficiency 06/29/2016  . Anemia 06/29/2016  . Edema   . Mild dementia ([place]) 03/01/2015  . Gait instability 03/01/2015  . Frequent falls 03/01/2015  . Memory loss 09/03/2014  . HTN (hypertension) 09/03/2014  . Hyperlipidemia 09/03/2014  . Diabetes type 2, controlled ([place]) 09/03/2014  . Confusion 07/13/2014  . UTI (lower urinary tract infection) 07/13/2014  . Acute encephalopathy 07/13/2014  . Fall at home 07/13/2014  . DM type 2 (diabetes mellitus, type 2) ([place]) 07/13/2014  . Esophageal dysphagia 06/09/2013  . GERD (gastroesophageal reflux disease) 06/09/2013  . Early satiety 06/09/2013    Orientation RESPIRATION BLADDER Height & Weight     Self  Normal Incontinent Weight: 184 lb 1.4 oz (83.5 kg) Height:  5\' 2"  (157.5 cm)  BEHAVIORAL SYMPTOMS/MOOD  NEUROLOGICAL BOWEL NUTRITION STATUS      Incontinent Diet (Full liquid- see d/c summary for updates)  AMBULATORY STATUS COMMUNICATION OF NEEDS Skin   Limited Assist Verbally Surgical wounds                       Personal Care Assistance Level of Assistance  Bathing, Feeding, Dressing Bathing Assistance: Limited assistance Feeding assistance: Limited assistance Dressing Assistance: Limited assistance     Functional Limitations Info  Sight, Hearing, Speech Sight Info: Adequate Hearing Info: Adequate Speech Info: Adequate    SPECIAL CARE FACTORS FREQUENCY  PT (By licensed PT)     PT Frequency: daily              Contractures Contractures Info: Not present    Additional Factors Info  Code Status, Allergies, Psychotropic Code Status Info: Full code Allergies Info: Penicillins Psychotropic Info: Remeron, Xanax, Namenda, Buspar, Ambien         Current Medications (03/24/2020):  This is the current hospital active medication list Current Facility-Administered Medications  Medication Dose Route Frequency Provider Last Rate Last Admin  . acetaminophen (TYLENOL) tablet 650 mg  650 mg Oral Q6H PRN [name], MD   650 mg at 03/24/20 1405   Or  . acetaminophen (TYLENOL) suppository 650 mg  650 mg Rectal Q6H PRN [name], MD      . dextrose 5 % and 0.45 % NaCl with KCl 20 mEq/L infusion   Intravenous Continuous Barton Dubois, MD 50 mL/hr at 03/23/20 1950 New Bag at 03/23/20 1950  .  heparin injection 5,000 Units  5,000 Units Subcutaneous Q8H [name], MD   5,000 Units at 03/24/20 1406  . insulin aspart (novoLOG) injection 0-5 Units  0-5 Units Subcutaneous QHS [name], MD      . insulin aspart (novoLOG) injection 0-9 Units  0-9 Units Subcutaneous Q4H [name], MD   1 Units at 03/23/20 1748  . LORazepam (ATIVAN) injection 0.5 mg  0.5 mg Intravenous Once Blount, Scarlette Shorts T, NP      . metoprolol tartrate (LOPRESSOR) injection 2.5 mg  2.5 mg Intravenous Q12H  [name], MD   2.5 mg at 03/24/20 1120  . morphine 2 MG/ML injection 2 mg  2 mg Intravenous Q2H PRN [name], MD   2 mg at 03/23/20 0132  . ondansetron (ZOFRAN) tablet 4 mg  4 mg Oral Q6H PRN [name], MD       Or  . ondansetron [facility]) injection 4 mg  4 mg Intravenous Q6H PRN [name], MD   4 mg at 03/21/20 2136  . pantoprazole (PROTONIX) EC tablet 40 mg  40 mg Oral Q1200 [name], MD   40 mg at 03/24/20 1120  . prochlorperazine (COMPAZINE) injection 10 mg  10 mg Intravenous Q6H PRN [name], MD   10 mg at 03/24/20 1120  . QUEtiapine (SEROQUEL) tablet 12.5 mg  12.5 mg Oral QHS Barton Dubois, MD   12.5 mg at 03/23/20 2311     Discharge Medications: Please see discharge summary for a list of discharge medications.  Relevant Imaging Results:  Relevant Lab Results:   Additional Information SSN: [id number]. [place] received per daughter.  Salome Arnt, LCSW

## 2020-03-24 NOTE — Evaluation (Signed)
Physical Therapy Evaluation Patient Details Name: Laurie Mcfarland MRN: [id number] DOB: 07-Mar-1935 Today's Date: 03/24/2020   History of Present Illness  AI SONNENFELD is a 85 y.o. female with medical history significant for mild dementia, fibromyalgia, anxiety/depression, chronic pain, type 2 diabetes, dyslipidemia, hypertension, CKD stage IIIa, history of peptic and duodenal ulcers, and prior PE-currently on Eliquis, who to the ED with nausea and vomiting that began about 2 days ago.  She initially states that her emesis was dark in color, but has now turned brighter green.  She is noted to have abdominal distention and tenderness throughout and she has not been able to eat anything on account of her persistent nausea and vomiting.  She denies any bowel movements in the last 2 days.  She denies any fevers or chills.  She states that she continues to take Eliquis for blood clot previously, but has never been told to discontinue even though she has completed treatment over the course of 12 months.  She has had prior hysterectomy many years ago.    Clinical Impression  Patient limited for functional mobility as stated below secondary to pain, BLE weakness, fatigue and impaired standing balance. Patient seated EOB at beginning of session needing to go to the bathroom. Patient requires min assist to transfer to standing from EOB with RW and demonstrates unsteadiness upon initial standing. Patient ambulates to commode with slow, labored cadence and requires assist to control decent to commode. Patient demonstrates good sitting tolerance on commode but states she feels sick after sitting there for several minutes requiring assist for balance and safety. Patient requires mod assist to transfer to standing from commode and requires min/mod assist for ambulation back to bed secondary to fatigue and impaired activity tolerance. Patient returned to bed and was limited today by pain, fatigue, and impaired activity  tolerance and strength. Patient will benefit from continued physical therapy in hospital and recommended venue below to increase strength, balance, endurance for safe ADLs and gait.     Follow Up Recommendations SNF    Equipment Recommendations  None recommended by PT    Recommendations for Other Services       Precautions / Restrictions Precautions Precautions: Fall Precaution Comments: Exploratory laparotomy, lysis of adhesions on 03/22/20 Restrictions Weight Bearing Restrictions: No      Mobility  Bed Mobility Overal bed mobility: Modified Independent             General bed mobility comments: slow, labored transition from seated EOB to supine  Transfers Overall transfer level: Needs assistance Equipment used: Rolling walker (2 wheeled) Transfers: Sit to/from [facility] Sit to Stand: Min assist;Mod assist Stand pivot transfers: Min assist       General transfer comment: Patient requires min assist to transfer from seated EOB to standing with RW, patient requires mod assist to transfer from commode to standing  Ambulation/Gait Ambulation/Gait assistance: Min assist;Mod assist Gait Distance (Feet): 15 Feet Assistive device: Rolling walker (2 wheeled) Gait Pattern/deviations: Step-through pattern     General Gait Details: slow, labored gait with RW to bathroom from bed and back to bed; fatigued on way back, limited by fatigue and pain  Stairs            Wheelchair Mobility    Modified Rankin (Stroke Patients Only)       Balance Overall balance assessment: Needs assistance Sitting-balance support: Feet supported Sitting balance-Leahy Scale: Good Sitting balance - Comments: seated EOB and commode   Standing balance support: Bilateral  upper extremity supported Standing balance-Leahy Scale: Fair Standing balance comment: fair with RW                             Pertinent Vitals/Pain Pain Assessment: 0-10 Pain Score:  8  Pain Location: stomach Pain Intervention(s): Limited activity within patient's tolerance;Monitored during session;Repositioned;Patient requesting pain meds-RN notified    Home Living Family/patient expects to be discharged to:: Private residence Living Arrangements: Alone Available Help at Discharge: Family;Available PRN/intermittently Type of Home: House Home Access: Stairs to enter Entrance Stairs-Rails: None Entrance Stairs-Number of Steps: 1 Home Layout: One level Home Equipment: Walker - 2 wheels;Cane - single point;Shower seat      Prior Function Level of Independence: Independent         Comments: Patient states independent without AD and community ambulator, does not need assist with ADL     Hand Dominance   Dominant Hand: Right    Extremity/Trunk Assessment   Upper Extremity Assessment Upper Extremity Assessment: Generalized weakness    Lower Extremity Assessment Lower Extremity Assessment: Generalized weakness    Cervical / Trunk Assessment Cervical / Trunk Assessment: Normal  Communication   Communication: No difficulties  Cognition Arousal/Alertness: Awake/alert Behavior During Therapy: WFL for tasks assessed/performed Overall Cognitive Status: Within Functional Limits for tasks assessed                                        General Comments      Exercises     Assessment/Plan    PT Assessment Patient needs continued PT services  PT Problem List Decreased strength;Decreased cognition;Decreased activity tolerance;Decreased balance;Decreased mobility;Decreased safety awareness;Pain       PT Treatment Interventions DME instruction;Balance training;Gait training;Neuromuscular re-education;Stair training;Functional mobility training;Patient/family education;Therapeutic activities;Therapeutic exercise    PT Goals (Current goals can be found in the Care Plan section)  Acute Rehab PT Goals Patient Stated Goal: return home PT  Goal Formulation: With patient Time For Goal Achievement: 04/07/20 Potential to Achieve Goals: Fair    Frequency Min 3X/week   Barriers to discharge        Co-evaluation               AM-PAC PT "6 Clicks" Mobility  Outcome Measure Help needed turning from your back to your side while in a flat bed without using bedrails?: None Help needed moving from lying on your back to sitting on the side of a flat bed without using bedrails?: A Little Help needed moving to and from a bed to a chair (including a wheelchair)?: A Lot Help needed standing up from a chair using your arms (e.g., wheelchair or bedside chair)?: A Little Help needed to walk in hospital room?: A Lot Help needed climbing 3-5 steps with a railing? : Total 6 Click Score: 15    End of Session Equipment Utilized During Treatment: Gait belt Activity Tolerance: Patient limited by fatigue;Patient limited by pain Patient left: in bed;with call bell/phone within reach;with bed alarm set Nurse Communication: Mobility status;Patient requests pain meds PT Visit Diagnosis: Unsteadiness on feet (R26.81);Other abnormalities of gait and mobility (R26.89);Muscle weakness (generalized) (M62.81)    Time: 3295-1884 PT Time Calculation (min) (ACUTE ONLY): 20 min   Charges:   PT Evaluation $PT Eval Low Complexity: 1 Low PT Treatments $Therapeutic Activity: 8-22 mins        3:39 PM, 03/24/20  Mearl Latin PT, DPT Physical Therapist at [facility]

## 2020-03-24 NOTE — Care Management Important Message (Signed)
Important Message  Patient Details  Name: Laurie Mcfarland MRN: [id number] Date of Birth: 10/21/1934   Medicare Important Message Given:  Yes     Tommy Medal 03/24/2020, 12:28 PM

## 2020-03-25 LAB — GLUCOSE, CAPILLARY
Glucose-Capillary: 103 mg/dL — ABNORMAL HIGH (ref 70–99)
Glucose-Capillary: 103 mg/dL — ABNORMAL HIGH (ref 70–99)
Glucose-Capillary: 112 mg/dL — ABNORMAL HIGH (ref 70–99)
Glucose-Capillary: 113 mg/dL — ABNORMAL HIGH (ref 70–99)
Glucose-Capillary: 116 mg/dL — ABNORMAL HIGH (ref 70–99)
Glucose-Capillary: 117 mg/dL — ABNORMAL HIGH (ref 70–99)
Glucose-Capillary: 120 mg/dL — ABNORMAL HIGH (ref 70–99)

## 2020-03-25 MED ORDER — AMLODIPINE BESYLATE 5 MG PO TABS
5.0000 mg | ORAL_TABLET | Freq: Every day | ORAL | Status: DC
  Administered 2020-03-26 – 2020-03-28 (×3): 5 mg via ORAL
  Filled 2020-03-25 (×3): qty 1

## 2020-03-25 MED ORDER — ALPRAZOLAM 0.5 MG PO TABS
0.5000 mg | ORAL_TABLET | Freq: Two times a day (BID) | ORAL | Status: DC | PRN
  Administered 2020-03-27 – 2020-03-28 (×2): 0.5 mg via ORAL
  Filled 2020-03-25 (×2): qty 1

## 2020-03-25 MED ORDER — TEMAZEPAM 7.5 MG PO CAPS
7.5000 mg | ORAL_CAPSULE | Freq: Once | ORAL | Status: AC
  Administered 2020-03-25: 7.5 mg via ORAL
  Filled 2020-03-25: qty 1

## 2020-03-25 MED ORDER — SIMVASTATIN 20 MG PO TABS: 40.0000 mg | ORAL_TABLET | Freq: Every day | ORAL | Status: DC

## 2020-03-25 MED ORDER — GABAPENTIN 300 MG PO CAPS
300.0000 mg | ORAL_CAPSULE | Freq: Three times a day (TID) | ORAL | Status: DC
  Administered 2020-03-25 – 2020-03-28 (×10): 300 mg via ORAL
  Filled 2020-03-25 (×10): qty 1

## 2020-03-25 MED ORDER — MEMANTINE HCL 10 MG PO TABS
10.0000 mg | ORAL_TABLET | Freq: Two times a day (BID) | ORAL | Status: DC
  Administered 2020-03-25 – 2020-03-28 (×7): 10 mg via ORAL
  Filled 2020-03-25 (×7): qty 1

## 2020-03-25 MED ORDER — MIRTAZAPINE 30 MG PO TABS
30.0000 mg | ORAL_TABLET | Freq: Every day | ORAL | Status: DC
  Administered 2020-03-25 – 2020-03-27 (×3): 30 mg via ORAL
  Filled 2020-03-25 (×3): qty 1

## 2020-03-25 MED ORDER — ATORVASTATIN CALCIUM 20 MG PO TABS
20.0000 mg | ORAL_TABLET | Freq: Every day | ORAL | Status: DC
  Administered 2020-03-25 – 2020-03-28 (×4): 20 mg via ORAL
  Filled 2020-03-25 (×4): qty 1

## 2020-03-25 NOTE — Progress Notes (Signed)
PROGRESS NOTE    Laurie Mcfarland  SWN:[id number] DOB: [birth year] DOA: 03/19/2020 PCP: [name], MD (Confirm with patient/family/NH records and if not entered, this HAS to be entered at [facility] point of entry. "No PCP" if truly none.)   Chief Complaint  Patient presents with  . Emesis    Brief Narrative: As per H&P written by [name] on 03/19/2020 85 y.o. female with medical history significant for mild dementia, fibromyalgia, anxiety/depression, chronic pain, type 2 diabetes, dyslipidemia, hypertension, CKD stage IIIa, history of peptic and duodenal ulcers, and prior PE-currently on Eliquis, who to the ED with nausea and vomiting that began about 2 days ago.  She initially states that her emesis was dark in color, but has now turned brighter green.  She is noted to have abdominal distention and tenderness throughout and she has not been able to eat anything on account of her persistent nausea and vomiting.  She denies any bowel movements in the last 2 days.  She denies any fevers or chills.  She states that she continues to take Eliquis for blood clot previously, but has never been told to discontinue even though she has completed treatment over the course of 12 months.  She has had prior hysterectomy many years ago.   ED Course: Stable vital signs noted and patient is afebrile.  She had received some Zofran and morphine in the ED with some relief of her symptoms.  1 view chest x-ray with no acute findings.  CT of the abdomen pelvis with moderate to severe distal SBO noted.  EDP had discussed with Dr. Arnoldo Morale with general surgery who will assess in a.m.  Patient has not had any nausea or vomiting since arrival to the ED.  Creatinine is 1.65 with baseline 0.9-1.  EKG with mild sinus tachycardia noted.  Assessment & Plan: 1-distal SBO -Continue to follow as needed analgesics and supportive care. -Patient currently without active nausea or vomiting.  No fever.  Another bowel movement reported  overnight. -Postoperative day 3 after exploratory laparotomy and lysis of adhesions. -Continue to follow postoperative recommendations by general surgery. -Diet has been advanced to soft diet; continue to follow clinical response and full return of her intestinal function.  2-acute kidney injury on chronic kidney disease a stage IIIa -Baseline creatinine 0.9-1 -Creatinine peaked at 1.65 on presentation -Continue to minimize/avoid nephrotoxic agents -No signs or concerns for UTI -Continue to follow renal function trend; renal function back to baseline currently.  3-type 2 diabetes with neuropathy -Continue sliding scale insulin -Continue the use of gabapentin. -Close monitoring of patient's CBGs, and perform further adjustment to hypoglycemic regimen as required.  4-history of hypertension -Appears to be stable and well-controlled -Resume the use of Norvasc -Follow vital signs and further adjust antihypertensive regimen as needed. -If continues stable and tolerating oral will resume Lasix and losartan.  5-history of chronic pain -Continue pain medications as needed as prescribed by general surgery currently.  Treatment of acute pain in her postoperative will also assist with her chronic back pain.  6-gastroesophageal reflux disease/peptic ulcer disease -Continue PPI  7-history of depression/anxiety -Stable mood appreciated -Will resume home antidepressant/anxiolytic regimen.  8-mild dementia/insomnia/hospital-acquired delirium -Resume the use of Namenda. -Continue constant reorientation -Continue low-dose Seroquel will be started.  9-hyperlipidemia -Continue statins.  10-physical deconditioning -Appreciate assistance and evaluation by physical therapy -Has been recommended skilled nursing facility for further care and conditioning -TOC CM/SW aware and helping with discharge plan. -Patient currently medically stable for that.   DVT  prophylaxis: Heparin Code Status: Full  code Family Communication: No family at bedside. Disposition:   Status is: Inpatient  Dispo: The patient is from: Home              Anticipated d/c is to: SNF              Anticipated d/c date is: To be determined.  Needs SNF              Patient currently is medically stable for discharge currently; will resume home oral hypoglycemic agents and further advance diet as per general surgery recommendations.  Currently no fever, no shortness of breath, no chest pain, no nausea, no vomiting.  Weak and deconditioned requiring rehabilitation at a facility for conditioning.  Appreciate physical therapy evaluation and assessment.  TOC aware and helping with placement.  Status post lysis of adhesion and exploratory laparotomy on 03/22/2020.     Consultants:   General surgery   Procedures:  See below for x-ray reports. Exploratory laparotomy/lysis of adhesions 03/22/2020.   Antimicrobials:  None   Subjective: Feeling better today, no nausea, no vomiting, reports improvement in abdominal pain.  Tolerating full liquid diet.  Patient is afebrile.  Objective: Vitals:   03/24/20 2021 03/24/20 2132 03/25/20 0607 03/25/20 0839  BP:  (!) 152/63 (!) 164/73 (!) 151/72  Pulse:  88 90 88  Resp:  20 20   Temp:  99.1 F (37.3 C) 98.2 F (36.8 C)   TempSrc:  Oral Oral   SpO2: 95% 97% 97% 99%  Weight:      Height:        Intake/Output Summary (Last 24 hours) at 03/25/2020 1412 Last data filed at 03/25/2020 1300 Gross per 24 hour  Intake 480 ml  Output --  Net 480 ml   Filed Weights   03/19/20 1122 03/19/20 1703 03/22/20 1002  Weight: 72.6 kg 83.5 kg 83.5 kg    Examination: General exam: Alert, awake, oriented x 2; reports no nausea, no vomiting and significant improvement in her abdominal discomfort.  Positive bowel movement overnight.  Tolerated full liquid diet.  Patient weak and deconditioned. Respiratory system: Clear to auscultation. Respiratory effort normal. Cardiovascular  system:RRR. No murmurs, rubs, gallops.  No JVD. Gastrointestinal system: Abdomen is nondistended, soft and mildly tender around incisions.  No guarding.  Positive bowel sounds.   Central nervous system: Alert and oriented. No focal neurological deficits. Extremities: No cyanosis or clubbing. Skin: No rashes, no petechiae. Psychiatry: Mood & affect appropriate.   Data Reviewed: I have personally reviewed following labs and imaging studies  CBC: Recent Labs  Lab 03/19/20 1212 03/20/20 0654 03/23/20 0646 03/24/20 0414  WBC 11.8* 9.3 7.7 7.1  HGB 12.0 11.0* 9.9* 9.4*  HCT 38.8 36.2 31.6* 30.9*  MCV 81.3 81.9 81.7 81.7  PLT 273 251 216 469    Basic Metabolic Panel: Recent Labs  Lab 03/19/20 1212 03/20/20 0654 03/23/20 0646 03/24/20 0414  NA 136 135 138 136  K 4.0 3.7 3.8 3.0*  CL 94* 101 101 102  CO2 28 24 23 23   GLUCOSE 137* 117* 128* 100*  BUN 36* 30* 22 13  CREATININE 1.65* 1.17* 0.92 0.74  CALCIUM 9.4 8.3* 8.3* 8.0*  MG  --  1.9 1.7 1.6*  PHOS  --   --  2.5 1.8*    GFR: Estimated Creatinine Clearance: 53.4 mL/min (by C-G formula based on SCr of 0.74 mg/dL).  Liver Function Tests: Recent Labs  Lab 03/19/20 1212 03/20/20 6295  AST 25 19  ALT 18 14  ALKPHOS 78 65  BILITOT 0.5 0.8  PROT 7.9 6.4*  ALBUMIN 4.0 3.1*    CBG: Recent Labs  Lab 03/24/20 2020 03/25/20 0013 03/25/20 0426 03/25/20 0819 03/25/20 1149  GLUCAP 138* 112* 113* 120* 116*     Recent Results (from the past 240 hour(s))  SARS Coronavirus 2 by RT PCR (hospital order, performed in [facility] hospital lab) Nasopharyngeal Nasopharyngeal Swab     Status: None   Collection Time: 03/19/20 12:13 PM   Specimen: Nasopharyngeal Swab  Result Value Ref Range Status   SARS Coronavirus 2 NEGATIVE NEGATIVE Final    Comment: (NOTE) SARS-CoV-2 target nucleic acids are NOT DETECTED.  The SARS-CoV-2 RNA is generally detectable in upper and lower respiratory specimens during the acute phase of  infection. The lowest concentration of SARS-CoV-2 viral copies this assay can detect is 250 copies / mL. A negative result does not preclude SARS-CoV-2 infection and should not be used as the sole basis for treatment or other patient management decisions.  A negative result may occur with improper specimen collection / handling, submission of specimen other than nasopharyngeal swab, presence of viral mutation(s) within the areas targeted by this assay, and inadequate number of viral copies (<250 copies / mL). A negative result must be combined with clinical observations, patient history, and epidemiological information.  Fact Sheet for Patients:   StrictlyIdeas.no  Fact Sheet for Healthcare Providers: BankingDealers.co.za  This test is not yet approved or  cleared by the [place] FDA and has been authorized for detection and/or diagnosis of SARS-CoV-2 by FDA under an Emergency Use Authorization (EUA).  This EUA will remain in effect (meaning this test can be used) for the duration of the COVID-19 declaration under Section 564(b)(1) of the Act, 21 U.S.C. section 360bbb-3(b)(1), unless the authorization is terminated or revoked sooner.  Performed at [facility], [address], [place], [place] [address]      Radiology Studies: No results found. Scheduled Meds: . [START ON 03/26/2020] amLODipine  5 mg Oral Daily  . gabapentin  300 mg Oral TID  . heparin  5,000 Units Subcutaneous Q8H  . insulin aspart  0-5 Units Subcutaneous QHS  . insulin aspart  0-9 Units Subcutaneous Q4H  . LORazepam  0.5 mg Intravenous Once  . memantine  10 mg Oral BID  . mirtazapine  30 mg Oral QHS  . pantoprazole  40 mg Oral Q1200  . QUEtiapine  12.5 mg Oral QHS  . simvastatin  40 mg Oral Daily   Continuous Infusions:    LOS: 6 days    Time spent: 30 minutes    Barton Dubois, MD [facility]   To contact the attending provider between  7A-7P or the covering provider during after hours 7P-7A, please log into the web site [email or web address] and access using universal [place] password for that web site. If you do not have the password, please call the hospital operator.  03/25/2020, 2:12 PM

## 2020-03-25 NOTE — Discharge Instructions (Signed)
-  Keep Incision clean and dry. No need to keep it covered if staples are not catching on clothes.  Ok to shower but no baths or swimming until followup -No lifting >10lbs, no push/pull >30lbs until further notice

## 2020-03-26 LAB — GLUCOSE, CAPILLARY
Glucose-Capillary: 94 mg/dL (ref 70–99)
Glucose-Capillary: 109 mg/dL — ABNORMAL HIGH (ref 70–99)
Glucose-Capillary: 114 mg/dL — ABNORMAL HIGH (ref 70–99)
Glucose-Capillary: 102 mg/dL — ABNORMAL HIGH (ref 70–99)
Glucose-Capillary: 150 mg/dL — ABNORMAL HIGH (ref 70–99)

## 2020-03-26 NOTE — Progress Notes (Signed)
PROGRESS NOTE    Laurie Mcfarland  HMC:[id number] DOB: 04-18-1935 DOA: 03/19/2020 PCP: [name], MD (Confirm with patient/family/NH records and if not entered, this HAS to be entered at [facility] point of entry. "No PCP" if truly none.)   Chief Complaint  Patient presents with  . Emesis    Brief Narrative: As per H&P written by [name] on 03/19/2020 85 y.o. female with medical history significant for mild dementia, fibromyalgia, anxiety/depression, chronic pain, type 2 diabetes, dyslipidemia, hypertension, CKD stage IIIa, history of peptic and duodenal ulcers, and prior PE-currently on Eliquis, who to the ED with nausea and vomiting that began about 2 days ago.  She initially states that her emesis was dark in color, but has now turned brighter green.  She is noted to have abdominal distention and tenderness throughout and she has not been able to eat anything on account of her persistent nausea and vomiting.  She denies any bowel movements in the last 2 days.  She denies any fevers or chills.  She states that she continues to take Eliquis for blood clot previously, but has never been told to discontinue even though she has completed treatment over the course of 12 months.  She has had prior hysterectomy many years ago.   ED Course: Stable vital signs noted and patient is afebrile.  She had received some Zofran and morphine in the ED with some relief of her symptoms.  1 view chest x-ray with no acute findings.  CT of the abdomen pelvis with moderate to severe distal SBO noted.  EDP had discussed with Dr. Arnoldo Morale with general surgery who will assess in a.m.  Patient has not had any nausea or vomiting since arrival to the ED.  Creatinine is 1.65 with baseline 0.9-1.  EKG with mild sinus tachycardia noted.  Assessment & Plan: 1-distal SBO -Continue to follow as needed analgesics and supportive care. -Patient currently without active nausea or vomiting.  No fever.  Another bowel movement reported  overnight. -Postoperative day 3 after exploratory laparotomy and lysis of adhesions. -Continue to follow postoperative recommendations by general surgery. -Diet has been advanced to soft diet and so far well tolerated. Continue to follow clinical response and full return of her intestinal function.  2-acute kidney injury on chronic kidney disease a stage IIIa -Baseline creatinine 0.9-1 -Creatinine peaked at 1.65 on presentation -Continue to minimize/avoid nephrotoxic agents -No signs or concerns for UTI -Continue to follow renal function trend; renal function back to baseline currently.  3-type 2 diabetes with neuropathy -Continue sliding scale insulin -Continue the use of gabapentin. -Close monitoring of patient's CBGs, and perform further adjustment to hypoglycemic regimen as required.  4-history of hypertension -Appears to be stable and well-controlled -Resume the use of Norvasc -Follow vital signs and further adjust antihypertensive regimen as needed. -If continues stable and tolerating oral will resume Lasix and losartan.  5-history of chronic pain -Continue pain medications as needed as prescribed by general surgery currently.  Treatment of acute pain in her postoperative will also assist with her chronic back pain.  6-gastroesophageal reflux disease/peptic ulcer disease -Continue PPI  7-history of depression/anxiety -Stable mood appreciated -Will resume home antidepressant/anxiolytic regimen.  8-mild dementia/insomnia/hospital-acquired delirium -Resume the use of Namenda. -Continue constant reorientation -Continue low-dose Seroquel will be started.  9-hyperlipidemia -Continue statins.  10-physical deconditioning -Appreciate assistance and evaluation by physical therapy -Has been recommended skilled nursing facility for further care and conditioning -TOC CM/SW aware and helping with discharge plan. -Patient currently medically stable  for that.   DVT prophylaxis:  Heparin Code Status: Full code Family Communication: No family at bedside. Disposition:   Status is: Inpatient  Dispo: The patient is from: Home              Anticipated d/c is to: SNF              Anticipated d/c date is: To be determined.  Needs SNF.  Waiting on insurance authorization.              Patient currently is medically stable for discharge currently; will resume home oral hypoglycemic agents and further advance diet as per general surgery recommendations.  Currently no fever, no shortness of breath, no chest pain, no nausea, no vomiting.  Weak and deconditioned requiring rehabilitation at a facility for conditioning.  Appreciate physical therapy evaluation and assessment.  TOC aware and helping with placement.  Status post lysis of adhesion and exploratory laparotomy on 03/22/2020.     Consultants:   General surgery   Procedures:  See below for x-ray reports. Exploratory laparotomy/lysis of adhesions 03/22/2020.   Antimicrobials:  None   Subjective: No fever, no chest pain, no nausea, no vomiting.  Positive bowel movement.  Tolerating diet.  Expressed feeling weak and deconditioned.  Objective: Vitals:   03/25/20 2040 03/25/20 2106 03/26/20 0512 03/26/20 0843  BP: (!) 139/76  (!) 159/57 (!) 164/93  Pulse: 95  92   Resp: 16  16   Temp: 99 F (37.2 C)     TempSrc: Oral     SpO2: 96% 94% 97%   Weight:      Height:        Intake/Output Summary (Last 24 hours) at 03/26/2020 1316 Last data filed at 03/26/2020 1030 Gross per 24 hour  Intake 240 ml  Output --  Net 240 ml   Filed Weights   03/19/20 1122 03/19/20 1703 03/22/20 1002  Weight: 72.6 kg 83.5 kg 83.5 kg    Examination:  General exam: Alert, awake, oriented x 2; pleasant and in no acute distress.  Denies nausea and vomiting; patient reports mild intermittent abdominal discomfort.  Positive bowel movement.  Tolerating diet. Respiratory system: Clear to auscultation. Respiratory effort  normal. Cardiovascular system:RRR. No murmurs, rubs, gallops. Gastrointestinal system: Abdomen is nondistended, soft and nontender. No organomegaly or masses felt. Normal bowel sounds heard. Central nervous system: Alert and oriented. No focal neurological deficits. Extremities: No cyanosis or clubbing. Skin: No rashes, no petechiae; incision clean, dry and without signs of superimposed infection. Psychiatry: Mood & affect appropriate.    Data Reviewed: I have personally reviewed following labs and imaging studies  CBC: Recent Labs  Lab 03/20/20 0654 03/23/20 0646 03/24/20 0414  WBC 9.3 7.7 7.1  HGB 11.0* 9.9* 9.4*  HCT 36.2 31.6* 30.9*  MCV 81.9 81.7 81.7  PLT 251 216 960    Basic Metabolic Panel: Recent Labs  Lab 03/20/20 0654 03/23/20 0646 03/24/20 0414  NA 135 138 136  K 3.7 3.8 3.0*  CL 101 101 102  CO2 24 23 23   GLUCOSE 117* 128* 100*  BUN 30* 22 13  CREATININE 1.17* 0.92 0.74  CALCIUM 8.3* 8.3* 8.0*  MG 1.9 1.7 1.6*  PHOS  --  2.5 1.8*    GFR: Estimated Creatinine Clearance: 53.4 mL/min (by C-G formula based on SCr of 0.74 mg/dL).  Liver Function Tests: Recent Labs  Lab 03/20/20 0654  AST 19  ALT 14  ALKPHOS 65  BILITOT 0.8  PROT 6.4*  ALBUMIN 3.1*    CBG: Recent Labs  Lab 03/25/20 1959 03/25/20 2339 03/26/20 0515 03/26/20 0838 03/26/20 1141  GLUCAP 103* 103* 94 109* 114*     Recent Results (from the past 240 hour(s))  SARS Coronavirus 2 by RT PCR (hospital order, performed in [facility] hospital lab) Nasopharyngeal Nasopharyngeal Swab     Status: None   Collection Time: 03/19/20 12:13 PM   Specimen: Nasopharyngeal Swab  Result Value Ref Range Status   SARS Coronavirus 2 NEGATIVE NEGATIVE Final    Comment: (NOTE) SARS-CoV-2 target nucleic acids are NOT DETECTED.  The SARS-CoV-2 RNA is generally detectable in upper and lower respiratory specimens during the acute phase of infection. The lowest concentration of SARS-CoV-2 viral  copies this assay can detect is 250 copies / mL. A negative result does not preclude SARS-CoV-2 infection and should not be used as the sole basis for treatment or other patient management decisions.  A negative result may occur with improper specimen collection / handling, submission of specimen other than nasopharyngeal swab, presence of viral mutation(s) within the areas targeted by this assay, and inadequate number of viral copies (<250 copies / mL). A negative result must be combined with clinical observations, patient history, and epidemiological information.  Fact Sheet for Patients:   StrictlyIdeas.no  Fact Sheet for Healthcare Providers: BankingDealers.co.za  This test is not yet approved or  cleared by the [place] FDA and has been authorized for detection and/or diagnosis of SARS-CoV-2 by FDA under an Emergency Use Authorization (EUA).  This EUA will remain in effect (meaning this test can be used) for the duration of the COVID-19 declaration under Section 564(b)(1) of the Act, 21 U.S.C. section 360bbb-3(b)(1), unless the authorization is terminated or revoked sooner.  Performed at [facility], [address] [place], [place] [address]      Radiology Studies: No results found. Scheduled Meds: . amLODipine  5 mg Oral Daily  . atorvastatin  20 mg Oral Daily  . gabapentin  300 mg Oral TID  . heparin  5,000 Units Subcutaneous Q8H  . insulin aspart  0-5 Units Subcutaneous QHS  . insulin aspart  0-9 Units Subcutaneous Q4H  . LORazepam  0.5 mg Intravenous Once  . memantine  10 mg Oral BID  . mirtazapine  30 mg Oral QHS  . pantoprazole  40 mg Oral Q1200  . QUEtiapine  12.5 mg Oral QHS   Continuous Infusions:    LOS: 7 days    Time spent: 30 minutes    Barton Dubois, MD [facility]   To contact the attending provider between 7A-7P or the covering provider during after hours 7P-7A, please log into the  web site [email or web address] and access using universal [place] password for that web site. If you do not have the password, please call the hospital operator.  03/26/2020, 1:16 PM

## 2020-03-26 NOTE — Progress Notes (Signed)
Subjective:  CC: Laurie Mcfarland is a 85 y.o. female  Hospital stay day 7, 3 Days Post-Op Ex-lap, LOA  HPI: No issues overnight.  Tolerated full liquid.  ROS:  General: Denies weight loss, weight gain, fatigue, fevers, chills, and night sweats. Heart: Denies chest pain, palpitations, racing heart, irregular heartbeat, leg pain or swelling, and decreased activity tolerance. Respiratory: Denies breathing difficulty, shortness of breath, wheezing, cough, and sputum. GI: Denies change in appetite, heartburn, nausea, vomiting, constipation, diarrhea, and blood in stool. GU: Denies difficulty urinating, pain with urinating, urgency, frequency, blood in urine.   Objective:   Temp:  [98.2 F (36.8 C)-99 F (37.2 C)] 99 F (37.2 C) (07/24 2040) Pulse Rate:  [90-95] 92 (07/25 0512) Resp:  [16-18] 16 (07/25 0512) BP: (139-164)/(57-93) 164/93 (07/25 0843) SpO2:  [94 %-99 %] 97 % (07/25 0512)     Height: 5\' 2"  (157.5 cm) Weight: 83.5 kg BMI (Calculated): 33.66   Intake/Output this shift:   Intake/Output Summary (Last 24 hours) at 03/26/2020 1025 Last data filed at 03/25/2020 1811 Gross per 24 hour  Intake 240 ml  Output --  Net 240 ml    Constitutional :  alert, cooperative, appears stated age and no distress  Respiratory:  clear to auscultation bilaterally  Cardiovascular:  regular rate and rhythm  Gastrointestinal: soft, non-tender; bowel sounds normal; no masses,  no organomegaly.   Skin: Cool and moist. Staple incision C/D/I.  Psychiatric: Normal affect, non-agitated, not confused       LABS:  CMP Latest Ref Rng & Units 03/24/2020 03/23/2020 03/20/2020  Glucose 70 - 99 mg/dL 100(H) 128(H) 117(H)  BUN 8 - 23 mg/dL 13 22 30(H)  Creatinine 0.44 - 1.00 mg/dL 0.74 0.92 1.17(H)  Sodium 135 - 145 mmol/L 136 138 135  Potassium 3.5 - 5.1 mmol/L 3.0(L) 3.8 3.7  Chloride 98 - 111 mmol/L 102 101 101  CO2 22 - 32 mmol/L 23 23 24   Calcium 8.9 - 10.3 mg/dL 8.0(L) 8.3(L) 8.3(L)  Total Protein  6.5 - 8.1 g/dL - - 6.4(L)  Total Bilirubin 0.3 - 1.2 mg/dL - - 0.8  Alkaline Phos 38 - 126 U/L - - 65  AST 15 - 41 U/L - - 19  ALT 0 - 44 U/L - - 14   CBC Latest Ref Rng & Units 03/24/2020 03/23/2020 03/20/2020  WBC 4.0 - 10.5 K/uL 7.1 7.7 9.3  Hemoglobin 12.0 - 15.0 g/dL 9.4(L) 9.9(L) 11.0(L)  Hematocrit 36 - 46 % 30.9(L) 31.6(L) 36.2  Platelets 150 - 400 K/uL 226 216 251    RADS: n/a Assessment:   S/p ex-lap, LOA.  Tolerating diet advancement well.  Ok to advance to carb modified diet and d/c once arrangements made.  F/u one week with Dr. Arnoldo Morale for post op wound check, possible staple removal.

## 2020-03-27 LAB — GLUCOSE, CAPILLARY
Glucose-Capillary: 105 mg/dL — ABNORMAL HIGH (ref 70–99)
Glucose-Capillary: 107 mg/dL — ABNORMAL HIGH (ref 70–99)
Glucose-Capillary: 108 mg/dL — ABNORMAL HIGH (ref 70–99)
Glucose-Capillary: 112 mg/dL — ABNORMAL HIGH (ref 70–99)
Glucose-Capillary: 114 mg/dL — ABNORMAL HIGH (ref 70–99)
Glucose-Capillary: 131 mg/dL — ABNORMAL HIGH (ref 70–99)

## 2020-03-27 NOTE — TOC Progression Note (Signed)
Transition of Care [facility]) - Progression Note    Patient Details  Name: LACYE MCCARN MRN: [id number] Date of Birth: June 19, 1935  Transition [facility] CM/SW Contact  Boneta Lucks, RN Phone Number: 03/27/2020, 2:06 PM  Clinical Narrative:   Lynelle Smoke from [place] [facility] made a bed offer,  Insurance Auth still pending, faxing updated documents to [facility].     Expected Discharge Plan: Skilled Nursing Facility Barriers to Discharge: Insurance Authorization  Expected Discharge Plan and Services Expected Discharge Plan: [place] In-house Referral: Clinical Social Work     Living arrangements for the past 2 months: Single Family Home                    Readmission Risk Interventions Readmission Risk Prevention Plan 03/24/2020  Transportation Screening Complete  HRI or Home Care Consult Complete  Social Work Consult for [place] Planning/Counseling Complete  Palliative Care Screening Not Applicable  Medication Review [employer] photographer) Complete  Some recent data might be hidden

## 2020-03-27 NOTE — Progress Notes (Signed)
5 Days Post-Op  Subjective: Patient smiles and states she is having no abdominal pain.  Objective: Vital signs in last 24 hours: Temp:  [97.9 F (36.6 C)-99.4 F (37.4 C)] 97.9 F (36.6 C) (07/26 0537) Pulse Rate:  [50-110] 50 (07/26 0537) Resp:  [16] 16 (07/26 0537) BP: (142-168)/(74-84) 158/84 (07/26 0839) SpO2:  [93 %-97 %] 94 % (07/26 0537) Last BM Date: 03/26/20  Intake/Output from previous day: 07/25 0701 - 07/26 0700 In: 1010 [P.O.:1010] Out: -  Intake/Output this shift: Total I/O In: 240 [P.O.:240] Out: -   General appearance: alert, cooperative and no distress GI: Soft, incision healing well.  No rigidity noted.  Lab Results:  No results for input(s): WBC, HGB, HCT, PLT in the last 72 hours. BMET No results for input(s): NA, K, CL, CO2, GLUCOSE, BUN, CREATININE, CALCIUM in the last 72 hours. PT/INR No results for input(s): LABPROT, INR in the last 72 hours.  Studies/Results: No results found.  Anti-infectives: Anti-infectives (From admission, onward)   Start     Dose/Rate Route Frequency Ordered Stop   03/22/20 1015  metroNIDAZOLE (FLAGYL) IVPB 500 mg       "And" Linked Group Details   500 mg 100 mL/hr over 60 Minutes Intravenous On call to O.R. 03/22/20 1003 03/22/20 1125   03/22/20 1015  ciprofloxacin (CIPRO) IVPB 400 mg       "And" Linked Group Details   400 mg 200 mL/hr over 60 Minutes Intravenous On call to O.R. 03/22/20 1003 03/22/20 1124      Assessment/Plan: s/p Procedure(s): EXPLORATORY LAPAROTOMY LYSIS OF ADHESIONS Impression: Postoperative day 5.  Awaiting skilled nursing rehabilitation.  Staples would need to be removed 2 weeks after the surgery.  This can either be done in our office or at the skilled nursing unit, depending on where she goes.  LOS: 8 days    [name] 03/27/2020

## 2020-03-27 NOTE — Progress Notes (Signed)
PROGRESS NOTE    Laurie Mcfarland  MWN:[id number] DOB: Jul 08, 1935 DOA: 03/19/2020 PCP: [name], MD (Confirm with patient/family/NH records and if not entered, this HAS to be entered at [facility] point of entry. "No PCP" if truly none.)   Chief Complaint  Patient presents with  . Emesis    Brief Narrative: As per H&P written by [name] on 03/19/2020 85 y.o. female with medical history significant for mild dementia, fibromyalgia, anxiety/depression, chronic pain, type 2 diabetes, dyslipidemia, hypertension, CKD stage IIIa, history of peptic and duodenal ulcers, and prior PE-currently on Eliquis, who to the ED with nausea and vomiting that began about 2 days ago.  She initially states that her emesis was dark in color, but has now turned brighter green.  She is noted to have abdominal distention and tenderness throughout and she has not been able to eat anything on account of her persistent nausea and vomiting.  She denies any bowel movements in the last 2 days.  She denies any fevers or chills.  She states that she continues to take Eliquis for blood clot previously, but has never been told to discontinue even though she has completed treatment over the course of 12 months.  She has had prior hysterectomy many years ago.   ED Course: Stable vital signs noted and patient is afebrile.  She had received some Zofran and morphine in the ED with some relief of her symptoms.  1 view chest x-ray with no acute findings.  CT of the abdomen pelvis with moderate to severe distal SBO noted.  EDP had discussed with Dr. Arnoldo Morale with general surgery who will assess in a.m.  Patient has not had any nausea or vomiting since arrival to the ED.  Creatinine is 1.65 with baseline 0.9-1.  EKG with mild sinus tachycardia noted.  Assessment & Plan: 1-distal SBO -Continue to follow as needed analgesics and supportive care. -Patient currently without active nausea or vomiting.  No fever.  Another bowel movement reported  overnight. -Postoperative day 3 after exploratory laparotomy and lysis of adhesions. -Continue to follow postoperative recommendations by general surgery. -Diet has been advanced to soft diet and so far well tolerated. Continue to follow clinical response and full return of her intestinal function.  2-acute kidney injury on chronic kidney disease a stage IIIa -Baseline creatinine 0.9-1 -Creatinine peaked at 1.65 on presentation -Continue to minimize/avoid nephrotoxic agents -No signs or concerns for UTI -Continue to follow renal function trend; renal function back to baseline currently.  3-type 2 diabetes with neuropathy -Continue sliding scale insulin -Continue the use of gabapentin. -Close monitoring of patient's CBGs, and perform further adjustment to hypoglycemic regimen as required.  4-history of hypertension -Appears to be stable and well-controlled -Resume the use of Norvasc -Follow vital signs and further adjust antihypertensive regimen as needed. -If continues stable and tolerating oral will resume Lasix and losartan.  5-history of chronic pain -Continue pain medications as needed as prescribed by general surgery currently.  Treatment of acute pain in her postoperative will also assist with her chronic back pain.  6-gastroesophageal reflux disease/peptic ulcer disease -Continue PPI  7-history of depression/anxiety -Stable mood appreciated -Will resume home antidepressant/anxiolytic regimen.  8-mild dementia/insomnia/hospital-acquired delirium -Resume the use of Namenda. -Continue constant reorientation -Continue low-dose Seroquel will be started.  9-hyperlipidemia -Continue statins.  10-physical deconditioning -Appreciate assistance and evaluation by physical therapy -Has been recommended skilled nursing facility for further care and conditioning -TOC CM/SW aware and helping with discharge plan. -Patient currently medically stable  for that.   DVT prophylaxis:  Heparin Code Status: Full code Family Communication: No family at bedside. Disposition:   Status is: Inpatient  Dispo: The patient is from: Home              Anticipated d/c is to: SNF              Anticipated d/c date is: To be determined.  Needs SNF.  Waiting on insurance authorization.              Patient currently is medically stable for discharge currently; will resume home oral hypoglycemic agents and further advance diet as per general surgery recommendations.  Currently no fever, no shortness of breath, no chest pain, no nausea, no vomiting.  Weak and deconditioned requiring rehabilitation at a facility for conditioning.  Appreciate physical therapy evaluation and assessment.  TOC aware and helping with placement.  Status post lysis of adhesion and exploratory laparotomy on 03/22/2020.     Consultants:   General surgery   Procedures:  See below for x-ray reports. Exploratory laparotomy/lysis of adhesions 03/22/2020.   Antimicrobials:  None   Subjective: No fever, no chest pain, no nausea, no vomiting.  Patient reports having good tolerance of current diet, no abdominal pain and no acute distress.  Objective: Vitals:   03/26/20 2019 03/26/20 2102 03/27/20 0537 03/27/20 0839  BP:  (!) 142/82 (!) 161/74 (!) 158/84  Pulse:  (!) 110 50   Resp:  16 16   Temp:  99.4 F (37.4 C) 97.9 F (36.6 C)   TempSrc:  Oral Oral   SpO2: 93% 97% 94%   Weight:      Height:        Intake/Output Summary (Last 24 hours) at 03/27/2020 1230 Last data filed at 03/27/2020 0900 Gross per 24 hour  Intake 1130 ml  Output --  Net 1130 ml   Filed Weights   03/19/20 1122 03/19/20 1703 03/22/20 1002  Weight: 72.6 kg 83.5 kg 83.5 kg    Examination: General exam: Alert, awake, oriented x 2; pleasant and in no acute distress.  Reports no abdominal pain, no nausea, no vomiting, good tolerance of current diet and positive bowel movement.  Patient is afebrile Respiratory system: Clear to  auscultation. Respiratory effort normal. Cardiovascular system:RRR. No murmurs, rubs, gallops. Gastrointestinal system: Abdomen is nondistended, soft and nontender. No organomegaly or masses felt. Normal bowel sounds heard. Central nervous system: No focal neurological deficits. Extremities: No cyanosis or clubbing.  No edema. Skin: No rashes, no petechiae; abdominal incision is clean, dry and without signs of superimposed infection Psychiatry: Mood & affect appropriate.    Data Reviewed: I have personally reviewed following labs and imaging studies  CBC: Recent Labs  Lab 03/23/20 0646 03/24/20 0414  WBC 7.7 7.1  HGB 9.9* 9.4*  HCT 31.6* 30.9*  MCV 81.7 81.7  PLT 216 220    Basic Metabolic Panel: Recent Labs  Lab 03/23/20 0646 03/24/20 0414  NA 138 136  K 3.8 3.0*  CL 101 102  CO2 23 23  GLUCOSE 128* 100*  BUN 22 13  CREATININE 0.92 0.74  CALCIUM 8.3* 8.0*  MG 1.7 1.6*  PHOS 2.5 1.8*    GFR: Estimated Creatinine Clearance: 53.4 mL/min (by C-G formula based on SCr of 0.74 mg/dL).  CBG: Recent Labs  Lab 03/26/20 2020 03/27/20 0040 03/27/20 0608 03/27/20 0722 03/27/20 1120  GLUCAP 150* 131* 112* 107* 105*     Recent Results (from the past 240 hour(s))  SARS Coronavirus 2 by RT PCR (hospital order, performed in [facility] hospital lab) Nasopharyngeal Nasopharyngeal Swab     Status: None   Collection Time: 03/19/20 12:13 PM   Specimen: Nasopharyngeal Swab  Result Value Ref Range Status   SARS Coronavirus 2 NEGATIVE NEGATIVE Final    Comment: (NOTE) SARS-CoV-2 target nucleic acids are NOT DETECTED.  The SARS-CoV-2 RNA is generally detectable in upper and lower respiratory specimens during the acute phase of infection. The lowest concentration of SARS-CoV-2 viral copies this assay can detect is 250 copies / mL. A negative result does not preclude SARS-CoV-2 infection and should not be used as the sole basis for treatment or other patient management  decisions.  A negative result may occur with improper specimen collection / handling, submission of specimen other than nasopharyngeal swab, presence of viral mutation(s) within the areas targeted by this assay, and inadequate number of viral copies (<250 copies / mL). A negative result must be combined with clinical observations, patient history, and epidemiological information.  Fact Sheet for Patients:   StrictlyIdeas.no  Fact Sheet for Healthcare Providers: BankingDealers.co.za  This test is not yet approved or  cleared by the [place] FDA and has been authorized for detection and/or diagnosis of SARS-CoV-2 by FDA under an Emergency Use Authorization (EUA).  This EUA will remain in effect (meaning this test can be used) for the duration of the COVID-19 declaration under Section 564(b)(1) of the Act, 21 U.S.C. section 360bbb-3(b)(1), unless the authorization is terminated or revoked sooner.  Performed at [facility], [address], [place], [place] [address]      Radiology Studies: No results found. Scheduled Meds: . amLODipine  5 mg Oral Daily  . atorvastatin  20 mg Oral Daily  . gabapentin  300 mg Oral TID  . heparin  5,000 Units Subcutaneous Q8H  . insulin aspart  0-5 Units Subcutaneous QHS  . insulin aspart  0-9 Units Subcutaneous Q4H  . LORazepam  0.5 mg Intravenous Once  . memantine  10 mg Oral BID  . mirtazapine  30 mg Oral QHS  . pantoprazole  40 mg Oral Q1200  . QUEtiapine  12.5 mg Oral QHS   Continuous Infusions:    LOS: 8 days    Time spent: 30 minutes    Barton Dubois, MD [facility]   To contact the attending provider between 7A-7P or the covering provider during after hours 7P-7A, please log into the web site [email or web address] and access using universal [place] password for that web site. If you do not have the password, please call the hospital operator.  03/27/2020, 12:30 PM

## 2020-03-27 NOTE — Care Management Important Message (Signed)
Important Message  Patient Details  Name: Laurie Mcfarland MRN: [id number] Date of Birth: [birth year]   Medicare Important Message Given:  Yes     Tommy Medal 03/27/2020, 4:23 PM

## 2020-03-28 ENCOUNTER — Inpatient Hospital Stay
Admission: RE | Admit: 2020-03-28 | Discharge: 2020-04-17 | Disposition: A | Discharge status: Skilled Nursing Facility | Payer: Medicare Other | Source: Ambulatory Visit | Attending: Internal Medicine | Admitting: Internal Medicine

## 2020-03-28 DIAGNOSIS — N1831 Chronic kidney disease, stage 3a: Secondary | ICD-10-CM | POA: Diagnosis not present

## 2020-03-28 DIAGNOSIS — R52 Pain, unspecified: Secondary | ICD-10-CM | POA: Diagnosis not present

## 2020-03-28 DIAGNOSIS — Z741 Need for assistance with personal care: Secondary | ICD-10-CM | POA: Diagnosis not present

## 2020-03-28 DIAGNOSIS — R600 Localized edema: Secondary | ICD-10-CM | POA: Diagnosis not present

## 2020-03-28 DIAGNOSIS — I1310 Hypertensive heart and chronic kidney disease without heart failure, with stage 1 through stage 4 chronic kidney disease, or unspecified chronic kidney disease: Secondary | ICD-10-CM | POA: Diagnosis not present

## 2020-03-28 DIAGNOSIS — F03918 Unspecified dementia, unspecified severity, with other behavioral disturbance: Secondary | ICD-10-CM

## 2020-03-28 DIAGNOSIS — G8929 Other chronic pain: Secondary | ICD-10-CM | POA: Diagnosis not present

## 2020-03-28 DIAGNOSIS — E1169 Type 2 diabetes mellitus with other specified complication: Secondary | ICD-10-CM | POA: Diagnosis not present

## 2020-03-28 DIAGNOSIS — Z48815 Encounter for surgical aftercare following surgery on the digestive system: Secondary | ICD-10-CM | POA: Diagnosis not present

## 2020-03-28 DIAGNOSIS — I10 Essential (primary) hypertension: Secondary | ICD-10-CM | POA: Diagnosis not present

## 2020-03-28 DIAGNOSIS — N183 Chronic kidney disease, stage 3 unspecified: Secondary | ICD-10-CM | POA: Diagnosis not present

## 2020-03-28 DIAGNOSIS — D62 Acute posthemorrhagic anemia: Secondary | ICD-10-CM | POA: Diagnosis not present

## 2020-03-28 DIAGNOSIS — M545 Low back pain: Secondary | ICD-10-CM | POA: Diagnosis not present

## 2020-03-28 DIAGNOSIS — E1159 Type 2 diabetes mellitus with other circulatory complications: Secondary | ICD-10-CM | POA: Diagnosis not present

## 2020-03-28 DIAGNOSIS — E785 Hyperlipidemia, unspecified: Secondary | ICD-10-CM

## 2020-03-28 DIAGNOSIS — K56609 Unspecified intestinal obstruction, unspecified as to partial versus complete obstruction: Secondary | ICD-10-CM | POA: Diagnosis not present

## 2020-03-28 DIAGNOSIS — E1143 Type 2 diabetes mellitus with diabetic autonomic (poly)neuropathy: Secondary | ICD-10-CM | POA: Diagnosis not present

## 2020-03-28 DIAGNOSIS — E876 Hypokalemia: Secondary | ICD-10-CM | POA: Diagnosis not present

## 2020-03-28 DIAGNOSIS — F0391 Unspecified dementia with behavioral disturbance: Secondary | ICD-10-CM

## 2020-03-28 DIAGNOSIS — F0390 Unspecified dementia without behavioral disturbance: Secondary | ICD-10-CM | POA: Diagnosis not present

## 2020-03-28 DIAGNOSIS — F339 Major depressive disorder, recurrent, unspecified: Secondary | ICD-10-CM | POA: Diagnosis not present

## 2020-03-28 DIAGNOSIS — M797 Fibromyalgia: Secondary | ICD-10-CM | POA: Diagnosis not present

## 2020-03-28 DIAGNOSIS — M159 Polyosteoarthritis, unspecified: Secondary | ICD-10-CM | POA: Diagnosis not present

## 2020-03-28 DIAGNOSIS — R262 Difficulty in walking, not elsewhere classified: Secondary | ICD-10-CM | POA: Diagnosis not present

## 2020-03-28 DIAGNOSIS — E1122 Type 2 diabetes mellitus with diabetic chronic kidney disease: Secondary | ICD-10-CM | POA: Diagnosis not present

## 2020-03-28 DIAGNOSIS — K219 Gastro-esophageal reflux disease without esophagitis: Secondary | ICD-10-CM | POA: Diagnosis not present

## 2020-03-28 DIAGNOSIS — M6281 Muscle weakness (generalized): Secondary | ICD-10-CM | POA: Diagnosis not present

## 2020-03-28 DIAGNOSIS — Z8711 Personal history of peptic ulcer disease: Secondary | ICD-10-CM | POA: Diagnosis not present

## 2020-03-28 LAB — SARS CORONAVIRUS 2 BY RT PCR (HOSPITAL ORDER, PERFORMED IN ~~LOC~~ HOSPITAL LAB): SARS Coronavirus 2: NEGATIVE

## 2020-03-28 LAB — GLUCOSE, CAPILLARY
Glucose-Capillary: 101 mg/dL — ABNORMAL HIGH (ref 70–99)
Glucose-Capillary: 101 mg/dL — ABNORMAL HIGH (ref 70–99)
Glucose-Capillary: 108 mg/dL — ABNORMAL HIGH (ref 70–99)
Glucose-Capillary: 111 mg/dL — ABNORMAL HIGH (ref 70–99)
Glucose-Capillary: 130 mg/dL — ABNORMAL HIGH (ref 70–99)

## 2020-03-28 MED ORDER — ATORVASTATIN CALCIUM 20 MG PO TABS: 20.0000 mg | ORAL_TABLET | Freq: Every day | ORAL | Status: AC

## 2020-03-28 MED ORDER — APIXABAN 2.5 MG PO TABS: 2.5000 mg | ORAL_TABLET | Freq: Two times a day (BID) | ORAL | Status: DC

## 2020-03-28 MED ORDER — AMLODIPINE BESYLATE 5 MG PO TABS: 5.0000 mg | ORAL_TABLET | Freq: Every day | ORAL | Status: DC

## 2020-03-28 MED ORDER — GABAPENTIN 300 MG PO CAPS: 300.0000 mg | ORAL_CAPSULE | Freq: Three times a day (TID) | ORAL | Status: AC

## 2020-03-28 MED ORDER — OXYCODONE HCL 10 MG PO TABS: 5.0000 mg | ORAL_TABLET | Freq: Four times a day (QID) | ORAL | 0 refills | Status: DC | PRN

## 2020-03-28 MED ORDER — LOSARTAN POTASSIUM 25 MG PO TABS: 12.5000 mg | ORAL_TABLET | Freq: Every day | ORAL | Status: DC

## 2020-03-28 MED ORDER — POTASSIUM CHLORIDE CRYS ER 20 MEQ PO TBCR: 20.0000 meq | EXTENDED_RELEASE_TABLET | Freq: Every morning | ORAL | Status: DC

## 2020-03-28 MED ORDER — ALPRAZOLAM 0.5 MG PO TABS: 0.5000 mg | ORAL_TABLET | Freq: Three times a day (TID) | ORAL | 0 refills | Status: DC | PRN

## 2020-03-28 NOTE — Discharge Summary (Signed)
Physician Discharge Summary  Laurie Mcfarland IEP:[id number] DOB: [birth year] DOA: 03/19/2020  PCP: [name], MD  Admit date: 03/19/2020 Discharge date: 03/28/2020  Time spent: 35 minutes  Recommendations for Outpatient Follow-up:  1. Reassess blood pressure with further adjustment to antihypertensive regimen as needed 2. Close monitoring of CBGs/A1c with adjustment to hypoglycemic regimen as required. 3. Repeat basic metabolic panel to follow across her renal function in 1 week. 4. Repeat CBC to follow hemoglobin and WBCs trend.   Discharge Diagnoses:  Small bowel obstruction ([place]) Dementia with behavioral disturbance ([place]) Hypertension Hyperlipidemia Acute kidney injury and chronic kidney disease a stage IIIa Type 2 diabetes with nephropathy GERD Physical deconditioning  Discharge Condition: Stable and improved.  Discharged home with instruction to follow-up with PCP after discharge from the skilled nursing facility.  1 week follow-up with general surgery for staple removal.  CODE STATUS: Full code.  Diet recommendation: Heart healthy/modified carbohydrate diet.  Filed Weights   03/19/20 1122 03/19/20 1703 03/22/20 1002  Weight: 72.6 kg 83.5 kg 83.5 kg    History of present illness:  As per H&P written by Laurie Mcfarland on 03/19/2020 85 y.o.femalewith medical history significant formild dementia, fibromyalgia, anxiety/depression, chronic pain, type 2 diabetes, dyslipidemia, hypertension, CKD stage IIIa, history of peptic and duodenal ulcers, and prior PE-currently on Eliquis, who to the ED with nausea and vomiting that began about 2 days ago. She initially states that her emesis was dark in color, but has now turned brighter green. She is noted to have abdominal distention and tenderness throughout and she has not been able to eat anything on account of her persistent nausea and vomiting. She denies any bowel movements in the last 2 days. She denies any fevers or chills. She  states that she continues to take Eliquis for blood clot previously, but has never been told to discontinue even though she has completed treatment over the course of 12 months. She has had prior hysterectomy many years ago.  ED Course:Stable vital signs noted and patient is afebrile. She had received some Zofran and morphine in the ED with some relief of her symptoms. 1 view chest x-ray with no acute findings. CT of the abdomen pelvis with moderate to severe distal SBO noted. EDP had discussed with Dr. Arnoldo Morale with general surgery who will assess in a.m. Patient has not had any nausea or vomiting since arrival to the ED. Creatinine is 1.65 with baseline 0.9-1. EKG with mild sinus tachycardia noted.  Hospital Course:  1-distal SBO -Continue to follow as needed analgesics and supportive care. -Patient currently without active nausea or vomiting.  No fever having BMs. -Postoperative day 6 after exploratory laparotomy and lysis of adhesions. -Continue to follow postoperative recommendations by general surgery. -Diet has been advanced and well tolerated.  -Patient will be discharged to skilled nursing facility for further care and rehabilitation -Follow-up in 1 week with general surgery for staple removals.  2-acute kidney injury on chronic kidney disease a stage IIIa -Baseline creatinine 0.9-1 -Creatinine peaked at 1.65 on presentation -Renal function back to baseline at this time. -Continue to minimize nephrotoxic agents and avoid hypotension. -Advised to maintain adequate hydration. -No signs or concerns for UTI.  3-type 2 diabetes with neuropathy -Continue modified carbohydrate diet -Continue the use of gabapentin 3 times daily for neuropathy. -Resume home oral hypoglycemic agents. -Follow CBGs/A1c and further adjust hypoglycemic medication as required.  4-history of hypertension -Appears to be stable and well-controlled -Resume adjusted dose of home antihypertensive  regimen -Follow  vital signs and further adjust medication as needed -Heart healthy diet has been recommended.  5-history of chronic pain -Continue pain medications as needed as prescribed by general surgery currently.  -Supportive therapy and deconditioning also exacerbating chronic back pain; patient will be transferred to a skilled nursing facility for further care.  6-gastroesophageal reflux disease/peptic ulcer disease -Continue PPI.  7-history of depression/anxiety -Stable mood appreciated at this time. -Will resume home antidepressant/anxiolytic regimen. -No suicidal ideation or hallucinations.  8-mild dementia/insomnia/hospital-acquired delirium -Resume the use of Namenda. -Continue constant reorientation  9-hyperlipidemia -Continue statins.  10-physical deconditioning -Appreciate assistance and evaluation by physical therapy -Has been recommended skilled nursing facility for further care and conditioning -Patient and family in agreement with these recommendations; will transfer to the [facility] for further rehabilitation and care.  11-prior history of pulmonary embolism -Patient completed over 12 months of treatment -At this time no need for continue anticoagulation therapy.   Procedures: See below for x-ray reports. Exploratory laparotomy/lysis of adhesions 03/22/2020.  Consultations:  General surgery  Discharge Exam: Vitals:   03/27/20 2044 03/28/20 0458  BP: (!) 144/76 (!) 149/74  Pulse: 104 99  Resp: 16 16  Temp: 98.5 F (36.9 C) 98.4 F (36.9 C)  SpO2: 96% 96%   General exam: Alert, awake, oriented x 2; pleasant and in no acute distress.  Reports no abdominal pain, no nausea, no vomiting, good tolerance of current diet and positive bowel movement.  Patient is afebrile Respiratory system: Clear to auscultation. Respiratory effort normal. Cardiovascular system:RRR. No murmurs, rubs, gallops. Gastrointestinal system: Abdomen is nondistended,  soft and nontender. No organomegaly or masses felt. Normal bowel sounds heard. Central nervous system: No focal neurological deficits. Extremities: No cyanosis or clubbing.  No edema. Skin: No rashes, no petechiae; abdominal incision is clean, dry and without signs of superimposed infection, staples in place. Psychiatry: Mood & affect appropriate.     Discharge Instructions   Discharge Instructions    Diet - low sodium heart healthy   Complete by: As directed    Discharge instructions   Complete by: As directed    Take medications as prescribed Maintain adequate hydration Follow-up with Dr. Arnoldo Morale (general surgery) in 1 week. Heart healthy diet recommended.   Discharge wound care:   Complete by: As directed    Okay for patient to take shower; local care recommended to prevent friction with clothes.  Remove staples in 1 week.   Increase activity slowly   Complete by: As directed      Allergies as of 03/28/2020      Reactions   Penicillins Rash   Has patient had a PCN reaction causing immediate rash, facial/tongue/throat swelling, SOB or lightheadedness with hypotension: No Has patient had a PCN reaction causing severe rash involving mucus membranes or skin necrosis: No Has patient had a PCN reaction that required hospitalization: No Has patient had a PCN reaction occurring within the last 10 years: No If all of the above answers are "NO", then may proceed with Cephalosporin use.      Medication List    STOP taking these medications   Belsomra 10 MG Tabs Generic drug: Suvorexant   doxycycline 50 MG capsule Commonly known as: VIBRAMYCIN   Eliquis 5 MG Tabs tablet Generic drug: apixaban   gabapentin 600 MG tablet Commonly known as: NEURONTIN Replaced by: gabapentin 300 MG capsule   metroNIDAZOLE 0.75 % cream Commonly known as: METROCREAM   simvastatin 40 MG tablet Commonly known as: ZOCOR   traMADol 50  MG tablet Commonly known as: ULTRAM     TAKE these  medications   acetaminophen 325 MG tablet Commonly known as: TYLENOL Take 2 tablets (650 mg total) by mouth every 6 (six) hours as needed for mild pain or headache (or Fever >/= 101).   albuterol 108 (90 Base) MCG/ACT inhaler Commonly known as: VENTOLIN HFA Inhale 1-2 puffs into the lungs every 4 (four) hours as needed.   ALPRAZolam 0.5 MG tablet Commonly known as: XANAX Take 1 tablet (0.5 mg total) by mouth 3 (three) times daily as needed.   amLODipine 5 MG tablet Commonly known as: NORVASC Take 1 tablet (5 mg total) by mouth daily. Start taking on: March 29, 2020 What changed:   medication strength  how much to take   atorvastatin 20 MG tablet Commonly known as: LIPITOR Take 1 tablet (20 mg total) by mouth daily. Start taking on: March 29, 2020   busPIRone 10 MG tablet Commonly known as: BUSPAR Take 10 mg by mouth 2 (two) times daily.   furosemide 20 MG tablet Commonly known as: LASIX Take 20 mg by mouth daily as needed.   gabapentin 300 MG capsule Commonly known as: NEURONTIN Take 1 capsule (300 mg total) by mouth 3 (three) times daily. Replaces: gabapentin 600 MG tablet   losartan 25 MG tablet Commonly known as: COZAAR Take 0.5 tablets (12.5 mg total) by mouth daily. What changed: how much to take   memantine 10 MG tablet Commonly known as: NAMENDA Take 10 mg by mouth 2 (two) times daily.   metFORMIN 500 MG tablet Commonly known as: GLUCOPHAGE Take 500 mg by mouth 2 (two) times daily with a meal.   mirtazapine 30 MG tablet Commonly known as: REMERON Take 30 mg by mouth at bedtime.   multivitamin with minerals Tabs tablet Take 1 tablet by mouth daily.   omeprazole 40 MG capsule Commonly known as: PRILOSEC Take 1 capsule (40 mg total) by mouth 2 (two) times daily.   Oxycodone HCl 10 MG Tabs Take 0.5 tablets (5 mg total) by mouth every 6 (six) hours as needed (severe pain). What changed:   how much to take  when to take this  reasons to take  this   potassium chloride SA 20 MEQ tablet Commonly known as: KLOR-CON Take 1 tablet (20 mEq total) by mouth every morning. If lasix needed. What changed: additional instructions   primidone 50 MG tablet Commonly known as: MYSOLINE Take 25 mg by mouth at bedtime.   Vitamin D-3 25 MCG (1000 UT) Caps Take 1 capsule by mouth 2 (two) times daily.   zolpidem 5 MG tablet Commonly known as: AMBIEN Take 5 mg by mouth at bedtime.            Discharge Care Instructions  (From admission, onward)         Start     Ordered   03/28/20 0000  Discharge wound care:       Comments: Okay for patient to take shower; local care recommended to prevent friction with clothes.  Remove staples in 1 week.   03/28/20 1254         Allergies  Allergen Reactions   Penicillins Rash    Has patient had a PCN reaction causing immediate rash, facial/tongue/throat swelling, SOB or lightheadedness with hypotension: No Has patient had a PCN reaction causing severe rash involving mucus membranes or skin necrosis: No Has patient had a PCN reaction that required hospitalization: No Has patient had a PCN  reaction occurring within the last 10 years: No If all of the above answers are "NO", then may proceed with Cephalosporin use.     Follow-up Information    [name], MD Follow up.   Specialty: General Surgery Why: As needed.  Would remove staples around 04/05/20.  This can be done at nursing home. Contact information: [address] [place] [address] [phone]               The results of significant diagnostics from this hospitalization (including imaging, microbiology, ancillary and laboratory) are listed below for reference.    Significant Diagnostic Studies: CT ABDOMEN PELVIS WO CONTRAST  Result Date: 03/19/2020 CLINICAL DATA:  Acute lower abdominal pain. EXAM: CT ABDOMEN AND PELVIS WITHOUT CONTRAST TECHNIQUE: Multidetector CT imaging of the abdomen and pelvis was  performed following the standard protocol without IV contrast. COMPARISON:  None. FINDINGS: Lower chest: No acute abnormality. Hepatobiliary: No focal liver abnormality is seen. No gallstones, gallbladder wall thickening, or biliary dilatation. Pancreas: Unremarkable. No pancreatic ductal dilatation or surrounding inflammatory changes. Spleen: Normal in size without focal abnormality. Adrenals/Urinary Tract: Adrenal glands are unremarkable. Kidneys are normal, without renal calculi, focal lesion, or hydronephrosis. Bladder is unremarkable. Stomach/Bowel: Mild gastric distention is noted. Moderate to severe small bowel dilatation is noted. Definite transition zone is not identified, although most likely is in the pelvis as there is nondilated ileum in this area. The appendix is unremarkable. No colonic dilatation is noted. Mild amount of stool is seen throughout the colon. Vascular/Lymphatic: Aortic atherosclerosis. No enlarged abdominal or pelvic lymph nodes. Reproductive: Status post hysterectomy. No adnexal masses. Other: No abdominal wall hernia or abnormality. No abdominopelvic ascites. Musculoskeletal: No acute or significant osseous findings. IMPRESSION: 1. Moderate to severe small bowel dilatation is noted concerning for distal small bowel obstruction. Definite transition zone is not identified, although most likely is in the pelvis as there is nondilated ileum in this area. Aortic Atherosclerosis (ICD10-I70.0). Electronically Signed   By: Marijo Conception M.D.   On: 03/19/2020 14:18   DG Chest Portable 1 View  Result Date: 03/19/2020 CLINICAL DATA:  Epigastric and chest pain. EXAM: PORTABLE CHEST 1 VIEW COMPARISON:  February 16, 2018 FINDINGS: Cardiomediastinal silhouette is normal. Mediastinal contours appear intact. Chronic elevation of the right hemidiaphragm. There is no evidence of focal airspace consolidation, pleural effusion or pneumothorax. Osseous structures are without acute abnormality. Soft  tissues are grossly normal. IMPRESSION: No active disease. Chronic elevation of right hemidiaphragm. Electronically Signed   By: Fidela Salisbury M.D.   On: 03/19/2020 13:10   DG Abd Portable 1V-Small Bowel Obstruction Protocol-initial, 8 hr delay  Result Date: 03/21/2020 CLINICAL DATA:  Small-bowel obstruction EXAM: PORTABLE ABDOMEN - 1 VIEW COMPARISON:  03/19/2020 FINDINGS: Two supine frontal views of the abdomen and pelvis are obtained 8 hours after the administration of oral contrast. Contrast remains within the stomach and proximal duodenum. There is diffuse gaseous distension of the distal small bowel measuring up to 4.2 cm in diameter. Minimal gas and stool within the colon. IMPRESSION: 1. Persistent small bowel dilatation, with no significant transit of oral contrast compatible with small-bowel obstruction. Electronically Signed   By: Randa Ngo M.D.   On: 03/21/2020 21:11    Microbiology: Recent Results (from the past 240 hour(s))  SARS Coronavirus 2 by RT PCR (hospital order, performed in [facility] hospital lab) Nasopharyngeal Nasopharyngeal Swab     Status: None   Collection Time: 03/19/20 12:13 PM   Specimen: Nasopharyngeal Swab  Result Value Ref Range Status   SARS Coronavirus 2 NEGATIVE NEGATIVE Final    Comment: (NOTE) SARS-CoV-2 target nucleic acids are NOT DETECTED.  The SARS-CoV-2 RNA is generally detectable in upper and lower respiratory specimens during the acute phase of infection. The lowest concentration of SARS-CoV-2 viral copies this assay can detect is 250 copies / mL. A negative result does not preclude SARS-CoV-2 infection and should not be used as the sole basis for treatment or other patient management decisions.  A negative result may occur with improper specimen collection / handling, submission of specimen other than nasopharyngeal swab, presence of viral mutation(s) within the areas targeted by this assay, and inadequate number of viral  copies (<250 copies / mL). A negative result must be combined with clinical observations, patient history, and epidemiological information.  Fact Sheet for Patients:   StrictlyIdeas.no  Fact Sheet for Healthcare Providers: BankingDealers.co.za  This test is not yet approved or  cleared by the [place] FDA and has been authorized for detection and/or diagnosis of SARS-CoV-2 by FDA under an Emergency Use Authorization (EUA).  This EUA will remain in effect (meaning this test can be used) for the duration of the COVID-19 declaration under Section 564(b)(1) of the Act, 21 U.S.C. section 360bbb-3(b)(1), unless the authorization is terminated or revoked sooner.  Performed at [facility], [address] [place], [place] [address]      Labs: Basic Metabolic Panel: Recent Labs  Lab 03/23/20 0646 03/24/20 0414  NA 138 136  K 3.8 3.0*  CL 101 102  CO2 23 23  GLUCOSE 128* 100*  BUN 22 13  CREATININE 0.92 0.74  CALCIUM 8.3* 8.0*  MG 1.7 1.6*  PHOS 2.5 1.8*   CBC: Recent Labs  Lab 03/23/20 0646 03/24/20 0414  WBC 7.7 7.1  HGB 9.9* 9.4*  HCT 31.6* 30.9*  MCV 81.7 81.7  PLT 216 226    ProBNP (last 3 results) No results for input(s): PROBNP in the last 8760 hours.  CBG: Recent Labs  Lab 03/27/20 2045 03/28/20 0000 03/28/20 0344 03/28/20 0737 03/28/20 1153  GLUCAP 108* 108* 101* 111* 130*    Signed:  Barton Dubois MD.  [facility] 03/28/2020, 12:55 PM

## 2020-03-28 NOTE — Progress Notes (Signed)
Called patient's legal guardian, Karoline Caldwell, at [place] to inform her that patient will be d/cd today to [facility]

## 2020-03-28 NOTE — Progress Notes (Signed)
Called report to [place] at [facility]

## 2020-03-28 NOTE — Progress Notes (Signed)
NT from [facility] came to get patient to take to [facility].

## 2020-03-28 NOTE — Plan of Care (Signed)

## 2020-03-28 NOTE — TOC Transition Note (Signed)
Transition of Care [facility]) - CM/SW Discharge Note   Patient Details  Name: Laurie Mcfarland MRN: [id number] Date of Birth: [birth year]  Transition of Care [facility]) CM/SW Contact:  Boneta Lucks, RN Phone Number: 03/28/2020, 1:36 PM   Clinical Narrative:   Insurance authorization received, Tammy at [facility] is ready for patient , she will need a COVID test. Clinical sent in the hub. RN and MD updated. RN to call report.     Final next level of care: Skilled Nursing Facility Barriers to Discharge: Barriers Resolved   Patient Goals and CMS Choice Patient states their goals for this hospitalization and ongoing recovery are:: to go to SNF. CMS Medicare.gov Compare Post Acute Care list provided to:: Patient Represenative (must comment) Choice offered to / list presented to : Adult Children, [place] / [place]  Discharge Placement              Patient chooses bed at: [facility] Patient to be transferred to facility by: [facility] staff Name of family member notified: [facility] - they took a message for Lyn[place] Patient and family notified of of transfer: 03/28/20  Discharge Plan and Services In-house Referral: Clinical Social Work        Readmission Risk Interventions Readmission Risk Prevention Plan 03/24/2020  Transportation Screening Complete  HRI or Home Care Consult Complete  Social Work Consult for [name] Complete  Palliative Care Screening Not Applicable  Medication Review [employer] photographer) Complete  Some recent data might be hidden

## 2020-03-28 NOTE — Progress Notes (Signed)
6 Days Post-Op  Subjective: Resting comfortably.  Objective: Vital signs in last 24 hours: Temp:  [98.2 F (36.8 C)-98.5 F (36.9 C)] 98.4 F (36.9 C) (07/27 0458) Pulse Rate:  [99-108] 99 (07/27 0458) Resp:  [16-18] 16 (07/27 0458) BP: (129-158)/(74-84) 149/74 (07/27 0458) SpO2:  [94 %-96 %] 96 % (07/27 0458) Last BM Date: 03/27/20  Intake/Output from previous day: 07/26 0701 - 07/27 0700 In: 840 [P.O.:840] Out: 950 [Urine:950] Intake/Output this shift: No intake/output data recorded.  General appearance: alert, cooperative and no distress GI: Soft, incision healing well.  Lab Results:  No results for input(s): WBC, HGB, HCT, PLT in the last 72 hours. BMET No results for input(s): NA, K, CL, CO2, GLUCOSE, BUN, CREATININE, CALCIUM in the last 72 hours. PT/INR No results for input(s): LABPROT, INR in the last 72 hours.  Studies/Results: No results found.  Anti-infectives: Anti-infectives (From admission, onward)   Start     Dose/Rate Route Frequency Ordered Stop   03/22/20 1015  metroNIDAZOLE (FLAGYL) IVPB 500 mg       "And" Linked Group Details   500 mg 100 mL/hr over 60 Minutes Intravenous On call to O.R. 03/22/20 1003 03/22/20 1125   03/22/20 1015  ciprofloxacin (CIPRO) IVPB 400 mg       "And" Linked Group Details   400 mg 200 mL/hr over 60 Minutes Intravenous On call to O.R. 03/22/20 1003 03/22/20 1124      Assessment/Plan: s/p Procedure(s): EXPLORATORY LAPAROTOMY LYSIS OF ADHESIONS Impression: Stable on postoperative day 6.  Awaiting skilled nursing placement.  LOS: 9 days    [name] 03/28/2020

## 2020-03-28 NOTE — Progress Notes (Signed)
Physical Therapy Treatment Patient Details Name: Laurie Mcfarland MRN: [id number] DOB: Jan 27, 1935 Today's Date: 03/28/2020    History of Present Illness Laurie Mcfarland is a 85 y.o. female with medical history significant for mild dementia, fibromyalgia, anxiety/depression, chronic pain, type 2 diabetes, dyslipidemia, hypertension, CKD stage IIIa, history of peptic and duodenal ulcers, and prior PE-currently on Eliquis, who to the ED with nausea and vomiting that began about 2 days ago.  She initially states that her emesis was dark in color, but has now turned brighter green.  She is noted to have abdominal distention and tenderness throughout and she has not been able to eat anything on account of her persistent nausea and vomiting.  She denies any bowel movements in the last 2 days.  She denies any fevers or chills.  She states that she continues to take Eliquis for blood clot previously, but has never been told to discontinue even though she has completed treatment over the course of 12 months.  She has had prior hysterectomy many years ago.    PT Comments    Patient resting in recliner with BLE elevated and agreeable to therapy. Pt able to perform therapeutic exercises with intermittent cues for form. After last exercise, pt complains of R calf "sore" and requests pillow under legs. Pt reports soreness in R calf improves with pillow; RN notified. Transfers and amb not attempted due to soreness complaints and fatigue with therapeutic exercises. Patient will benefit from continued physical therapy in hospital and recommendations below to increase strength, balance, endurance for safe ADLs and gait.   Follow Up Recommendations  SNF     Equipment Recommendations  None recommended by PT    Recommendations for Other Services       Precautions / Restrictions Precautions Precautions: Fall Precaution Comments: Exploratory laparotomy, lysis of adhesions on 03/22/20 Restrictions Weight Bearing  Restrictions: No    Mobility  Bed Mobility  General bed mobility comments: in chair upon arrival  Transfers  General transfer comment: deferred due to R calf "sore" with seated exercise, RN notified  Ambulation/Gait  General Gait Details: deferred due to R calf "sore" with seated exercise, RN notified   Stairs             Wheelchair Mobility    Modified Rankin (Stroke Patients Only)       Balance               Cognition Arousal/Alertness: Awake/alert Behavior During Therapy: WFL for tasks assessed/performed Overall Cognitive Status: Within Functional Limits for tasks assessed   General Comments: A&O x4, friendly and agreeable to therapy      Exercises General Exercises - Lower Extremity Ankle Circles/Pumps: Strengthening;Seated;Both;10 reps Quad Sets: Strengthening;Seated;Both;10 reps Short Arc Quad: Strengthening;Seated;Both;10 reps Heel Slides: Strengthening;Seated;Both;10 reps    General Comments        Pertinent Vitals/Pain Pain Assessment: No/denies pain    Home Living             Prior Function            PT Goals (current goals can now be found in the care plan section) Acute Rehab PT Goals Patient Stated Goal: return home PT Goal Formulation: With patient Time For Goal Achievement: 04/07/20 Potential to Achieve Goals: Fair Progress towards PT goals: Progressing toward goals    Frequency    Min 3X/week      PT Plan      Co-evaluation  AM-PAC PT "6 Clicks" Mobility   Outcome Measure  Help needed turning from your back to your side while in a flat bed without using bedrails?: None Help needed moving from lying on your back to sitting on the side of a flat bed without using bedrails?: A Little Help needed moving to and from a bed to a chair (including a wheelchair)?: A Lot Help needed standing up from a chair using your arms (e.g., wheelchair or bedside chair)?: A Little Help needed to walk in hospital  room?: A Lot Help needed climbing 3-5 steps with a railing? : Total 6 Click Score: 15    End of Session   Activity Tolerance: Patient tolerated treatment well Patient left: with call bell/phone within reach;in chair Nurse Communication: Mobility status;Other (comment) (R calf "sore" with exercise and improved with pillow under legs) PT Visit Diagnosis: Unsteadiness on feet (R26.81);Other abnormalities of gait and mobility (R26.89);Muscle weakness (generalized) (M62.81)     Time: 4562-5638 PT Time Calculation (min) (ACUTE ONLY): 10 min  Charges:  $Therapeutic Exercise: 8-22 mins                      Talbot Grumbling PT, DPT 03/28/20, 11:51 AM [phone]

## 2020-03-29 ENCOUNTER — Encounter: Payer: Self-pay | Admitting: Adult Health

## 2020-03-29 ENCOUNTER — Non-Acute Institutional Stay (SKILLED_NURSING_FACILITY): Payer: Medicare Other | Admitting: Adult Health

## 2020-03-29 DIAGNOSIS — I700 Atherosclerosis of aorta: Secondary | ICD-10-CM

## 2020-03-29 DIAGNOSIS — E1169 Type 2 diabetes mellitus with other specified complication: Secondary | ICD-10-CM

## 2020-03-29 DIAGNOSIS — M545 Low back pain, unspecified: Secondary | ICD-10-CM

## 2020-03-29 DIAGNOSIS — K219 Gastro-esophageal reflux disease without esophagitis: Secondary | ICD-10-CM

## 2020-03-29 DIAGNOSIS — I152 Hypertension secondary to endocrine disorders: Secondary | ICD-10-CM

## 2020-03-29 DIAGNOSIS — E1159 Type 2 diabetes mellitus with other circulatory complications: Secondary | ICD-10-CM

## 2020-03-29 DIAGNOSIS — D62 Acute posthemorrhagic anemia: Secondary | ICD-10-CM

## 2020-03-29 DIAGNOSIS — F0151 Vascular dementia with behavioral disturbance: Secondary | ICD-10-CM

## 2020-03-29 DIAGNOSIS — E785 Hyperlipidemia, unspecified: Secondary | ICD-10-CM | POA: Insufficient documentation

## 2020-03-29 DIAGNOSIS — K56609 Unspecified intestinal obstruction, unspecified as to partial versus complete obstruction: Secondary | ICD-10-CM | POA: Diagnosis not present

## 2020-03-29 DIAGNOSIS — N183 Chronic kidney disease, stage 3 unspecified: Secondary | ICD-10-CM | POA: Insufficient documentation

## 2020-03-29 DIAGNOSIS — E1122 Type 2 diabetes mellitus with diabetic chronic kidney disease: Secondary | ICD-10-CM

## 2020-03-29 DIAGNOSIS — E1143 Type 2 diabetes mellitus with diabetic autonomic (poly)neuropathy: Secondary | ICD-10-CM | POA: Diagnosis not present

## 2020-03-29 DIAGNOSIS — M797 Fibromyalgia: Secondary | ICD-10-CM

## 2020-03-29 DIAGNOSIS — G250 Essential tremor: Secondary | ICD-10-CM

## 2020-03-29 DIAGNOSIS — F01518 Vascular dementia, unspecified severity, with other behavioral disturbance: Secondary | ICD-10-CM

## 2020-03-29 DIAGNOSIS — G8929 Other chronic pain: Secondary | ICD-10-CM

## 2020-03-29 DIAGNOSIS — F339 Major depressive disorder, recurrent, unspecified: Secondary | ICD-10-CM | POA: Insufficient documentation

## 2020-03-29 DIAGNOSIS — I10 Essential (primary) hypertension: Secondary | ICD-10-CM

## 2020-03-29 DIAGNOSIS — E1142 Type 2 diabetes mellitus with diabetic polyneuropathy: Secondary | ICD-10-CM

## 2020-03-29 NOTE — Progress Notes (Signed)
Location:    [facility] Room Number: 128/P Place of Service:  SNF (31)   CODE STATUS: Full Code  Allergies  Allergen Reactions  . Penicillins Rash    Has patient had a PCN reaction causing immediate rash, facial/tongue/throat swelling, SOB or lightheadedness with hypotension: No Has patient had a PCN reaction causing severe rash involving mucus membranes or skin necrosis: No Has patient had a PCN reaction that required hospitalization: No Has patient had a PCN reaction occurring within the last 10 years: No If all of the above answers are "NO", then may proceed with Cephalosporin use.     Chief Complaint  Patient presents with  . Hospitalization Follow-up    Hospitalization Follow Up    HPI:  She presented to the ED after 2 days of nausea and vomiting. She had abdominal pain. She was found to have distal small bowel obstruction. She had an exploratory lap with lysis of adhesions on 03-22-20. She has a medical history of chronic pain; fibromyalgia; dementia; pulmonary embolism. She is here for short tem rehab with her goal to return back home. She lives with family; on one floor; no steps. She does not have walker or wheelchair. She does have chronic back pain; denies any nausea or vomiting. Does have abdominal pain. She will continue to be followed for her chronic illnesses including: diabetes; hypertension; hyperlipidemia.    Past Medical History:  Diagnosis Date  . Anxiety   . Arthritis   . Cataracts, bilateral   . Depression   . Diabetes mellitus   . Diabetic peripheral neuropathy ([place])   . Fibromyalgia   . GERD (gastroesophageal reflux disease)   . Heart burn   . High cholesterol   . Hyperlipidemia   . Hypertension   . Leg swelling   . Shortness of breath    with exertion  . Weight loss   . Wheezing     Past Surgical History:  Procedure Laterality Date  . ABDOMINAL HYSTERECTOMY    . CATARACT EXTRACTION W/PHACO  12/10/2011   Procedure:  CATARACT EXTRACTION PHACO AND INTRAOCULAR LENS PLACEMENT (IOC);  Surgeon: Elta Guadeloupe T. Gershon Crane, MD;  Location: [facility];  Service: Ophthalmology;  Laterality: Left;  CDE=10.72  . CATARACT EXTRACTION W/PHACO  01/14/2012   Procedure: CATARACT EXTRACTION PHACO AND INTRAOCULAR LENS PLACEMENT (IOC);  Surgeon: Elta Guadeloupe T. Gershon Crane, MD;  Location: [facility];  Service: Ophthalmology;  Laterality: Right;  CDE:11.63  . ESOPHAGOGASTRODUODENOSCOPY N/A 07/18/2019   Procedure: ESOPHAGOGASTRODUODENOSCOPY (EGD);  Surgeon: Rogene Houston, MD; multiple gastric and duodenal ulcers, mild Schatzki ring in the distal esophagus, LA grade B esophagitis, irregular Z-line.  Repeat endoscopy in 3 months.  . ESOPHAGOGASTRODUODENOSCOPY (EGD) WITH ESOPHAGEAL DILATION N/A 06/17/2013   YPP:JKDTOI esophagus-status post Venia Minks dilation. Hiatal hernia. Gastric erosions s/p bx  . ESOPHAGOGASTRODUODENOSCOPY (EGD) WITH PROPOFOL N/A 11/25/2019   Procedure: ESOPHAGOGASTRODUODENOSCOPY (EGD) WITH PROPOFOL;  Surgeon: Daneil Dolin, MD; Normal esophagus, erosive gastropathy with some crpe appearance much better than previous EGD, superficial erosions/ulcerations small and scattered in the posterior duodenal bulb and second portion of the duodenum.  Gastric biop.-chronic gastritis w/o H. pylori.  Duodenal biop.-peptic duodenitis/ulceration  . foreign body removal     splinter removed from right side of face  . LAPAROTOMY N/A 03/22/2020   Procedure: EXPLORATORY LAPAROTOMY;  Surgeon: [name], MD;  Location: [facility];  Service: General;  Laterality: N/A;  . LYSIS OF ADHESION N/A 03/22/2020   Procedure: LYSIS OF ADHESIONS;  Surgeon: [name], MD;  Location: [facility];  Service: General;  Laterality: N/A;    Social History   Socioeconomic History  . Marital status: Widowed    Spouse name: Not on file  . Number of children: 4  . Years of education: Not on file  . Highest education level: Not on file  Occupational History  . Not on file  Tobacco  Use  . Smoking status: Former Smoker    Quit date: 08/30/1994    Years since quitting: 25.5  . Smokeless tobacco: Never Used  Vaping Use  . Vaping Use: Never used  Substance and Sexual Activity  . Alcohol use: No    Alcohol/week: 0.0 standard drinks  . Drug use: No  . Sexual activity: Not Currently  Other Topics Concern  . Not on file  Social History Narrative   Adopted granddaughter and raised great grandson.   Pt states that her grandson lives with her sometimes,    Social Determinants of Health   Financial Resource Strain: Low Risk   . Difficulty of Paying Living Expenses: Not hard at all  Food Insecurity: No Food Insecurity  . Worried About Charity fundraiser in the Last Year: Never true  . Ran Out of Food in the Last Year: Never true  Transportation Needs: No Transportation Needs  . Lack of Transportation (Medical): No  . Lack of Transportation (Non-Medical): No  Physical Activity: Inactive  . Days of Exercise per Week: 0 days  . Minutes of Exercise per Session: 0 min  Stress: Stress Concern Present  . Feeling of Stress : To some extent  Social Connections: Moderately Integrated  . Frequency of Communication with Friends and Family: More than three times a week  . Frequency of Social Gatherings with Friends and Family: More than three times a week  . Attends Religious Services: More than 4 times per year  . Active Member of Clubs or Organizations: Yes  . Attends Archivist Meetings: More than 4 times per year  . Marital Status: Widowed  Intimate Partner Violence: Not At Risk  . Fear of Current or Ex-Partner: No  . Emotionally Abused: No  . Physically Abused: No  . Sexually Abused: No   Family History  Problem Relation Age of Onset  . Alzheimer's disease Mother   . Cancer Father   . Diabetes Daughter   . High blood pressure Daughter   . High blood pressure Daughter   . Cancer Sister   . Cancer Brother   . Pseudochol deficiency Neg Hx   .  Malignant hyperthermia Neg Hx   . Hypotension Neg Hx   . Anesthesia problems Neg Hx   . Colon cancer Neg Hx   . Stomach cancer Neg Hx       VITAL SIGNS BP 127/73   Pulse 82   Temp 98.4 F (36.9 C) (Oral)   Resp 20   Ht 5\' 2"  (1.575 m)   Wt 162 lb 6.4 oz (73.7 kg)   SpO2 94%   BMI 29.70 kg/m   Outpatient Encounter Medications as of 03/29/2020  Medication Sig  . acetaminophen (TYLENOL) 325 MG tablet Take 2 tablets (650 mg total) by mouth every 6 (six) hours as needed for mild pain or headache (or Fever >/= 101).  Marland Kitchen albuterol (VENTOLIN HFA) 108 (90 Base) MCG/ACT inhaler Inhale 2 puffs into the lungs every 4 (four) hours as needed.   . ALPRAZolam (XANAX) 0.5 MG tablet Take 1 tablet (0.5 mg total) by mouth 3 (three)  times daily as needed.  Marland Kitchen amLODipine (NORVASC) 5 MG tablet Take 1 tablet (5 mg total) by mouth daily.  Marland Kitchen atorvastatin (LIPITOR) 20 MG tablet Take 1 tablet (20 mg total) by mouth daily.  . busPIRone (BUSPAR) 10 MG tablet Take 10 mg by mouth 2 (two) times daily.  . Cholecalciferol (VITAMIN D-3) 1000 UNITS CAPS Take 1 capsule by mouth 2 (two) times daily.   Marland Kitchen gabapentin (NEURONTIN) 300 MG capsule Take 1 capsule (300 mg total) by mouth 3 (three) times daily.  Marland Kitchen losartan (COZAAR) 25 MG tablet Take 0.5 tablets (12.5 mg total) by mouth daily.  . memantine (NAMENDA) 10 MG tablet Take 10 mg by mouth 2 (two) times daily.  . metFORMIN (GLUCOPHAGE) 500 MG tablet Take 500 mg by mouth 2 (two) times daily with a meal.   . mirtazapine (REMERON) 30 MG tablet Take 30 mg by mouth at bedtime.   . Multiple Vitamin (MULTIVITAMIN WITH MINERALS) TABS tablet Take 1 tablet by mouth daily.  . NON FORMULARY Diet: _____ Regular, ______ NAS, ___x____Consistent Carbohydrate, _______NPO _____Other  . omeprazole (PRILOSEC) 40 MG capsule Take 1 capsule (40 mg total) by mouth 2 (two) times daily.  . primidone (MYSOLINE) 50 MG tablet Take 25 mg by mouth at bedtime.  Marland Kitchen zolpidem (AMBIEN) 5 MG tablet Take  5 mg by mouth at bedtime.   No facility-administered encounter medications on file as of 03/29/2020.     SIGNIFICANT DIAGNOSTIC EXAMS  TODAY  03-19-20: chest x-ray:  No active disease. Chronic elevation of right hemidiaphragm.  03-19-20: ct of abdomen and pelvis:  1. Moderate to severe small bowel dilatation is noted concerning for distal small bowel obstruction. Definite transition zone is not identified, although most likely is in the pelvis as there is nondilated ileum in this area. Aortic Atherosclerosis   03-21-20: KUB 1. Persistent small bowel dilatation, with no significant transit of oral contrast compatible with small-bowel obstruction.   LABS REVIEWED:   03-19-20: wbc 11.8; hgb 12.0; hct 38.8; mcv 81.3 plt 273; glucose 137; bun 36; creat 1.65; k+ 4.0; na++ 136; ca 9.4 liver normal albumin 4.0; hgb a1c 6.2 03-23-20: wbc 7.7; hgb 9.9; hct 31.6; mcv 81.7 plt 216; glucose 128; bun 22; creat 0.92; k+ 3.8; na++ 138 ca 8,3 mag 1.7 phos 2.5    Review of Systems  Constitutional: Negative for malaise/fatigue.  Respiratory: Negative for cough and shortness of breath.   Cardiovascular: Negative for chest pain, palpitations and leg swelling.  Gastrointestinal: Positive for abdominal pain. Negative for constipation and heartburn.  Musculoskeletal: Positive for back pain. Negative for joint pain and myalgias.  Skin: Negative.   Neurological: Negative for dizziness.  Psychiatric/Behavioral: The patient is not nervous/anxious.      Physical Exam Constitutional:      General: She is not in acute distress.    Appearance: She is well-developed. She is not diaphoretic.  Neck:     Thyroid: No thyromegaly.  Cardiovascular:     Rate and Rhythm: Normal rate and regular rhythm.     Pulses: Normal pulses.     Heart sounds: Normal heart sounds.  Pulmonary:     Effort: Pulmonary effort is normal. No respiratory distress.     Breath sounds: Normal breath sounds.  Abdominal:     General:  Bowel sounds are normal. There is no distension.     Palpations: Abdomen is soft.     Tenderness: There is no abdominal tenderness.  Musculoskeletal:  General: Normal range of motion.     Cervical back: Neck supple.     Right lower leg: Edema present.     Left lower leg: Edema present.     Comments: Trace bilateral lower extremity edema   Lymphadenopathy:     Cervical: No cervical adenopathy.  Skin:    General: Skin is warm and dry.     Comments: Incision line without signs of infection present   Neurological:     Mental Status: She is alert. Mental status is at baseline.  Psychiatric:        Mood and Affect: Mood normal.        ASSESSMENT/ PLAN:  TODAY  1. Small bowel obstruction: is status post exp lap with lysis of adhesions; will monitor   2. Acute blood loss anemia: is stable hgb 9.9 will monitor  3. Fibromyalgia: is stable will monitor   4. Type 2 diabetes mellitus with diabetic autonomic neuropathy without long term current use of insulin: is stable hgb a1c 6.2 will stop metformin; is on arb and statin  5.  Hyperlipidemia associated with type 2 diabetes mellitus: is stable will continue lipitor 20 mg daily   6. Hypertension associated with type 2 diabetes mellitus: is stable b/p 127/73 will continue cozaar 12.5 mg daily norvasc 5 mg daily  7. gastroesophageal reflux disease without esophagitis: is stable will continue protonix 40 mg twice daily   8. vascular dementia with behavioral disturbance; is stable weight is 162 pounds; will continue namenda 10 mg twice daily   9. CKD stage 3 due to diabetes mellitus: is stable bun 22 creat 0.92 will monitor   10.  Essential tremor: is stable will continue primidone 25 mg daily  11. Major depression recurrent chronic: is stable will continue buspar 10 mg twice daily remeron 30 mg nightly ambien 5 mg nightly for sleep has xanax 0.5 mg three times daily prn through 04-10-20  12. Aortic atherosclerosis: is stable will  monitor  13. Diabetic peripheral neuropathy: is stable will continue gabapentin 300 mg three times daily   14. Chronic bilateral lower back pain without sciatica: is without change will begin lidoderm 4% patch daily; will continue oxycodone 5 mg twice daily as needed through 04-05-20.   Will check hgb hct and bmp    MD is aware of resident's narcotic use and is in agreement with current plan of care. We will attempt to wean resident as appropriate.  Ok Edwards NP [facility]  Contact [phone] Monday through Friday 8am- 5pm  After hours call [phone]

## 2020-03-30 ENCOUNTER — Encounter: Payer: Self-pay | Admitting: Internal Medicine

## 2020-03-30 ENCOUNTER — Non-Acute Institutional Stay (SKILLED_NURSING_FACILITY): Payer: Medicare Other | Admitting: Internal Medicine

## 2020-03-30 DIAGNOSIS — K56609 Unspecified intestinal obstruction, unspecified as to partial versus complete obstruction: Secondary | ICD-10-CM

## 2020-03-30 DIAGNOSIS — E1122 Type 2 diabetes mellitus with diabetic chronic kidney disease: Secondary | ICD-10-CM

## 2020-03-30 DIAGNOSIS — F0390 Unspecified dementia without behavioral disturbance: Secondary | ICD-10-CM | POA: Diagnosis not present

## 2020-03-30 DIAGNOSIS — N183 Chronic kidney disease, stage 3 unspecified: Secondary | ICD-10-CM | POA: Diagnosis not present

## 2020-03-30 DIAGNOSIS — F03A0 Unspecified dementia, mild, without behavioral disturbance, psychotic disturbance, mood disturbance, and anxiety: Secondary | ICD-10-CM

## 2020-03-30 NOTE — Assessment & Plan Note (Addendum)
General Surgery follow-up 1 week post discharge. She presently denies any active GI symptomatology.

## 2020-03-30 NOTE — Assessment & Plan Note (Addendum)
Monitor BMET as clinically indicated.

## 2020-03-30 NOTE — Assessment & Plan Note (Signed)
03/30/2020 she was unable to give me the date "not even the year.  She name the president is Trump but then corrected herself to state "no, the other guy".

## 2020-03-30 NOTE — Patient Instructions (Signed)
See assessment and plan under each diagnosis in the problem list and acutely for this visit 

## 2020-03-30 NOTE — Progress Notes (Signed)
NURSING HOME LOCATION: [facility] NUMBER:  128/P  CODE STATUS: Full Code PCP: [name], MD   This is a comprehensive admission note to [facility] performed on this date less than 30 days from date of admission. Included are preadmission medical/surgical history; reconciled medication list; family history; social history and comprehensive review of systems.  Corrections and additions to the records were documented. Comprehensive physical exam was also performed. Additionally a clinical summary was entered for each active diagnosis pertinent to this admission in the Problem List to enhance continuity of care.  HPI: Patient was hospitalized 7/18-7/27/2021 presenting with nausea and vomiting with an onset approximately 48 hours PTA.  Initially emesis was dark in color but had turned a bright green by history. The symptoms were associated with abdominal distention and tenderness diffusely and inability to eat due to persistent nausea and vomiting.  She also denied any bowel movements over the previous 48 hours. She was continuing to take Eliquis for history of DVT even though she had completed a 12 month course for the acute issue. She received Zofran and morphine in the ED with some relief of symptoms and resolution of the nausea and vomiting.  CT of the abdomen/pelvis revealed moderate-severe distal SBO.  Creatinine was 1.65; baseline was felt to be 0.9-1.  With gentle hydration, renal function returned to baseline. On 7/21 exploratory lap and lysis of adhesions was completed by Dr. [name], [place]. Diet advancement was well-tolerated.  PT/OT recommended discharge to SNF for rehab.  Past medical and surgical history: Includes essential hypertension, dyslipidemia, GERD, history of peptic & duodenal ulcer, fibromyalgia,anxiety, history of mild dementia, chronic back pain, CKD stage IIIa,history of PTE, and diabetes complicated by peripheral neuropathy. Surgical  procedures include TAH & EGD with esophageal dilation.  Social history: Nondrinker; former smoker quitting in 1995.  Family history: Extensive history reviewed; noncontributory due to advanced age.   Review of systems:  She is unable to give me the date, even the year.  She gave Trump as president's name, but then corrected herself stating "no, the other guy".  She went on to state that they are all "full of bull" and questioned " what is the world coming to?".  She could not tell me what procedure had been performed or why. She simply described it as "stomach operation, done whatever they done".  She does state that she is now tired from "exercise" indicating PT/OT.  She continues to have the chronic back pain.  She does describe some soreness over the abdomen "at the clamps".  Constitutional: No fever, significant weight change  Eyes: No redness, discharge, pain, vision change ENT/mouth: No nasal congestion, purulent discharge, earache, change in hearing, sore throat  Cardiovascular: No chest pain, palpitations, paroxysmal nocturnal dyspnea, claudication, edema  Respiratory: No cough, sputum production, hemoptysis, DOE, significant snoring, apnea  Gastrointestinal: No heartburn, dysphagia, nausea /vomiting, rectal bleeding, melena, change in bowels Genitourinary: No dysuria, hematuria, pyuria, incontinence, nocturia Dermatologic: No rash, pruritus, change in appearance of skin Neurologic: No dizziness, headache, syncope, seizures, numbness, tingling Psychiatric: No significant anxiety, depression, insomnia, anorexia Endocrine: No change in hair/skin/nails, excessive thirst, excessive hunger, excessive urination  Hematologic/lymphatic: No significant bruising, lymphadenopathy, abnormal bleeding Allergy/immunology: No itchy/watery eyes, significant sneezing, urticaria, angioedema  Physical exam:  Pertinent or positive findings: Facies are somewhat blank.  She was communicative but obviously  has some dementia.  Upper plate is present.  First heart sound is accentuated.  There is slight tachycardia.  Bowel sounds are decreased.  She is slightly tender to palpation around the op site.  Pedal pulses are surprisingly good.  She is strong to opposition.  She has isolated DIP arthritic changes.  General appearance: Adequately nourished; no acute distress, increased work of breathing is present.   Lymphatic: No lymphadenopathy about the head, neck, axilla. Eyes: No conjunctival inflammation or lid edema is present. There is no scleral icterus. Ears:  External ear exam shows no significant lesions or deformities.   Nose:  External nasal examination shows no deformity or inflammation. Nasal mucosa are pink and moist without lesions, exudates Oral exam: Lips and gums are healthy appearing.There is no oropharyngeal erythema or exudate. Neck:  No thyromegaly, masses, tenderness noted.    Heart:  No gallop, murmur, click, rub.  Lungs: Chest clear to auscultation without wheezes, rhonchi, rales, rubs. Abdomen: Bowel sounds are normal.  Abdomen is soft and with no organomegaly, hernias, masses. GU: Deferred  Extremities:  No cyanosis, clubbing, edema. Neurologic exam:  Strength equal  in upper & lower extremities. Balance, Rhomberg, finger to nose testing could not be completed due to clinical state Deep tendon reflexes are equal Skin: Warm & dry w/o tenting. No significant lesions or rash.  See clinical summary under each active problem in the Problem List with associated updated therapeutic plan

## 2020-04-03 ENCOUNTER — Other Ambulatory Visit: Payer: Self-pay | Admitting: Adult Health

## 2020-04-03 ENCOUNTER — Other Ambulatory Visit (HOSPITAL_COMMUNITY)
Admission: RE | Admit: 2020-04-03 | Discharge: 2020-04-03 | Disposition: A | Discharge status: Home / Self Care | Payer: Medicare Other | Source: Ambulatory Visit | Attending: Adult Health | Admitting: Adult Health

## 2020-04-03 DIAGNOSIS — I1310 Hypertensive heart and chronic kidney disease without heart failure, with stage 1 through stage 4 chronic kidney disease, or unspecified chronic kidney disease: Secondary | ICD-10-CM | POA: Insufficient documentation

## 2020-04-03 LAB — BASIC METABOLIC PANEL
Anion gap: 12 (ref 5–15)
BUN: 15 mg/dL (ref 8–23)
CO2: 25 mmol/L (ref 22–32)
Calcium: 9 mg/dL (ref 8.9–10.3)
Chloride: 103 mmol/L (ref 98–111)
Creatinine, Ser: 0.9 mg/dL (ref 0.44–1.00)
GFR calc Af Amer: >60 mL/min (ref >60)
GFR calc non Af Amer: 59 mL/min — ABNORMAL LOW (ref >60)
Glucose, Bld: 119 mg/dL — ABNORMAL HIGH (ref 70–99)
Potassium: 2.8 mmol/L — ABNORMAL LOW (ref 3.5–5.1)
Sodium: 140 mmol/L (ref 135–145)

## 2020-04-03 LAB — HEMOGLOBIN AND HEMATOCRIT, BLOOD
HCT: 35.5 % — ABNORMAL LOW (ref 36.0–46.0)
Hemoglobin: 11 g/dL — ABNORMAL LOW (ref 12.0–15.0)

## 2020-04-03 MED ORDER — ZOLPIDEM TARTRATE 5 MG PO TABS: 5.0000 mg | ORAL_TABLET | Freq: Every evening | ORAL | 0 refills | Status: DC | PRN

## 2020-04-06 ENCOUNTER — Other Ambulatory Visit: Payer: Self-pay | Admitting: Adult Health

## 2020-04-06 ENCOUNTER — Encounter: Payer: Self-pay | Admitting: Adult Health

## 2020-04-06 ENCOUNTER — Non-Acute Institutional Stay (SKILLED_NURSING_FACILITY): Payer: Medicare Other | Admitting: Adult Health

## 2020-04-06 DIAGNOSIS — E1159 Type 2 diabetes mellitus with other circulatory complications: Secondary | ICD-10-CM | POA: Diagnosis not present

## 2020-04-06 DIAGNOSIS — M797 Fibromyalgia: Secondary | ICD-10-CM | POA: Diagnosis not present

## 2020-04-06 DIAGNOSIS — M545 Low back pain, unspecified: Secondary | ICD-10-CM

## 2020-04-06 DIAGNOSIS — G8929 Other chronic pain: Secondary | ICD-10-CM

## 2020-04-06 DIAGNOSIS — K56609 Unspecified intestinal obstruction, unspecified as to partial versus complete obstruction: Secondary | ICD-10-CM | POA: Diagnosis not present

## 2020-04-06 DIAGNOSIS — R600 Localized edema: Secondary | ICD-10-CM | POA: Diagnosis not present

## 2020-04-06 DIAGNOSIS — I10 Essential (primary) hypertension: Secondary | ICD-10-CM

## 2020-04-06 DIAGNOSIS — I152 Hypertension secondary to endocrine disorders: Secondary | ICD-10-CM

## 2020-04-06 MED ORDER — OXYCODONE HCL 5 MG PO TABS: 5.0000 mg | ORAL_TABLET | Freq: Two times a day (BID) | ORAL | 0 refills | Status: DC

## 2020-04-06 NOTE — Progress Notes (Signed)
Location:    [place] Room Number: 128/P Place of Service:  SNF (31)   CODE STATUS: Full Code  Allergies  Allergen Reactions  . Penicillins Rash    Has patient had a PCN reaction causing immediate rash, facial/tongue/throat swelling, SOB or lightheadedness with hypotension: No Has patient had a PCN reaction causing severe rash involving mucus membranes or skin necrosis: No Has patient had a PCN reaction that required hospitalization: No Has patient had a PCN reaction occurring within the last 10 years: No If all of the above answers are "NO", then may proceed with Cephalosporin use.     Chief Complaint  Patient presents with  . Acute Visit    Care Plan Meeting    HPI:  We have come together for her care plan meeting. Family and guardian present. BIMS 13/15 mood 0/30. She is wanting to go back home; however; she is going to need 24 hour day support which her family is unable to provide for her. He family is going to look at assisted living facility. She does have chronic back pain for which she has been on chronic oxycodone for her pain. She does have bilateral lower extremity edema which she states is somewhat dependent. It will get better with elevating her legs; but does not resolve. Her surgical staples have been removed. She continues to work with therapy; she is ambulating 100 feet in therapy. Her requires minimal assistance with her adls. She has a wheelchair and walker at home. She continues to be followed for her chronic illnesses including: Small bowel obstruction  Fibromyalgia  Hypertension associated with type 2 diabetes mellitus  Bilateral lower extremity edema  Chronic bilateral lower back pain without sciatica   Past Medical History:  Diagnosis Date  . Anxiety   . Arthritis   . Cataracts, bilateral   . Depression   . Diabetes mellitus   . Diabetic peripheral neuropathy ([place])   . Fibromyalgia   . GERD (gastroesophageal reflux disease)   .  Hyperlipidemia   . Hypertension   . Leg swelling   . Shortness of breath    with exertion  . Weight loss   . Wheezing     Past Surgical History:  Procedure Laterality Date  . ABDOMINAL HYSTERECTOMY    . CATARACT EXTRACTION W/PHACO  12/10/2011   Procedure: CATARACT EXTRACTION PHACO AND INTRAOCULAR LENS PLACEMENT (IOC);  Surgeon: Elta Guadeloupe T. Gershon Crane, MD;  Location: [facility];  Service: Ophthalmology;  Laterality: Left;  CDE=10.72  . CATARACT EXTRACTION W/PHACO  01/14/2012   Procedure: CATARACT EXTRACTION PHACO AND INTRAOCULAR LENS PLACEMENT (IOC);  Surgeon: Elta Guadeloupe T. Gershon Crane, MD;  Location: [facility];  Service: Ophthalmology;  Laterality: Right;  CDE:11.63  . ESOPHAGOGASTRODUODENOSCOPY N/A 07/18/2019   Procedure: ESOPHAGOGASTRODUODENOSCOPY (EGD);  Surgeon: Rogene Houston, MD; multiple gastric and duodenal ulcers, mild Schatzki ring in the distal esophagus, LA grade B esophagitis, irregular Z-line.  Repeat endoscopy in 3 months.  . ESOPHAGOGASTRODUODENOSCOPY (EGD) WITH ESOPHAGEAL DILATION N/A 06/17/2013   [id number] esophagus-status post Venia Minks dilation. Hiatal hernia. Gastric erosions s/p bx  . ESOPHAGOGASTRODUODENOSCOPY (EGD) WITH PROPOFOL N/A 11/25/2019   Procedure: ESOPHAGOGASTRODUODENOSCOPY (EGD) WITH PROPOFOL;  Surgeon: Daneil Dolin, MD; Normal esophagus, erosive gastropathy with some crpe appearance much better than previous EGD, superficial erosions/ulcerations small and scattered in the posterior duodenal bulb and second portion of the duodenum.  Gastric biop.-chronic gastritis w/o H. pylori.  Duodenal biop.-peptic duodenitis/ulceration  . foreign body removal     splinter removed from  right side of face  . LAPAROTOMY N/A 03/22/2020   Procedure: EXPLORATORY LAPAROTOMY;  Surgeon: [name], MD;  Location: [facility];  Service: General;  Laterality: N/A;  . LYSIS OF ADHESION N/A 03/22/2020   Procedure: LYSIS OF ADHESIONS;  Surgeon: [name], MD;  Location: [facility];  Service: General;   Laterality: N/A;    Social History   Socioeconomic History  . Marital status: Widowed    Spouse name: Not on file  . Number of children: 4  . Years of education: Not on file  . Highest education level: Not on file  Occupational History  . Not on file  Tobacco Use  . Smoking status: Former Smoker    Quit date: 08/30/1994    Years since quitting: 25.6  . Smokeless tobacco: Never Used  Vaping Use  . Vaping Use: Never used  Substance and Sexual Activity  . Alcohol use: No    Alcohol/week: 0.0 standard drinks  . Drug use: No  . Sexual activity: Not Currently  Other Topics Concern  . Not on file  Social History Narrative   Adopted granddaughter and raised great grandson.   Pt states that her grandson lives with her sometimes,    Social Determinants of Health   Financial Resource Strain: Low Risk   . Difficulty of Paying Living Expenses: Not hard at all  Food Insecurity: No Food Insecurity  . Worried About Charity fundraiser in the Last Year: Never true  . Ran Out of Food in the Last Year: Never true  Transportation Needs: No Transportation Needs  . Lack of Transportation (Medical): No  . Lack of Transportation (Non-Medical): No  Physical Activity: Inactive  . Days of Exercise per Week: 0 days  . Minutes of Exercise per Session: 0 min  Stress: Stress Concern Present  . Feeling of Stress : To some extent  Social Connections: Moderately Integrated  . Frequency of Communication with Friends and Family: More than three times a week  . Frequency of Social Gatherings with Friends and Family: More than three times a week  . Attends Religious Services: More than 4 times per year  . Active Member of Clubs or Organizations: Yes  . Attends Archivist Meetings: More than 4 times per year  . Marital Status: Widowed  Intimate Partner Violence: Not At Risk  . Fear of Current or Ex-Partner: No  . Emotionally Abused: No  . Physically Abused: No  . Sexually Abused: No    Family History  Problem Relation Age of Onset  . Alzheimer's disease Mother   . Cancer Father   . Diabetes Daughter   . High blood pressure Daughter   . High blood pressure Daughter   . Cancer Sister   . Cancer Brother   . Pseudochol deficiency Neg Hx   . Malignant hyperthermia Neg Hx   . Hypotension Neg Hx   . Anesthesia problems Neg Hx   . Colon cancer Neg Hx   . Stomach cancer Neg Hx       VITAL SIGNS BP (!) 151/94   Pulse 93   Temp (!) 96.7 F (35.9 C) (Oral)   Resp 20   Ht 5\' 2"  (1.575 m)   Wt 169 lb 9.6 oz (76.9 kg)   SpO2 97%   BMI 31.02 kg/m   Outpatient Encounter Medications as of 04/06/2020  Medication Sig  . acetaminophen (TYLENOL) 325 MG tablet Take 2 tablets (650 mg total) by mouth every 6 (six) hours as  needed for mild pain or headache (or Fever >/= 101).  Marland Kitchen albuterol (VENTOLIN HFA) 108 (90 Base) MCG/ACT inhaler Inhale 2 puffs into the lungs every 4 (four) hours as needed.   . ALPRAZolam (XANAX) 0.5 MG tablet Take 1 tablet (0.5 mg total) by mouth 3 (three) times daily as needed.  Marland Kitchen atorvastatin (LIPITOR) 20 MG tablet Take 1 tablet (20 mg total) by mouth daily.  . busPIRone (BUSPAR) 10 MG tablet Take 10 mg by mouth 2 (two) times daily.  . Cholecalciferol (VITAMIN D-3) 1000 UNITS CAPS Take 1 capsule by mouth 2 (two) times daily.   Marland Kitchen gabapentin (NEURONTIN) 300 MG capsule Take 1 capsule (300 mg total) by mouth 3 (three) times daily.  . Lidocaine HCl 4 % PTCH Apply 1 patch topically daily. Apply to lower back daily and remove at [facility] for chronic back pain.  Marland Kitchen losartan (COZAAR) 25 MG tablet Take 0.5 tablets (12.5 mg total) by mouth daily.  . memantine (NAMENDA) 10 MG tablet Take 10 mg by mouth 2 (two) times daily.  . metroNIDAZOLE (METROGEL) 1 % gel Apply 1 application topically daily. Special Instructions: Wash and dry face. Wait 15 minutes, then appy a thin layer of medication.Dx: rosacea  . mirtazapine (REMERON) 30 MG tablet Take 30 mg by mouth at bedtime.   .  Multiple Vitamin (MULTIVITAMIN WITH MINERALS) TABS tablet Take 1 tablet by mouth daily.  . NON FORMULARY Diet: _____ Regular, ______ NAS, ___x____Consistent Carbohydrate, _______NPO _____Other  . omeprazole (PRILOSEC) 40 MG capsule Take 1 capsule (40 mg total) by mouth 2 (two) times daily.  Marland Kitchen oxyCODONE (ROXICODONE) 5 MG immediate release tablet Take 1 tablet (5 mg total) by mouth in the morning and at bedtime.  . primidone (MYSOLINE) 50 MG tablet Take 25 mg by mouth at bedtime.  Marland Kitchen zolpidem (AMBIEN) 5 MG tablet Take 1 tablet (5 mg total) by mouth at bedtime as needed for up to 14 days for sleep.  . [DISCONTINUED] amLODipine (NORVASC) 5 MG tablet Take 1 tablet (5 mg total) by mouth daily.   No facility-administered encounter medications on file as of 04/06/2020.     SIGNIFICANT DIAGNOSTIC EXAMS  PREVIOUS   03-19-20: chest x-ray:  No active disease. Chronic elevation of right hemidiaphragm.  03-19-20: ct of abdomen and pelvis:  1. Moderate to severe small bowel dilatation is noted concerning for distal small bowel obstruction. Definite transition zone is not identified, although most likely is in the pelvis as there is nondilated ileum in this area. Aortic Atherosclerosis   03-21-20: KUB 1. Persistent small bowel dilatation, with no significant transit of oral contrast compatible with small-bowel obstruction.  NO NEW EXAMS.    LABS REVIEWED: PREVIOUS   03-19-20: wbc 11.8; hgb 12.0; hct 38.8; mcv 81.3 plt 273; glucose 137; bun 36; creat 1.65; k+ 4.0; na++ 136; ca 9.4 liver normal albumin 4.0; hgb a1c 6.2 03-23-20: wbc 7.7; hgb 9.9; hct 31.6; mcv 81.7 plt 216; glucose 128; bun 22; creat 0.92; k+ 3.8; na++ 138 ca 8,3 mag 1.7 phos 2.5   NO NEW LABS.   Review of Systems  Constitutional: Negative for malaise/fatigue.  Respiratory: Negative for cough and shortness of breath.   Cardiovascular: Positive for leg swelling. Negative for chest pain and palpitations.  Gastrointestinal: Negative  for abdominal pain, constipation and heartburn.  Musculoskeletal: Positive for back pain. Negative for joint pain and myalgias.  Skin: Negative.   Neurological: Negative for dizziness.  Psychiatric/Behavioral: The patient is not nervous/anxious.  Physical Exam Constitutional:      General: She is not in acute distress.    Appearance: She is well-developed. She is not diaphoretic.  Neck:     Thyroid: No thyromegaly.  Cardiovascular:     Rate and Rhythm: Normal rate and regular rhythm.     Pulses: Normal pulses.     Heart sounds: Normal heart sounds.  Pulmonary:     Effort: Pulmonary effort is normal. No respiratory distress.     Breath sounds: Normal breath sounds.  Abdominal:     General: Bowel sounds are normal. There is no distension.     Palpations: Abdomen is soft.     Tenderness: There is no abdominal tenderness.  Musculoskeletal:        General: Normal range of motion.     Cervical back: Neck supple.     Right lower leg: Edema present.     Left lower leg: Edema present.     Comments: 1+ bilateral lower extremity edema   Lymphadenopathy:     Cervical: No cervical adenopathy.  Skin:    General: Skin is warm and dry.     Comments: Incision line staple removed   Neurological:     Mental Status: She is alert and oriented to person, place, and time.  Psychiatric:        Mood and Affect: Mood normal.       ASSESSMENT/ PLAN:  TODAY  1. Small bowel obstruction 2. Fibromyalgia 3. Hypertension associated with type 2 diabetes mellitus 4. Bilateral lower extremity edema 5. Chronic bilateral lower back pain without sciatica  Will continue therapy as directed Will start oxycodone 5 mg twice daily  Will being wear TED hose daily Will stop norvasc due to edema Will check blood pressure twice daily  Her goal will be to go to assisted living as early as next week Will continue to monitor her status.      MD is aware of resident's narcotic use and is in agreement  with current plan of care. We will attempt to wean resident as appropriate.  Ok Edwards NP [facility]  Contact [phone] Monday through Friday 8am- 5pm  After hours call [phone]

## 2020-04-07 DIAGNOSIS — R600 Localized edema: Secondary | ICD-10-CM | POA: Insufficient documentation

## 2020-04-10 ENCOUNTER — Non-Acute Institutional Stay (SKILLED_NURSING_FACILITY): Payer: Medicare Other | Admitting: Adult Health

## 2020-04-10 ENCOUNTER — Encounter: Payer: Self-pay | Admitting: Adult Health

## 2020-04-10 DIAGNOSIS — F339 Major depressive disorder, recurrent, unspecified: Secondary | ICD-10-CM | POA: Diagnosis not present

## 2020-04-10 NOTE — Progress Notes (Signed)
Location:    [place] Room Number: 128/P Place of Service:  SNF (31)   CODE STATUS: Full Code  Allergies  Allergen Reactions  . Penicillins Rash    Has patient had a PCN reaction causing immediate rash, facial/tongue/throat swelling, SOB or lightheadedness with hypotension: No Has patient had a PCN reaction causing severe rash involving mucus membranes or skin necrosis: No Has patient had a PCN reaction that required hospitalization: No Has patient had a PCN reaction occurring within the last 10 years: No If all of the above answers are "NO", then may proceed with Cephalosporin use.     Chief Complaint  Patient presents with  . Acute Visit    Anxiety     HPI:  She is on long term xanax 0.5 mg three times daily as needed. She states that she has needed this medication "for years". We have discussed the function and purpose of benzos. She has verbalized understanding.   Past Medical History:  Diagnosis Date  . Anxiety   . Arthritis   . Cataracts, bilateral   . Depression   . Diabetes mellitus   . Diabetic peripheral neuropathy ([place])   . Fibromyalgia   . GERD (gastroesophageal reflux disease)   . Hyperlipidemia   . Hypertension   . Leg swelling   . Shortness of breath    with exertion  . Weight loss   . Wheezing     Past Surgical History:  Procedure Laterality Date  . ABDOMINAL HYSTERECTOMY    . CATARACT EXTRACTION W/PHACO  12/10/2011   Procedure: CATARACT EXTRACTION PHACO AND INTRAOCULAR LENS PLACEMENT (IOC);  Surgeon: Elta Guadeloupe T. Gershon Crane, MD;  Location: [facility];  Service: Ophthalmology;  Laterality: Left;  CDE=10.72  . CATARACT EXTRACTION W/PHACO  01/14/2012   Procedure: CATARACT EXTRACTION PHACO AND INTRAOCULAR LENS PLACEMENT (IOC);  Surgeon: Elta Guadeloupe T. Gershon Crane, MD;  Location: [facility];  Service: Ophthalmology;  Laterality: Right;  CDE:11.63  . ESOPHAGOGASTRODUODENOSCOPY N/A 07/18/2019   Procedure: ESOPHAGOGASTRODUODENOSCOPY (EGD);  Surgeon: Rogene Houston, MD; multiple gastric and duodenal ulcers, mild Schatzki ring in the distal esophagus, LA grade B esophagitis, irregular Z-line.  Repeat endoscopy in 3 months.  . ESOPHAGOGASTRODUODENOSCOPY (EGD) WITH ESOPHAGEAL DILATION N/A 06/17/2013   [id number] esophagus-status post Venia Minks dilation. Hiatal hernia. Gastric erosions s/p bx  . ESOPHAGOGASTRODUODENOSCOPY (EGD) WITH PROPOFOL N/A 11/25/2019   Procedure: ESOPHAGOGASTRODUODENOSCOPY (EGD) WITH PROPOFOL;  Surgeon: Daneil Dolin, MD; Normal esophagus, erosive gastropathy with some crpe appearance much better than previous EGD, superficial erosions/ulcerations small and scattered in the posterior duodenal bulb and second portion of the duodenum.  Gastric biop.-chronic gastritis w/o H. pylori.  Duodenal biop.-peptic duodenitis/ulceration  . foreign body removal     splinter removed from right side of face  . LAPAROTOMY N/A 03/22/2020   Procedure: EXPLORATORY LAPAROTOMY;  Surgeon: [name], MD;  Location: [facility];  Service: General;  Laterality: N/A;  . LYSIS OF ADHESION N/A 03/22/2020   Procedure: LYSIS OF ADHESIONS;  Surgeon: [name], MD;  Location: [facility];  Service: General;  Laterality: N/A;    Social History   Socioeconomic History  . Marital status: Widowed    Spouse name: Not on file  . Number of children: 4  . Years of education: Not on file  . Highest education level: Not on file  Occupational History  . Not on file  Tobacco Use  . Smoking status: Former Smoker    Quit date: 08/30/1994    Years since quitting:  25.6  . Smokeless tobacco: Never Used  Vaping Use  . Vaping Use: Never used  Substance and Sexual Activity  . Alcohol use: No    Alcohol/week: 0.0 standard drinks  . Drug use: No  . Sexual activity: Not Currently  Other Topics Concern  . Not on file  Social History Narrative   Adopted granddaughter and raised great grandson.   Pt states that her grandson lives with her sometimes,    Social  Determinants of Health   Financial Resource Strain: Low Risk   . Difficulty of Paying Living Expenses: Not hard at all  Food Insecurity: No Food Insecurity  . Worried About Charity fundraiser in the Last Year: Never true  . Ran Out of Food in the Last Year: Never true  Transportation Needs: No Transportation Needs  . Lack of Transportation (Medical): No  . Lack of Transportation (Non-Medical): No  Physical Activity: Inactive  . Days of Exercise per Week: 0 days  . Minutes of Exercise per Session: 0 min  Stress: Stress Concern Present  . Feeling of Stress : To some extent  Social Connections: Moderately Integrated  . Frequency of Communication with Friends and Family: More than three times a week  . Frequency of Social Gatherings with Friends and Family: More than three times a week  . Attends Religious Services: More than 4 times per year  . Active Member of Clubs or Organizations: Yes  . Attends Archivist Meetings: More than 4 times per year  . Marital Status: Widowed  Intimate Partner Violence: Not At Risk  . Fear of Current or Ex-Partner: No  . Emotionally Abused: No  . Physically Abused: No  . Sexually Abused: No   Family History  Problem Relation Age of Onset  . Alzheimer's disease Mother   . Cancer Father   . Diabetes Daughter   . High blood pressure Daughter   . High blood pressure Daughter   . Cancer Sister   . Cancer Brother   . Pseudochol deficiency Neg Hx   . Malignant hyperthermia Neg Hx   . Hypotension Neg Hx   . Anesthesia problems Neg Hx   . Colon cancer Neg Hx   . Stomach cancer Neg Hx       VITAL SIGNS BP 136/74   Pulse 84   Temp 97.6 F (36.4 C) (Oral)   Resp 20   Ht 5\' 2"  (1.575 m)   Wt 169 lb 9.6 oz (76.9 kg)   SpO2 97%   BMI 31.02 kg/m   Outpatient Encounter Medications as of 04/10/2020  Medication Sig  . acetaminophen (TYLENOL) 325 MG tablet Take 2 tablets (650 mg total) by mouth every 6 (six) hours as needed for mild pain  or headache (or Fever >/= 101).  Marland Kitchen albuterol (VENTOLIN HFA) 108 (90 Base) MCG/ACT inhaler Inhale 2 puffs into the lungs every 4 (four) hours as needed.   . ALPRAZolam (XANAX) 0.5 MG tablet Take 1 tablet (0.5 mg total) by mouth 3 (three) times daily as needed.  Marland Kitchen atorvastatin (LIPITOR) 20 MG tablet Take 1 tablet (20 mg total) by mouth daily.  . busPIRone (BUSPAR) 10 MG tablet Take 10 mg by mouth 2 (two) times daily.  . Cholecalciferol (VITAMIN D-3) 1000 UNITS CAPS Take 1 capsule by mouth 2 (two) times daily.   Marland Kitchen gabapentin (NEURONTIN) 300 MG capsule Take 1 capsule (300 mg total) by mouth 3 (three) times daily.  . Lidocaine HCl 4 % PTCH Apply 1  patch topically daily. Apply to lower back daily and remove at [facility] for chronic back pain.  Marland Kitchen losartan (COZAAR) 25 MG tablet Take 0.5 tablets (12.5 mg total) by mouth daily.  . memantine (NAMENDA) 10 MG tablet Take 10 mg by mouth 2 (two) times daily.  . Menthol, Topical Analgesic, (BIOFREEZE) 4 % GEL Apply 1 application topically daily as needed (Small Amount).  . metroNIDAZOLE (METROGEL) 1 % gel Apply 1 application topically daily. Special Instructions: Wash and dry face. Wait 15 minutes, then appy a thin layer of medication.Dx: rosacea  . mirtazapine (REMERON) 30 MG tablet Take 30 mg by mouth at bedtime.   . Multiple Vitamin (MULTIVITAMIN WITH MINERALS) TABS tablet Take 1 tablet by mouth daily.  . NON FORMULARY Diet: _____ Regular, ______ NAS, ___x____Consistent Carbohydrate, _______NPO _____Other  . omeprazole (PRILOSEC) 40 MG capsule Take 1 capsule (40 mg total) by mouth 2 (two) times daily.  Marland Kitchen oxyCODONE (ROXICODONE) 5 MG immediate release tablet Take 1 tablet (5 mg total) by mouth in the morning and at bedtime.  . primidone (MYSOLINE) 50 MG tablet Take 25 mg by mouth at bedtime.  Marland Kitchen zolpidem (AMBIEN) 5 MG tablet Take 1 tablet (5 mg total) by mouth at bedtime as needed for up to 14 days for sleep.   No facility-administered encounter medications on file  as of 04/10/2020.     SIGNIFICANT DIAGNOSTIC EXAMS   PREVIOUS   03-19-20: chest x-ray:  No active disease. Chronic elevation of right hemidiaphragm.  03-19-20: ct of abdomen and pelvis:  1. Moderate to severe small bowel dilatation is noted concerning for distal small bowel obstruction. Definite transition zone is not identified, although most likely is in the pelvis as there is nondilated ileum in this area. Aortic Atherosclerosis   03-21-20: KUB 1. Persistent small bowel dilatation, with no significant transit of oral contrast compatible with small-bowel obstruction.  NO NEW EXAMS.    LABS REVIEWED: PREVIOUS   03-19-20: wbc 11.8; hgb 12.0; hct 38.8; mcv 81.3 plt 273; glucose 137; bun 36; creat 1.65; k+ 4.0; na++ 136; ca 9.4 liver normal albumin 4.0; hgb a1c 6.2 03-23-20: wbc 7.7; hgb 9.9; hct 31.6; mcv 81.7 plt 216; glucose 128; bun 22; creat 0.92; k+ 3.8; na++ 138 ca 8,3 mag 1.7 phos 2.5   NO NEW LABS.   Review of Systems  Constitutional: Negative for malaise/fatigue.  Respiratory: Negative for cough and shortness of breath.   Cardiovascular: Negative for chest pain, palpitations and leg swelling.  Gastrointestinal: Negative for abdominal pain, constipation and heartburn.  Musculoskeletal: Negative for back pain, joint pain and myalgias.  Skin: Negative.   Neurological: Negative for dizziness.  Psychiatric/Behavioral: The patient is not nervous/anxious.     Physical Exam Constitutional:      General: She is not in acute distress.    Appearance: She is well-developed. She is not diaphoretic.  Neck:     Thyroid: No thyromegaly.  Cardiovascular:     Rate and Rhythm: Normal rate and regular rhythm.     Pulses: Normal pulses.     Heart sounds: Normal heart sounds.  Pulmonary:     Effort: Pulmonary effort is normal. No respiratory distress.     Breath sounds: Normal breath sounds.  Abdominal:     General: Bowel sounds are normal. There is no distension.     Palpations:  Abdomen is soft.     Tenderness: There is no abdominal tenderness.  Musculoskeletal:        General: Normal range of  motion.     Cervical back: Neck supple.     Right lower leg: Edema present.     Left lower leg: Edema present.     Comments: 1+ bilateral lower extremity edema   Lymphadenopathy:     Cervical: No cervical adenopathy.  Skin:    General: Skin is warm and dry.  Neurological:     Mental Status: She is alert and oriented to person, place, and time.  Psychiatric:        Mood and Affect: Mood normal.       ASSESSMENT/ PLAN:  TODAY  1. Major depression recurrent chronic: is stable will continue xanax 0.5 mg three times daily as needed through 04-24-20  MD is aware of resident's narcotic use and is in agreement with current plan of care. We will attempt to wean resident as appropriate.  Ok Edwards NP [facility]  Contact [phone] Monday through Friday 8am- 5pm  After hours call [phone]

## 2020-04-12 ENCOUNTER — Non-Acute Institutional Stay (SKILLED_NURSING_FACILITY): Payer: Medicare Other | Admitting: Adult Health

## 2020-04-12 ENCOUNTER — Encounter: Payer: Self-pay | Admitting: Adult Health

## 2020-04-12 DIAGNOSIS — E785 Hyperlipidemia, unspecified: Secondary | ICD-10-CM | POA: Diagnosis not present

## 2020-04-12 DIAGNOSIS — E1169 Type 2 diabetes mellitus with other specified complication: Secondary | ICD-10-CM

## 2020-04-12 DIAGNOSIS — E1143 Type 2 diabetes mellitus with diabetic autonomic (poly)neuropathy: Secondary | ICD-10-CM | POA: Diagnosis not present

## 2020-04-12 DIAGNOSIS — E1159 Type 2 diabetes mellitus with other circulatory complications: Secondary | ICD-10-CM | POA: Diagnosis not present

## 2020-04-12 DIAGNOSIS — I10 Essential (primary) hypertension: Secondary | ICD-10-CM

## 2020-04-12 DIAGNOSIS — I152 Hypertension secondary to endocrine disorders: Secondary | ICD-10-CM

## 2020-04-12 NOTE — Progress Notes (Signed)
Location:    [place] Room Number: 128/P Place of Service:  SNF (31)   CODE STATUS: Full Code  Allergies  Allergen Reactions  . Penicillins Rash    Has patient had a PCN reaction causing immediate rash, facial/tongue/throat swelling, SOB or lightheadedness with hypotension: No Has patient had a PCN reaction causing severe rash involving mucus membranes or skin necrosis: No Has patient had a PCN reaction that required hospitalization: No Has patient had a PCN reaction occurring within the last 10 years: No If all of the above answers are "NO", then may proceed with Cephalosporin use.     Chief Complaint  Patient presents with  . Short Term Rehab (STR)         Type 2 diabetes mellitus with diabetic autonomic neuropathy without long term current use of insulin:   Hyperlipidemia associated with type 2 diabetes mellitus:    Hypertension associated with type 2 diabetes mellitus    Weekly follow up for the first 30 days post hospitalization.     HPI:  She is a 85 year old short term rehab patient being seen for the management of her chronic illnesses:Type 2 diabetes mellitus with diabetic autonomic neuropathy without long term current use of insulin:    Hyperlipidemia associated with type 2 diabetes mellitus:   Hypertension associated with type 2 diabetes mellitus .  There are no reports of uncontrolled pain; no changes in appetite; no reports of constipation; no reports of anxiety.    Past Medical History:  Diagnosis Date  . Anxiety   . Arthritis   . Cataracts, bilateral   . Depression   . Diabetes mellitus   . Diabetic peripheral neuropathy ([place])   . Fibromyalgia   . GERD (gastroesophageal reflux disease)   . Hyperlipidemia   . Hypertension   . Leg swelling   . Shortness of breath    with exertion  . Weight loss   . Wheezing     Past Surgical History:  Procedure Laterality Date  . ABDOMINAL HYSTERECTOMY    . CATARACT EXTRACTION W/PHACO  12/10/2011     Procedure: CATARACT EXTRACTION PHACO AND INTRAOCULAR LENS PLACEMENT (IOC);  Surgeon: Elta Guadeloupe T. Gershon Crane, MD;  Location: [facility];  Service: Ophthalmology;  Laterality: Left;  CDE=10.72  . CATARACT EXTRACTION W/PHACO  01/14/2012   Procedure: CATARACT EXTRACTION PHACO AND INTRAOCULAR LENS PLACEMENT (IOC);  Surgeon: Elta Guadeloupe T. Gershon Crane, MD;  Location: [facility];  Service: Ophthalmology;  Laterality: Right;  CDE:11.63  . ESOPHAGOGASTRODUODENOSCOPY N/A 07/18/2019   Procedure: ESOPHAGOGASTRODUODENOSCOPY (EGD);  Surgeon: Rogene Houston, MD; multiple gastric and duodenal ulcers, mild Schatzki ring in the distal esophagus, LA grade B esophagitis, irregular Z-line.  Repeat endoscopy in 3 months.  . ESOPHAGOGASTRODUODENOSCOPY (EGD) WITH ESOPHAGEAL DILATION N/A 06/17/2013   FTD:DUKGUR esophagus-status post Venia Minks dilation. Hiatal hernia. Gastric erosions s/p bx  . ESOPHAGOGASTRODUODENOSCOPY (EGD) WITH PROPOFOL N/A 11/25/2019   Procedure: ESOPHAGOGASTRODUODENOSCOPY (EGD) WITH PROPOFOL;  Surgeon: Daneil Dolin, MD; Normal esophagus, erosive gastropathy with some crpe appearance much better than previous EGD, superficial erosions/ulcerations small and scattered in the posterior duodenal bulb and second portion of the duodenum.  Gastric biop.-chronic gastritis w/o H. pylori.  Duodenal biop.-peptic duodenitis/ulceration  . foreign body removal     splinter removed from right side of face  . LAPAROTOMY N/A 03/22/2020   Procedure: EXPLORATORY LAPAROTOMY;  Surgeon: [name], MD;  Location: [facility];  Service: General;  Laterality: N/A;  . LYSIS OF ADHESION N/A 03/22/2020  Procedure: LYSIS OF ADHESIONS;  Surgeon: [name], MD;  Location: [facility];  Service: General;  Laterality: N/A;    Social History   Socioeconomic History  . Marital status: Widowed    Spouse name: Not on file  . Number of children: 4  . Years of education: Not on file  . Highest education level: Not on file  Occupational History  . Not on  file  Tobacco Use  . Smoking status: Former Smoker    Quit date: 08/30/1994    Years since quitting: 25.6  . Smokeless tobacco: Never Used  Vaping Use  . Vaping Use: Never used  Substance and Sexual Activity  . Alcohol use: No    Alcohol/week: 0.0 standard drinks  . Drug use: No  . Sexual activity: Not Currently  Other Topics Concern  . Not on file  Social History Narrative   Adopted granddaughter and raised great grandson.   Pt states that her grandson lives with her sometimes,    Social Determinants of Health   Financial Resource Strain: Low Risk   . Difficulty of Paying Living Expenses: Not hard at all  Food Insecurity: No Food Insecurity  . Worried About Charity fundraiser in the Last Year: Never true  . Ran Out of Food in the Last Year: Never true  Transportation Needs: No Transportation Needs  . Lack of Transportation (Medical): No  . Lack of Transportation (Non-Medical): No  Physical Activity: Inactive  . Days of Exercise per Week: 0 days  . Minutes of Exercise per Session: 0 min  Stress: Stress Concern Present  . Feeling of Stress : To some extent  Social Connections: Moderately Integrated  . Frequency of Communication with Friends and Family: More than three times a week  . Frequency of Social Gatherings with Friends and Family: More than three times a week  . Attends Religious Services: More than 4 times per year  . Active Member of Clubs or Organizations: Yes  . Attends Archivist Meetings: More than 4 times per year  . Marital Status: Widowed  Intimate Partner Violence: Not At Risk  . Fear of Current or Ex-Partner: No  . Emotionally Abused: No  . Physically Abused: No  . Sexually Abused: No   Family History  Problem Relation Age of Onset  . Alzheimer's disease Mother   . Cancer Father   . Diabetes Daughter   . High blood pressure Daughter   . High blood pressure Daughter   . Cancer Sister   . Cancer Brother   . Pseudochol deficiency Neg  Hx   . Malignant hyperthermia Neg Hx   . Hypotension Neg Hx   . Anesthesia problems Neg Hx   . Colon cancer Neg Hx   . Stomach cancer Neg Hx       VITAL SIGNS BP 128/62   Pulse 60   Temp 97.8 F (36.6 C) (Oral)   Resp 20   Ht 5\' 2"  (1.575 m)   Wt 173 lb (78.5 kg)   SpO2 100%   BMI 31.64 kg/m   Outpatient Encounter Medications as of 04/12/2020  Medication Sig  . acetaminophen (TYLENOL) 325 MG tablet Take 2 tablets (650 mg total) by mouth every 6 (six) hours as needed for mild pain or headache (or Fever >/= 101).  Marland Kitchen albuterol (VENTOLIN HFA) 108 (90 Base) MCG/ACT inhaler Inhale 2 puffs into the lungs every 4 (four) hours as needed.   . ALPRAZolam (XANAX) 0.5 MG tablet Take 1  tablet (0.5 mg total) by mouth 3 (three) times daily as needed.  Marland Kitchen atorvastatin (LIPITOR) 20 MG tablet Take 1 tablet (20 mg total) by mouth daily.  . busPIRone (BUSPAR) 10 MG tablet Take 10 mg by mouth 2 (two) times daily.  . Cholecalciferol (VITAMIN D-3) 1000 UNITS CAPS Take 1 capsule by mouth 2 (two) times daily.   Marland Kitchen gabapentin (NEURONTIN) 300 MG capsule Take 1 capsule (300 mg total) by mouth 3 (three) times daily.  . Lidocaine HCl 4 % PTCH Apply 1 patch topically daily. Apply to lower back daily and remove at [facility] for chronic back pain.  Marland Kitchen losartan (COZAAR) 25 MG tablet Take 0.5 tablets (12.5 mg total) by mouth daily.  . memantine (NAMENDA) 10 MG tablet Take 10 mg by mouth 2 (two) times daily.  . Menthol, Topical Analgesic, (BIOFREEZE) 4 % GEL Apply 1 application topically daily as needed (Small Amount).  . metroNIDAZOLE (METROGEL) 1 % gel Apply 1 application topically daily. Special Instructions: Wash and dry face. Wait 15 minutes, then appy a thin layer of medication.Dx: rosacea  . mirtazapine (REMERON) 30 MG tablet Take 30 mg by mouth at bedtime.   . Multiple Vitamin (MULTIVITAMIN WITH MINERALS) TABS tablet Take 1 tablet by mouth daily.  . NON FORMULARY Diet: _____ Regular, ______ NAS, ___x____Consistent  Carbohydrate, _______NPO _____Other  . omeprazole (PRILOSEC) 40 MG capsule Take 1 capsule (40 mg total) by mouth 2 (two) times daily.  Marland Kitchen oxyCODONE (ROXICODONE) 5 MG immediate release tablet Take 1 tablet (5 mg total) by mouth in the morning and at bedtime.  . primidone (MYSOLINE) 50 MG tablet Take 25 mg by mouth at bedtime.  Marland Kitchen zolpidem (AMBIEN) 5 MG tablet Take 1 tablet (5 mg total) by mouth at bedtime as needed for up to 14 days for sleep.   No facility-administered encounter medications on file as of 04/12/2020.     SIGNIFICANT DIAGNOSTIC EXAMS   PREVIOUS   03-19-20: chest x-ray:  No active disease. Chronic elevation of right hemidiaphragm.  03-19-20: ct of abdomen and pelvis:  1. Moderate to severe small bowel dilatation is noted concerning for distal small bowel obstruction. Definite transition zone is not identified, although most likely is in the pelvis as there is nondilated ileum in this area. Aortic Atherosclerosis   03-21-20: KUB 1. Persistent small bowel dilatation, with no significant transit of oral contrast compatible with small-bowel obstruction.  NO NEW EXAMS.    LABS REVIEWED: PREVIOUS   03-19-20: wbc 11.8; hgb 12.0; hct 38.8; mcv 81.3 plt 273; glucose 137; bun 36; creat 1.65; k+ 4.0; na++ 136; ca 9.4 liver normal albumin 4.0; hgb a1c 6.2 03-23-20: wbc 7.7; hgb 9.9; hct 31.6; mcv 81.7 plt 216; glucose 128; bun 22; creat 0.92; k+ 3.8; na++ 138 ca 8,3 mag 1.7 phos 2.5   TODAY  04-03-20: hgb 11.0; hct 35.5; glucose 119; bun 15; creat 0.90; k+ 2.8; na++ 140; ca 9.0   Review of Systems  Constitutional: Negative for malaise/fatigue.  Respiratory: Negative for cough and shortness of breath.   Cardiovascular: Negative for chest pain, palpitations and leg swelling.  Gastrointestinal: Negative for abdominal pain, constipation and heartburn.  Musculoskeletal: Negative for back pain, joint pain and myalgias.  Skin: Negative.   Neurological: Negative for dizziness.    Psychiatric/Behavioral: The patient is not nervous/anxious.     Physical Exam Constitutional:      General: She is not in acute distress.    Appearance: She is well-developed. She is not diaphoretic.  Neck:     Thyroid: No thyromegaly.  Cardiovascular:     Rate and Rhythm: Normal rate and regular rhythm.     Pulses: Normal pulses.     Heart sounds: Normal heart sounds.  Pulmonary:     Effort: Pulmonary effort is normal. No respiratory distress.     Breath sounds: Normal breath sounds.  Abdominal:     General: Bowel sounds are normal. There is no distension.     Palpations: Abdomen is soft.     Tenderness: There is no abdominal tenderness.  Musculoskeletal:        General: Normal range of motion.     Cervical back: Neck supple.     Right lower leg: Edema present.     Left lower leg: Edema present.     Comments: Trace bilateral lower extremity edema   Lymphadenopathy:     Cervical: No cervical adenopathy.  Skin:    General: Skin is warm and dry.  Neurological:     Mental Status: She is alert and oriented to person, place, and time.  Psychiatric:        Mood and Affect: Mood normal.      ASSESSMENT/ PLAN:  TODAY  1. Type 2 diabetes mellitus with diabetic autonomic neuropathy without long term current use of insulin: is stable hgb a1c 6.2 will monitor is on arb ad statin  2. Hyperlipidemia associated with type 2 diabetes mellitus: is stable will continue lipitor 20 mg daily   3. Hypertension associated with type 2 diabetes mellitus is stable b/p 128/62 will continue norvasc 5 mg daily cozaar 12.5 mg daily    PREVIOUS   4. gastroesophageal reflux disease without esophagitis: is stable will continue protonix 40 mg twice daily   5. vascular dementia with behavioral disturbance; is stable weight is 162 pounds; will continue namenda 10 mg twice daily   6. 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  Essential tremor: is stable  will continue primidone 25 mg daily  8. Major depression recurrent chronic: is stable will continue buspar 10 mg twice daily remeron 30 mg nightly ambien 5 mg nightly for sleep has xanax 0.5 mg three times daily prn through 04-10-20  9. Aortic atherosclerosis: is stable will monitor  10. Diabetic peripheral neuropathy: is stable will continue gabapentin 300 mg three times daily   11. Chronic bilateral lower back pain without sciatica: is stable will continue lidoderm 4% patch daily; will continue oxycodone 5 mg twice daily   12. Small bowel obstruction: is status post exp lap with lysis of adhesions; will monitor   13. Acute blood loss anemia: is stable hgb 9.9 will monitor  14. Fibromyalgia: is stable will monitor   MD is aware of resident's narcotic use and is in agreement with current plan of care. We will attempt to wean resident as appropriate.  Ok Edwards NP [facility]  Contact [phone] Monday through Friday 8am- 5pm  After hours call [phone]

## 2020-04-13 ENCOUNTER — Other Ambulatory Visit (HOSPITAL_COMMUNITY)
Admission: RE | Admit: 2020-04-13 | Discharge: 2020-04-13 | Disposition: A | Discharge status: Home / Self Care | Payer: Medicare Other | Source: Skilled Nursing Facility | Attending: Adult Health | Admitting: Adult Health

## 2020-04-13 ENCOUNTER — Non-Acute Institutional Stay (SKILLED_NURSING_FACILITY): Payer: Medicare Other | Admitting: Adult Health

## 2020-04-13 ENCOUNTER — Encounter: Payer: Self-pay | Admitting: Adult Health

## 2020-04-13 DIAGNOSIS — F03A0 Unspecified dementia, mild, without behavioral disturbance, psychotic disturbance, mood disturbance, and anxiety: Secondary | ICD-10-CM

## 2020-04-13 DIAGNOSIS — E876 Hypokalemia: Secondary | ICD-10-CM | POA: Insufficient documentation

## 2020-04-13 DIAGNOSIS — F0390 Unspecified dementia without behavioral disturbance: Secondary | ICD-10-CM | POA: Diagnosis not present

## 2020-04-13 DIAGNOSIS — K56609 Unspecified intestinal obstruction, unspecified as to partial versus complete obstruction: Secondary | ICD-10-CM | POA: Diagnosis not present

## 2020-04-13 DIAGNOSIS — I10 Essential (primary) hypertension: Secondary | ICD-10-CM | POA: Diagnosis not present

## 2020-04-13 DIAGNOSIS — E1159 Type 2 diabetes mellitus with other circulatory complications: Secondary | ICD-10-CM

## 2020-04-13 LAB — POTASSIUM: Potassium: 3.5 mmol/L (ref 3.5–5.1)

## 2020-04-13 NOTE — Progress Notes (Signed)
Location:    [place] Room Number: 128/P Place of Service:  SNF (31)   CODE STATUS: Full Code  Allergies  Allergen Reactions  . Penicillins Rash    Has patient had a PCN reaction causing immediate rash, facial/tongue/throat swelling, SOB or lightheadedness with hypotension: No Has patient had a PCN reaction causing severe rash involving mucus membranes or skin necrosis: No Has patient had a PCN reaction that required hospitalization: No Has patient had a PCN reaction occurring within the last 10 years: No If all of the above answers are "NO", then may proceed with Cephalosporin use.     Chief Complaint  Patient presents with  . Acute Visit    Care Plan Meeting    HPI:  We have come together for her care plan meeting. BIMS 13/15 mood 0/30. No reports of falls. Her weight is stable. She requires limited assist with adls. Is frequently incontinent bladder and bowel. Her goal is to go to assisted living. There are no reports of uncontrolled pain. No reports of constipation. She continues to be followed for her chronic illnesses including: Mild dementia Hypertension associated with type 2 diabetes mellitus Small bowel obstruction   Past Medical History:  Diagnosis Date  . Anxiety   . Arthritis   . Cataracts, bilateral   . Depression   . Diabetes mellitus   . Diabetic peripheral neuropathy ([place])   . Fibromyalgia   . GERD (gastroesophageal reflux disease)   . Hyperlipidemia   . Hypertension   . Leg swelling   . Shortness of breath    with exertion  . Weight loss   . Wheezing     Past Surgical History:  Procedure Laterality Date  . ABDOMINAL HYSTERECTOMY    . CATARACT EXTRACTION W/PHACO  12/10/2011   Procedure: CATARACT EXTRACTION PHACO AND INTRAOCULAR LENS PLACEMENT (IOC);  Surgeon: Elta Guadeloupe T. Gershon Crane, MD;  Location: [facility];  Service: Ophthalmology;  Laterality: Left;  CDE=10.72  . CATARACT EXTRACTION W/PHACO  01/14/2012   Procedure: CATARACT  EXTRACTION PHACO AND INTRAOCULAR LENS PLACEMENT (IOC);  Surgeon: Elta Guadeloupe T. Gershon Crane, MD;  Location: [facility];  Service: Ophthalmology;  Laterality: Right;  CDE:11.63  . ESOPHAGOGASTRODUODENOSCOPY N/A 07/18/2019   Procedure: ESOPHAGOGASTRODUODENOSCOPY (EGD);  Surgeon: Rogene Houston, MD; multiple gastric and duodenal ulcers, mild Schatzki ring in the distal esophagus, LA grade B esophagitis, irregular Z-line.  Repeat endoscopy in 3 months.  . ESOPHAGOGASTRODUODENOSCOPY (EGD) WITH ESOPHAGEAL DILATION N/A 06/17/2013   DJM:EQASTM esophagus-status post Venia Minks dilation. Hiatal hernia. Gastric erosions s/p bx  . ESOPHAGOGASTRODUODENOSCOPY (EGD) WITH PROPOFOL N/A 11/25/2019   Procedure: ESOPHAGOGASTRODUODENOSCOPY (EGD) WITH PROPOFOL;  Surgeon: Daneil Dolin, MD; Normal esophagus, erosive gastropathy with some crpe appearance much better than previous EGD, superficial erosions/ulcerations small and scattered in the posterior duodenal bulb and second portion of the duodenum.  Gastric biop.-chronic gastritis w/o H. pylori.  Duodenal biop.-peptic duodenitis/ulceration  . foreign body removal     splinter removed from right side of face  . LAPAROTOMY N/A 03/22/2020   Procedure: EXPLORATORY LAPAROTOMY;  Surgeon: [name], MD;  Location: [facility];  Service: General;  Laterality: N/A;  . LYSIS OF ADHESION N/A 03/22/2020   Procedure: LYSIS OF ADHESIONS;  Surgeon: [name], MD;  Location: [facility];  Service: General;  Laterality: N/A;    Social History   Socioeconomic History  . Marital status: Widowed    Spouse name: Not on file  . Number of children: 4  . Years of education: Not  on file  . Highest education level: Not on file  Occupational History  . Not on file  Tobacco Use  . Smoking status: Former Smoker    Quit date: 08/30/1994    Years since quitting: 25.6  . Smokeless tobacco: Never Used  Vaping Use  . Vaping Use: Never used  Substance and Sexual Activity  . Alcohol use: No     Alcohol/week: 0.0 standard drinks  . Drug use: No  . Sexual activity: Not Currently  Other Topics Concern  . Not on file  Social History Narrative   Adopted granddaughter and raised great grandson.   Pt states that her grandson lives with her sometimes,    Social Determinants of Health   Financial Resource Strain: Low Risk   . Difficulty of Paying Living Expenses: Not hard at all  Food Insecurity: No Food Insecurity  . Worried About Charity fundraiser in the Last Year: Never true  . Ran Out of Food in the Last Year: Never true  Transportation Needs: No Transportation Needs  . Lack of Transportation (Medical): No  . Lack of Transportation (Non-Medical): No  Physical Activity: Inactive  . Days of Exercise per Week: 0 days  . Minutes of Exercise per Session: 0 min  Stress: Stress Concern Present  . Feeling of Stress : To some extent  Social Connections: Moderately Integrated  . Frequency of Communication with Friends and Family: More than three times a week  . Frequency of Social Gatherings with Friends and Family: More than three times a week  . Attends Religious Services: More than 4 times per year  . Active Member of Clubs or Organizations: Yes  . Attends Archivist Meetings: More than 4 times per year  . Marital Status: Widowed  Intimate Partner Violence: Not At Risk  . Fear of Current or Ex-Partner: No  . Emotionally Abused: No  . Physically Abused: No  . Sexually Abused: No   Family History  Problem Relation Age of Onset  . Alzheimer's disease Mother   . Cancer Father   . Diabetes Daughter   . High blood pressure Daughter   . High blood pressure Daughter   . Cancer Sister   . Cancer Brother   . Pseudochol deficiency Neg Hx   . Malignant hyperthermia Neg Hx   . Hypotension Neg Hx   . Anesthesia problems Neg Hx   . Colon cancer Neg Hx   . Stomach cancer Neg Hx       VITAL SIGNS BP (!) 177/86   Pulse 97   Temp 98.4 F (36.9 C) (Oral)   Resp 20    Ht 5\' 2"  (1.575 m)   Wt 172 lb 9.6 oz (78.3 kg)   SpO2 100%   BMI 31.57 kg/m   Outpatient Encounter Medications as of 04/13/2020  Medication Sig  . acetaminophen (TYLENOL) 325 MG tablet Take 2 tablets (650 mg total) by mouth every 6 (six) hours as needed for mild pain or headache (or Fever >/= 101).  Marland Kitchen albuterol (VENTOLIN HFA) 108 (90 Base) MCG/ACT inhaler Inhale 2 puffs into the lungs every 4 (four) hours as needed.   . ALPRAZolam (XANAX) 0.5 MG tablet Take 1 tablet (0.5 mg total) by mouth 3 (three) times daily as needed.  Marland Kitchen atorvastatin (LIPITOR) 20 MG tablet Take 1 tablet (20 mg total) by mouth daily.  . busPIRone (BUSPAR) 10 MG tablet Take 10 mg by mouth 2 (two) times daily.  . Cholecalciferol (VITAMIN D-3) 1000  UNITS CAPS Take 1 capsule by mouth 2 (two) times daily.   Marland Kitchen gabapentin (NEURONTIN) 300 MG capsule Take 1 capsule (300 mg total) by mouth 3 (three) times daily.  . Lidocaine HCl 4 % PTCH Apply 1 patch topically daily. Apply to lower back daily and remove at [facility] for chronic back pain.  Marland Kitchen losartan (COZAAR) 25 MG tablet Take 0.5 tablets (12.5 mg total) by mouth daily.  . memantine (NAMENDA) 10 MG tablet Take 10 mg by mouth 2 (two) times daily.  . Menthol, Topical Analgesic, (BIOFREEZE) 4 % GEL Apply 1 application topically daily as needed (Small Amount).  . metroNIDAZOLE (METROGEL) 1 % gel Apply 1 application topically daily. Special Instructions: Wash and dry face. Wait 15 minutes, then appy a thin layer of medication.Dx: rosacea  . mirtazapine (REMERON) 30 MG tablet Take 30 mg by mouth at bedtime.   . Multiple Vitamin (MULTIVITAMIN WITH MINERALS) TABS tablet Take 1 tablet by mouth daily.  . NON FORMULARY Diet: _____ Regular, ______ NAS, ___x____Consistent Carbohydrate, _______NPO _____Other  . omeprazole (PRILOSEC) 40 MG capsule Take 1 capsule (40 mg total) by mouth 2 (two) times daily.  Marland Kitchen oxyCODONE (ROXICODONE) 5 MG immediate release tablet Take 1 tablet (5 mg total) by mouth  in the morning and at bedtime.  . primidone (MYSOLINE) 50 MG tablet Take 25 mg by mouth at bedtime.  Marland Kitchen zolpidem (AMBIEN) 5 MG tablet Take 1 tablet (5 mg total) by mouth at bedtime as needed for up to 14 days for sleep.   No facility-administered encounter medications on file as of 04/13/2020.     SIGNIFICANT DIAGNOSTIC EXAMS   PREVIOUS   03-19-20: chest x-ray:  No active disease. Chronic elevation of right hemidiaphragm.  03-19-20: ct of abdomen and pelvis:  1. Moderate to severe small bowel dilatation is noted concerning for distal small bowel obstruction. Definite transition zone is not identified, although most likely is in the pelvis as there is nondilated ileum in this area. Aortic Atherosclerosis   03-21-20: KUB 1. Persistent small bowel dilatation, with no significant transit of oral contrast compatible with small-bowel obstruction.  NO NEW EXAMS.    LABS REVIEWED: PREVIOUS   03-19-20: wbc 11.8; hgb 12.0; hct 38.8; mcv 81.3 plt 273; glucose 137; bun 36; creat 1.65; k+ 4.0; na++ 136; ca 9.4 liver normal albumin 4.0; hgb a1c 6.2 03-23-20: wbc 7.7; hgb 9.9; hct 31.6; mcv 81.7 plt 216; glucose 128; bun 22; creat 0.92; k+ 3.8; na++ 138 ca 8,3 mag 1.7 phos 2.5  04-03-20: hgb 11.0; hct 35.5; glucose 119; bun 15; creat 0.90; k+ 2.8; na++ 140; ca 9.0   NO NEW LABS.   Review of Systems  Constitutional: Negative for malaise/fatigue.  Respiratory: Negative for cough and shortness of breath.   Cardiovascular: Negative for chest pain, palpitations and leg swelling.  Gastrointestinal: Negative for abdominal pain, constipation and heartburn.  Musculoskeletal: Negative for back pain, joint pain and myalgias.  Skin: Negative.   Neurological: Negative for dizziness.  Psychiatric/Behavioral: The patient is not nervous/anxious.     Physical Exam Constitutional:      General: She is not in acute distress.    Appearance: She is well-developed. She is not diaphoretic.  Neck:     Thyroid: No  thyromegaly.  Cardiovascular:     Rate and Rhythm: Normal rate and regular rhythm.     Pulses: Normal pulses.     Heart sounds: Normal heart sounds.  Pulmonary:     Effort: Pulmonary effort is normal.  No respiratory distress.     Breath sounds: Normal breath sounds.  Abdominal:     General: Bowel sounds are normal. There is no distension.     Palpations: Abdomen is soft.     Tenderness: There is no abdominal tenderness.  Musculoskeletal:        General: Normal range of motion.     Cervical back: Neck supple.     Right lower leg: Edema present.     Left lower leg: Edema present.     Comments: Trace   Lymphadenopathy:     Cervical: No cervical adenopathy.  Skin:    General: Skin is warm and dry.  Neurological:     Mental Status: She is alert and oriented to person, place, and time.  Psychiatric:        Mood and Affect: Mood normal.       ASSESSMENT/ PLAN:  TODAY  1. Mild dementia 2. Hypertension associated with type 2 diabetes mellitus  3. Small bowel obstruction  Will continue current medications Will continue current plan of care Will continue to monitor her status.   MD is aware of resident's narcotic use and is in agreement with current plan of care. We will attempt to wean resident as appropriate.  Ok Edwards NP [facility]  Contact [phone] Monday through Friday 8am- 5pm  After hours call [phone]

## 2020-04-14 ENCOUNTER — Encounter: Payer: Self-pay | Admitting: Adult Health

## 2020-04-14 ENCOUNTER — Non-Acute Institutional Stay (SKILLED_NURSING_FACILITY): Payer: Medicare Other | Admitting: Adult Health

## 2020-04-14 DIAGNOSIS — I10 Essential (primary) hypertension: Secondary | ICD-10-CM

## 2020-04-14 DIAGNOSIS — I700 Atherosclerosis of aorta: Secondary | ICD-10-CM | POA: Diagnosis not present

## 2020-04-14 DIAGNOSIS — F0390 Unspecified dementia without behavioral disturbance: Secondary | ICD-10-CM

## 2020-04-14 DIAGNOSIS — F03A0 Unspecified dementia, mild, without behavioral disturbance, psychotic disturbance, mood disturbance, and anxiety: Secondary | ICD-10-CM

## 2020-04-14 DIAGNOSIS — E1159 Type 2 diabetes mellitus with other circulatory complications: Secondary | ICD-10-CM

## 2020-04-14 DIAGNOSIS — K56609 Unspecified intestinal obstruction, unspecified as to partial versus complete obstruction: Secondary | ICD-10-CM | POA: Diagnosis not present

## 2020-04-14 DIAGNOSIS — K219 Gastro-esophageal reflux disease without esophagitis: Secondary | ICD-10-CM | POA: Diagnosis not present

## 2020-04-14 DIAGNOSIS — E1143 Type 2 diabetes mellitus with diabetic autonomic (poly)neuropathy: Secondary | ICD-10-CM | POA: Diagnosis not present

## 2020-04-14 NOTE — Progress Notes (Signed)
Location:    [place] Room Number: 128/P Place of Service:  SNF (31)    CODE STATUS: Full Code  Allergies  Allergen Reactions   Penicillins Rash    Has patient had a PCN reaction causing immediate rash, facial/tongue/throat swelling, SOB or lightheadedness with hypotension: No Has patient had a PCN reaction causing severe rash involving mucus membranes or skin necrosis: No Has patient had a PCN reaction that required hospitalization: No Has patient had a PCN reaction occurring within the last 10 years: No If all of the above answers are "NO", then may proceed with Cephalosporin use.     Chief Complaint  Patient presents with   Discharge Note    Discharge to Assisted Living    HPI:  She is being discharged to assisted living with home health for pt. She will not need dme. She will follow up with her medical provider at the assisted living. Her medications will be provided by the assisted living. She had been hospitalized for a small bowel obstruction. She has had her pain medication reduced to oxycodone 5 mg twice daily. She has participated in pt/ot. She is unable to manage her adls in a home environment; will need assisted. She does have a guardian.    Past Medical History:  Diagnosis Date   Anxiety    Arthritis    Cataracts, bilateral    Depression    Diabetes mellitus    Diabetic peripheral neuropathy (HCC)    Fibromyalgia    GERD (gastroesophageal reflux disease)    Hyperlipidemia    Hypertension    Leg swelling    Shortness of breath    with exertion   Weight loss    Wheezing     Past Surgical History:  Procedure Laterality Date   ABDOMINAL HYSTERECTOMY     CATARACT EXTRACTION W/PHACO  12/10/2011   Procedure: CATARACT EXTRACTION PHACO AND INTRAOCULAR LENS PLACEMENT ([facility]);  Surgeon: Elta Guadeloupe T. Gershon Crane, MD;  Location: [facility];  Service: Ophthalmology;  Laterality: Left;  CDE=10.72   CATARACT EXTRACTION W/PHACO  01/14/2012     Procedure: CATARACT EXTRACTION PHACO AND INTRAOCULAR LENS PLACEMENT ([facility]);  Surgeon: Elta Guadeloupe T. Gershon Crane, MD;  Location: [facility];  Service: Ophthalmology;  Laterality: Right;  CDE:11.63   ESOPHAGOGASTRODUODENOSCOPY N/A 07/18/2019   Procedure: ESOPHAGOGASTRODUODENOSCOPY (EGD);  Surgeon: Rogene Houston, MD; multiple gastric and duodenal ulcers, mild Schatzki ring in the distal esophagus, LA grade B esophagitis, irregular Z-line.  Repeat endoscopy in 3 months.   ESOPHAGOGASTRODUODENOSCOPY (EGD) WITH ESOPHAGEAL DILATION N/A 06/17/2013   IBB:CWUGQB esophagus-status post Venia Minks dilation. Hiatal hernia. Gastric erosions s/p bx   ESOPHAGOGASTRODUODENOSCOPY (EGD) WITH PROPOFOL N/A 11/25/2019   Procedure: ESOPHAGOGASTRODUODENOSCOPY (EGD) WITH PROPOFOL;  Surgeon: Daneil Dolin, MD; Normal esophagus, erosive gastropathy with some crpe appearance much better than previous EGD, superficial erosions/ulcerations small and scattered in the posterior duodenal bulb and second portion of the duodenum.  Gastric biop.-chronic gastritis w/o H. pylori.  Duodenal biop.-peptic duodenitis/ulceration   foreign body removal     splinter removed from right side of face   LAPAROTOMY N/A 03/22/2020   Procedure: EXPLORATORY LAPAROTOMY;  Surgeon: [name], MD;  Location: [facility];  Service: General;  Laterality: N/A;   LYSIS OF ADHESION N/A 03/22/2020   Procedure: LYSIS OF ADHESIONS;  Surgeon: [name], MD;  Location: [facility];  Service: General;  Laterality: N/A;    Social History   Socioeconomic History   Marital status: Widowed    Spouse name: Not  on file   Number of children: 4   Years of education: Not on file   Highest education level: Not on file  Occupational History   Not on file  Tobacco Use   Smoking status: Former Smoker    Quit date: 08/30/1994    Years since quitting: 25.6   Smokeless tobacco: Never Used  Vaping Use   Vaping Use: Never used  Substance and Sexual Activity   Alcohol  use: No    Alcohol/week: 0.0 standard drinks   Drug use: No   Sexual activity: Not Currently  Other Topics Concern   Not on file  Social History Narrative   Adopted granddaughter and raised great grandson.   Pt states that her grandson lives with her sometimes,    Social Determinants of Radio broadcast assistant Strain: Low Risk    Difficulty of Paying Living Expenses: Not hard at all  Food Insecurity: No Food Insecurity   Worried About Charity fundraiser in the Last Year: Never true   Arboriculturist in the Last Year: Never true  Transportation Needs: No Transportation Needs   Lack of Transportation (Medical): No   Lack of Transportation (Non-Medical): No  Physical Activity: Inactive   Days of Exercise per Week: 0 days   Minutes of Exercise per Session: 0 min  Stress: Stress Concern Present   Feeling of Stress : To some extent  Social Connections: Moderately Integrated   Frequency of Communication with Friends and Family: More than three times a week   Frequency of Social Gatherings with Friends and Family: More than three times a week   Attends Religious Services: More than 4 times per year   Active Member of [employer] or Organizations: Yes   Attends Archivist Meetings: More than 4 times per year   Marital Status: Widowed  Human resources officer Violence: Not At Risk   Fear of Current or Ex-Partner: No   Emotionally Abused: No   Physically Abused: No   Sexually Abused: No   Family History  Problem Relation Age of Onset   Alzheimer's disease Mother    Cancer Father    Diabetes Daughter    High blood pressure Daughter    High blood pressure Daughter    Cancer Sister    Cancer Brother    Pseudochol deficiency Neg Hx    Malignant hyperthermia Neg Hx    Hypotension Neg Hx    Anesthesia problems Neg Hx    Colon cancer Neg Hx    Stomach cancer Neg Hx     VITAL SIGNS BP (!) 188/72    Pulse 90    Temp (!) 97.3 F (36.3 C) (Oral)     Resp 20    Ht 5\' 2"  (1.575 m)    Wt 171 lb (77.6 kg)    BMI 31.28 kg/m   Patient's Medications  New Prescriptions   No medications on file  Previous Medications   ACETAMINOPHEN (TYLENOL) 325 MG TABLET    Take 2 tablets (650 mg total) by mouth every 6 (six) hours as needed for mild pain or headache (or Fever >/= 101).   ALBUTEROL (VENTOLIN HFA) 108 (90 BASE) MCG/ACT INHALER    Inhale 2 puffs into the lungs every 4 (four) hours as needed.    ALPRAZOLAM (XANAX) 0.5 MG TABLET    Take 1 tablet (0.5 mg total) by mouth 3 (three) times daily as needed.   ATORVASTATIN (LIPITOR) 20 MG TABLET    Take  1 tablet (20 mg total) by mouth daily.   BUSPIRONE (BUSPAR) 10 MG TABLET    Take 10 mg by mouth 2 (two) times daily.   CHOLECALCIFEROL (VITAMIN D-3) 1000 UNITS CAPS    Take 1 capsule by mouth 2 (two) times daily.    GABAPENTIN (NEURONTIN) 300 MG CAPSULE    Take 1 capsule (300 mg total) by mouth 3 (three) times daily.   LIDOCAINE HCL 4 % PTCH    Apply 1 patch topically daily. Apply to lower back daily and remove at [facility] for chronic back pain.   LOSARTAN (COZAAR) 25 MG TABLET    Take 0.5 tablets (12.5 mg total) by mouth daily.   MEMANTINE (NAMENDA) 10 MG TABLET    Take 10 mg by mouth 2 (two) times daily.   MENTHOL, TOPICAL ANALGESIC, (BIOFREEZE) 4 % GEL    Apply 1 application topically daily as needed (Small Amount).   METRONIDAZOLE (METROGEL) 1 % GEL    Apply 1 application topically daily. Special Instructions: Wash and dry face. Wait 15 minutes, then appy a thin layer of medication.Dx: rosacea   MIRTAZAPINE (REMERON) 30 MG TABLET    Take 30 mg by mouth at bedtime.    MULTIPLE VITAMIN (MULTIVITAMIN WITH MINERALS) TABS TABLET    Take 1 tablet by mouth daily.   NON FORMULARY    Diet: _____ Regular, ______ NAS, ___x____Consistent Carbohydrate, _______NPO _____Other   OMEPRAZOLE (PRILOSEC) 40 MG CAPSULE    Take 1 capsule (40 mg total) by mouth 2 (two) times daily.   OXYCODONE (ROXICODONE) 5 MG IMMEDIATE  RELEASE TABLET    Take 1 tablet (5 mg total) by mouth in the morning and at bedtime.   PRIMIDONE (MYSOLINE) 50 MG TABLET    Take 25 mg by mouth at bedtime.   ZOLPIDEM (AMBIEN) 5 MG TABLET    Take 1 tablet (5 mg total) by mouth at bedtime as needed for up to 14 days for sleep.  Modified Medications   No medications on file  Discontinued Medications   No medications on file     SIGNIFICANT DIAGNOSTIC EXAMS  PREVIOUS   03-19-20: chest x-ray:  No active disease. Chronic elevation of right hemidiaphragm.  03-19-20: ct of abdomen and pelvis:  1. Moderate to severe small bowel dilatation is noted concerning for distal small bowel obstruction. Definite transition zone is not identified, although most likely is in the pelvis as there is nondilated ileum in this area. Aortic Atherosclerosis   03-21-20: KUB 1. Persistent small bowel dilatation, with no significant transit of oral contrast compatible with small-bowel obstruction.  NO NEW EXAMS.    LABS REVIEWED: PREVIOUS   03-19-20: wbc 11.8; hgb 12.0; hct 38.8; mcv 81.3 plt 273; glucose 137; bun 36; creat 1.65; k+ 4.0; na++ 136; ca 9.4 liver normal albumin 4.0; hgb a1c 6.2 03-23-20: wbc 7.7; hgb 9.9; hct 31.6; mcv 81.7 plt 216; glucose 128; bun 22; creat 0.92; k+ 3.8; na++ 138 ca 8,3 mag 1.7 phos 2.5  04-03-20: hgb 11.0; hct 35.5; glucose 119; bun 15; creat 0.90; k+ 2.8; na++ 140; ca 9.0   NO NEW LABS.   Review of Systems  Constitutional: Negative for malaise/fatigue.  Respiratory: Negative for cough and shortness of breath.   Cardiovascular: Negative for chest pain, palpitations and leg swelling.  Gastrointestinal: Negative for abdominal pain, constipation and heartburn.  Musculoskeletal: Negative for back pain, joint pain and myalgias.  Skin: Negative.   Neurological: Negative for dizziness.  Psychiatric/Behavioral: The patient is not nervous/anxious.  Physical Exam Constitutional:      General: She is not in acute distress.     Appearance: She is well-developed. She is not diaphoretic.  Neck:     Thyroid: No thyromegaly.  Cardiovascular:     Rate and Rhythm: Normal rate and regular rhythm.     Heart sounds: Normal heart sounds.  Pulmonary:     Effort: Pulmonary effort is normal. No respiratory distress.     Breath sounds: Normal breath sounds.  Abdominal:     General: Bowel sounds are normal. There is no distension.     Palpations: Abdomen is soft.     Tenderness: There is no abdominal tenderness.  Musculoskeletal:        General: Normal range of motion.     Cervical back: Neck supple.     Right lower leg: Edema present.     Left lower leg: Edema present.     Comments: Trace   Lymphadenopathy:     Cervical: No cervical adenopathy.  Skin:    General: Skin is warm and dry.  Neurological:     Mental Status: She is alert and oriented to person, place, and time.  Psychiatric:        Mood and Affect: Mood normal.        ASSESSMENT/ PLAN:  Patient is being discharged with the following home health services:  Pt to evaluate and treat as indicated for gait balance strength   Patient is being discharged with the following durable medical equipment:  None needed   Patient has been advised to f/u with their PCP in 1-2 weeks to bring them up to date on their rehab stay.  Social services at facility was responsible for arranging this appointment.  Pt was provided with a 30 day supply of prescriptions for medications and refills must be obtained from their PCP.  For controlled substances, a more limited supply may be provided adequate until PCP appointment only.   Her medications will be provided by the assisted living.   Time spent with patient 35 minutes: home health medications dme.    Ok Edwards NP [facility]  Contact [phone] Monday through Friday 8am- 5pm  After hours call [phone]

## 2020-04-18 ENCOUNTER — Ambulatory Visit: Payer: Medicare Other | Admitting: Orthopaedic Surgery

## 2020-04-18 DIAGNOSIS — N189 Chronic kidney disease, unspecified: Secondary | ICD-10-CM | POA: Diagnosis not present

## 2020-04-18 DIAGNOSIS — E119 Type 2 diabetes mellitus without complications: Secondary | ICD-10-CM | POA: Diagnosis not present

## 2020-04-20 DIAGNOSIS — Z1389 Encounter for screening for other disorder: Secondary | ICD-10-CM | POA: Diagnosis not present

## 2020-04-20 DIAGNOSIS — K219 Gastro-esophageal reflux disease without esophagitis: Secondary | ICD-10-CM | POA: Diagnosis not present

## 2020-04-20 DIAGNOSIS — E7849 Other hyperlipidemia: Secondary | ICD-10-CM | POA: Diagnosis not present

## 2020-04-20 DIAGNOSIS — M549 Dorsalgia, unspecified: Secondary | ICD-10-CM | POA: Diagnosis not present

## 2020-04-20 DIAGNOSIS — L718 Other rosacea: Secondary | ICD-10-CM | POA: Diagnosis not present

## 2020-04-20 DIAGNOSIS — E559 Vitamin D deficiency, unspecified: Secondary | ICD-10-CM | POA: Diagnosis not present

## 2020-04-20 DIAGNOSIS — M1991 Primary osteoarthritis, unspecified site: Secondary | ICD-10-CM | POA: Diagnosis not present

## 2020-04-20 DIAGNOSIS — L03211 Cellulitis of face: Secondary | ICD-10-CM | POA: Diagnosis not present

## 2020-04-20 DIAGNOSIS — E039 Hypothyroidism, unspecified: Secondary | ICD-10-CM | POA: Diagnosis not present

## 2020-04-20 DIAGNOSIS — E1129 Type 2 diabetes mellitus with other diabetic kidney complication: Secondary | ICD-10-CM | POA: Diagnosis not present

## 2020-04-20 DIAGNOSIS — E539 Vitamin B deficiency, unspecified: Secondary | ICD-10-CM | POA: Diagnosis not present

## 2020-04-20 DIAGNOSIS — I10 Essential (primary) hypertension: Secondary | ICD-10-CM | POA: Diagnosis not present

## 2020-04-20 DIAGNOSIS — Z0000 Encounter for general adult medical examination without abnormal findings: Secondary | ICD-10-CM | POA: Diagnosis not present

## 2020-04-25 DIAGNOSIS — U071 COVID-19: Secondary | ICD-10-CM | POA: Diagnosis not present

## 2020-04-25 DIAGNOSIS — E119 Type 2 diabetes mellitus without complications: Secondary | ICD-10-CM | POA: Diagnosis not present

## 2020-04-25 DIAGNOSIS — N189 Chronic kidney disease, unspecified: Secondary | ICD-10-CM | POA: Diagnosis not present

## 2020-04-26 DIAGNOSIS — U071 COVID-19: Secondary | ICD-10-CM | POA: Diagnosis not present

## 2020-04-27 DIAGNOSIS — U071 COVID-19: Secondary | ICD-10-CM | POA: Diagnosis not present

## 2020-04-27 DIAGNOSIS — E119 Type 2 diabetes mellitus without complications: Secondary | ICD-10-CM | POA: Diagnosis not present

## 2020-04-27 DIAGNOSIS — N189 Chronic kidney disease, unspecified: Secondary | ICD-10-CM | POA: Diagnosis not present

## 2020-05-02 DIAGNOSIS — E119 Type 2 diabetes mellitus without complications: Secondary | ICD-10-CM | POA: Diagnosis not present

## 2020-05-02 DIAGNOSIS — N189 Chronic kidney disease, unspecified: Secondary | ICD-10-CM | POA: Diagnosis not present

## 2020-05-02 DIAGNOSIS — U071 COVID-19: Secondary | ICD-10-CM | POA: Diagnosis not present

## 2020-05-04 DIAGNOSIS — U071 COVID-19: Secondary | ICD-10-CM | POA: Diagnosis not present

## 2020-05-05 ENCOUNTER — Encounter: Payer: Self-pay | Admitting: Gastroenterology

## 2020-05-05 ENCOUNTER — Ambulatory Visit (INDEPENDENT_AMBULATORY_CARE_PROVIDER_SITE_OTHER): Payer: Medicare Other | Admitting: Gastroenterology

## 2020-05-05 ENCOUNTER — Other Ambulatory Visit: Payer: Self-pay

## 2020-05-05 ENCOUNTER — Encounter (HOSPITAL_COMMUNITY): Payer: Self-pay

## 2020-05-05 ENCOUNTER — Emergency Department (HOSPITAL_COMMUNITY)
Admission: EM | Admit: 2020-05-05 | Discharge: 2020-05-05 | Disposition: A | Discharge status: Home / Self Care | Payer: Medicare Other | Attending: Emergency Medicine | Admitting: Emergency Medicine

## 2020-05-05 ENCOUNTER — Emergency Department (HOSPITAL_COMMUNITY): Payer: Medicare Other

## 2020-05-05 DIAGNOSIS — K3580 Unspecified acute appendicitis: Secondary | ICD-10-CM | POA: Diagnosis not present

## 2020-05-05 DIAGNOSIS — Z79899 Other long term (current) drug therapy: Secondary | ICD-10-CM | POA: Diagnosis not present

## 2020-05-05 DIAGNOSIS — E1140 Type 2 diabetes mellitus with diabetic neuropathy, unspecified: Secondary | ICD-10-CM | POA: Diagnosis not present

## 2020-05-05 DIAGNOSIS — R1030 Lower abdominal pain, unspecified: Secondary | ICD-10-CM | POA: Diagnosis not present

## 2020-05-05 DIAGNOSIS — Y9389 Activity, other specified: Secondary | ICD-10-CM | POA: Insufficient documentation

## 2020-05-05 DIAGNOSIS — K660 Peritoneal adhesions (postprocedural) (postinfection): Secondary | ICD-10-CM | POA: Diagnosis not present

## 2020-05-05 DIAGNOSIS — Y9269 Other specified industrial and construction area as the place of occurrence of the external cause: Secondary | ICD-10-CM | POA: Insufficient documentation

## 2020-05-05 DIAGNOSIS — F0391 Unspecified dementia with behavioral disturbance: Secondary | ICD-10-CM | POA: Insufficient documentation

## 2020-05-05 DIAGNOSIS — S199XXA Unspecified injury of neck, initial encounter: Secondary | ICD-10-CM | POA: Diagnosis not present

## 2020-05-05 DIAGNOSIS — J323 Chronic sphenoidal sinusitis: Secondary | ICD-10-CM | POA: Diagnosis not present

## 2020-05-05 DIAGNOSIS — Z7951 Long term (current) use of inhaled steroids: Secondary | ICD-10-CM | POA: Diagnosis not present

## 2020-05-05 DIAGNOSIS — I129 Hypertensive chronic kidney disease with stage 1 through stage 4 chronic kidney disease, or unspecified chronic kidney disease: Secondary | ICD-10-CM | POA: Insufficient documentation

## 2020-05-05 DIAGNOSIS — R1031 Right lower quadrant pain: Secondary | ICD-10-CM | POA: Diagnosis not present

## 2020-05-05 DIAGNOSIS — Z87891 Personal history of nicotine dependence: Secondary | ICD-10-CM | POA: Insufficient documentation

## 2020-05-05 DIAGNOSIS — K529 Noninfective gastroenteritis and colitis, unspecified: Secondary | ICD-10-CM | POA: Diagnosis not present

## 2020-05-05 DIAGNOSIS — N183 Chronic kidney disease, stage 3 unspecified: Secondary | ICD-10-CM | POA: Insufficient documentation

## 2020-05-05 DIAGNOSIS — Y998 Other external cause status: Secondary | ICD-10-CM | POA: Insufficient documentation

## 2020-05-05 DIAGNOSIS — I7090 Unspecified atherosclerosis: Secondary | ICD-10-CM | POA: Diagnosis not present

## 2020-05-05 DIAGNOSIS — S0990XA Unspecified injury of head, initial encounter: Secondary | ICD-10-CM

## 2020-05-05 DIAGNOSIS — R109 Unspecified abdominal pain: Secondary | ICD-10-CM | POA: Insufficient documentation

## 2020-05-05 DIAGNOSIS — W010XXA Fall on same level from slipping, tripping and stumbling without subsequent striking against object, initial encounter: Secondary | ICD-10-CM | POA: Diagnosis not present

## 2020-05-05 DIAGNOSIS — M47812 Spondylosis without myelopathy or radiculopathy, cervical region: Secondary | ICD-10-CM | POA: Diagnosis not present

## 2020-05-05 DIAGNOSIS — E1122 Type 2 diabetes mellitus with diabetic chronic kidney disease: Secondary | ICD-10-CM | POA: Insufficient documentation

## 2020-05-05 DIAGNOSIS — R1032 Left lower quadrant pain: Secondary | ICD-10-CM | POA: Diagnosis not present

## 2020-05-05 LAB — CBC
HCT: 33.9 % — ABNORMAL LOW (ref 36.0–46.0)
Hemoglobin: 10 g/dL — ABNORMAL LOW (ref 12.0–15.0)
MCH: 25.4 pg — ABNORMAL LOW (ref 26.0–34.0)
MCHC: 29.5 g/dL — ABNORMAL LOW (ref 30.0–36.0)
MCV: 86 fL (ref 80.0–100.0)
Platelets: 257 10*3/uL (ref 150–400)
RBC: 3.94 MIL/uL (ref 3.87–5.11)
RDW: 18.2 % — ABNORMAL HIGH (ref 11.5–15.5)
WBC: 8.5 10*3/uL (ref 4.0–10.5)
nRBC: 0 % (ref 0.0–0.2)

## 2020-05-05 LAB — URINALYSIS, ROUTINE W REFLEX MICROSCOPIC
Bilirubin Urine: NEGATIVE
Glucose, UA: NEGATIVE mg/dL
Hgb urine dipstick: NEGATIVE
Ketones, ur: NEGATIVE mg/dL
Leukocytes,Ua: NEGATIVE
Nitrite: NEGATIVE
Protein, ur: NEGATIVE mg/dL
Specific Gravity, Urine: 1.023 (ref 1.005–1.030)
pH: 6 (ref 5.0–8.0)

## 2020-05-05 LAB — COMPREHENSIVE METABOLIC PANEL
ALT: 23 U/L (ref 0–44)
AST: 27 U/L (ref 15–41)
Albumin: 3.9 g/dL (ref 3.5–5.0)
Alkaline Phosphatase: 64 U/L (ref 38–126)
Anion gap: 13 (ref 5–15)
BUN: 15 mg/dL (ref 8–23)
CO2: 25 mmol/L (ref 22–32)
Calcium: 9.3 mg/dL (ref 8.9–10.3)
Chloride: 103 mmol/L (ref 98–111)
Creatinine, Ser: 0.85 mg/dL (ref 0.44–1.00)
GFR calc Af Amer: >60 mL/min (ref >60)
GFR calc non Af Amer: >60 mL/min (ref >60)
Glucose, Bld: 128 mg/dL — ABNORMAL HIGH (ref 70–99)
Potassium: 3.6 mmol/L (ref 3.5–5.1)
Sodium: 141 mmol/L (ref 135–145)
Total Bilirubin: 0.6 mg/dL (ref 0.3–1.2)
Total Protein: 7.4 g/dL (ref 6.5–8.1)

## 2020-05-05 LAB — LIPASE, BLOOD: Lipase: 57 U/L — ABNORMAL HIGH (ref 11–51)

## 2020-05-05 MED ORDER — OXYCODONE-ACETAMINOPHEN 5-325 MG PO TABS
1.0000 | ORAL_TABLET | Freq: Once | ORAL | Status: AC
  Administered 2020-05-05: 1 via ORAL
  Filled 2020-05-05: qty 1

## 2020-05-05 MED ORDER — ONDANSETRON HCL 4 MG/2ML IJ SOLN
4.0000 mg | Freq: Once | INTRAMUSCULAR | Status: AC
  Administered 2020-05-05: 4 mg via INTRAVENOUS
  Filled 2020-05-05: qty 2

## 2020-05-05 MED ORDER — ALPRAZOLAM 0.5 MG PO TABS
0.5000 mg | ORAL_TABLET | Freq: Once | ORAL | Status: DC
  Filled 2020-05-05: qty 1

## 2020-05-05 MED ORDER — MORPHINE SULFATE (PF) 2 MG/ML IV SOLN
2.0000 mg | Freq: Once | INTRAVENOUS | Status: AC
  Administered 2020-05-05: 2 mg via INTRAVENOUS
  Filled 2020-05-05: qty 1

## 2020-05-05 MED ORDER — IOHEXOL 300 MG/ML  SOLN
100.0000 mL | Freq: Once | INTRAMUSCULAR | Status: AC | PRN
  Administered 2020-05-05: 100 mL via INTRAVENOUS

## 2020-05-05 NOTE — ED Provider Notes (Signed)
Medical screening examination/treatment/procedure(s) were conducted as a shared visit with non-physician practitioner(s) and myself.  I personally evaluated the patient during the encounter.      Patient seen by me along with physician assistant.  Patient sent in from GI office for a fall that occurred this morning.  Patient was complaining of some abdominal pain.  And had some vomiting and diarrhea that was a reason why she was seeing gastroenterology.  The GI office sent her in because of the fall.  Upon arrival here Cipriano Mile noted that patient had lower quadrant abdominal tenderness ordered a CT scan of the abdomen.  Which shows some evidence of some colitis localized as well as an enlarging appendix.  Based on the fact the patient did have lower quad abdominal pain we did contact general surgery Dr. Constance Haw.  She evaluated the CAT scan.  And has opted to have patient return back to nursing home and there got a new CBC over the weekend and she will see her in the office.  She did not feel that she required admission.  Patient is a difficult historian.   Fredia Sorrow, MD 05/05/20 1728

## 2020-05-05 NOTE — Progress Notes (Signed)
[facility]  Spoke with Evalee Jefferson PA regarding patient. 2 week history of pain, some findings for possible colitis but no diarrhea. Lower abdomen pain. Appendix still <1 cm. No wbc and no fever. I do not think this appendicitis and likely resolving colitis or early. Post op ~6 weeks could also be related.  Will see Tuesday. Asked SNF to obtain CBC over weekend and return if worsening symptoms. Again ve ry unlikely appendicitis given above. Would not give antibiotics now so not to mask evolution of findings.  Curlene Labrum, MD

## 2020-05-05 NOTE — Progress Notes (Signed)
worse. No pain after eating. Not passing gas. No nausea. No constipation. +GERD. Omeprazole BID.       Referring Provider: [name], MD Primary Care Physician:  [name], MD Primary GI: Dr. Gala Romney   Chief Complaint  Patient presents with  . Abdominal Pain    lower abd    HPI:   Laurie Mcfarland is an 85 y.o. female presenting today with a history of IDA, hospitalization for acute blood loss anemia and melena Nov 2020 s/p EGD with PUD, +H.pylori serologies s/p treatment. Surveillance EGD March 2021. Negative H.pylori on path. Inpatient July 2021 with distal SBO and undergoing exploratory laparotomy and lysis of adhesions.    Fell on back this morning while trying to get a pad from the cabinet, unwitnessed, got up on own right after. Hit back of head as well. No headache. Unknown year.  Knows she is in [place]. Per staff, they believe she does have memory issues. Somewhat difficult historian. Let staff know she fell. Caretaker this morning states she spoke with her director who told her to bring patient to GI regularly scheduled appt. She has only been at [facility] for a few weeks, previously at [facility]  Lower abdomen hurting for "quite awhile". Unable to quantify how long. Hurts continuously. With breathing hurts. States no flatus. Denies constipation, diarrhea, overt GI bleeding. No N/V. Obviously uncomfortable and appears pale on exam. Short, shallow breaths.   Past Medical History:  Diagnosis Date  . Anxiety   . Arthritis   . Cataracts, bilateral   . Depression   . Diabetes mellitus   . Diabetic peripheral neuropathy ([place])   . Fibromyalgia   . GERD (gastroesophageal reflux disease)   . Hyperlipidemia   . Hypertension   . Leg swelling   . Shortness of breath    with exertion  . Weight loss   . Wheezing     Past Surgical History:  Procedure Laterality Date  . ABDOMINAL HYSTERECTOMY    . CATARACT EXTRACTION W/PHACO  12/10/2011   Procedure: CATARACT  EXTRACTION PHACO AND INTRAOCULAR LENS PLACEMENT ([facility]);  Surgeon: Elta Guadeloupe T. Gershon Crane, MD;  Location: [facility];  Service: Ophthalmology;  Laterality: Left;  CDE=10.72  . CATARACT EXTRACTION W/PHACO  01/14/2012   Procedure: CATARACT EXTRACTION PHACO AND INTRAOCULAR LENS PLACEMENT ([facility]);  Surgeon: Elta Guadeloupe T. Gershon Crane, MD;  Location: [facility];  Service: Ophthalmology;  Laterality: Right;  CDE:11.63  . ESOPHAGOGASTRODUODENOSCOPY N/A 07/18/2019   multiple gastric and duodenal ulcers,mild Schatzki ring in the distal esophagus,irregular Z-line,and LA grade B esophagitis, +H.pylori serologies s/p treatment  . ESOPHAGOGASTRODUODENOSCOPY (EGD) WITH ESOPHAGEAL DILATION N/A 06/17/2013   BJY:NWGNFA esophagus-status post Venia Minks dilation. Hiatal hernia. Gastric erosions s/p bx  . ESOPHAGOGASTRODUODENOSCOPY (EGD) WITH PROPOFOL N/A 11/25/2019   Normal esophagus, erosive gastropathy with some crpe appearance much better than previous EGD, superficial erosions/ulcerations small and scattered in the posterior duodenal bulb and second portion of the duodenum s/p biopsies of gastric and duodenal mucosa.  Pathology with peptic duodenitis with ulceration, chronic active gastritis without H. Pylori.  . foreign body removal     splinter removed from right side of face  . LAPAROTOMY N/A 03/22/2020   Procedure: EXPLORATORY LAPAROTOMY;  Surgeon: [name], MD;  Location: [facility];  Service: General;  Laterality: N/A;  . LYSIS OF ADHESION N/A 03/22/2020   Procedure: LYSIS OF ADHESIONS;  Surgeon: [name], MD;  Location: [facility];  Service: General;  Laterality: N/A;    Current Outpatient Medications  Medication Sig Dispense  Refill  . acetaminophen (TYLENOL) 650 MG CR tablet Take 650 mg by mouth 3 (three) times daily.    Marland Kitchen albuterol (VENTOLIN HFA) 108 (90 Base) MCG/ACT inhaler Inhale 2 puffs into the lungs every 4 (four) hours as needed.     . ALPRAZolam (XANAX) 0.5 MG tablet Take 1 tablet (0.5 mg total) by mouth 3 (three) times  daily as needed. 15 tablet 0  . atorvastatin (LIPITOR) 20 MG tablet Take 1 tablet (20 mg total) by mouth daily.    . busPIRone (BUSPAR) 10 MG tablet Take 10 mg by mouth 2 (two) times daily.    . Cholecalciferol (VITAMIN D-3) 1000 UNITS CAPS Take 1 capsule by mouth 2 (two) times daily.     Marland Kitchen doxycycline (MONODOX) 50 MG capsule Take 50 mg by mouth daily.    Marland Kitchen gabapentin (NEURONTIN) 300 MG capsule Take 1 capsule (300 mg total) by mouth 3 (three) times daily.    . Lidocaine HCl 4 % PTCH Apply 1 patch topically daily. Apply to lower back daily and remove at [facility] for chronic back pain.    Marland Kitchen losartan (COZAAR) 25 MG tablet Take 0.5 tablets (12.5 mg total) by mouth daily.    . memantine (NAMENDA) 10 MG tablet Take 10 mg by mouth 2 (two) times daily.    . mirtazapine (REMERON) 30 MG tablet Take 30 mg by mouth at bedtime.     . Multiple Vitamin (MULTIVITAMIN WITH MINERALS) TABS tablet Take 1 tablet by mouth daily.    Marland Kitchen omeprazole (PRILOSEC) 40 MG capsule Take 1 capsule (40 mg total) by mouth 2 (two) times daily. 60 capsule 2  . oxyCODONE (ROXICODONE) 5 MG immediate release tablet Take 1 tablet (5 mg total) by mouth in the morning and at bedtime. (Patient taking differently: Take 5 mg by mouth 4 (four) times daily as needed. ) 60 tablet 0  . primidone (MYSOLINE) 50 MG tablet Take 25 mg by mouth at bedtime.    Marland Kitchen zolpidem (AMBIEN) 5 MG tablet Take 1 tablet (5 mg total) by mouth at bedtime as needed for up to 14 days for sleep. 14 tablet 0   No current facility-administered medications for this visit.    Allergies as of 05/05/2020 - Review Complete 05/05/2020  Allergen Reaction Noted  . Penicillins Rash 06/09/2013    Family History  Problem Relation Age of Onset  . Alzheimer's disease Mother   . Cancer Father   . Diabetes Daughter   . High blood pressure Daughter   . High blood pressure Daughter   . Cancer Sister   . Cancer Brother   . Pseudochol deficiency Neg Hx   . Malignant hyperthermia Neg Hx     . Hypotension Neg Hx   . Anesthesia problems Neg Hx   . Colon cancer Neg Hx   . Stomach cancer Neg Hx     Social History   Socioeconomic History  . Marital status: Widowed    Spouse name: Not on file  . Number of children: 4  . Years of education: Not on file  . Highest education level: Not on file  Occupational History  . Not on file  Tobacco Use  . Smoking status: Former Smoker    Quit date: 08/30/1994    Years since quitting: 25.6  . Smokeless tobacco: Never Used  Vaping Use  . Vaping Use: Never used  Substance and Sexual Activity  . Alcohol use: No    Alcohol/week: 0.0 standard drinks  . Drug use:  No  . Sexual activity: Not Currently  Other Topics Concern  . Not on file  Social History Narrative   Adopted granddaughter and raised great grandson.   Pt states that her grandson lives with her sometimes,    Social Determinants of Health   Financial Resource Strain: Low Risk   . Difficulty of Paying Living Expenses: Not hard at all  Food Insecurity: No Food Insecurity  . Worried About Charity fundraiser in the Last Year: Never true  . Ran Out of Food in the Last Year: Never true  Transportation Needs: No Transportation Needs  . Lack of Transportation (Medical): No  . Lack of Transportation (Non-Medical): No  Physical Activity: Inactive  . Days of Exercise per Week: 0 days  . Minutes of Exercise per Session: 0 min  Stress: Stress Concern Present  . Feeling of Stress : To some extent  Social Connections: Moderately Integrated  . Frequency of Communication with Friends and Family: More than three times a week  . Frequency of Social Gatherings with Friends and Family: More than three times a week  . Attends Religious Services: More than 4 times per year  . Active Member of Clubs or Organizations: Yes  . Attends Archivist Meetings: More than 4 times per year  . Marital Status: Widowed    Review of Systems: As mentioned in HPI  Physical Exam: BP  (!) 185/85   Pulse 98   Temp (!) 96.8 F (36 C) (Temporal)   Ht 5\' 3"  (1.6 m)   Wt 165 lb 3.2 oz (74.9 kg)   BMI 29.26 kg/m  General:   Alert and oriented to person and place, not year, pale, diaphoretic, appears uncomfortable. Grimacing.  Head:  Normocephalic and atraumatic. Eyes:  Conjuctiva clear without scleral icterus. Mouth:  Mask in place. Abdomen:  +BS, soft, fullness in lower abdomen with TTP LLQ and below umbilicus, no upper abdominal TTP, no guarding. No bruising to flanks or back Msk:  With kyphosis, uses walker Psych:   Flat affect  ASSESSMENT/PLAN: Laurie Mcfarland is an 85 y.o. female presenting today with history of GI bleed last year due to PUD, s/p surveillance EGD March 2021, distal SBO in July 2021 s/p exploratory laparotomy and lysis of adhesions, presenting today for routine follow-up and reporting an unwitnessed fall, falling backwards and hitting back of her head.   Denies any neuro changes, no headache, no obvious bruising on body. She has lower abdominal pain of unknown duration, notably tender on exam, appears pale and diaphoretic. Obviously uncomfortable. Interestingly, caretaker states she informed supervisor who stated to continue with GI appt today. She is not a reliable historian but in obvious discomfort. She has only been at [facility] for several weeks, previously at the [facility], so the staff is unable to tell me her "baseline".  I have asked that patient be taken to the ED. Will need imaging and labs. We will see her in 6-8 weeks for routine visit after this acute issue is addressed. Caretaker is taking patient directly to ED. I have notified charge RN.  Annitta Needs, PhD, ANP-BC [facility]

## 2020-05-05 NOTE — Discharge Instructions (Addendum)
Your CT scan suggests you may have some mild inflammation of your intestines, but this could also be changes seen after recent surgery.  Your lab tests are stable today.  We have discussed your findings with Dr. Constance Haw, our local surgeon who has scheduled an appointment for you in her office Tuesday morning, 05/09/20 at 9:15 am to recheck your symptoms.  If you could get a repeat CBC blood test drawn at your nursing home on Sunday or Monday before your visit - this would be helpful.  In the interim,  return here over the weekend if you develop any worsened pain, or develop fevers, vomiting, diarrhea or any new symptoms.    Additionally, you do not have any internal head or neck injury from this mornings fall.

## 2020-05-05 NOTE — ED Triage Notes (Signed)
Pt to er, pt from [facility], pt states that she fell this morning and got herself up, care taker with pt, states that they took her to her GI doc appointment.  States that she was having some abd pain before her fall this am, states that she saw her gi doc and was told to come to the er to get checked out for the fall and her abd pain. Denies vomiting or diarrhea. Pt states that she thinks that she slipped on some water, states that she thinks that she hit her head.

## 2020-05-05 NOTE — Patient Instructions (Signed)
Please go to the ED. I have let the charge nurse know you are coming.   We will see you in 6-8 weeks for a regular visit and tie up loose ends.  It was a pleasure to see you today. I want to create trusting relationships with patients to provide genuine, compassionate, and quality care. I value your feedback. If you receive a survey regarding your visit,  I greatly appreciate you taking time to fill this out.   Annitta Needs, PhD, ANP-BC [facility]

## 2020-05-05 NOTE — ED Provider Notes (Signed)
[facility] EMERGENCY DEPARTMENT Provider Note   CSN: [id number] Arrival date & time: 05/05/20  1109     History Chief Complaint  Patient presents with  . Fall  . Abdominal Pain    Laurie Mcfarland is a 85 y.o. female with a history of hypertension, diabetes, GERD, stage III kidney disease, also a history of PUD and sbo requiring surgical intervention 2 months ago, presenting for evaluation of low abdominal pain which has been present for "a while, defined as 1-2 weeks, sharp with a "grinding" sensation at times,  worst mid lower abd but radiates right and left lower fields.  She denies diarrhea , constipation, fever or chills, also no n/v.    She was seen by GI this am, a scheduled appt for this pain, but referred here for further evaluation as she also endorses having a fall this morning, fell backward at her nursing home and hit her head on the tile.  She denies LOC, headache, focal weakness or dizziness.  She does report some neck pain since the fall.   HPI     Past Medical History:  Diagnosis Date  . Anxiety   . Arthritis   . Cataracts, bilateral   . Depression   . Diabetes mellitus   . Diabetic peripheral neuropathy ([place])   . Fibromyalgia   . GERD (gastroesophageal reflux disease)   . Hyperlipidemia   . Hypertension   . Leg swelling   . Shortness of breath    with exertion  . Weight loss   . Wheezing     Patient Active Problem List   Diagnosis Date Noted  . Abdominal pain 05/05/2020  . Bilateral lower extremity edema 04/07/2020  . Hyperlipidemia associated with type 2 diabetes mellitus ([place]) 03/29/2020  . Hypertension associated with type 2 diabetes mellitus ([place]) 03/29/2020  . CKD stage 3 due to type 2 diabetes mellitus ([place]) 03/29/2020  . Essential tremor 03/29/2020  . Major depression, recurrent, chronic ([place]) 03/29/2020  . Aortic atherosclerosis ([place]) 03/29/2020  . Chronic bilateral low back pain without sciatica 03/29/2020  . Dementia with behavioral  disturbance ([place])   . Small bowel obstruction ([place]) 03/19/2020  . History of Helicobacter pylori infection 08/19/2019  . Multiple gastric ulcers 08/19/2019  . Multiple duodenal ulcers 08/19/2019  . Abnormal weight loss 08/19/2019  . PUD (peptic ulcer disease)   . Acute blood loss anemia 07/17/2019  . Fibromyalgia 07/17/2019  . Bilateral pulmonary embolism ([place]) 02/16/2018  . Diabetic peripheral neuropathy ([place]) 02/16/2018  . Syncope 06/29/2016  . Renal insufficiency 06/29/2016  . Anemia 06/29/2016  . Edema   . Mild dementia ([place]) 03/01/2015  . Gait instability 03/01/2015  . Frequent falls 03/01/2015  . Memory loss 09/03/2014  . HTN (hypertension) 09/03/2014  . Hyperlipidemia 09/03/2014  . Diabetes type 2, controlled ([place]) 09/03/2014  . Confusion 07/13/2014  . UTI (lower urinary tract infection) 07/13/2014  . Acute encephalopathy 07/13/2014  . Fall at home 07/13/2014  . DM type 2 (diabetes mellitus, type 2) ([place]) 07/13/2014  . Esophageal dysphagia 06/09/2013  . GERD (gastroesophageal reflux disease) 06/09/2013  . Early satiety 06/09/2013    Past Surgical History:  Procedure Laterality Date  . ABDOMINAL HYSTERECTOMY    . CATARACT EXTRACTION W/PHACO  12/10/2011   Procedure: CATARACT EXTRACTION PHACO AND INTRAOCULAR LENS PLACEMENT (IOC);  Surgeon: Elta Guadeloupe T. Gershon Crane, MD;  Location: [facility];  Service: Ophthalmology;  Laterality: Left;  CDE=10.72  . CATARACT EXTRACTION W/PHACO  01/14/2012   Procedure: CATARACT EXTRACTION PHACO  AND INTRAOCULAR LENS PLACEMENT (IOC);  Surgeon: Elta Guadeloupe T. Gershon Crane, MD;  Location: [facility];  Service: Ophthalmology;  Laterality: Right;  CDE:11.63  . ESOPHAGOGASTRODUODENOSCOPY N/A 07/18/2019   multiple gastric and duodenal ulcers,mild Schatzki ring in the distal esophagus,irregular Z-line,and LA grade B esophagitis, +H.pylori serologies s/p treatment  . ESOPHAGOGASTRODUODENOSCOPY (EGD) WITH ESOPHAGEAL DILATION N/A 06/17/2013   TGY:BWLSLH esophagus-status post  Venia Minks dilation. Hiatal hernia. Gastric erosions s/p bx  . ESOPHAGOGASTRODUODENOSCOPY (EGD) WITH PROPOFOL N/A 11/25/2019   Normal esophagus, erosive gastropathy with some crpe appearance much better than previous EGD, superficial erosions/ulcerations small and scattered in the posterior duodenal bulb and second portion of the duodenum s/p biopsies of gastric and duodenal mucosa.  Pathology with peptic duodenitis with ulceration, chronic active gastritis without H. Pylori.  . foreign body removal     splinter removed from right side of face  . LAPAROTOMY N/A 03/22/2020   Procedure: EXPLORATORY LAPAROTOMY;  Surgeon: [name], MD;  Location: [facility];  Service: General;  Laterality: N/A;  . LYSIS OF ADHESION N/A 03/22/2020   Procedure: LYSIS OF ADHESIONS;  Surgeon: [name], MD;  Location: [facility];  Service: General;  Laterality: N/A;     OB History   No obstetric history on file.     Family History  Problem Relation Age of Onset  . Alzheimer's disease Mother   . Cancer Father   . Diabetes Daughter   . High blood pressure Daughter   . High blood pressure Daughter   . Cancer Sister   . Cancer Brother   . Pseudochol deficiency Neg Hx   . Malignant hyperthermia Neg Hx   . Hypotension Neg Hx   . Anesthesia problems Neg Hx   . Colon cancer Neg Hx   . Stomach cancer Neg Hx     Social History   Tobacco Use  . Smoking status: Former Smoker    Quit date: 08/30/1994    Years since quitting: 25.6  . Smokeless tobacco: Never Used  Vaping Use  . Vaping Use: Never used  Substance Use Topics  . Alcohol use: No    Alcohol/week: 0.0 standard drinks  . Drug use: No    Home Medications Prior to Admission medications   Medication Sig Start Date End Date Taking? Authorizing Provider  acetaminophen (TYLENOL) 650 MG CR tablet Take 650 mg by mouth 3 (three) times daily.    [provider]  albuterol (VENTOLIN HFA) 108 (90 Base) MCG/ACT inhaler Inhale 2 puffs into the lungs  every 4 (four) hours as needed.  03/28/20   [provider]  ALPRAZolam Duanne Moron) 0.5 MG tablet Take 1 tablet (0.5 mg total) by mouth 3 (three) times daily as needed. 03/28/20   Barton Dubois, MD  atorvastatin (LIPITOR) 20 MG tablet Take 1 tablet (20 mg total) by mouth daily. 03/29/20   Barton Dubois, MD  busPIRone (BUSPAR) 10 MG tablet Take 10 mg by mouth 2 (two) times daily. 03/17/20   [provider]  Cholecalciferol (VITAMIN D-3) 1000 UNITS CAPS Take 1 capsule by mouth 2 (two) times daily.     [provider]  doxycycline (MONODOX) 50 MG capsule Take 50 mg by mouth daily.    [provider]  gabapentin (NEURONTIN) 300 MG capsule Take 1 capsule (300 mg total) by mouth 3 (three) times daily. 03/28/20   Barton Dubois, MD  Lidocaine HCl 4 % PTCH Apply 1 patch topically daily. Apply to lower back daily and remove at [facility] for chronic back pain. 03/29/20  [provider]  losartan (COZAAR) 25 MG tablet Take 0.5 tablets (12.5 mg total) by mouth daily. 03/28/20   Barton Dubois, MD  memantine (NAMENDA) 10 MG tablet Take 10 mg by mouth 2 (two) times daily. 03/14/20   [provider]  mirtazapine (REMERON) 30 MG tablet Take 30 mg by mouth at bedtime.  01/14/18   [provider]  Multiple Vitamin (MULTIVITAMIN WITH MINERALS) TABS tablet Take 1 tablet by mouth daily.    [provider]  omeprazole (PRILOSEC) 40 MG capsule Take 1 capsule (40 mg total) by mouth 2 (two) times daily. 07/20/19   Barton Dubois, MD  oxyCODONE (ROXICODONE) 5 MG immediate release tablet Take 1 tablet (5 mg total) by mouth in the morning and at bedtime. Patient taking differently: Take 5 mg by mouth 4 (four) times daily as needed.  04/06/20   Gerlene Fee, NP  primidone (MYSOLINE) 50 MG tablet Take 25 mg by mouth at bedtime. 06/21/19   [provider]  zolpidem (AMBIEN) 5 MG tablet Take 1 tablet (5 mg total) by mouth at bedtime as needed for up to 14 days for  sleep. 04/03/20 05/05/20  Gerlene Fee, NP    Allergies    Penicillins  Review of Systems   Review of Systems  Constitutional: Negative for chills and fever.  HENT: Negative.   Eyes: Negative.   Respiratory: Negative for chest tightness and shortness of breath.   Cardiovascular: Negative for chest pain.  Gastrointestinal: Positive for abdominal pain. Negative for constipation, diarrhea, nausea and vomiting.  Genitourinary: Negative.  Negative for dysuria.  Musculoskeletal: Negative for arthralgias, joint swelling and neck pain.  Skin: Negative.  Negative for rash and wound.  Neurological: Negative for dizziness, weakness, light-headedness, numbness and headaches.  Psychiatric/Behavioral: Negative.     Physical Exam Updated Vital Signs BP (!) 192/96   Pulse (!) 110   Temp (!) 97.5 F (36.4 C) (Oral)   Resp 12   Ht 5\' 3"  (1.6 m)   Wt 74.8 kg   SpO2 94%   BMI 29.23 kg/m   Physical Exam Vitals and nursing note reviewed.  Constitutional:      Appearance: She is well-developed.  HENT:     Head: Normocephalic and atraumatic.  Eyes:     Conjunctiva/sclera: Conjunctivae normal.  Cardiovascular:     Rate and Rhythm: Normal rate and regular rhythm.     Heart sounds: Normal heart sounds.  Pulmonary:     Effort: Pulmonary effort is normal.     Breath sounds: Normal breath sounds. No wheezing.  Abdominal:     General: Bowel sounds are normal.     Palpations: Abdomen is soft.     Tenderness: There is abdominal tenderness in the right lower quadrant, suprapubic area and left lower quadrant. There is no guarding or rebound.  Musculoskeletal:        General: Normal range of motion.     Cervical back: Normal range of motion.  Skin:    General: Skin is warm and dry.  Neurological:     Mental Status: She is alert.     ED Results / Procedures / Treatments   Labs (all labs ordered are listed, but only abnormal results are displayed) Labs Reviewed  LIPASE, BLOOD - Abnormal;  Notable for the following components:      Result Value   Lipase 57 (*)    All other components within normal limits  COMPREHENSIVE METABOLIC PANEL - Abnormal; Notable for the following  components:   Glucose, Bld 128 (*)    All other components within normal limits  CBC - Abnormal; Notable for the following components:   Hemoglobin 10.0 (*)    HCT 33.9 (*)    MCH 25.4 (*)    MCHC 29.5 (*)    RDW 18.2 (*)    All other components within normal limits  URINALYSIS, ROUTINE W REFLEX MICROSCOPIC - Abnormal; Notable for the following components:   Color, Urine STRAW (*)    All other components within normal limits    EKG None  Radiology CT Head Wo Contrast  Result Date: 05/05/2020 CLINICAL DATA:  Head trauma, minor. Neck trauma. Additional history provided: Fall. EXAM: CT HEAD WITHOUT CONTRAST CT CERVICAL SPINE WITHOUT CONTRAST TECHNIQUE: Multidetector CT imaging of the head and cervical spine was performed following the standard protocol without intravenous contrast. Multiplanar CT image reconstructions of the cervical spine were also generated. COMPARISON:  Prior head CT examinations 07/17/2019 and earlier. CT cervical spine 06/29/2016. FINDINGS: CT HEAD FINDINGS Brain: Stable, moderate generalized parenchymal atrophy. Stable minimal ill-defined hypoattenuation within the cerebral white matter which is nonspecific, but consistent with chronic small vessel ischemic disease. There is no acute intracranial hemorrhage. No demarcated cortical infarct. No extra-axial fluid collection. No evidence of intracranial mass. No midline shift. Vascular: No hyperdense vessel.  Atherosclerotic calcifications. Skull: Normal. Negative for fracture or focal lesion. Sinuses/Orbits: Visualized orbits show no acute finding. Frothy secretions within the left sphenoid sinus. No significant mastoid effusion. CT CERVICAL SPINE FINDINGS Alignment: Cervical dextrocurvature. Straightening of the expected cervical lordosis. No  significant spondylolisthesis. Skull base and vertebrae: The basion-dental and atlanto-dental intervals are maintained.No evidence of acute fracture to the cervical spine. Soft tissues and spinal canal: No prevertebral fluid or swelling. No visible canal hematoma. Disc levels: Cervical spondylosis with multilevel disc space narrowing, posterior disc osteophytes and uncovertebral hypertrophy. No high-grade bony spinal canal stenosis. Multilevel bony neural foraminal narrowing greatest at C3-C4, C4-C5 and C5-C6. Upper chest: No consolidation within the imaged lung apices. No visible pneumothorax. IMPRESSION: CT head: 1. No evidence of acute intracranial abnormality. 2. Stable moderate generalized parenchymal atrophy and mild chronic small vessel ischemic disease. 3. Left sphenoid sinusitis. CT cervical spine: 1. No evidence of acute fracture to the cervical spine. 2. Cervical dextrocurvature, which may be positional at the time of examination. 3. Cervical spondylosis as described. Electronically Signed   By: Kellie Simmering DO   On: 05/05/2020 15:32   CT Cervical Spine Wo Contrast  Result Date: 05/05/2020 CLINICAL DATA:  Head trauma, minor. Neck trauma. Additional history provided: Fall. EXAM: CT HEAD WITHOUT CONTRAST CT CERVICAL SPINE WITHOUT CONTRAST TECHNIQUE: Multidetector CT imaging of the head and cervical spine was performed following the standard protocol without intravenous contrast. Multiplanar CT image reconstructions of the cervical spine were also generated. COMPARISON:  Prior head CT examinations 07/17/2019 and earlier. CT cervical spine 06/29/2016. FINDINGS: CT HEAD FINDINGS Brain: Stable, moderate generalized parenchymal atrophy. Stable minimal ill-defined hypoattenuation within the cerebral white matter which is nonspecific, but consistent with chronic small vessel ischemic disease. There is no acute intracranial hemorrhage. No demarcated cortical infarct. No extra-axial fluid collection. No evidence  of intracranial mass. No midline shift. Vascular: No hyperdense vessel.  Atherosclerotic calcifications. Skull: Normal. Negative for fracture or focal lesion. Sinuses/Orbits: Visualized orbits show no acute finding. Frothy secretions within the left sphenoid sinus. No significant mastoid effusion. CT CERVICAL SPINE FINDINGS Alignment: Cervical dextrocurvature. Straightening of the expected cervical lordosis. No significant spondylolisthesis. Skull  base and vertebrae: The basion-dental and atlanto-dental intervals are maintained.No evidence of acute fracture to the cervical spine. Soft tissues and spinal canal: No prevertebral fluid or swelling. No visible canal hematoma. Disc levels: Cervical spondylosis with multilevel disc space narrowing, posterior disc osteophytes and uncovertebral hypertrophy. No high-grade bony spinal canal stenosis. Multilevel bony neural foraminal narrowing greatest at C3-C4, C4-C5 and C5-C6. Upper chest: No consolidation within the imaged lung apices. No visible pneumothorax. IMPRESSION: CT head: 1. No evidence of acute intracranial abnormality. 2. Stable moderate generalized parenchymal atrophy and mild chronic small vessel ischemic disease. 3. Left sphenoid sinusitis. CT cervical spine: 1. No evidence of acute fracture to the cervical spine. 2. Cervical dextrocurvature, which may be positional at the time of examination. 3. Cervical spondylosis as described. Electronically Signed   By: Kellie Simmering DO   On: 05/05/2020 15:32   CT ABDOMEN PELVIS W CONTRAST  Result Date: 05/05/2020 CLINICAL DATA:  Abdominal distension, bowel obstruction suspected EXAM: CT ABDOMEN AND PELVIS WITH CONTRAST TECHNIQUE: Multidetector CT imaging of the abdomen and pelvis was performed using the standard protocol following bolus administration of intravenous contrast. CONTRAST:  122mL OMNIPAQUE IOHEXOL 300 MG/ML  SOLN COMPARISON:  March 19, 2020 FINDINGS: Lower chest: Stable elevation of the RIGHT hemidiaphragm.  No consolidation. No pleural effusion. Calcified atheromatous plaque in the distal thoracic aorta. Calcified coronary artery disease. No pericardial effusion. Hepatobiliary: Stable low-density lesions in the liver, likely small cysts. No biliary duct dilation. No pericholecystic stranding. Pancreas: Pancreas without peripancreatic stranding, ductal dilation or focal lesion. Spleen: Spleen normal in size and contour. Adrenals/Urinary Tract: Adrenal glands are normal. Symmetric renal enhancement with signs of renal cortical scarring. No hydronephrosis. Urinary bladder is unremarkable. Stomach/Bowel: Mild perienteric stranding in the RIGHT lower quadrant. Mild thickening of the appendix approximately 6 mm. Some mild periappendiceal stranding. No focal fluid collection. Increased colonic vascularity and colonic thickening perhaps through the hepatic flexure. Question pancolitis though to a lesser extent in the distal than in the proximal colon and with difficult assessment due to under distension. Vascular/Lymphatic: Calcified and noncalcified atheromatous plaque of the abdominal aorta. No aneurysmal dilation. No adenopathy. No pelvic lymphadenopathy. Reproductive: Post hysterectomy. Other: Midline stranding in the anterior abdominal wall following exploratory laparotomy for a he is a lysis. No fluid in this area. Musculoskeletal: No acute musculoskeletal process. No destructive bone finding. IMPRESSION: 1. Mild thickening of the appendix represents a change compared to the prior study but measures only approximately 6 mm. Mild periappendiceal stranding is also noted but is also seen in the context of potential colitis is of uncertain significance. Findings may be reactive. Very early appendicitis could also potentially have this appearance. Correlate with any clinical signs of RIGHT lower quadrant pain, close clinical follow-up may be warranted. 2. Midline stranding in the anterior abdominal wall following exploratory  laparotomy for lysis of adhesions. No fluid in this area. 3. Aortic atherosclerosis. Aortic Atherosclerosis (ICD10-I70.0). Electronically Signed   By: Zetta Bills M.D.   On: 05/05/2020 15:49    Procedures Procedures (including critical care time)  Medications Ordered in ED Medications  morphine 2 MG/ML injection 2 mg (2 mg Intravenous Given 05/05/20 1422)  ondansetron (ZOFRAN) injection 4 mg (4 mg Intravenous Given 05/05/20 1420)  iohexol (OMNIPAQUE) 300 MG/ML solution 100 mL (100 mLs Intravenous Contrast Given 05/05/20 1452)  oxyCODONE-acetaminophen (PERCOCET/ROXICET) 5-325 MG per tablet 1 tablet (1 tablet Oral Given 05/05/20 1749)    ED Course  I have reviewed the triage vital signs and the  nursing notes.  Pertinent labs & imaging results that were available during my care of the patient were reviewed by me and considered in my medical decision making (see chart for details).    MDM Rules/Calculators/A&P                          Labs and imaging reviewed and discussed with patient.  She endorses her symptoms have been present for 1 to 2 weeks.  Discussed these findings with Dr. Constance Haw of general surgery who reviewed patient's CT imaging.  Given her symptoms have been now chronic, doubt that this represents acute appendicitis.  Patient denies having any diarrhea and has a normal WBC count, therefore doubt colitis as well.  CT findings may be reactive or postsurgical findings.  Plan will be to have a recheck in Dr. Constance Haw office on 9/7 in 4 days, ideally with a CBC drawn the day before so she can assess any changes.  Patient understands and agrees with this plan.  However, she was also given strict return precautions to come here over the weekend for recheck for any worsening pain, development of fever, vomiting or diarrhea.  This information was also relayed to her nursing home. Final Clinical Impression(s) / ED Diagnoses Final diagnoses:  Lower abdominal pain  Injury of head, initial  encounter    Rx / DC Orders ED Discharge Orders    None       Landis Martins 05/05/20 1800    Fredia Sorrow, MD 05/05/20 1900

## 2020-05-09 ENCOUNTER — Encounter: Payer: Self-pay | Admitting: General Surgery

## 2020-05-09 ENCOUNTER — Other Ambulatory Visit: Payer: Self-pay

## 2020-05-09 ENCOUNTER — Ambulatory Visit (INDEPENDENT_AMBULATORY_CARE_PROVIDER_SITE_OTHER): Payer: Medicare Other | Admitting: General Surgery

## 2020-05-09 VITALS — BP 139/83 | HR 94 | Temp 97.7°F | Resp 12 | Ht 62.0 in | Wt 164.0 lb

## 2020-05-09 DIAGNOSIS — R1030 Lower abdominal pain, unspecified: Secondary | ICD-10-CM | POA: Diagnosis not present

## 2020-05-09 DIAGNOSIS — U071 COVID-19: Secondary | ICD-10-CM | POA: Diagnosis not present

## 2020-05-09 NOTE — Progress Notes (Signed)
[facility] History and Physical  Reason for Referral: Abdominal Pain/ Rule out Appendicitis?  Referring Physician:  Dr. Rogene Houston, MD (ED)   Chief Complaint    Hospitalization Follow-up      Laurie Mcfarland is a 85 y.o. female.  HPI: Laurie Mcfarland is an 85 yo who has had a recent Ex lap for a SBO July 2021. Laurie Mcfarland has had chronic abdominal pain that Laurie Mcfarland saw GI for 9/3 and at that visit Roseanne Kaufman found out Laurie Mcfarland had a fall that was unwitnessed and Laurie Mcfarland hit Laurie back of her head.  Laurie Mcfarland had no complaints of diarrhea, constipation, or other abdominal issues aside from her chronic 2+ week lower abdominal pain.   Given Laurie fall, Roseanne Kaufman, [place] recommended ED visit to rule out any injury. Laurie ED pan scanned Laurie Mcfarland with CT head, neck, abd/pelvis and no head bleed or spinal injury was noted. Laurie Mcfarland was found to have some reactive changes possible from colitis with an appendix 17mm, and Laurie Mcfarland was called to discuss.  Laurie Mcfarland had a normal WBC, her pain was in Laurie lower abdomen for weeks, Laurie Mcfarland had no clinical evidence of colitis, but had a recent surgery, and was otherwise well. We opted for discharge back to her SNF with precautions to return if worsening pain, fever, nausea/vomiting, and otherwise following up today.  Laurie Mcfarland denies abdominal pain today. Laurie Mcfarland has no fevers. There is no documentation of fevers or worrisome symptom.  Laurie Mcfarland is accompanied by an Aid from [facility].   Past Medical History:  Diagnosis Date  . Anxiety   . Arthritis   . Cataracts, bilateral   . Depression   . Diabetes mellitus   . Diabetic peripheral neuropathy ([place])   . Fibromyalgia   . GERD (gastroesophageal reflux disease)   . Hyperlipidemia   . Hypertension   . Leg swelling   . Shortness of breath    with exertion  . Weight loss   . Wheezing     Past Surgical History:  Procedure Laterality Date  . ABDOMINAL HYSTERECTOMY    . CATARACT EXTRACTION W/PHACO  12/10/2011   Procedure:  CATARACT EXTRACTION PHACO AND INTRAOCULAR LENS PLACEMENT (IOC);  Surgeon: Elta Guadeloupe T. Gershon Crane, MD;  Location: [facility];  Service: Ophthalmology;  Laterality: Left;  CDE=10.72  . CATARACT EXTRACTION W/PHACO  01/14/2012   Procedure: CATARACT EXTRACTION PHACO AND INTRAOCULAR LENS PLACEMENT (IOC);  Surgeon: Elta Guadeloupe T. Gershon Crane, MD;  Location: [facility];  Service: Ophthalmology;  Laterality: Right;  CDE:11.63  . ESOPHAGOGASTRODUODENOSCOPY N/A 07/18/2019   multiple gastric and duodenal ulcers,mild Schatzki ring in Laurie distal esophagus,irregular Z-line,and LA grade B esophagitis, +H.pylori serologies s/p treatment  . ESOPHAGOGASTRODUODENOSCOPY (EGD) WITH ESOPHAGEAL DILATION N/A 06/17/2013   TKZ:SWFUXN esophagus-status post Venia Minks dilation. Hiatal hernia. Gastric erosions s/p bx  . ESOPHAGOGASTRODUODENOSCOPY (EGD) WITH PROPOFOL N/A 11/25/2019   Normal esophagus, erosive gastropathy with some crpe appearance much better than previous EGD, superficial erosions/ulcerations small and scattered in Laurie posterior duodenal bulb and second portion of Laurie duodenum s/p biopsies of gastric and duodenal mucosa.  Pathology with peptic duodenitis with ulceration, chronic active gastritis without H. Pylori.  . foreign body removal     splinter removed from right side of face  . LAPAROTOMY N/A 03/22/2020   Procedure: EXPLORATORY LAPAROTOMY;  Surgeon: [name], MD;  Location: [facility];  Service: General;  Laterality: N/A;  . LYSIS OF ADHESION N/A 03/22/2020   Procedure: LYSIS OF ADHESIONS;  Surgeon: [name], MD;  Location: [facility];  Service: General;  Laterality: N/A;    Family History  Problem Relation Age of Onset  . Alzheimer's disease Mother   . Cancer Father   . Diabetes Daughter   . High blood pressure Daughter   . High blood pressure Daughter   . Cancer Sister   . Cancer Brother   . Pseudochol deficiency Neg Hx   . Malignant hyperthermia Neg Hx   . Hypotension Neg Hx   . Anesthesia problems Neg Hx   . Colon  cancer Neg Hx   . Stomach cancer Neg Hx     Social History   Tobacco Use  . Smoking status: Former Smoker    Quit date: 08/30/1994    Years since quitting: 25.7  . Smokeless tobacco: Never Used  Vaping Use  . Vaping Use: Never used  Substance Use Topics  . Alcohol use: No    Alcohol/week: 0.0 standard drinks  . Drug use: No    Medications: Laurie Mcfarland have reviewed Laurie Mcfarland's current medications. Allergies as of 05/09/2020      Reactions   Penicillins Rash   Has Mcfarland had a PCN reaction causing immediate rash, facial/tongue/throat swelling, SOB or lightheadedness with hypotension: No Has Mcfarland had a PCN reaction causing severe rash involving mucus membranes or skin necrosis: No Has Mcfarland had a PCN reaction that required hospitalization: No Has Mcfarland had a PCN reaction occurring within Laurie last 10 years: No If all of Laurie above answers are "NO", then may proceed with Cephalosporin use.      Medication List       Accurate as of May 09, 2020  9:58 AM. If you have any questions, ask your nurse or doctor.        STOP taking these medications   zolpidem 5 MG tablet Commonly known as: AMBIEN Stopped by: Virl Cagey, MD     TAKE these medications   acetaminophen 650 MG CR tablet Commonly known as: TYLENOL Take 650 mg by mouth 3 (three) times daily.   albuterol 108 (90 Base) MCG/ACT inhaler Commonly known as: VENTOLIN HFA Inhale 2 puffs into Laurie lungs every 4 (four) hours as needed.   ALPRAZolam 0.5 MG tablet Commonly known as: XANAX Take 1 tablet (0.5 mg total) by mouth 3 (three) times daily as needed.   atorvastatin 20 MG tablet Commonly known as: LIPITOR Take 1 tablet (20 mg total) by mouth daily.   busPIRone 10 MG tablet Commonly known as: BUSPAR Take 10 mg by mouth 2 (two) times daily.   doxycycline 50 MG capsule Commonly known as: MONODOX Take 50 mg by mouth daily.   gabapentin 300 MG capsule Commonly known as: NEURONTIN Take 1 capsule (300  mg total) by mouth 3 (three) times daily.   Lidocaine HCl 4 % Ptch Apply 1 patch topically daily. Apply to lower back daily and remove at [facility] for chronic back pain.   losartan 25 MG tablet Commonly known as: COZAAR Take 0.5 tablets (12.5 mg total) by mouth daily.   memantine 10 MG tablet Commonly known as: NAMENDA Take 10 mg by mouth 2 (two) times daily.   mirtazapine 30 MG tablet Commonly known as: REMERON Take 30 mg by mouth at bedtime.   multivitamin with minerals Tabs tablet Take 1 tablet by mouth daily.   omeprazole 40 MG capsule Commonly known as: PRILOSEC Take 1 capsule (40 mg total) by mouth 2 (two) times daily.   oxyCODONE 5 MG immediate release tablet Commonly known  as: Roxicodone Take 1 tablet (5 mg total) by mouth in Laurie morning and at bedtime. What changed:   when to take this  reasons to take this   primidone 50 MG tablet Commonly known as: MYSOLINE Take 25 mg by mouth at bedtime.   Vitamin D-3 25 MCG (1000 UT) Caps Take 1 capsule by mouth 2 (two) times daily.        ROS:  A comprehensive review of systems was negative except for: Musculoskeletal: positive for arthritis pain in Laurie extremities  Blood pressure 139/83, pulse 94, temperature 97.7 F (36.5 C), temperature source Oral, resp. rate 12, height 5\' 2"  (1.575 m), weight 164 lb (74.4 kg), SpO2 92 %. Physical Exam Vitals reviewed.  HENT:     Head: Normocephalic.     Nose: Nose normal.  Eyes:     Extraocular Movements: Extraocular movements intact.  Cardiovascular:     Rate and Rhythm: Normal rate.  Pulmonary:     Effort: Pulmonary effort is normal.  Abdominal:     General: There is no distension.     Palpations: Abdomen is soft.     Tenderness: There is no abdominal tenderness.     Comments: Healing midline incision, no erythema or drainage, no hernia  Musculoskeletal:        General: No swelling.  Skin:    General: Skin is warm.  Neurological:     General: No focal deficit  present.     Mental Status: Laurie Mcfarland is alert.  Psychiatric:        Mood and Affect: Mood normal.        Behavior: Behavior normal.     Results: Personally reviewed CT a/p- mildly possible thickened colon on right and appendix that is 4mm, minor stranding, no fluid  Assessment & Plan:  YOULANDA TOMASSETTI is a 85 y.o. female with what is either post operative reactive changes or resolving colitis on Laurie CT. I do not think Laurie Mcfarland has appendicitis as Laurie Mcfarland would have declared herself between now and Friday. Laurie Mcfarland is eating and having Bms, no fevers, and had a normal WBC in Laurie ED.   -Diet and activity as tolerated -Continue to follow up with GI -PRN follow up with surgery  Future Appointments  Date Time Provider [place]  06/26/2020 10:30 AM Erenest Rasher, PA-C [facility]    All questions were answered to Laurie satisfaction of Laurie Mcfarland and [place] [facility].Virl Cagey 05/09/2020, 9:58 AM

## 2020-05-09 NOTE — Patient Instructions (Signed)
-  Diet and activity as tolerated -Continue to follow up with GI -PRN follow up with surgery

## 2020-05-10 DIAGNOSIS — N189 Chronic kidney disease, unspecified: Secondary | ICD-10-CM | POA: Diagnosis not present

## 2020-05-10 DIAGNOSIS — E119 Type 2 diabetes mellitus without complications: Secondary | ICD-10-CM | POA: Diagnosis not present

## 2020-05-11 DIAGNOSIS — U071 COVID-19: Secondary | ICD-10-CM | POA: Diagnosis not present

## 2020-05-12 ENCOUNTER — Emergency Department (HOSPITAL_COMMUNITY): Payer: Medicare Other

## 2020-05-12 ENCOUNTER — Emergency Department (HOSPITAL_COMMUNITY)
Admission: EM | Admit: 2020-05-12 | Discharge: 2020-05-13 | Disposition: A | Discharge status: Skilled Nursing Facility | Payer: Medicare Other | Attending: Emergency Medicine | Admitting: Emergency Medicine

## 2020-05-12 ENCOUNTER — Other Ambulatory Visit: Payer: Self-pay

## 2020-05-12 ENCOUNTER — Encounter (HOSPITAL_COMMUNITY): Payer: Self-pay

## 2020-05-12 DIAGNOSIS — N189 Chronic kidney disease, unspecified: Secondary | ICD-10-CM | POA: Diagnosis not present

## 2020-05-12 DIAGNOSIS — Z87891 Personal history of nicotine dependence: Secondary | ICD-10-CM | POA: Insufficient documentation

## 2020-05-12 DIAGNOSIS — N183 Chronic kidney disease, stage 3 unspecified: Secondary | ICD-10-CM | POA: Diagnosis not present

## 2020-05-12 DIAGNOSIS — U071 COVID-19: Secondary | ICD-10-CM | POA: Insufficient documentation

## 2020-05-12 DIAGNOSIS — Z7951 Long term (current) use of inhaled steroids: Secondary | ICD-10-CM | POA: Diagnosis not present

## 2020-05-12 DIAGNOSIS — R509 Fever, unspecified: Secondary | ICD-10-CM | POA: Diagnosis not present

## 2020-05-12 DIAGNOSIS — G319 Degenerative disease of nervous system, unspecified: Secondary | ICD-10-CM | POA: Diagnosis not present

## 2020-05-12 DIAGNOSIS — R05 Cough: Secondary | ICD-10-CM | POA: Diagnosis not present

## 2020-05-12 DIAGNOSIS — E119 Type 2 diabetes mellitus without complications: Secondary | ICD-10-CM | POA: Diagnosis not present

## 2020-05-12 DIAGNOSIS — J323 Chronic sphenoidal sinusitis: Secondary | ICD-10-CM | POA: Diagnosis not present

## 2020-05-12 DIAGNOSIS — Z7984 Long term (current) use of oral hypoglycemic drugs: Secondary | ICD-10-CM | POA: Insufficient documentation

## 2020-05-12 DIAGNOSIS — R519 Headache, unspecified: Secondary | ICD-10-CM

## 2020-05-12 DIAGNOSIS — J0130 Acute sphenoidal sinusitis, unspecified: Secondary | ICD-10-CM | POA: Diagnosis not present

## 2020-05-12 DIAGNOSIS — I129 Hypertensive chronic kidney disease with stage 1 through stage 4 chronic kidney disease, or unspecified chronic kidney disease: Secondary | ICD-10-CM | POA: Insufficient documentation

## 2020-05-12 DIAGNOSIS — Z79899 Other long term (current) drug therapy: Secondary | ICD-10-CM | POA: Diagnosis not present

## 2020-05-12 DIAGNOSIS — I6782 Cerebral ischemia: Secondary | ICD-10-CM | POA: Diagnosis not present

## 2020-05-12 DIAGNOSIS — G894 Chronic pain syndrome: Secondary | ICD-10-CM | POA: Diagnosis not present

## 2020-05-12 DIAGNOSIS — E118 Type 2 diabetes mellitus with unspecified complications: Secondary | ICD-10-CM | POA: Diagnosis not present

## 2020-05-12 LAB — CBC WITH DIFFERENTIAL/PLATELET
Abs Immature Granulocytes: 0 10*3/uL (ref 0.00–0.07)
Basophils Absolute: 0 10*3/uL (ref 0.0–0.1)
Basophils Relative: 0 %
Eosinophils Absolute: 0 10*3/uL (ref 0.0–0.5)
Eosinophils Relative: 1 %
HCT: 33.8 % — ABNORMAL LOW (ref 36.0–46.0)
Hemoglobin: 9.9 g/dL — ABNORMAL LOW (ref 12.0–15.0)
Immature Granulocytes: 0 %
Lymphocytes Relative: 55 %
Lymphs Abs: 2 10*3/uL (ref 0.7–4.0)
MCH: 25.4 pg — ABNORMAL LOW (ref 26.0–34.0)
MCHC: 29.3 g/dL — ABNORMAL LOW (ref 30.0–36.0)
MCV: 86.9 fL (ref 80.0–100.0)
Monocytes Absolute: 0.4 10*3/uL (ref 0.1–1.0)
Monocytes Relative: 10 %
Neutro Abs: 1.2 10*3/uL — ABNORMAL LOW (ref 1.7–7.7)
Neutrophils Relative %: 34 %
Platelets: 210 10*3/uL (ref 150–400)
RBC: 3.89 MIL/uL (ref 3.87–5.11)
RDW: 18.4 % — ABNORMAL HIGH (ref 11.5–15.5)
WBC: 3.6 10*3/uL — ABNORMAL LOW (ref 4.0–10.5)
nRBC: 0 % (ref 0.0–0.2)

## 2020-05-12 LAB — URINALYSIS, ROUTINE W REFLEX MICROSCOPIC
Bilirubin Urine: NEGATIVE
Glucose, UA: NEGATIVE mg/dL
Hgb urine dipstick: NEGATIVE
Ketones, ur: NEGATIVE mg/dL
Nitrite: NEGATIVE
Protein, ur: 30 mg/dL — AB
Specific Gravity, Urine: 1.031 — ABNORMAL HIGH (ref 1.005–1.030)
pH: 5 (ref 5.0–8.0)

## 2020-05-12 LAB — SARS CORONAVIRUS 2 BY RT PCR (HOSPITAL ORDER, PERFORMED IN ~~LOC~~ HOSPITAL LAB): SARS Coronavirus 2: POSITIVE — AB

## 2020-05-12 LAB — BASIC METABOLIC PANEL
Anion gap: 12 (ref 5–15)
BUN: 16 mg/dL (ref 8–23)
CO2: 23 mmol/L (ref 22–32)
Calcium: 8.7 mg/dL — ABNORMAL LOW (ref 8.9–10.3)
Chloride: 105 mmol/L (ref 98–111)
Creatinine, Ser: 0.84 mg/dL (ref 0.44–1.00)
GFR calc Af Amer: >60 mL/min (ref >60)
GFR calc non Af Amer: >60 mL/min (ref >60)
Glucose, Bld: 109 mg/dL — ABNORMAL HIGH (ref 70–99)
Potassium: 4.1 mmol/L (ref 3.5–5.1)
Sodium: 140 mmol/L (ref 135–145)

## 2020-05-12 MED ORDER — ACETAMINOPHEN 500 MG PO TABS
1000.0000 mg | ORAL_TABLET | Freq: Once | ORAL | Status: AC
  Administered 2020-05-12: 1000 mg via ORAL
  Filled 2020-05-12: qty 2

## 2020-05-12 NOTE — ED Notes (Signed)
CRITICAL VALUE ALERT  Critical Value:  Covid + Date & Time Notied:  05/12/20 @ 2106 Provider Notified: Deitra Mayo Orders Received/Actions taken: None yet

## 2020-05-12 NOTE — ED Provider Notes (Signed)
[facility] EMERGENCY DEPARTMENT Provider Note   CSN: [id number] Arrival date & time: 05/12/20  1537     History Chief Complaint  Patient presents with  . Headache    Laurie Mcfarland is a 85 y.o. female.  Laurie Mcfarland is an 85 year old female with PMH of hypertension, diabetes, GERD, stage III kidney disease, and fibromyalgia that presents with a chief complaint of headache and cough. Patient is a difficult historian with history of dementia. Patient states that she has the "croup." Upon further questioning patient endorses cough and nasal congestion with waxing and waning SOB for the past 2-3 days. She is waking up with coughing fits and feels poorly overall. She endorses intermittent nausea and chills. She denies fever, chest pain, abdominal pain, vomiting, or diarrhea. Patient also reports headache and can pinpoint the dull pain to her forehead region. She denies any new falls since her last fall evlauation on 05/05/20. She is also reporting generalized aches and pain in her back and legs which she relates to arthritis. Patient has been vaccinated for covid.        Past Medical History:  Diagnosis Date  . Anxiety   . Arthritis   . Cataracts, bilateral   . Depression   . Diabetes mellitus   . Diabetic peripheral neuropathy ([place])   . Fibromyalgia   . GERD (gastroesophageal reflux disease)   . Hyperlipidemia   . Hypertension   . Leg swelling   . Shortness of breath    with exertion  . Weight loss   . Wheezing     Patient Active Problem List   Diagnosis Date Noted  . Abdominal pain 05/05/2020  . Bilateral lower extremity edema 04/07/2020  . Hyperlipidemia associated with type 2 diabetes mellitus ([place]) 03/29/2020  . Hypertension associated with type 2 diabetes mellitus ([place]) 03/29/2020  . CKD stage 3 due to type 2 diabetes mellitus ([place]) 03/29/2020  . Essential tremor 03/29/2020  . Major depression, recurrent, chronic ([place]) 03/29/2020  . Aortic atherosclerosis ([place])  03/29/2020  . Chronic bilateral low back pain without sciatica 03/29/2020  . Dementia with behavioral disturbance ([place])   . Small bowel obstruction ([place]) 03/19/2020  . History of Helicobacter pylori infection 08/19/2019  . Multiple gastric ulcers 08/19/2019  . Multiple duodenal ulcers 08/19/2019  . Abnormal weight loss 08/19/2019  . PUD (peptic ulcer disease)   . Acute blood loss anemia 07/17/2019  . Fibromyalgia 07/17/2019  . Bilateral pulmonary embolism ([place]) 02/16/2018  . Diabetic peripheral neuropathy ([place]) 02/16/2018  . Syncope 06/29/2016  . Renal insufficiency 06/29/2016  . Anemia 06/29/2016  . Edema   . Mild dementia ([place]) 03/01/2015  . Gait instability 03/01/2015  . Frequent falls 03/01/2015  . Memory loss 09/03/2014  . HTN (hypertension) 09/03/2014  . Hyperlipidemia 09/03/2014  . Diabetes type 2, controlled ([place]) 09/03/2014  . Confusion 07/13/2014  . UTI (lower urinary tract infection) 07/13/2014  . Acute encephalopathy 07/13/2014  . Fall at home 07/13/2014  . DM type 2 (diabetes mellitus, type 2) ([place]) 07/13/2014  . Esophageal dysphagia 06/09/2013  . GERD (gastroesophageal reflux disease) 06/09/2013  . Early satiety 06/09/2013    Past Surgical History:  Procedure Laterality Date  . ABDOMINAL HYSTERECTOMY    . CATARACT EXTRACTION W/PHACO  12/10/2011   Procedure: CATARACT EXTRACTION PHACO AND INTRAOCULAR LENS PLACEMENT (IOC);  Surgeon: Elta Guadeloupe T. Gershon Crane, MD;  Location: [facility];  Service: Ophthalmology;  Laterality: Left;  CDE=10.72  . CATARACT EXTRACTION W/PHACO  01/14/2012   Procedure:  CATARACT EXTRACTION PHACO AND INTRAOCULAR LENS PLACEMENT (IOC);  Surgeon: Elta Guadeloupe T. Gershon Crane, MD;  Location: [facility];  Service: Ophthalmology;  Laterality: Right;  CDE:11.63  . ESOPHAGOGASTRODUODENOSCOPY N/A 07/18/2019   multiple gastric and duodenal ulcers,mild Schatzki ring in the distal esophagus,irregular Z-line,and LA grade B esophagitis, +H.pylori serologies s/p treatment  .  ESOPHAGOGASTRODUODENOSCOPY (EGD) WITH ESOPHAGEAL DILATION N/A 06/17/2013   QBH:ALPFXT esophagus-status post Venia Minks dilation. Hiatal hernia. Gastric erosions s/p bx  . ESOPHAGOGASTRODUODENOSCOPY (EGD) WITH PROPOFOL N/A 11/25/2019   Normal esophagus, erosive gastropathy with some crpe appearance much better than previous EGD, superficial erosions/ulcerations small and scattered in the posterior duodenal bulb and second portion of the duodenum s/p biopsies of gastric and duodenal mucosa.  Pathology with peptic duodenitis with ulceration, chronic active gastritis without H. Pylori.  . foreign body removal     splinter removed from right side of face  . LAPAROTOMY N/A 03/22/2020   Procedure: EXPLORATORY LAPAROTOMY;  Surgeon: [name], MD;  Location: [facility];  Service: General;  Laterality: N/A;  . LYSIS OF ADHESION N/A 03/22/2020   Procedure: LYSIS OF ADHESIONS;  Surgeon: [name], MD;  Location: [facility];  Service: General;  Laterality: N/A;     OB History   No obstetric history on file.     Family History  Problem Relation Age of Onset  . Alzheimer's disease Mother   . Cancer Father   . Diabetes Daughter   . High blood pressure Daughter   . High blood pressure Daughter   . Cancer Sister   . Cancer Brother   . Pseudochol deficiency Neg Hx   . Malignant hyperthermia Neg Hx   . Hypotension Neg Hx   . Anesthesia problems Neg Hx   . Colon cancer Neg Hx   . Stomach cancer Neg Hx     Social History   Tobacco Use  . Smoking status: Former Smoker    Quit date: 08/30/1994    Years since quitting: 25.7  . Smokeless tobacco: Never Used  Vaping Use  . Vaping Use: Never used  Substance Use Topics  . Alcohol use: No    Alcohol/week: 0.0 standard drinks  . Drug use: No    Home Medications Prior to Admission medications   Medication Sig Start Date End Date Taking? Authorizing Provider  acetaminophen (TYLENOL) 650 MG CR tablet Take 650 mg by mouth 3 (three) times daily.     [provider]  albuterol (VENTOLIN HFA) 108 (90 Base) MCG/ACT inhaler Inhale 2 puffs into the lungs every 4 (four) hours as needed.  03/28/20   [provider]  ALPRAZolam Duanne Moron) 0.5 MG tablet Take 1 tablet (0.5 mg total) by mouth 3 (three) times daily as needed. 03/28/20   Barton Dubois, MD  atorvastatin (LIPITOR) 20 MG tablet Take 1 tablet (20 mg total) by mouth daily. 03/29/20   Barton Dubois, MD  busPIRone (BUSPAR) 10 MG tablet Take 10 mg by mouth 2 (two) times daily. 03/17/20   [provider]  Cholecalciferol (VITAMIN D-3) 1000 UNITS CAPS Take 1 capsule by mouth 2 (two) times daily.     [provider]  doxycycline (MONODOX) 50 MG capsule Take 50 mg by mouth daily.    [provider]  gabapentin (NEURONTIN) 300 MG capsule Take 1 capsule (300 mg total) by mouth 3 (three) times daily. 03/28/20   Barton Dubois, MD  Lidocaine HCl 4 % PTCH Apply 1 patch topically daily. Apply to lower back daily and remove at [facility] for chronic  back pain. 03/29/20   [provider]  losartan (COZAAR) 25 MG tablet Take 0.5 tablets (12.5 mg total) by mouth daily. 03/28/20   Barton Dubois, MD  memantine (NAMENDA) 10 MG tablet Take 10 mg by mouth 2 (two) times daily. 03/14/20   [provider]  mirtazapine (REMERON) 30 MG tablet Take 30 mg by mouth at bedtime.  01/14/18   [provider]  Multiple Vitamin (MULTIVITAMIN WITH MINERALS) TABS tablet Take 1 tablet by mouth daily.    [provider]  omeprazole (PRILOSEC) 40 MG capsule Take 1 capsule (40 mg total) by mouth 2 (two) times daily. 07/20/19   Barton Dubois, MD  oxyCODONE (ROXICODONE) 5 MG immediate release tablet Take 1 tablet (5 mg total) by mouth in the morning and at bedtime. Patient taking differently: Take 5 mg by mouth 4 (four) times daily as needed.  04/06/20   Gerlene Fee, NP  primidone (MYSOLINE) 50 MG tablet Take 25 mg by mouth at bedtime. 06/21/19   [provider]      Allergies    Penicillins  Review of Systems   Review of Systems  Constitutional: Negative for chills and fever.  HENT: Positive for congestion and rhinorrhea.   Respiratory: Positive for cough and shortness of breath.   Cardiovascular: Negative for chest pain.  Gastrointestinal: Negative for abdominal pain, nausea and vomiting.  Genitourinary: Negative for dysuria and frequency.  Musculoskeletal: Negative for arthralgias, myalgias and neck pain.  Skin: Negative for color change and rash.  Neurological: Positive for headaches.    Physical Exam Updated Vital Signs BP (!) 141/74 (BP Location: Right Arm)   Pulse 62   Temp 98 F (36.7 C) (Oral)   Resp 18   Ht 5\' 2"  (1.575 m)   Wt 74.4 kg   SpO2 97%   BMI 30.00 kg/m   Physical Exam Vitals and nursing note reviewed.  Constitutional:      General: She is not in acute distress.    Appearance: She is well-developed and normal weight. She is not ill-appearing or diaphoretic.     Comments: Elderly female chronically ill-appearing but in no acute distress  HENT:     Head: Normocephalic and atraumatic.     Comments: Faint yellowed bruising over the forehead from previous fall    Mouth/Throat:     Mouth: Mucous membranes are moist.     Pharynx: Oropharynx is clear.  Eyes:     General:        Right eye: No discharge.        Left eye: No discharge.     Pupils: Pupils are equal, round, and reactive to light.  Cardiovascular:     Rate and Rhythm: Normal rate and regular rhythm.     Heart sounds: Normal heart sounds. No murmur heard.  No friction rub. No gallop.   Pulmonary:     Effort: Pulmonary effort is normal. No respiratory distress.     Breath sounds: Rhonchi present. No wheezing or rales.     Comments: Respirations equal and unlabored, satting well on room air, patient does have some scattered rhonchi throughout and intermittent cough during exam but good air movement, no wheezes or rales noted Abdominal:     General:  Bowel sounds are normal. There is no distension.     Palpations: Abdomen is soft. There is no mass.     Tenderness: There is no abdominal tenderness. There is no guarding.  Musculoskeletal:  General: No deformity.     Cervical back: Normal range of motion and neck supple. No rigidity.  Skin:    General: Skin is warm and dry.     Capillary Refill: Capillary refill takes less than 2 seconds.  Neurological:     Mental Status: She is alert and oriented to person, place, and time.     Coordination: Coordination normal.     Comments: Speech is clear, able to follow commands CN III-XII intact Normal strength in upper and lower extremities bilaterally including dorsiflexion and plantar flexion, strong and equal grip strength Sensation normal to light and sharp touch Moves extremities without ataxia, coordination intact  Psychiatric:        Mood and Affect: Mood normal.        Behavior: Behavior normal.     ED Results / Procedures / Treatments   Labs (all labs ordered are listed, but only abnormal results are displayed) Labs Reviewed  SARS CORONAVIRUS 2 BY RT PCR (HOSPITAL ORDER, [facility]) - Abnormal; Notable for the following components:      Result Value   SARS Coronavirus 2 POSITIVE (*)    All other components within normal limits  BASIC METABOLIC PANEL - Abnormal; Notable for the following components:   Glucose, Bld 109 (*)    Calcium 8.7 (*)    All other components within normal limits  URINALYSIS, ROUTINE W REFLEX MICROSCOPIC - Abnormal; Notable for the following components:   APPearance CLOUDY (*)    Specific Gravity, Urine 1.031 (*)    Protein, ur 30 (*)    Leukocytes,Ua MODERATE (*)    Bacteria, UA RARE (*)    All other components within normal limits  CBC WITH DIFFERENTIAL/PLATELET  CBC WITH DIFFERENTIAL/PLATELET    EKG None  Radiology CT Head Wo Contrast  Result Date: 05/12/2020 CLINICAL DATA:  Headache, new or worsening EXAM:  CT HEAD WITHOUT CONTRAST TECHNIQUE: Contiguous axial images were obtained from the base of the skull through the vertex without intravenous contrast. COMPARISON:  Prior head CT examinations 05/05/2020 and earlier. FINDINGS: Brain: Stable, mild generalized parenchymal atrophy. Stable, mild ill-defined hypoattenuation within the cerebral white matter which is nonspecific, but consistent with chronic small vessel ischemic disease. There is no acute intracranial hemorrhage. No demarcated cortical infarct. No extra-axial fluid collection. No evidence of intracranial mass. No midline shift. Vascular: No hyperdense vessel.  Atherosclerotic calcifications Skull: Normal. Negative for fracture or focal lesion. Sinuses/Orbits: Visualized orbits show no acute finding. Frothy secretions within the left sphenoid sinus. Mild mucosal thickening within the ethmoid, right sphenoid and visualized maxillary sinuses. No significant mastoid effusion. IMPRESSION: No CT evidence of acute intracranial abnormality. Stable moderate generalized parenchymal atrophy and mild cerebral white matter chronic small vessel ischemic disease. Left sphenoid sinusitis. Background mild paranasal sinus mucosal thickening as described. Electronically Signed   By: Kellie Simmering DO   On: 05/12/2020 20:03   DG Chest Portable 1 View  Result Date: 05/12/2020 CLINICAL DATA:  Cough. Additional provided: Additional history provided: Headache for 2 days, pending COVID test. EXAM: PORTABLE CHEST 1 VIEW COMPARISON:  Prior chest radiograph 03/19/2020 and earlier. FINDINGS: Heart size within normal limits chronic elevation of the right hemidiaphragm and chronic prominence of the right minor fissure. No appreciable airspace consolidation. No evidence of pleural effusion or pneumothorax. No acute bony abnormality identified. IMPRESSION: No evidence of acute cardiopulmonary abnormality. Chronic elevation of the right hemidiaphragm and chronic prominence of the right  minor fissure, unchanged Electronically  Signed   By: Kellie Simmering DO   On: 05/12/2020 19:56    Procedures Procedures (including critical care time)  Medications Ordered in ED Medications  acetaminophen (TYLENOL) tablet 1,000 mg (1,000 mg Oral Given 05/12/20 1942)    ED Course  I have reviewed the triage vital signs and the nursing notes.  Pertinent labs & imaging results that were available during my care of the patient were reviewed by me and considered in my medical decision making (see chart for details).  Clinical Course as of May 13 2151  Fri May 12, 9318  1161 85 year old female here for complaint of headache in the setting of a recent fall.  She also said she has had a cough.  Getting labs head CT chest x-ray Covid testing.  Disposition per results of testing.   [MB]    Clinical Course User Index [MB] Hayden Rasmussen, MD   MDM Rules/Calculators/A&P                           85 year old female sent from skilled nursing facility for evaluation of headaches that she has been complaining of over the past 2 days but on initial evaluation patient is complaining more so of cough with concern that she "has the croup".  On exam she is intermittently coughing and has some faint scattered rhonchi on exam but is satting well on room air, she complains of a frontal headache and has some yellowed bruising and was recently evaluated after a fall last week.  She also complains of some sore throat and shortness of breath, denies any chest pain, has no vomiting or diarrhea.  Patient is neurologically intact.  Headaches could be related to recent fall will repeat head CT, could also be related to viral infection, given cough, sore throat and other infectious symptoms.  Patient is at high risk for Covid although she has been vaccinated she lives in a skilled nursing facility.  Will get Covid swab, basic labs, chest x-ray and head CT.  I have independently ordered, reviewed and interpreted all labs  and imaging: Patient with mild leukopenia, stable hemoglobin of 9.9 when compared to previous.  She has no significant electrolyte derangements and normal renal function.  Urinalysis with some rare bacteria and WBCs present but many squamous cells, I feel this is more likely from contamination, patient is not experiencing any urinary symptoms.  Her chest x-ray and head CT are clear.  Notified by nursing staff that patient is Covid positive which explains the headaches, cough and shortness of breath she has been experiencing.  She ambulated in the department and maintained O2 sats greater than 98% without increased work of breathing which is very reassuring.  Currently patient's Covid symptoms are mild and feel she is stable for discharge back to her skilled nursing facility with close monitoring and follow-up.  Return precautions provided.  Discharged home in good condition.  Laurie Mcfarland was evaluated in Emergency Department on 05/12/2020 for the symptoms described in the history of present illness. She was evaluated in the context of the global COVID-19 pandemic, which necessitated consideration that the patient might be at risk for infection with the SARS-CoV-2 virus that causes COVID-19. Institutional protocols and algorithms that pertain to the evaluation of patients at risk for COVID-19 are in a state of rapid change based on information released by regulatory bodies including the CDC and federal and state organizations. These policies and algorithms were followed during  the patient's care in the ED.]  Final Clinical Impression(s) / ED Diagnoses Final diagnoses:  COVID-19 virus infection  Acute nonintractable headache, unspecified headache type    Rx / DC Orders ED Discharge Orders    None       Janet Berlin 05/12/20 2352    Hayden Rasmussen, MD 05/13/20 1145

## 2020-05-12 NOTE — ED Triage Notes (Signed)
Pt presents to ED with complaints of headache x 2 days. Pt from [facility].

## 2020-05-12 NOTE — ED Notes (Signed)
Pt ambulated in room with pulse oximeter. Pulse oximeter reading was 98% at beginning of ambulation and remained at 98% during ambulation.

## 2020-05-12 NOTE — Discharge Instructions (Signed)
You have tested positive for COVID-19 virus.  Please continue to quarantine at home and monitor your symptoms closely. You chest x-ray was clear. Antibiotics are not helpful in treating viral infection, the virus should run its course in about 10-14 days. Please make sure you are drinking plenty of fluids. You can treat your symptoms supportively with tylenol for fevers and pains, and over the counter cough syrups and throat lozenges to help with cough. If your symptoms are not improving please follow up with you Primary doctor.   I recommend that you purchase a home pulse ox to help better monitor your oxygen at home, if you start to have increased work of breathing or shortness of breath or your oxygen drops below 92% please immediately return to the hospital for reevaluation.  If you develop persistent fevers, shortness of breath or difficulty breathing, chest pain, severe headache and neck pain, persistent nausea and vomiting or other new or concerning symptoms return to the Emergency department.

## 2020-05-15 DIAGNOSIS — U071 COVID-19: Secondary | ICD-10-CM | POA: Diagnosis not present

## 2020-05-15 DIAGNOSIS — N189 Chronic kidney disease, unspecified: Secondary | ICD-10-CM | POA: Diagnosis not present

## 2020-05-15 DIAGNOSIS — E119 Type 2 diabetes mellitus without complications: Secondary | ICD-10-CM | POA: Diagnosis not present

## 2020-05-16 DIAGNOSIS — J329 Chronic sinusitis, unspecified: Secondary | ICD-10-CM | POA: Diagnosis not present

## 2020-05-16 DIAGNOSIS — G894 Chronic pain syndrome: Secondary | ICD-10-CM | POA: Diagnosis not present

## 2020-05-16 DIAGNOSIS — Z681 Body mass index (BMI) 19 or less, adult: Secondary | ICD-10-CM | POA: Diagnosis not present

## 2020-05-16 DIAGNOSIS — M1991 Primary osteoarthritis, unspecified site: Secondary | ICD-10-CM | POA: Diagnosis not present

## 2020-05-17 DIAGNOSIS — E119 Type 2 diabetes mellitus without complications: Secondary | ICD-10-CM | POA: Diagnosis not present

## 2020-05-17 DIAGNOSIS — N189 Chronic kidney disease, unspecified: Secondary | ICD-10-CM | POA: Diagnosis not present

## 2020-05-18 ENCOUNTER — Other Ambulatory Visit: Payer: Self-pay | Admitting: Internal Medicine

## 2020-05-18 DIAGNOSIS — U071 COVID-19: Secondary | ICD-10-CM | POA: Diagnosis not present

## 2020-05-22 DIAGNOSIS — N189 Chronic kidney disease, unspecified: Secondary | ICD-10-CM | POA: Diagnosis not present

## 2020-05-22 DIAGNOSIS — E119 Type 2 diabetes mellitus without complications: Secondary | ICD-10-CM | POA: Diagnosis not present

## 2020-05-23 DIAGNOSIS — E119 Type 2 diabetes mellitus without complications: Secondary | ICD-10-CM | POA: Diagnosis not present

## 2020-05-23 DIAGNOSIS — N189 Chronic kidney disease, unspecified: Secondary | ICD-10-CM | POA: Diagnosis not present

## 2020-05-30 DIAGNOSIS — N189 Chronic kidney disease, unspecified: Secondary | ICD-10-CM | POA: Diagnosis not present

## 2020-05-30 DIAGNOSIS — E119 Type 2 diabetes mellitus without complications: Secondary | ICD-10-CM | POA: Diagnosis not present

## 2020-05-30 DIAGNOSIS — N390 Urinary tract infection, site not specified: Secondary | ICD-10-CM | POA: Diagnosis not present

## 2020-06-01 DIAGNOSIS — N189 Chronic kidney disease, unspecified: Secondary | ICD-10-CM | POA: Diagnosis not present

## 2020-06-01 DIAGNOSIS — E119 Type 2 diabetes mellitus without complications: Secondary | ICD-10-CM | POA: Diagnosis not present

## 2020-06-05 DIAGNOSIS — E119 Type 2 diabetes mellitus without complications: Secondary | ICD-10-CM | POA: Diagnosis not present

## 2020-06-05 DIAGNOSIS — N189 Chronic kidney disease, unspecified: Secondary | ICD-10-CM | POA: Diagnosis not present

## 2020-06-07 DIAGNOSIS — G309 Alzheimer's disease, unspecified: Secondary | ICD-10-CM | POA: Diagnosis not present

## 2020-06-07 DIAGNOSIS — M461 Sacroiliitis, not elsewhere classified: Secondary | ICD-10-CM | POA: Diagnosis not present

## 2020-06-07 DIAGNOSIS — R296 Repeated falls: Secondary | ICD-10-CM | POA: Diagnosis not present

## 2020-06-07 DIAGNOSIS — R413 Other amnesia: Secondary | ICD-10-CM | POA: Diagnosis not present

## 2020-06-09 DIAGNOSIS — E119 Type 2 diabetes mellitus without complications: Secondary | ICD-10-CM | POA: Diagnosis not present

## 2020-06-09 DIAGNOSIS — N189 Chronic kidney disease, unspecified: Secondary | ICD-10-CM | POA: Diagnosis not present

## 2020-06-13 DIAGNOSIS — U071 COVID-19: Secondary | ICD-10-CM | POA: Diagnosis not present

## 2020-06-22 DIAGNOSIS — Z23 Encounter for immunization: Secondary | ICD-10-CM | POA: Diagnosis not present

## 2020-06-24 NOTE — Progress Notes (Deleted)
Referring Provider: [name], MD Primary Care Physician:  [name], MD Primary GI Physician: Dr. Gala Romney  No chief complaint on file.   HPI:   Laurie Mcfarland is a 85 y.o. female with a history of IDA, hospitalization for acute blood loss anemia and melena Nov 2020 s/p EGD with PUD, +H.pylori serologies s/p treatment. Surveillance EGD March 2021. Negative H.pylori on path. Inpatient July 2021 with distal SBO and undergoing exploratory laparotomy and lysis of adhesions.   Now residing at [facility].   Last seen in our office 05/05/2020.  She presents today for 6-8-week follow-up.  She reported continuous lower abdominal pain for "quite a while".  Reported no flatus.  Denied constipation, diarrhea, GI bleeding, nausea, vomiting.  She is obviously uncomfortable and appeared pale on exam.  She had an unwitnessed fall earlier that morning falling backwards and hitting her head.  Denies neuro changes.  She was advised to go to the emergency room.   In the ED, lipase slightly elevated at 57, WBC normal, hemoglobin 10.0 (L), CMP essentially normal other than glucose 128 (H), UA negative.  CT head with no evidence of acute intracranial abnormality, left sphenoid sinusitis.  CT cervical spine no acute abnormalities.  CT A/P with contrast with mild perienteric stranding in the right lower quadrant, mild thickening of the appendix, mild periappendiceal stranding, increased colonic vascularity and colonic thickening perhaps through hepatic flexure, question pancolitis though difficult assessment due to underdistention.  Case was reviewed with Dr. Constance Haw who doubted acute appendicitis considering chronicity and doubted colitis with no diarrhea and normal WBC.  Recommended follow-up in office on 9/7.  Patient followed up with Dr. Constance Haw on 9/11 and reported no abdominal pain.  Suspected either postoperative reactive changes or resolving colitis on CT.  Tested positive for COVID-19 05/12/2020.     Today:    Consider colonoscopy.       Past Medical History:  Diagnosis Date  . Anxiety   . Arthritis   . Cataracts, bilateral   . Depression   . Diabetes mellitus   . Diabetic peripheral neuropathy ([place])   . Fibromyalgia   . GERD (gastroesophageal reflux disease)   . Hyperlipidemia   . Hypertension   . Leg swelling   . Shortness of breath    with exertion  . Weight loss   . Wheezing     Past Surgical History:  Procedure Laterality Date  . ABDOMINAL HYSTERECTOMY    . CATARACT EXTRACTION W/PHACO  12/10/2011   Procedure: CATARACT EXTRACTION PHACO AND INTRAOCULAR LENS PLACEMENT (IOC);  Surgeon: Elta Guadeloupe T. Gershon Crane, MD;  Location: [facility];  Service: Ophthalmology;  Laterality: Left;  CDE=10.72  . CATARACT EXTRACTION W/PHACO  01/14/2012   Procedure: CATARACT EXTRACTION PHACO AND INTRAOCULAR LENS PLACEMENT (IOC);  Surgeon: Elta Guadeloupe T. Gershon Crane, MD;  Location: [facility];  Service: Ophthalmology;  Laterality: Right;  CDE:11.63  . ESOPHAGOGASTRODUODENOSCOPY N/A 07/18/2019   multiple gastric and duodenal ulcers,mild Schatzki ring in the distal esophagus,irregular Z-line,and LA grade B esophagitis, +H.pylori serologies s/p treatment  . ESOPHAGOGASTRODUODENOSCOPY (EGD) WITH ESOPHAGEAL DILATION N/A 06/17/2013   DPO:EUMPNT esophagus-status post Venia Minks dilation. Hiatal hernia. Gastric erosions s/p bx  . ESOPHAGOGASTRODUODENOSCOPY (EGD) WITH PROPOFOL N/A 11/25/2019   Normal esophagus, erosive gastropathy with some crpe appearance much better than previous EGD, superficial erosions/ulcerations small and scattered in the posterior duodenal bulb and second portion of the duodenum s/p biopsies of gastric and duodenal mucosa.  Pathology with peptic duodenitis with ulceration, chronic active gastritis without H.  Pylori.  . foreign body removal     splinter removed from right side of face  . LAPAROTOMY N/A 03/22/2020   Procedure: EXPLORATORY LAPAROTOMY;  Surgeon: [name], MD;  Location: [facility];   Service: General;  Laterality: N/A;  . LYSIS OF ADHESION N/A 03/22/2020   Procedure: LYSIS OF ADHESIONS;  Surgeon: [name], MD;  Location: [facility];  Service: General;  Laterality: N/A;    Current Outpatient Medications  Medication Sig Dispense Refill  . acetaminophen (TYLENOL) 650 MG CR tablet Take 650 mg by mouth 3 (three) times daily.    Marland Kitchen albuterol (VENTOLIN HFA) 108 (90 Base) MCG/ACT inhaler Inhale 2 puffs into the lungs every 4 (four) hours as needed.     . ALPRAZolam (XANAX) 0.5 MG tablet Take 1 tablet (0.5 mg total) by mouth 3 (three) times daily as needed. 15 tablet 0  . atorvastatin (LIPITOR) 20 MG tablet Take 1 tablet (20 mg total) by mouth daily.    . busPIRone (BUSPAR) 10 MG tablet Take 10 mg by mouth 2 (two) times daily.    . Cholecalciferol (VITAMIN D-3) 1000 UNITS CAPS Take 1 capsule by mouth 2 (two) times daily.     Marland Kitchen doxycycline (MONODOX) 50 MG capsule Take 50 mg by mouth daily.    Marland Kitchen gabapentin (NEURONTIN) 300 MG capsule Take 1 capsule (300 mg total) by mouth 3 (three) times daily.    . Lidocaine HCl 4 % PTCH Apply 1 patch topically daily. Apply to lower back daily and remove at [facility] for chronic back pain.    Marland Kitchen losartan (COZAAR) 25 MG tablet Take 0.5 tablets (12.5 mg total) by mouth daily.    . memantine (NAMENDA) 10 MG tablet Take 10 mg by mouth 2 (two) times daily.    . mirtazapine (REMERON) 30 MG tablet Take 30 mg by mouth at bedtime.     . Multiple Vitamin (MULTIVITAMIN WITH MINERALS) TABS tablet Take 1 tablet by mouth daily.    Marland Kitchen omeprazole (PRILOSEC) 40 MG capsule Take 1 capsule (40 mg total) by mouth 2 (two) times daily. 60 capsule 2  . oxyCODONE (ROXICODONE) 5 MG immediate release tablet Take 1 tablet (5 mg total) by mouth in the morning and at bedtime. (Patient taking differently: Take 5 mg by mouth 4 (four) times daily as needed. ) 60 tablet 0  . primidone (MYSOLINE) 50 MG tablet Take 25 mg by mouth at bedtime.     No current facility-administered medications for  this visit.    Allergies as of 06/26/2020 - Review Complete 05/12/2020  Allergen Reaction Noted  . Penicillins Rash 06/09/2013    Family History  Problem Relation Age of Onset  . Alzheimer's disease Mother   . Cancer Father   . Diabetes Daughter   . High blood pressure Daughter   . High blood pressure Daughter   . Cancer Sister   . Cancer Brother   . Pseudochol deficiency Neg Hx   . Malignant hyperthermia Neg Hx   . Hypotension Neg Hx   . Anesthesia problems Neg Hx   . Colon cancer Neg Hx   . Stomach cancer Neg Hx     Social History   Socioeconomic History  . Marital status: Widowed    Spouse name: Not on file  . Number of children: 4  . Years of education: Not on file  . Highest education level: Not on file  Occupational History  . Not on file  Tobacco Use  . Smoking status: Former  Smoker    Quit date: 08/30/1994    Years since quitting: 25.8  . Smokeless tobacco: Never Used  Vaping Use  . Vaping Use: Never used  Substance and Sexual Activity  . Alcohol use: No    Alcohol/week: 0.0 standard drinks  . Drug use: No  . Sexual activity: Not Currently  Other Topics Concern  . Not on file  Social History Narrative   Adopted granddaughter and raised great grandson.   Pt states that her grandson lives with her sometimes,    Social Determinants of Health   Financial Resource Strain: Low Risk   . Difficulty of Paying Living Expenses: Not hard at all  Food Insecurity: No Food Insecurity  . Worried About Charity fundraiser in the Last Year: Never true  . Ran Out of Food in the Last Year: Never true  Transportation Needs: No Transportation Needs  . Lack of Transportation (Medical): No  . Lack of Transportation (Non-Medical): No  Physical Activity: Inactive  . Days of Exercise per Week: 0 days  . Minutes of Exercise per Session: 0 min  Stress: Stress Concern Present  . Feeling of Stress : To some extent  Social Connections: Moderately Integrated  . Frequency  of Communication with Friends and Family: More than three times a week  . Frequency of Social Gatherings with Friends and Family: More than three times a week  . Attends Religious Services: More than 4 times per year  . Active Member of Clubs or Organizations: Yes  . Attends Archivist Meetings: More than 4 times per year  . Marital Status: Widowed    Review of Systems: Gen: Denies fever, chills, anorexia. Denies fatigue, weakness, weight loss.  CV: Denies chest pain, palpitations, syncope, peripheral edema, and claudication. Resp: Denies dyspnea at rest, cough, wheezing, coughing up blood, and pleurisy. GI: Denies vomiting blood, jaundice, and fecal incontinence.   Denies dysphagia or odynophagia. Derm: Denies rash, itching, dry skin Psych: Denies depression, anxiety, memory loss, confusion. No homicidal or suicidal ideation.  Heme: Denies bruising, bleeding, and enlarged lymph nodes.  Physical Exam: There were no vitals taken for this visit. General:   Alert and oriented. No distress noted. Pleasant and cooperative.  Head:  Normocephalic and atraumatic. Eyes:  Conjuctiva clear without scleral icterus. Mouth:  Oral mucosa pink and moist. Good dentition. No lesions. Heart:  S1, S2 present without murmurs appreciated. Lungs:  Clear to auscultation bilaterally. No wheezes, rales, or rhonchi. No distress.  Abdomen:  +BS, soft, non-tender and non-distended. No rebound or guarding. No HSM or masses noted. Msk:  Symmetrical without gross deformities. Normal posture. Extremities:  Without edema. Neurologic:  Alert and  oriented x4 Psych:  Alert and cooperative. Normal mood and affect.

## 2020-06-26 ENCOUNTER — Ambulatory Visit: Payer: Medicare Other | Admitting: Gastroenterology

## 2020-06-27 DIAGNOSIS — U071 COVID-19: Secondary | ICD-10-CM | POA: Diagnosis not present

## 2020-08-22 DIAGNOSIS — U071 COVID-19: Secondary | ICD-10-CM | POA: Diagnosis not present

## 2020-08-24 DIAGNOSIS — U071 COVID-19: Secondary | ICD-10-CM | POA: Diagnosis not present

## 2020-08-29 DIAGNOSIS — U071 COVID-19: Secondary | ICD-10-CM | POA: Diagnosis not present

## 2020-08-31 DIAGNOSIS — U071 COVID-19: Secondary | ICD-10-CM | POA: Diagnosis not present

## 2020-09-05 DIAGNOSIS — U071 COVID-19: Secondary | ICD-10-CM | POA: Diagnosis not present

## 2020-09-07 DIAGNOSIS — U071 COVID-19: Secondary | ICD-10-CM | POA: Diagnosis not present

## 2020-09-12 DIAGNOSIS — U071 COVID-19: Secondary | ICD-10-CM | POA: Diagnosis not present

## 2020-09-14 DIAGNOSIS — U071 COVID-19: Secondary | ICD-10-CM | POA: Diagnosis not present

## 2020-09-19 DIAGNOSIS — U071 COVID-19: Secondary | ICD-10-CM | POA: Diagnosis not present

## 2020-09-21 DIAGNOSIS — U071 COVID-19: Secondary | ICD-10-CM | POA: Diagnosis not present

## 2020-09-22 DIAGNOSIS — K219 Gastro-esophageal reflux disease without esophagitis: Secondary | ICD-10-CM | POA: Diagnosis not present

## 2020-09-22 DIAGNOSIS — Z0001 Encounter for general adult medical examination with abnormal findings: Secondary | ICD-10-CM | POA: Diagnosis not present

## 2020-09-22 DIAGNOSIS — G894 Chronic pain syndrome: Secondary | ICD-10-CM | POA: Diagnosis not present

## 2020-09-22 DIAGNOSIS — E118 Type 2 diabetes mellitus with unspecified complications: Secondary | ICD-10-CM | POA: Diagnosis not present

## 2020-09-22 DIAGNOSIS — Z1389 Encounter for screening for other disorder: Secondary | ICD-10-CM | POA: Diagnosis not present

## 2020-09-22 DIAGNOSIS — R739 Hyperglycemia, unspecified: Secondary | ICD-10-CM | POA: Diagnosis not present

## 2020-09-22 DIAGNOSIS — U071 COVID-19: Secondary | ICD-10-CM | POA: Diagnosis not present

## 2020-09-22 DIAGNOSIS — E1129 Type 2 diabetes mellitus with other diabetic kidney complication: Secondary | ICD-10-CM | POA: Diagnosis not present

## 2020-09-22 DIAGNOSIS — I10 Essential (primary) hypertension: Secondary | ICD-10-CM | POA: Diagnosis not present

## 2020-09-22 DIAGNOSIS — E039 Hypothyroidism, unspecified: Secondary | ICD-10-CM | POA: Diagnosis not present

## 2020-09-22 DIAGNOSIS — E7849 Other hyperlipidemia: Secondary | ICD-10-CM | POA: Diagnosis not present

## 2020-09-25 DIAGNOSIS — M461 Sacroiliitis, not elsewhere classified: Secondary | ICD-10-CM | POA: Diagnosis not present

## 2020-09-25 DIAGNOSIS — R000 Tachycardia, unspecified: Secondary | ICD-10-CM | POA: Diagnosis not present

## 2020-09-25 DIAGNOSIS — R296 Repeated falls: Secondary | ICD-10-CM | POA: Diagnosis not present

## 2020-09-25 DIAGNOSIS — I10 Essential (primary) hypertension: Secondary | ICD-10-CM | POA: Diagnosis not present

## 2020-09-25 DIAGNOSIS — M130 Polyarthritis, unspecified: Secondary | ICD-10-CM | POA: Diagnosis not present

## 2020-09-25 DIAGNOSIS — G309 Alzheimer's disease, unspecified: Secondary | ICD-10-CM | POA: Diagnosis not present

## 2020-09-26 DIAGNOSIS — U071 COVID-19: Secondary | ICD-10-CM | POA: Diagnosis not present

## 2020-09-28 DIAGNOSIS — U071 COVID-19: Secondary | ICD-10-CM | POA: Diagnosis not present

## 2020-10-02 DIAGNOSIS — I10 Essential (primary) hypertension: Secondary | ICD-10-CM | POA: Diagnosis not present

## 2020-10-03 DIAGNOSIS — U071 COVID-19: Secondary | ICD-10-CM | POA: Diagnosis not present

## 2020-10-05 DIAGNOSIS — U071 COVID-19: Secondary | ICD-10-CM | POA: Diagnosis not present

## 2020-10-10 DIAGNOSIS — U071 COVID-19: Secondary | ICD-10-CM | POA: Diagnosis not present

## 2020-10-12 DIAGNOSIS — U071 COVID-19: Secondary | ICD-10-CM | POA: Diagnosis not present

## 2020-10-17 DIAGNOSIS — U071 COVID-19: Secondary | ICD-10-CM | POA: Diagnosis not present

## 2020-10-19 DIAGNOSIS — U071 COVID-19: Secondary | ICD-10-CM | POA: Diagnosis not present

## 2020-10-24 DIAGNOSIS — U071 COVID-19: Secondary | ICD-10-CM | POA: Diagnosis not present

## 2020-10-26 DIAGNOSIS — U071 COVID-19: Secondary | ICD-10-CM | POA: Diagnosis not present

## 2020-10-31 DIAGNOSIS — U071 COVID-19: Secondary | ICD-10-CM | POA: Diagnosis not present

## 2020-11-02 DIAGNOSIS — U071 COVID-19: Secondary | ICD-10-CM | POA: Diagnosis not present

## 2020-11-07 DIAGNOSIS — U071 COVID-19: Secondary | ICD-10-CM | POA: Diagnosis not present

## 2020-11-14 DIAGNOSIS — U071 COVID-19: Secondary | ICD-10-CM | POA: Diagnosis not present

## 2020-11-21 DIAGNOSIS — U071 COVID-19: Secondary | ICD-10-CM | POA: Diagnosis not present

## 2020-11-21 DIAGNOSIS — R278 Other lack of coordination: Secondary | ICD-10-CM | POA: Diagnosis not present

## 2020-11-22 DIAGNOSIS — M532X6 Spinal instabilities, lumbar region: Secondary | ICD-10-CM | POA: Diagnosis not present

## 2020-11-22 DIAGNOSIS — M170 Bilateral primary osteoarthritis of knee: Secondary | ICD-10-CM | POA: Diagnosis not present

## 2020-11-23 DIAGNOSIS — M170 Bilateral primary osteoarthritis of knee: Secondary | ICD-10-CM | POA: Diagnosis not present

## 2020-11-23 DIAGNOSIS — M532X6 Spinal instabilities, lumbar region: Secondary | ICD-10-CM | POA: Diagnosis not present

## 2020-11-24 DIAGNOSIS — R278 Other lack of coordination: Secondary | ICD-10-CM | POA: Diagnosis not present

## 2020-11-27 DIAGNOSIS — R278 Other lack of coordination: Secondary | ICD-10-CM | POA: Diagnosis not present

## 2020-11-28 DIAGNOSIS — M532X6 Spinal instabilities, lumbar region: Secondary | ICD-10-CM | POA: Diagnosis not present

## 2020-11-28 DIAGNOSIS — M170 Bilateral primary osteoarthritis of knee: Secondary | ICD-10-CM | POA: Diagnosis not present

## 2020-11-30 DIAGNOSIS — M170 Bilateral primary osteoarthritis of knee: Secondary | ICD-10-CM | POA: Diagnosis not present

## 2020-11-30 DIAGNOSIS — M532X6 Spinal instabilities, lumbar region: Secondary | ICD-10-CM | POA: Diagnosis not present

## 2020-11-30 DIAGNOSIS — U071 COVID-19: Secondary | ICD-10-CM | POA: Diagnosis not present

## 2020-12-01 DIAGNOSIS — R278 Other lack of coordination: Secondary | ICD-10-CM | POA: Diagnosis not present

## 2020-12-04 DIAGNOSIS — R278 Other lack of coordination: Secondary | ICD-10-CM | POA: Diagnosis not present

## 2020-12-05 DIAGNOSIS — M170 Bilateral primary osteoarthritis of knee: Secondary | ICD-10-CM | POA: Diagnosis not present

## 2020-12-05 DIAGNOSIS — U071 COVID-19: Secondary | ICD-10-CM | POA: Diagnosis not present

## 2020-12-05 DIAGNOSIS — M532X6 Spinal instabilities, lumbar region: Secondary | ICD-10-CM | POA: Diagnosis not present

## 2020-12-07 DIAGNOSIS — M532X6 Spinal instabilities, lumbar region: Secondary | ICD-10-CM | POA: Diagnosis not present

## 2020-12-07 DIAGNOSIS — M170 Bilateral primary osteoarthritis of knee: Secondary | ICD-10-CM | POA: Diagnosis not present

## 2020-12-08 DIAGNOSIS — R278 Other lack of coordination: Secondary | ICD-10-CM | POA: Diagnosis not present

## 2020-12-10 DIAGNOSIS — M532X6 Spinal instabilities, lumbar region: Secondary | ICD-10-CM | POA: Diagnosis not present

## 2020-12-10 DIAGNOSIS — M170 Bilateral primary osteoarthritis of knee: Secondary | ICD-10-CM | POA: Diagnosis not present

## 2020-12-12 DIAGNOSIS — U071 COVID-19: Secondary | ICD-10-CM | POA: Diagnosis not present

## 2020-12-12 DIAGNOSIS — M170 Bilateral primary osteoarthritis of knee: Secondary | ICD-10-CM | POA: Diagnosis not present

## 2020-12-12 DIAGNOSIS — M532X6 Spinal instabilities, lumbar region: Secondary | ICD-10-CM | POA: Diagnosis not present

## 2020-12-18 DIAGNOSIS — R296 Repeated falls: Secondary | ICD-10-CM | POA: Diagnosis not present

## 2020-12-18 DIAGNOSIS — R000 Tachycardia, unspecified: Secondary | ICD-10-CM | POA: Diagnosis not present

## 2020-12-18 DIAGNOSIS — I10 Essential (primary) hypertension: Secondary | ICD-10-CM | POA: Diagnosis not present

## 2020-12-18 DIAGNOSIS — M130 Polyarthritis, unspecified: Secondary | ICD-10-CM | POA: Diagnosis not present

## 2020-12-18 DIAGNOSIS — M461 Sacroiliitis, not elsewhere classified: Secondary | ICD-10-CM | POA: Diagnosis not present

## 2020-12-18 DIAGNOSIS — G894 Chronic pain syndrome: Secondary | ICD-10-CM | POA: Diagnosis not present

## 2020-12-19 DIAGNOSIS — M170 Bilateral primary osteoarthritis of knee: Secondary | ICD-10-CM | POA: Diagnosis not present

## 2020-12-19 DIAGNOSIS — M532X6 Spinal instabilities, lumbar region: Secondary | ICD-10-CM | POA: Diagnosis not present

## 2020-12-19 DIAGNOSIS — U071 COVID-19: Secondary | ICD-10-CM | POA: Diagnosis not present

## 2020-12-21 DIAGNOSIS — M532X6 Spinal instabilities, lumbar region: Secondary | ICD-10-CM | POA: Diagnosis not present

## 2020-12-21 DIAGNOSIS — M170 Bilateral primary osteoarthritis of knee: Secondary | ICD-10-CM | POA: Diagnosis not present

## 2020-12-25 DIAGNOSIS — I10 Essential (primary) hypertension: Secondary | ICD-10-CM | POA: Diagnosis not present

## 2020-12-25 DIAGNOSIS — E1169 Type 2 diabetes mellitus with other specified complication: Secondary | ICD-10-CM | POA: Diagnosis not present

## 2020-12-25 DIAGNOSIS — I700 Atherosclerosis of aorta: Secondary | ICD-10-CM | POA: Diagnosis not present

## 2020-12-26 DIAGNOSIS — U071 COVID-19: Secondary | ICD-10-CM | POA: Diagnosis not present

## 2020-12-27 DIAGNOSIS — M532X6 Spinal instabilities, lumbar region: Secondary | ICD-10-CM | POA: Diagnosis not present

## 2020-12-27 DIAGNOSIS — M170 Bilateral primary osteoarthritis of knee: Secondary | ICD-10-CM | POA: Diagnosis not present

## 2020-12-28 DIAGNOSIS — M532X6 Spinal instabilities, lumbar region: Secondary | ICD-10-CM | POA: Diagnosis not present

## 2020-12-28 DIAGNOSIS — M170 Bilateral primary osteoarthritis of knee: Secondary | ICD-10-CM | POA: Diagnosis not present

## 2020-12-31 DIAGNOSIS — M170 Bilateral primary osteoarthritis of knee: Secondary | ICD-10-CM | POA: Diagnosis not present

## 2020-12-31 DIAGNOSIS — M532X6 Spinal instabilities, lumbar region: Secondary | ICD-10-CM | POA: Diagnosis not present

## 2021-01-02 DIAGNOSIS — U071 COVID-19: Secondary | ICD-10-CM | POA: Diagnosis not present

## 2021-01-05 DIAGNOSIS — M532X6 Spinal instabilities, lumbar region: Secondary | ICD-10-CM | POA: Diagnosis not present

## 2021-01-05 DIAGNOSIS — M170 Bilateral primary osteoarthritis of knee: Secondary | ICD-10-CM | POA: Diagnosis not present

## 2021-01-07 DIAGNOSIS — M170 Bilateral primary osteoarthritis of knee: Secondary | ICD-10-CM | POA: Diagnosis not present

## 2021-01-07 DIAGNOSIS — M532X6 Spinal instabilities, lumbar region: Secondary | ICD-10-CM | POA: Diagnosis not present

## 2021-01-09 DIAGNOSIS — U071 COVID-19: Secondary | ICD-10-CM | POA: Diagnosis not present

## 2021-01-11 DIAGNOSIS — M532X6 Spinal instabilities, lumbar region: Secondary | ICD-10-CM | POA: Diagnosis not present

## 2021-01-11 DIAGNOSIS — M170 Bilateral primary osteoarthritis of knee: Secondary | ICD-10-CM | POA: Diagnosis not present

## 2021-01-16 DIAGNOSIS — U071 COVID-19: Secondary | ICD-10-CM | POA: Diagnosis not present

## 2021-01-23 DIAGNOSIS — U071 COVID-19: Secondary | ICD-10-CM | POA: Diagnosis not present

## 2021-01-31 DIAGNOSIS — U071 COVID-19: Secondary | ICD-10-CM | POA: Diagnosis not present

## 2021-02-06 DIAGNOSIS — U071 COVID-19: Secondary | ICD-10-CM | POA: Diagnosis not present

## 2021-02-13 DIAGNOSIS — U071 COVID-19: Secondary | ICD-10-CM | POA: Diagnosis not present

## 2021-02-20 DIAGNOSIS — U071 COVID-19: Secondary | ICD-10-CM | POA: Diagnosis not present

## 2021-02-27 DIAGNOSIS — U071 COVID-19: Secondary | ICD-10-CM | POA: Diagnosis not present

## 2021-03-06 DIAGNOSIS — U071 COVID-19: Secondary | ICD-10-CM | POA: Diagnosis not present

## 2021-03-13 DIAGNOSIS — U071 COVID-19: Secondary | ICD-10-CM | POA: Diagnosis not present

## 2021-03-19 DIAGNOSIS — I10 Essential (primary) hypertension: Secondary | ICD-10-CM | POA: Diagnosis not present

## 2021-03-19 DIAGNOSIS — M461 Sacroiliitis, not elsewhere classified: Secondary | ICD-10-CM | POA: Diagnosis not present

## 2021-03-19 DIAGNOSIS — R000 Tachycardia, unspecified: Secondary | ICD-10-CM | POA: Diagnosis not present

## 2021-03-19 DIAGNOSIS — M130 Polyarthritis, unspecified: Secondary | ICD-10-CM | POA: Diagnosis not present

## 2021-03-19 DIAGNOSIS — G894 Chronic pain syndrome: Secondary | ICD-10-CM | POA: Diagnosis not present

## 2021-03-19 DIAGNOSIS — R296 Repeated falls: Secondary | ICD-10-CM | POA: Diagnosis not present

## 2021-03-20 DIAGNOSIS — U071 COVID-19: Secondary | ICD-10-CM | POA: Diagnosis not present

## 2021-03-27 DIAGNOSIS — I700 Atherosclerosis of aorta: Secondary | ICD-10-CM | POA: Diagnosis not present

## 2021-03-27 DIAGNOSIS — Z6824 Body mass index (BMI) 24.0-24.9, adult: Secondary | ICD-10-CM | POA: Diagnosis not present

## 2021-03-27 DIAGNOSIS — R6889 Other general symptoms and signs: Secondary | ICD-10-CM | POA: Diagnosis not present

## 2021-03-27 DIAGNOSIS — U071 COVID-19: Secondary | ICD-10-CM | POA: Diagnosis not present

## 2021-03-27 DIAGNOSIS — E1129 Type 2 diabetes mellitus with other diabetic kidney complication: Secondary | ICD-10-CM | POA: Diagnosis not present

## 2021-03-27 DIAGNOSIS — I10 Essential (primary) hypertension: Secondary | ICD-10-CM | POA: Diagnosis not present

## 2021-03-27 DIAGNOSIS — R2689 Other abnormalities of gait and mobility: Secondary | ICD-10-CM | POA: Diagnosis not present

## 2021-03-27 DIAGNOSIS — E1122 Type 2 diabetes mellitus with diabetic chronic kidney disease: Secondary | ICD-10-CM | POA: Diagnosis not present

## 2021-03-27 DIAGNOSIS — N183 Chronic kidney disease, stage 3 unspecified: Secondary | ICD-10-CM | POA: Diagnosis not present

## 2021-03-28 ENCOUNTER — Other Ambulatory Visit (HOSPITAL_COMMUNITY): Payer: Self-pay | Admitting: Internal Medicine

## 2021-03-28 DIAGNOSIS — I10 Essential (primary) hypertension: Secondary | ICD-10-CM | POA: Diagnosis not present

## 2021-03-28 DIAGNOSIS — N183 Chronic kidney disease, stage 3 unspecified: Secondary | ICD-10-CM | POA: Diagnosis not present

## 2021-03-28 DIAGNOSIS — Z1231 Encounter for screening mammogram for malignant neoplasm of breast: Secondary | ICD-10-CM

## 2021-03-28 DIAGNOSIS — E1122 Type 2 diabetes mellitus with diabetic chronic kidney disease: Secondary | ICD-10-CM | POA: Diagnosis not present

## 2021-03-28 DIAGNOSIS — R2681 Unsteadiness on feet: Secondary | ICD-10-CM | POA: Diagnosis not present

## 2021-03-28 DIAGNOSIS — I700 Atherosclerosis of aorta: Secondary | ICD-10-CM | POA: Diagnosis not present

## 2021-03-29 DIAGNOSIS — I10 Essential (primary) hypertension: Secondary | ICD-10-CM | POA: Diagnosis not present

## 2021-03-29 DIAGNOSIS — N183 Chronic kidney disease, stage 3 unspecified: Secondary | ICD-10-CM | POA: Diagnosis not present

## 2021-03-29 DIAGNOSIS — I700 Atherosclerosis of aorta: Secondary | ICD-10-CM | POA: Diagnosis not present

## 2021-03-29 DIAGNOSIS — R2681 Unsteadiness on feet: Secondary | ICD-10-CM | POA: Diagnosis not present

## 2021-03-29 DIAGNOSIS — E1122 Type 2 diabetes mellitus with diabetic chronic kidney disease: Secondary | ICD-10-CM | POA: Diagnosis not present

## 2021-03-30 DIAGNOSIS — I10 Essential (primary) hypertension: Secondary | ICD-10-CM | POA: Diagnosis not present

## 2021-03-30 DIAGNOSIS — N183 Chronic kidney disease, stage 3 unspecified: Secondary | ICD-10-CM | POA: Diagnosis not present

## 2021-03-30 DIAGNOSIS — E1122 Type 2 diabetes mellitus with diabetic chronic kidney disease: Secondary | ICD-10-CM | POA: Diagnosis not present

## 2021-03-30 DIAGNOSIS — I700 Atherosclerosis of aorta: Secondary | ICD-10-CM | POA: Diagnosis not present

## 2021-03-30 DIAGNOSIS — R2681 Unsteadiness on feet: Secondary | ICD-10-CM | POA: Diagnosis not present

## 2021-04-02 DIAGNOSIS — N183 Chronic kidney disease, stage 3 unspecified: Secondary | ICD-10-CM | POA: Diagnosis not present

## 2021-04-02 DIAGNOSIS — I700 Atherosclerosis of aorta: Secondary | ICD-10-CM | POA: Diagnosis not present

## 2021-04-02 DIAGNOSIS — R2681 Unsteadiness on feet: Secondary | ICD-10-CM | POA: Diagnosis not present

## 2021-04-02 DIAGNOSIS — I10 Essential (primary) hypertension: Secondary | ICD-10-CM | POA: Diagnosis not present

## 2021-04-02 DIAGNOSIS — E1122 Type 2 diabetes mellitus with diabetic chronic kidney disease: Secondary | ICD-10-CM | POA: Diagnosis not present

## 2021-04-03 DIAGNOSIS — U071 COVID-19: Secondary | ICD-10-CM | POA: Diagnosis not present

## 2021-04-05 DIAGNOSIS — N183 Chronic kidney disease, stage 3 unspecified: Secondary | ICD-10-CM | POA: Diagnosis not present

## 2021-04-05 DIAGNOSIS — I700 Atherosclerosis of aorta: Secondary | ICD-10-CM | POA: Diagnosis not present

## 2021-04-05 DIAGNOSIS — R2681 Unsteadiness on feet: Secondary | ICD-10-CM | POA: Diagnosis not present

## 2021-04-05 DIAGNOSIS — I10 Essential (primary) hypertension: Secondary | ICD-10-CM | POA: Diagnosis not present

## 2021-04-05 DIAGNOSIS — E1122 Type 2 diabetes mellitus with diabetic chronic kidney disease: Secondary | ICD-10-CM | POA: Diagnosis not present

## 2021-04-06 DIAGNOSIS — R2681 Unsteadiness on feet: Secondary | ICD-10-CM | POA: Diagnosis not present

## 2021-04-06 DIAGNOSIS — I700 Atherosclerosis of aorta: Secondary | ICD-10-CM | POA: Diagnosis not present

## 2021-04-06 DIAGNOSIS — E1122 Type 2 diabetes mellitus with diabetic chronic kidney disease: Secondary | ICD-10-CM | POA: Diagnosis not present

## 2021-04-06 DIAGNOSIS — I10 Essential (primary) hypertension: Secondary | ICD-10-CM | POA: Diagnosis not present

## 2021-04-06 DIAGNOSIS — N183 Chronic kidney disease, stage 3 unspecified: Secondary | ICD-10-CM | POA: Diagnosis not present

## 2021-04-09 DIAGNOSIS — I700 Atherosclerosis of aorta: Secondary | ICD-10-CM | POA: Diagnosis not present

## 2021-04-09 DIAGNOSIS — R2681 Unsteadiness on feet: Secondary | ICD-10-CM | POA: Diagnosis not present

## 2021-04-09 DIAGNOSIS — I10 Essential (primary) hypertension: Secondary | ICD-10-CM | POA: Diagnosis not present

## 2021-04-09 DIAGNOSIS — E1122 Type 2 diabetes mellitus with diabetic chronic kidney disease: Secondary | ICD-10-CM | POA: Diagnosis not present

## 2021-04-09 DIAGNOSIS — N183 Chronic kidney disease, stage 3 unspecified: Secondary | ICD-10-CM | POA: Diagnosis not present

## 2021-04-10 DIAGNOSIS — I700 Atherosclerosis of aorta: Secondary | ICD-10-CM | POA: Diagnosis not present

## 2021-04-10 DIAGNOSIS — I10 Essential (primary) hypertension: Secondary | ICD-10-CM | POA: Diagnosis not present

## 2021-04-10 DIAGNOSIS — R2681 Unsteadiness on feet: Secondary | ICD-10-CM | POA: Diagnosis not present

## 2021-04-10 DIAGNOSIS — E1122 Type 2 diabetes mellitus with diabetic chronic kidney disease: Secondary | ICD-10-CM | POA: Diagnosis not present

## 2021-04-10 DIAGNOSIS — U071 COVID-19: Secondary | ICD-10-CM | POA: Diagnosis not present

## 2021-04-10 DIAGNOSIS — N183 Chronic kidney disease, stage 3 unspecified: Secondary | ICD-10-CM | POA: Diagnosis not present

## 2021-04-11 DIAGNOSIS — E1122 Type 2 diabetes mellitus with diabetic chronic kidney disease: Secondary | ICD-10-CM | POA: Diagnosis not present

## 2021-04-11 DIAGNOSIS — I10 Essential (primary) hypertension: Secondary | ICD-10-CM | POA: Diagnosis not present

## 2021-04-11 DIAGNOSIS — N183 Chronic kidney disease, stage 3 unspecified: Secondary | ICD-10-CM | POA: Diagnosis not present

## 2021-04-11 DIAGNOSIS — I700 Atherosclerosis of aorta: Secondary | ICD-10-CM | POA: Diagnosis not present

## 2021-04-11 DIAGNOSIS — R2681 Unsteadiness on feet: Secondary | ICD-10-CM | POA: Diagnosis not present

## 2021-04-12 ENCOUNTER — Ambulatory Visit (HOSPITAL_COMMUNITY)
Admission: RE | Admit: 2021-04-12 | Discharge: 2021-04-12 | Disposition: A | Discharge status: Home / Self Care | Payer: Medicare Other | Source: Ambulatory Visit | Attending: Internal Medicine | Admitting: Internal Medicine

## 2021-04-12 ENCOUNTER — Other Ambulatory Visit: Payer: Self-pay

## 2021-04-12 DIAGNOSIS — Z1231 Encounter for screening mammogram for malignant neoplasm of breast: Secondary | ICD-10-CM | POA: Insufficient documentation

## 2021-04-17 DIAGNOSIS — U071 COVID-19: Secondary | ICD-10-CM | POA: Diagnosis not present

## 2021-04-18 ENCOUNTER — Other Ambulatory Visit (HOSPITAL_COMMUNITY): Payer: Self-pay | Admitting: Physician Assistant

## 2021-04-18 DIAGNOSIS — R928 Other abnormal and inconclusive findings on diagnostic imaging of breast: Secondary | ICD-10-CM

## 2021-04-23 DIAGNOSIS — R2681 Unsteadiness on feet: Secondary | ICD-10-CM | POA: Diagnosis not present

## 2021-04-23 DIAGNOSIS — I10 Essential (primary) hypertension: Secondary | ICD-10-CM | POA: Diagnosis not present

## 2021-04-23 DIAGNOSIS — E1122 Type 2 diabetes mellitus with diabetic chronic kidney disease: Secondary | ICD-10-CM | POA: Diagnosis not present

## 2021-04-23 DIAGNOSIS — I700 Atherosclerosis of aorta: Secondary | ICD-10-CM | POA: Diagnosis not present

## 2021-04-23 DIAGNOSIS — N183 Chronic kidney disease, stage 3 unspecified: Secondary | ICD-10-CM | POA: Diagnosis not present

## 2021-04-24 ENCOUNTER — Ambulatory Visit (HOSPITAL_COMMUNITY): Payer: Medicare Other

## 2021-04-24 ENCOUNTER — Encounter (HOSPITAL_COMMUNITY): Payer: Medicare Other

## 2021-04-25 DIAGNOSIS — U071 COVID-19: Secondary | ICD-10-CM | POA: Diagnosis not present

## 2021-04-26 DIAGNOSIS — I10 Essential (primary) hypertension: Secondary | ICD-10-CM | POA: Diagnosis not present

## 2021-04-26 DIAGNOSIS — R2681 Unsteadiness on feet: Secondary | ICD-10-CM | POA: Diagnosis not present

## 2021-04-26 DIAGNOSIS — E1122 Type 2 diabetes mellitus with diabetic chronic kidney disease: Secondary | ICD-10-CM | POA: Diagnosis not present

## 2021-04-26 DIAGNOSIS — I700 Atherosclerosis of aorta: Secondary | ICD-10-CM | POA: Diagnosis not present

## 2021-04-26 DIAGNOSIS — N183 Chronic kidney disease, stage 3 unspecified: Secondary | ICD-10-CM | POA: Diagnosis not present

## 2021-04-30 DIAGNOSIS — I10 Essential (primary) hypertension: Secondary | ICD-10-CM | POA: Diagnosis not present

## 2021-04-30 DIAGNOSIS — R2681 Unsteadiness on feet: Secondary | ICD-10-CM | POA: Diagnosis not present

## 2021-04-30 DIAGNOSIS — U071 COVID-19: Secondary | ICD-10-CM | POA: Diagnosis not present

## 2021-04-30 DIAGNOSIS — I700 Atherosclerosis of aorta: Secondary | ICD-10-CM | POA: Diagnosis not present

## 2021-04-30 DIAGNOSIS — N183 Chronic kidney disease, stage 3 unspecified: Secondary | ICD-10-CM | POA: Diagnosis not present

## 2021-04-30 DIAGNOSIS — E1122 Type 2 diabetes mellitus with diabetic chronic kidney disease: Secondary | ICD-10-CM | POA: Diagnosis not present

## 2021-05-02 DIAGNOSIS — I700 Atherosclerosis of aorta: Secondary | ICD-10-CM | POA: Diagnosis not present

## 2021-05-02 DIAGNOSIS — N183 Chronic kidney disease, stage 3 unspecified: Secondary | ICD-10-CM | POA: Diagnosis not present

## 2021-05-02 DIAGNOSIS — E1122 Type 2 diabetes mellitus with diabetic chronic kidney disease: Secondary | ICD-10-CM | POA: Diagnosis not present

## 2021-05-02 DIAGNOSIS — R2681 Unsteadiness on feet: Secondary | ICD-10-CM | POA: Diagnosis not present

## 2021-05-02 DIAGNOSIS — I10 Essential (primary) hypertension: Secondary | ICD-10-CM | POA: Diagnosis not present

## 2021-05-08 DIAGNOSIS — R2681 Unsteadiness on feet: Secondary | ICD-10-CM | POA: Diagnosis not present

## 2021-05-08 DIAGNOSIS — I10 Essential (primary) hypertension: Secondary | ICD-10-CM | POA: Diagnosis not present

## 2021-05-08 DIAGNOSIS — E1122 Type 2 diabetes mellitus with diabetic chronic kidney disease: Secondary | ICD-10-CM | POA: Diagnosis not present

## 2021-05-08 DIAGNOSIS — N183 Chronic kidney disease, stage 3 unspecified: Secondary | ICD-10-CM | POA: Diagnosis not present

## 2021-05-08 DIAGNOSIS — U071 COVID-19: Secondary | ICD-10-CM | POA: Diagnosis not present

## 2021-05-08 DIAGNOSIS — I700 Atherosclerosis of aorta: Secondary | ICD-10-CM | POA: Diagnosis not present

## 2021-05-09 DIAGNOSIS — N183 Chronic kidney disease, stage 3 unspecified: Secondary | ICD-10-CM | POA: Diagnosis not present

## 2021-05-09 DIAGNOSIS — I10 Essential (primary) hypertension: Secondary | ICD-10-CM | POA: Diagnosis not present

## 2021-05-09 DIAGNOSIS — E1122 Type 2 diabetes mellitus with diabetic chronic kidney disease: Secondary | ICD-10-CM | POA: Diagnosis not present

## 2021-05-09 DIAGNOSIS — R2681 Unsteadiness on feet: Secondary | ICD-10-CM | POA: Diagnosis not present

## 2021-05-09 DIAGNOSIS — I700 Atherosclerosis of aorta: Secondary | ICD-10-CM | POA: Diagnosis not present

## 2021-05-14 DIAGNOSIS — E1122 Type 2 diabetes mellitus with diabetic chronic kidney disease: Secondary | ICD-10-CM | POA: Diagnosis not present

## 2021-05-14 DIAGNOSIS — I10 Essential (primary) hypertension: Secondary | ICD-10-CM | POA: Diagnosis not present

## 2021-05-14 DIAGNOSIS — N183 Chronic kidney disease, stage 3 unspecified: Secondary | ICD-10-CM | POA: Diagnosis not present

## 2021-05-14 DIAGNOSIS — U071 COVID-19: Secondary | ICD-10-CM | POA: Diagnosis not present

## 2021-05-14 DIAGNOSIS — R2681 Unsteadiness on feet: Secondary | ICD-10-CM | POA: Diagnosis not present

## 2021-05-14 DIAGNOSIS — I700 Atherosclerosis of aorta: Secondary | ICD-10-CM | POA: Diagnosis not present

## 2021-05-16 DIAGNOSIS — E1122 Type 2 diabetes mellitus with diabetic chronic kidney disease: Secondary | ICD-10-CM | POA: Diagnosis not present

## 2021-05-16 DIAGNOSIS — R2681 Unsteadiness on feet: Secondary | ICD-10-CM | POA: Diagnosis not present

## 2021-05-16 DIAGNOSIS — I10 Essential (primary) hypertension: Secondary | ICD-10-CM | POA: Diagnosis not present

## 2021-05-16 DIAGNOSIS — I700 Atherosclerosis of aorta: Secondary | ICD-10-CM | POA: Diagnosis not present

## 2021-05-16 DIAGNOSIS — N183 Chronic kidney disease, stage 3 unspecified: Secondary | ICD-10-CM | POA: Diagnosis not present

## 2021-05-21 DIAGNOSIS — U071 COVID-19: Secondary | ICD-10-CM | POA: Diagnosis not present

## 2021-05-22 ENCOUNTER — Ambulatory Visit (HOSPITAL_COMMUNITY)
Admission: RE | Admit: 2021-05-22 | Discharge: 2021-05-22 | Disposition: A | Discharge status: Home / Self Care | Payer: Medicare Other | Source: Ambulatory Visit | Attending: Physician Assistant | Admitting: Physician Assistant

## 2021-05-22 ENCOUNTER — Other Ambulatory Visit: Payer: Self-pay

## 2021-05-22 ENCOUNTER — Other Ambulatory Visit (HOSPITAL_COMMUNITY): Payer: Self-pay | Admitting: Physician Assistant

## 2021-05-22 DIAGNOSIS — R928 Other abnormal and inconclusive findings on diagnostic imaging of breast: Secondary | ICD-10-CM | POA: Diagnosis not present

## 2021-05-22 DIAGNOSIS — R922 Inconclusive mammogram: Secondary | ICD-10-CM | POA: Diagnosis not present

## 2021-05-23 ENCOUNTER — Encounter (HOSPITAL_COMMUNITY): Payer: Self-pay

## 2021-05-23 ENCOUNTER — Other Ambulatory Visit (HOSPITAL_COMMUNITY): Payer: Self-pay | Admitting: Internal Medicine

## 2021-05-23 ENCOUNTER — Ambulatory Visit (HOSPITAL_COMMUNITY)
Admission: RE | Admit: 2021-05-23 | Discharge: 2021-05-23 | Disposition: A | Discharge status: Home / Self Care | Payer: Medicare Other | Source: Ambulatory Visit | Attending: Internal Medicine | Admitting: Internal Medicine

## 2021-05-23 ENCOUNTER — Ambulatory Visit (HOSPITAL_COMMUNITY)
Admission: RE | Admit: 2021-05-23 | Discharge: 2021-05-23 | Disposition: A | Discharge status: Home / Self Care | Payer: Medicare Other | Source: Ambulatory Visit | Attending: Physician Assistant | Admitting: Physician Assistant

## 2021-05-23 DIAGNOSIS — N6321 Unspecified lump in the left breast, upper outer quadrant: Secondary | ICD-10-CM | POA: Diagnosis not present

## 2021-05-23 DIAGNOSIS — N6342 Unspecified lump in left breast, subareolar: Secondary | ICD-10-CM | POA: Diagnosis not present

## 2021-05-23 DIAGNOSIS — R928 Other abnormal and inconclusive findings on diagnostic imaging of breast: Secondary | ICD-10-CM

## 2021-05-23 MED ORDER — LIDOCAINE-EPINEPHRINE (PF) 2 %-1:200000 IJ SOLN
INTRAMUSCULAR | Status: AC
  Administered 2021-05-23: 10 mL
  Filled 2021-05-23: qty 10

## 2021-05-23 MED ORDER — LIDOCAINE HCL (PF) 2 % IJ SOLN
INTRAMUSCULAR | Status: AC
  Administered 2021-05-23: 10 mL
  Filled 2021-05-23: qty 10

## 2021-05-23 NOTE — Progress Notes (Signed)
PT tolerated left breast biopsy well today with NAD noted. PT verbalized understanding of discharge instructions. PT ambulated back to the mammogram area this time. Instructions and forms given to [facility] staff member in waiting area.

## 2021-05-24 LAB — SURGICAL PATHOLOGY

## 2021-05-28 DIAGNOSIS — U071 COVID-19: Secondary | ICD-10-CM | POA: Diagnosis not present

## 2021-06-05 DIAGNOSIS — U071 COVID-19: Secondary | ICD-10-CM | POA: Diagnosis not present

## 2021-06-13 DIAGNOSIS — Z23 Encounter for immunization: Secondary | ICD-10-CM | POA: Diagnosis not present

## 2021-06-18 DIAGNOSIS — U071 COVID-19: Secondary | ICD-10-CM | POA: Diagnosis not present

## 2021-06-21 DIAGNOSIS — E782 Mixed hyperlipidemia: Secondary | ICD-10-CM | POA: Diagnosis not present

## 2021-06-21 DIAGNOSIS — E1129 Type 2 diabetes mellitus with other diabetic kidney complication: Secondary | ICD-10-CM | POA: Diagnosis not present

## 2021-06-21 DIAGNOSIS — G894 Chronic pain syndrome: Secondary | ICD-10-CM | POA: Diagnosis not present

## 2021-06-21 DIAGNOSIS — M1991 Primary osteoarthritis, unspecified site: Secondary | ICD-10-CM | POA: Diagnosis not present

## 2021-06-21 DIAGNOSIS — E1140 Type 2 diabetes mellitus with diabetic neuropathy, unspecified: Secondary | ICD-10-CM | POA: Diagnosis not present

## 2021-06-21 DIAGNOSIS — Z6824 Body mass index (BMI) 24.0-24.9, adult: Secondary | ICD-10-CM | POA: Diagnosis not present

## 2021-06-21 DIAGNOSIS — I10 Essential (primary) hypertension: Secondary | ICD-10-CM | POA: Diagnosis not present

## 2021-06-21 DIAGNOSIS — K219 Gastro-esophageal reflux disease without esophagitis: Secondary | ICD-10-CM | POA: Diagnosis not present

## 2021-06-27 DIAGNOSIS — U071 COVID-19: Secondary | ICD-10-CM | POA: Diagnosis not present

## 2021-07-04 DIAGNOSIS — U071 COVID-19: Secondary | ICD-10-CM | POA: Diagnosis not present

## 2021-07-11 DIAGNOSIS — U071 COVID-19: Secondary | ICD-10-CM | POA: Diagnosis not present

## 2021-07-17 DIAGNOSIS — U071 COVID-19: Secondary | ICD-10-CM | POA: Diagnosis not present

## 2021-07-23 DIAGNOSIS — U071 COVID-19: Secondary | ICD-10-CM | POA: Diagnosis not present

## 2021-07-31 DIAGNOSIS — U071 COVID-19: Secondary | ICD-10-CM | POA: Diagnosis not present

## 2021-09-19 DIAGNOSIS — R296 Repeated falls: Secondary | ICD-10-CM | POA: Diagnosis not present

## 2022-02-26 DIAGNOSIS — G301 Alzheimer's disease with late onset: Secondary | ICD-10-CM | POA: Diagnosis not present

## 2022-02-26 DIAGNOSIS — R251 Tremor, unspecified: Secondary | ICD-10-CM | POA: Diagnosis not present

## 2022-02-26 DIAGNOSIS — I2782 Chronic pulmonary embolism: Secondary | ICD-10-CM | POA: Diagnosis not present

## 2022-02-26 DIAGNOSIS — Z79899 Other long term (current) drug therapy: Secondary | ICD-10-CM | POA: Diagnosis not present

## 2022-04-23 ENCOUNTER — Telehealth: Payer: Self-pay | Admitting: *Deleted

## 2022-04-23 NOTE — Patient Outreach (Signed)
  Care Coordination   Initial Visit Note   04/23/2022 Name: Laurie Mcfarland MRN: [id number] DOB: 1935/04/11  Laurie Mcfarland is a 87 y.o. year old female who sees [name], MD for primary care. I  spoke with [facility] by telephone. Patient is a resident there and has all care coordination needs met through the center.   What matters to the patients health and wellness today?  No current needs  SDOH assessments and interventions completed:  No     Care Coordination Interventions Activated:  No  Care Coordination Interventions:  No, not indicated   Follow up plan: No further intervention required.   Encounter Outcome:  Pt. Visit Completed   Chong Sicilian, BSN, RN-BC RN Care Coordinator Direct Dial: [phone]

## 2022-05-02 DIAGNOSIS — I700 Atherosclerosis of aorta: Secondary | ICD-10-CM | POA: Diagnosis not present

## 2022-05-02 DIAGNOSIS — E1122 Type 2 diabetes mellitus with diabetic chronic kidney disease: Secondary | ICD-10-CM | POA: Diagnosis not present

## 2022-05-02 DIAGNOSIS — E559 Vitamin D deficiency, unspecified: Secondary | ICD-10-CM | POA: Diagnosis not present

## 2022-05-02 DIAGNOSIS — H353131 Nonexudative age-related macular degeneration, bilateral, early dry stage: Secondary | ICD-10-CM | POA: Diagnosis not present

## 2022-05-02 DIAGNOSIS — E1140 Type 2 diabetes mellitus with diabetic neuropathy, unspecified: Secondary | ICD-10-CM | POA: Diagnosis not present

## 2022-05-02 DIAGNOSIS — Z0001 Encounter for general adult medical examination with abnormal findings: Secondary | ICD-10-CM | POA: Diagnosis not present

## 2022-05-02 DIAGNOSIS — E1129 Type 2 diabetes mellitus with other diabetic kidney complication: Secondary | ICD-10-CM | POA: Diagnosis not present

## 2022-06-06 DIAGNOSIS — Z23 Encounter for immunization: Secondary | ICD-10-CM | POA: Diagnosis not present

## 2022-07-05 IMAGING — DX DG CHEST 1V PORT
1 series · 1 of 1 positions shown · non-contrast
Comparison: February 16, 2018

CLINICAL DATA: Epigastric and chest pain.

EXAM:
PORTABLE CHEST 1 VIEW

[chest ap]
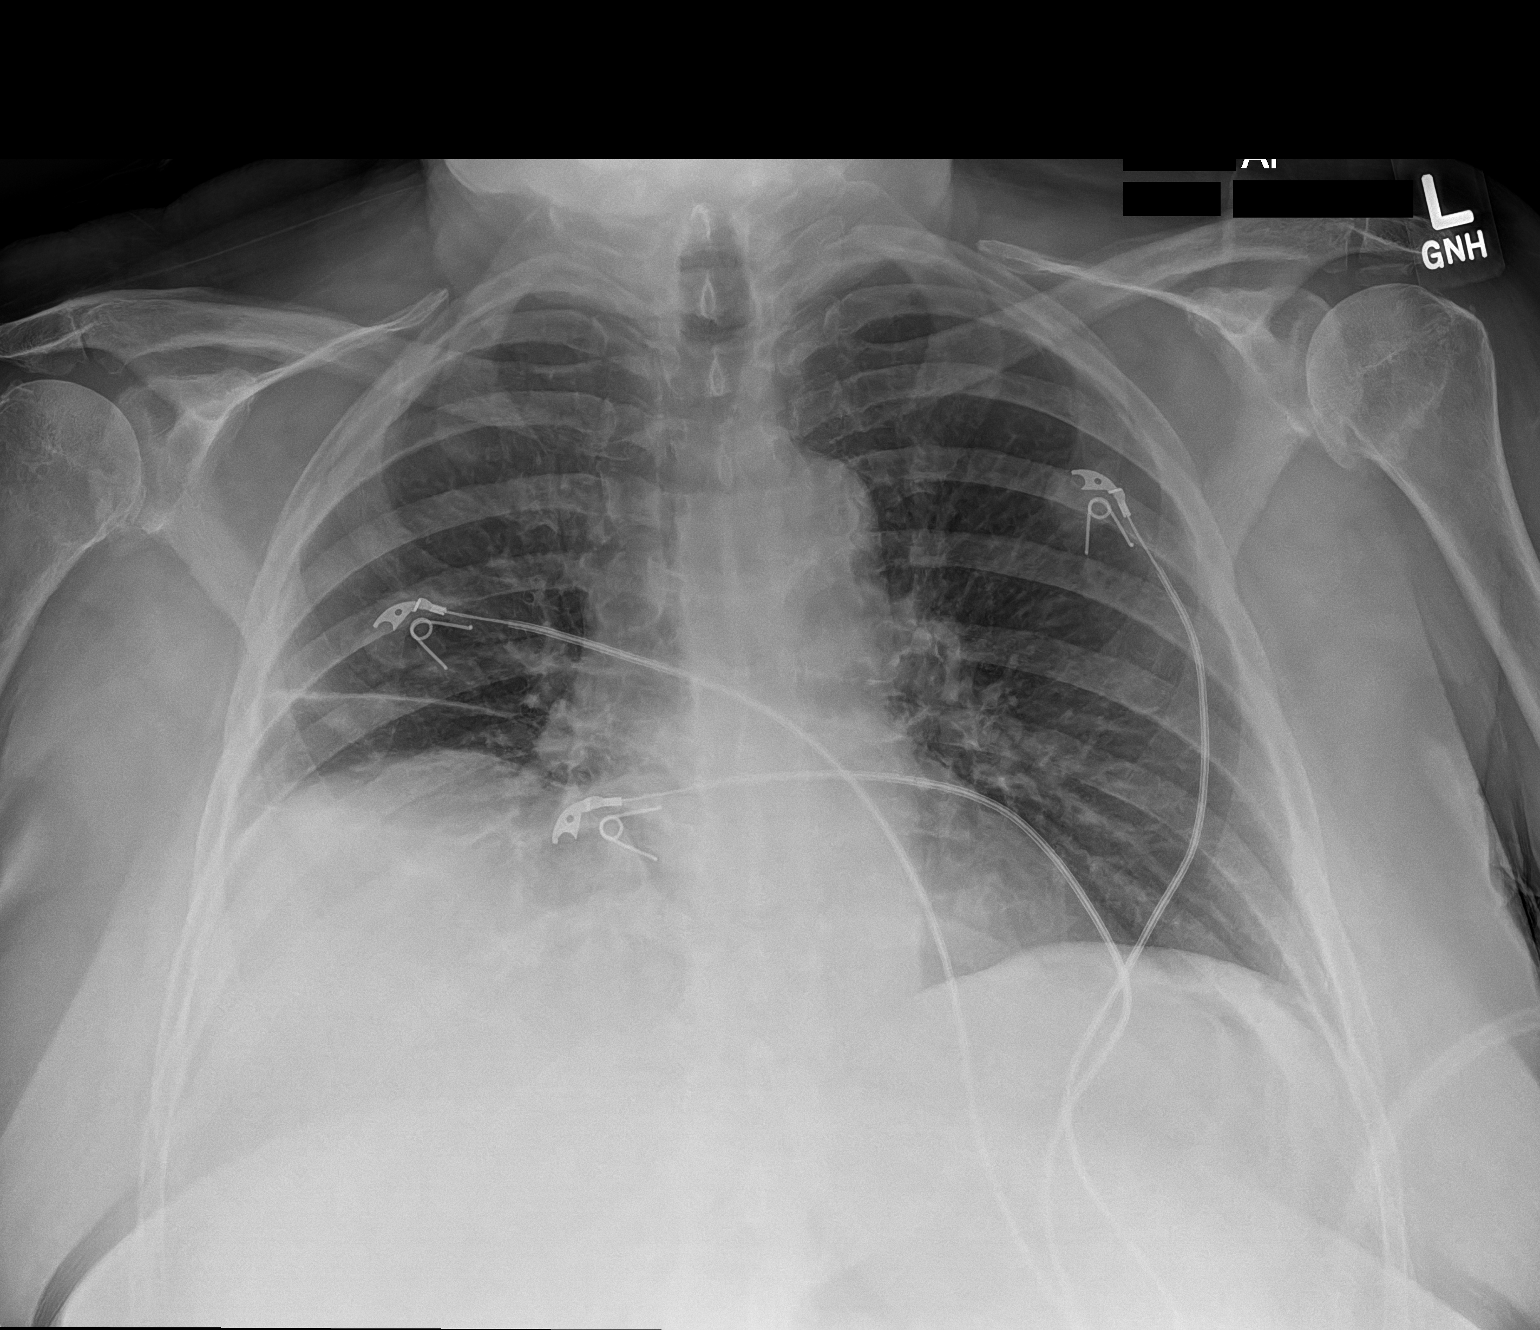

[1 of 1 positions shown; findings below may reference images not displayed]

FINDINGS: Cardiomediastinal silhouette is normal. Mediastinal contours appear
intact. Chronic elevation of the right hemidiaphragm.

There is no evidence of focal airspace consolidation, pleural
effusion or pneumothorax.

Osseous structures are without acute abnormality. Soft tissues are
grossly normal.
IMPRESSION: No active disease.

Chronic elevation of right hemidiaphragm.

## 2022-07-05 IMAGING — CT CT ABD-PELV W/O CM
2 of 4 series · 16 of 46 positions shown, 18 images · non-contrast
Comparison: None.

CLINICAL DATA: Acute lower abdominal pain.

EXAM:
CT ABDOMEN AND PELVIS WITHOUT CONTRAST
TECHNIQUE: Multidetector CT imaging of the abdomen and pelvis was performed
following the standard protocol without IV contrast.

[Series 2: axial st · axial · 0.78mm/px · z∈[+635,+1070]mm · 13 of 99 slices shown, 15 images]
[im 6/99  soft-tissue]
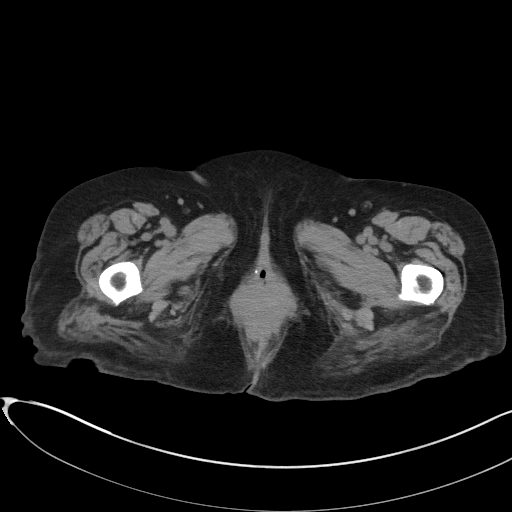
[im 6/99  bone]
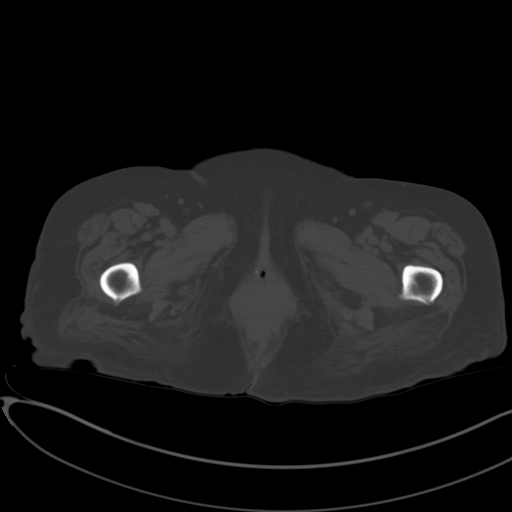
[im 11/99  soft-tissue]
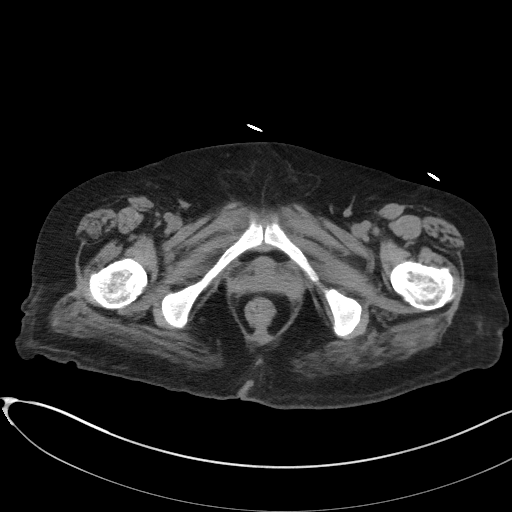
[im 22/99  soft-tissue]
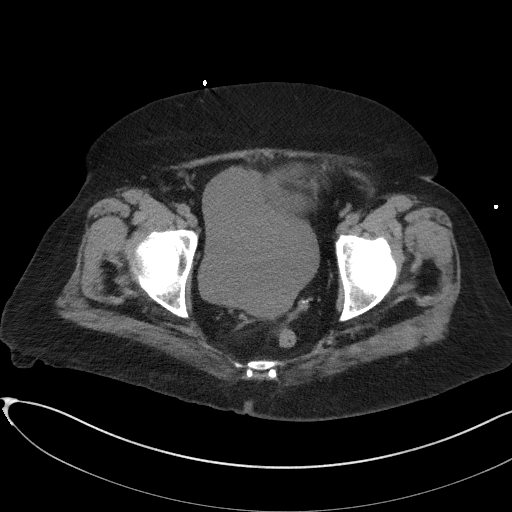
[im 28/99  soft-tissue]
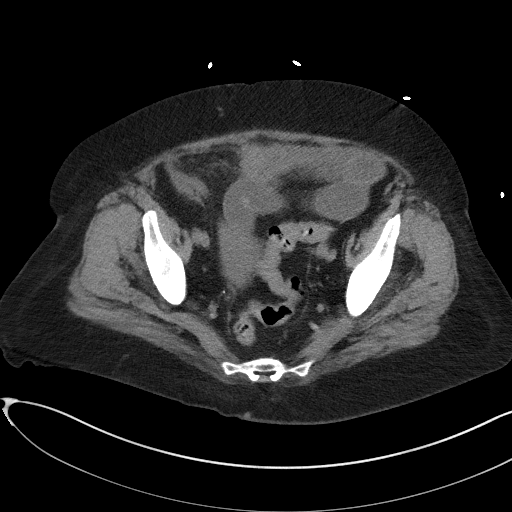
[im 33/99  soft-tissue]
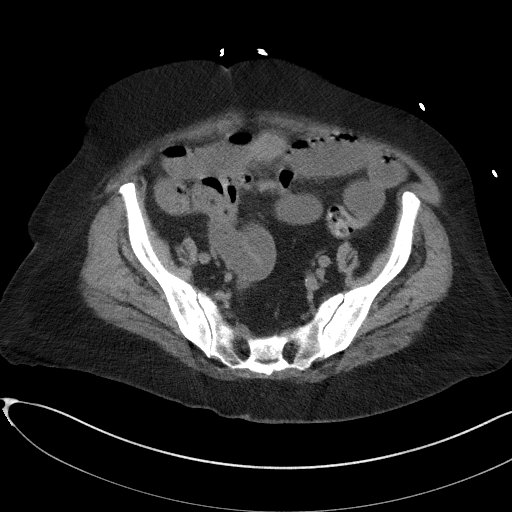
[im 44/99  soft-tissue]
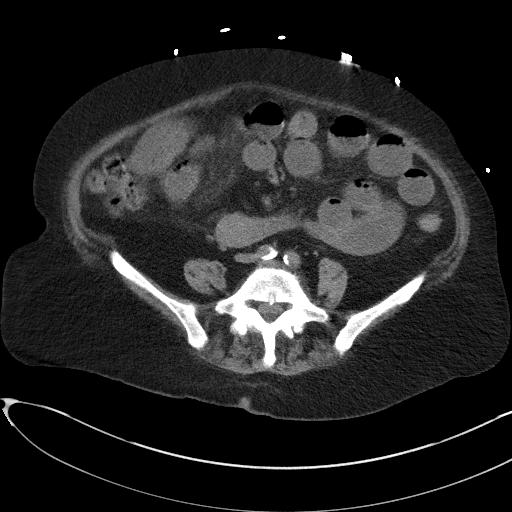
[im 50/99  soft-tissue]
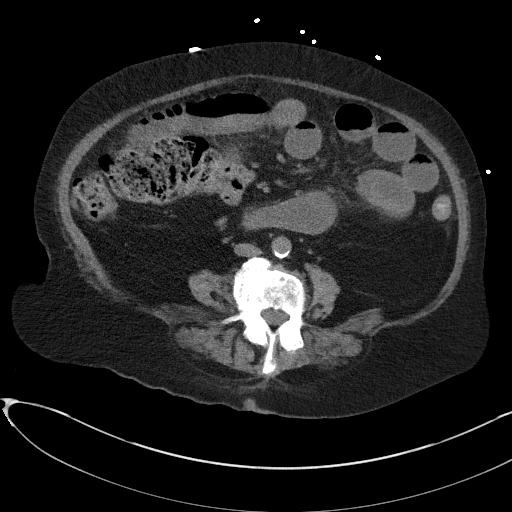
[im 55/99  soft-tissue]
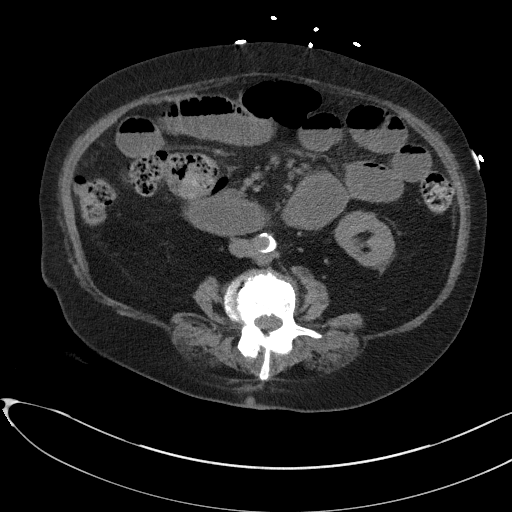
[im 66/99  soft-tissue]
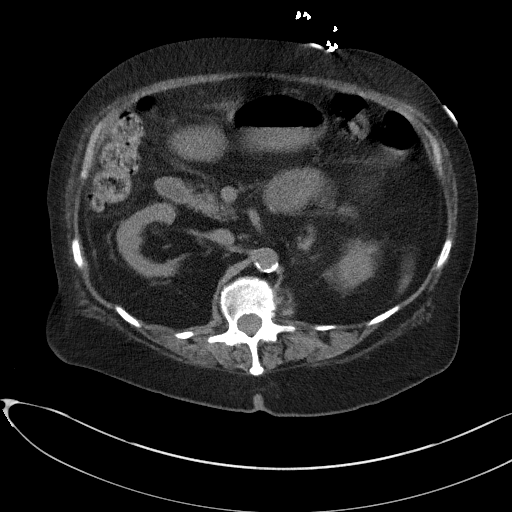
[im 66/99  bone]
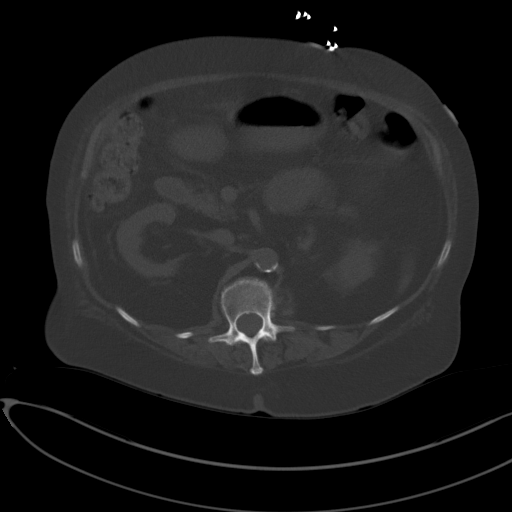
[im 71/99  soft-tissue]
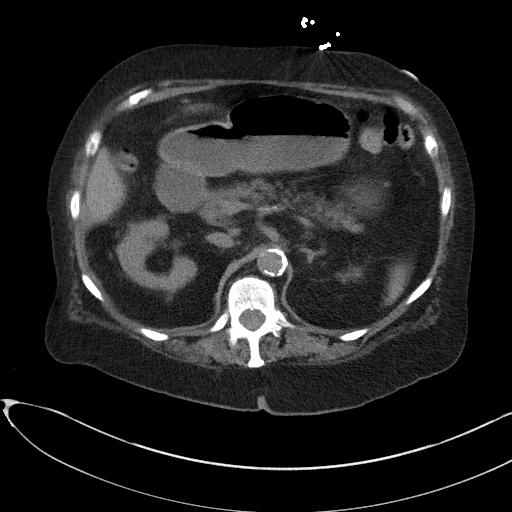
[im 77/99  soft-tissue]
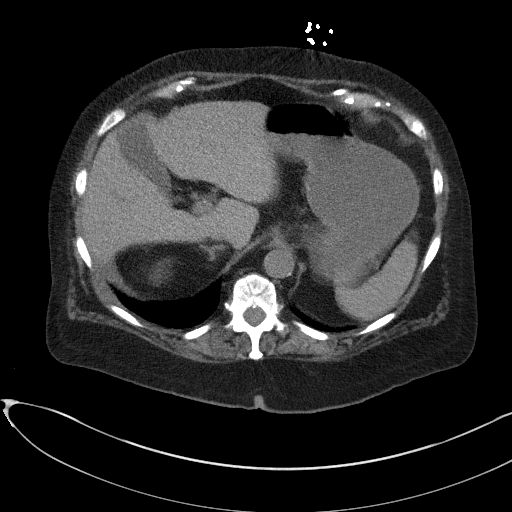
[im 88/99  soft-tissue]
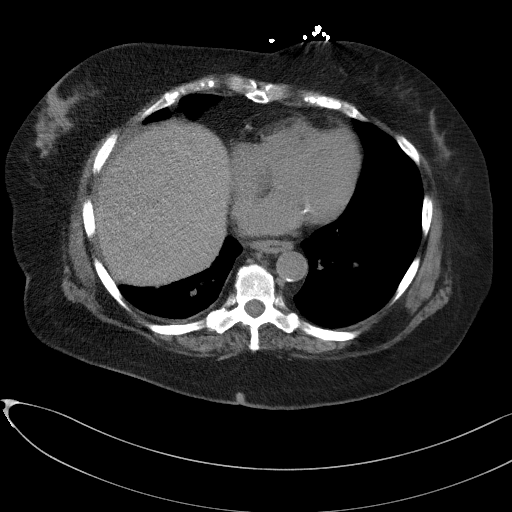
[im 93/99  soft-tissue]
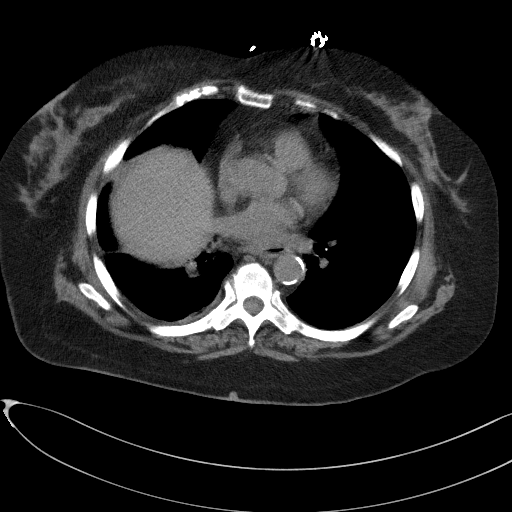

[Series 5: coronal st · coronal · 0.87mm/px · 3 of 107 slices shown]
[im 36/107  soft-tissue]
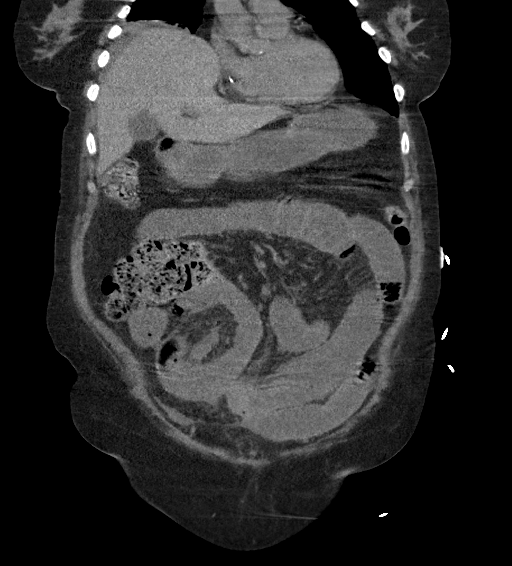
[im 48/107  soft-tissue]
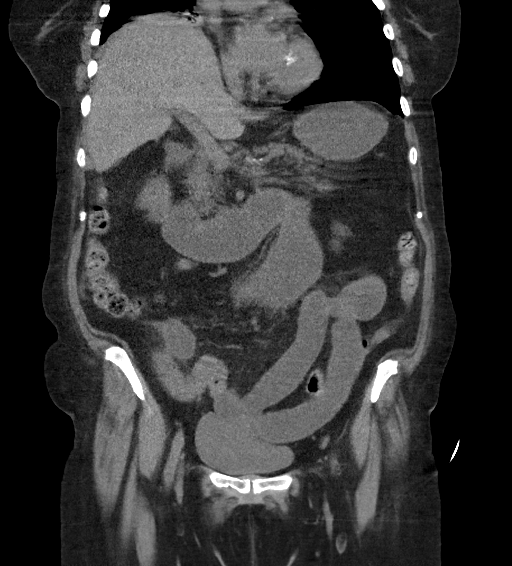
[im 59/107  soft-tissue]
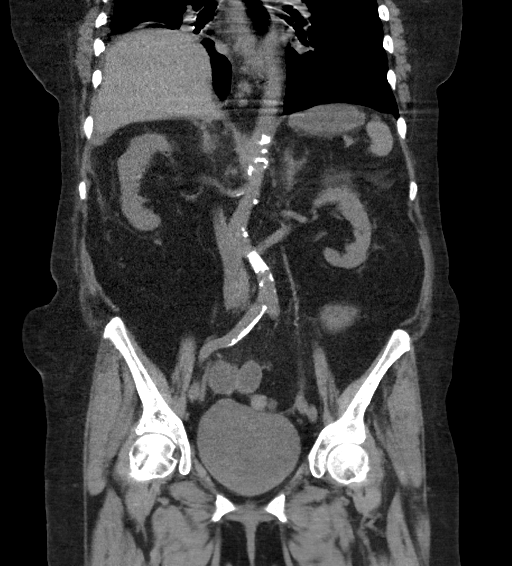

[16 of 46 positions shown; findings below may reference images not displayed]

FINDINGS: Lower chest: No acute abnormality.

Hepatobiliary: No focal liver abnormality is seen. No gallstones,
gallbladder wall thickening, or biliary dilatation.

Pancreas: Unremarkable. No pancreatic ductal dilatation or
surrounding inflammatory changes.

Spleen: Normal in size without focal abnormality.

Adrenals/Urinary Tract: Adrenal glands are unremarkable. Kidneys are
normal, without renal calculi, focal lesion, or hydronephrosis.
Bladder is unremarkable.

Stomach/Bowel: Mild gastric distention is noted. Moderate to severe
small bowel dilatation is noted. Definite transition zone is not
identified, although most likely is in the pelvis as there is
nondilated ileum in this area. The appendix is unremarkable. No
colonic dilatation is noted. Mild amount of stool is seen throughout
the colon.

Vascular/Lymphatic: Aortic atherosclerosis. No enlarged abdominal or
pelvic lymph nodes.

Reproductive: Status post hysterectomy. No adnexal masses.

Other: No abdominal wall hernia or abnormality. No abdominopelvic
ascites.

Musculoskeletal: No acute or significant osseous findings.
IMPRESSION: 1. Moderate to severe small bowel dilatation is noted concerning for
distal small bowel obstruction. Definite transition zone is not
identified, although most likely is in the pelvis as there is
nondilated ileum in this area.

Aortic Atherosclerosis ([id number]).

## 2023-03-24 DIAGNOSIS — H353131 Nonexudative age-related macular degeneration, bilateral, early dry stage: Secondary | ICD-10-CM | POA: Diagnosis not present

## 2023-05-20 ENCOUNTER — Emergency Department (HOSPITAL_COMMUNITY): Payer: Medicare Other

## 2023-05-20 ENCOUNTER — Other Ambulatory Visit: Payer: Self-pay

## 2023-05-20 ENCOUNTER — Emergency Department (HOSPITAL_COMMUNITY)
Admission: EM | Admit: 2023-05-20 | Discharge: 2023-05-20 | Disposition: A | Discharge status: Home / Self Care | Payer: Medicare Other | Attending: Emergency Medicine | Admitting: Emergency Medicine

## 2023-05-20 ENCOUNTER — Encounter (HOSPITAL_COMMUNITY): Payer: Self-pay | Admitting: *Deleted

## 2023-05-20 DIAGNOSIS — R109 Unspecified abdominal pain: Secondary | ICD-10-CM | POA: Insufficient documentation

## 2023-05-20 DIAGNOSIS — M542 Cervicalgia: Secondary | ICD-10-CM | POA: Insufficient documentation

## 2023-05-20 DIAGNOSIS — M549 Dorsalgia, unspecified: Secondary | ICD-10-CM | POA: Diagnosis not present

## 2023-05-20 DIAGNOSIS — R531 Weakness: Secondary | ICD-10-CM | POA: Insufficient documentation

## 2023-05-20 DIAGNOSIS — I10 Essential (primary) hypertension: Secondary | ICD-10-CM | POA: Diagnosis not present

## 2023-05-20 DIAGNOSIS — M5030 Other cervical disc degeneration, unspecified cervical region: Secondary | ICD-10-CM | POA: Diagnosis not present

## 2023-05-20 DIAGNOSIS — R9089 Other abnormal findings on diagnostic imaging of central nervous system: Secondary | ICD-10-CM | POA: Diagnosis not present

## 2023-05-20 DIAGNOSIS — M62838 Other muscle spasm: Secondary | ICD-10-CM | POA: Diagnosis not present

## 2023-05-20 DIAGNOSIS — R197 Diarrhea, unspecified: Secondary | ICD-10-CM | POA: Diagnosis not present

## 2023-05-20 DIAGNOSIS — M4312 Spondylolisthesis, cervical region: Secondary | ICD-10-CM | POA: Diagnosis not present

## 2023-05-20 DIAGNOSIS — R4182 Altered mental status, unspecified: Secondary | ICD-10-CM | POA: Insufficient documentation

## 2023-05-20 DIAGNOSIS — Z1152 Encounter for screening for COVID-19: Secondary | ICD-10-CM | POA: Diagnosis not present

## 2023-05-20 DIAGNOSIS — K7689 Other specified diseases of liver: Secondary | ICD-10-CM | POA: Diagnosis not present

## 2023-05-20 LAB — BASIC METABOLIC PANEL
Anion gap: 11 (ref 5–15)
BUN: 23 mg/dL (ref 8–23)
CO2: 28 mmol/L (ref 22–32)
Calcium: 9.4 mg/dL (ref 8.9–10.3)
Chloride: 100 mmol/L (ref 98–111)
Creatinine, Ser: 1 mg/dL (ref 0.44–1.00)
GFR, Estimated: 55 mL/min — ABNORMAL LOW (ref >60)
Glucose, Bld: 116 mg/dL — ABNORMAL HIGH (ref 70–99)
Potassium: 3.6 mmol/L (ref 3.5–5.1)
Sodium: 139 mmol/L (ref 135–145)

## 2023-05-20 LAB — URINALYSIS, ROUTINE W REFLEX MICROSCOPIC
Bacteria, UA: NONE SEEN
Bilirubin Urine: NEGATIVE
Glucose, UA: NEGATIVE mg/dL
Hgb urine dipstick: NEGATIVE
Ketones, ur: NEGATIVE mg/dL
Leukocytes,Ua: NEGATIVE
Nitrite: NEGATIVE
Protein, ur: 30 mg/dL — AB
Specific Gravity, Urine: 1.015 (ref 1.005–1.030)
pH: 7 (ref 5.0–8.0)

## 2023-05-20 LAB — SARS CORONAVIRUS 2 BY RT PCR: SARS Coronavirus 2 by RT PCR: NEGATIVE

## 2023-05-20 LAB — CBC
HCT: 35.4 % — ABNORMAL LOW (ref 36.0–46.0)
Hemoglobin: 11.1 g/dL — ABNORMAL LOW (ref 12.0–15.0)
MCH: 28.5 pg (ref 26.0–34.0)
MCHC: 31.4 g/dL (ref 30.0–36.0)
MCV: 91 fL (ref 80.0–100.0)
Platelets: 257 10*3/uL (ref 150–400)
RBC: 3.89 MIL/uL (ref 3.87–5.11)
RDW: 15.5 % (ref 11.5–15.5)
WBC: 6.4 10*3/uL (ref 4.0–10.5)
nRBC: 0 % (ref 0.0–0.2)

## 2023-05-20 MED ORDER — METHOCARBAMOL 500 MG PO TABS: 500.0000 mg | ORAL_TABLET | Freq: Two times a day (BID) | ORAL | 0 refills | Status: DC

## 2023-05-20 MED ORDER — METHOCARBAMOL 500 MG PO TABS: 500.0000 mg | ORAL_TABLET | Freq: Three times a day (TID) | ORAL | 0 refills | Status: DC | PRN

## 2023-05-20 MED ORDER — LOPERAMIDE HCL 2 MG PO CAPS: 2.0000 mg | ORAL_CAPSULE | Freq: Four times a day (QID) | ORAL | 0 refills | Status: DC | PRN

## 2023-05-20 MED ORDER — IOHEXOL 300 MG/ML  SOLN
100.0000 mL | Freq: Once | INTRAMUSCULAR | Status: AC | PRN
  Administered 2023-05-20: 100 mL via INTRAVENOUS

## 2023-05-20 NOTE — ED Provider Notes (Signed)
[place] EMERGENCY DEPARTMENT AT [facility] Provider Note   CSN: [id number] Arrival date & time: 05/20/23  1207     History  Chief Complaint  Patient presents with   Weakness    Laurie Mcfarland is a 88 y.o. female.  Presents to the ER today from her PCPs office for further evaluation of diarrhea, generalized decline and neck and back pain that has been going on for about a month.  Patient reports she woke up this morning with some mild lower abdominal pain and had diarrhea, went to her regular PCPs office and they advised she come here denies any vomiting, denies fever or chills, states she due to having pain in her neck for the past month is having trouble straightening her neck and having pain when she turns it to the left.  Denies injury or trauma, no numbness tingling or weakness.  She lives at [facility] assisted living.  Also spoke with her daughter who confirmed the above history, noted diarrhea was X 1 month .  Apparently she has had fairly rapid decline over the past month since her friend at the nursing facility had to be moved out of her area, he actually ultimately died, but patient does not know and family does not want her to do this.   Weakness      Home Medications Prior to Admission medications   Medication Sig Start Date End Date Taking? Authorizing Provider  acetaminophen (TYLENOL) 650 MG CR tablet Take 650 mg by mouth 3 (three) times daily.    [provider]  albuterol (VENTOLIN HFA) 108 (90 Base) MCG/ACT inhaler Inhale 2 puffs into the lungs every 4 (four) hours as needed.  03/28/20   [provider]  ALPRAZolam Prudy Feeler) 0.5 MG tablet Take 1 tablet (0.5 mg total) by mouth 3 (three) times daily as needed. 03/28/20   Vassie Loll, MD  atorvastatin (LIPITOR) 20 MG tablet Take 1 tablet (20 mg total) by mouth daily. 03/29/20   Vassie Loll, MD  busPIRone (BUSPAR) 10 MG tablet Take 10 mg by mouth 2 (two) times daily. 03/17/20   [provider]  Cholecalciferol (VITAMIN D-3) 1000 UNITS CAPS Take 1 capsule by mouth 2 (two) times daily.     [provider]  doxycycline (MONODOX) 50 MG capsule Take 50 mg by mouth daily.    [provider]  gabapentin (NEURONTIN) 300 MG capsule Take 1 capsule (300 mg total) by mouth 3 (three) times daily. 03/28/20   Vassie Loll, MD  Lidocaine HCl 4 % PTCH Apply 1 patch topically daily. Apply to lower back daily and remove at [facility] for chronic back pain. 03/29/20   [provider]  losartan (COZAAR) 25 MG tablet Take 0.5 tablets (12.5 mg total) by mouth daily. 03/28/20   Vassie Loll, MD  memantine (NAMENDA) 10 MG tablet Take 10 mg by mouth 2 (two) times daily. 03/14/20   [provider]  mirtazapine (REMERON) 30 MG tablet Take 30 mg by mouth at bedtime.  01/14/18   [provider]  Multiple Vitamin (MULTIVITAMIN WITH MINERALS) TABS tablet Take 1 tablet by mouth daily.    [provider]  omeprazole (PRILOSEC) 40 MG capsule Take 1 capsule (40 mg total) by mouth 2 (two) times daily. 07/20/19   Vassie Loll, MD  oxyCODONE (ROXICODONE) 5 MG immediate release tablet Take 1 tablet (5 mg total) by mouth in the morning and at bedtime. Patient taking differently: Take 5 mg by mouth 4 (  four) times daily as needed.  04/06/20   Sharee Holster, NP  primidone (MYSOLINE) 50 MG tablet Take 25 mg by mouth at bedtime. 06/21/19   [provider]      Allergies    Penicillins    Review of Systems   Review of Systems  Neurological:  Positive for weakness.    Physical Exam Updated Vital Signs BP (!) 171/75   Pulse 84   Temp 98.7 F (37.1 C) (Oral)   Resp 15   SpO2 96%  Physical Exam Vitals and nursing note reviewed.  Constitutional:      General: She is not in acute distress.    Appearance: She is well-developed.  HENT:     Head: Normocephalic and atraumatic.     Mouth/Throat:     Mouth: Mucous membranes are moist.  Eyes:      Conjunctiva/sclera: Conjunctivae normal.  Cardiovascular:     Rate and Rhythm: Normal rate and regular rhythm.     Heart sounds: No murmur heard. Pulmonary:     Effort: Pulmonary effort is normal. No respiratory distress.     Breath sounds: Normal breath sounds.  Abdominal:     Palpations: Abdomen is soft.     Tenderness: There is no abdominal tenderness.  Musculoskeletal:        General: No swelling.     Cervical back: Neck supple.  Skin:    General: Skin is warm and dry.     Capillary Refill: Capillary refill takes less than 2 seconds.  Neurological:     General: No focal deficit present.     Mental Status: She is alert and oriented to person, place, and time.  Psychiatric:        Mood and Affect: Mood normal.     ED Results / Procedures / Treatments   Labs (all labs ordered are listed, but only abnormal results are displayed) Labs Reviewed  BASIC METABOLIC PANEL - Abnormal; Notable for the following components:      Result Value   Glucose, Bld 116 (*)    GFR, Estimated 55 (*)    All other components within normal limits  CBC - Abnormal; Notable for the following components:   Hemoglobin 11.1 (*)    HCT 35.4 (*)    All other components within normal limits  URINALYSIS, ROUTINE W REFLEX MICROSCOPIC  CBG MONITORING, ED    EKG None  Radiology No results found.  Procedures Procedures    Medications Ordered in ED Medications - No data to display  ED Course/ Medical Decision Making/ A&P                                 Medical Decision Making DDx: UTI, diverticulitis, diverticulosis, colitis, muscle strain, CVA, other  Course: Patient's daughter is worried that patient had decline over the past month, having trouble lifting her head up due to some neck pain, also having diarrhea had abdominal pain this morning, patient has some very mild lower abdominal tenderness on exam, appears well, does have left lateral neck pain on palpation, no extremity weakness or  numbness.  No focal neurologic findings. CBC and BMP ordered these are overall reassuring.  Awaiting urinalysis.  When persistent diarrhea with pain this morning will get CT to rule out colitis or other acute process the patient appears well overall.  Did get a CT of her head and C-spine due to her decline and abnormal  neck positioning.  Does have degenerative changes her neck but no acute findings, no acute findings on head CT.  Awaiting urinalysis and CT abdomen pelvis at this time.  Her daughter is on her way and will be transporting her back to nursing home pending discharge.  Discussed with daughter I feel the neck pain is likely muscle spasm she has good range of motion, no nuchal rigidity, no fevers or headaches to suggest meningitis.  Pain is primarily when she turns her head to the left.  Would likely benefit from some muscle relaxers and PCP follow-up.  Signed out to Burgess Amor, PA-C  Amount and/or Complexity of Data Reviewed Labs: ordered. Radiology: ordered.  Risk Prescription drug management.           Final Clinical Impression(s) / ED Diagnoses Final diagnoses:  None    Rx / DC Orders ED Discharge Orders     None         Josem Kaufmann 05/20/23 1933    Glendora Score, MD 05/21/23 [phone]

## 2023-05-20 NOTE — ED Provider Notes (Signed)
Patient signed out to me at shift change by Laurie Sacramento, PA-C, pending urinalysis and CT imaging secondary to lower abdominal pain and diarrhea.  Also presenting with some chronic cervical spine pain and reduced range of motion , Ct imaging negative for acute injury but has some djd of c spine.  Will prescribe robaxin, heat tx discussed.  Ct abd/pelvis negative for acute findings. UA negative.  Will prescribe robaxin, dg at bedside also enquiring about brace for neck.  Suggested soft neck support from any medical supply.  Robxin prescribed.  Imodium prn if needed for diarrhea. Pt did not have diarrhea while here.   Burgess Amor, PA-C 05/20/23 2049    Glendora Score, MD 05/21/23 [phone]

## 2023-05-20 NOTE — ED Notes (Signed)
Straight cath for Urine with RN standby

## 2023-05-20 NOTE — ED Notes (Signed)
Introduced self to pt Pt stated she woke up this morning with ABD pain Denies n/v/d Stated she had to have BM, pt up to bedside commode with caregiver  Caregiver at bedside Pt came from [facility] Pt was at PCP today for diarrhea, holding neck down and weakness ongoing for at least 4 weeks.   Stepped out of room for pt to use toilet.  Pt remains attached to full monitor

## 2023-05-20 NOTE — ED Notes (Signed)
Attempted to use bedpan for UA Pt unable to provide sample

## 2023-05-20 NOTE — ED Triage Notes (Signed)
Pt from [facility] and sent from PCP's office.  Pt with generalized weakness and urine incont.  Pt leaning her head forward which is new per caregiver.

## 2023-05-20 NOTE — ED Notes (Signed)
Pt sleeping comfortably at this time  Remains attached to full monitor  Vitals listed

## 2023-05-22 DIAGNOSIS — E1122 Type 2 diabetes mellitus with diabetic chronic kidney disease: Secondary | ICD-10-CM | POA: Diagnosis not present

## 2023-05-22 DIAGNOSIS — Z6824 Body mass index (BMI) 24.0-24.9, adult: Secondary | ICD-10-CM | POA: Diagnosis not present

## 2023-05-22 DIAGNOSIS — M6281 Muscle weakness (generalized): Secondary | ICD-10-CM | POA: Diagnosis not present

## 2023-05-22 DIAGNOSIS — G894 Chronic pain syndrome: Secondary | ICD-10-CM | POA: Diagnosis not present

## 2023-05-22 DIAGNOSIS — M542 Cervicalgia: Secondary | ICD-10-CM | POA: Diagnosis not present

## 2023-05-23 DIAGNOSIS — M542 Cervicalgia: Secondary | ICD-10-CM | POA: Diagnosis not present

## 2023-05-23 DIAGNOSIS — G894 Chronic pain syndrome: Secondary | ICD-10-CM | POA: Diagnosis not present

## 2023-05-23 DIAGNOSIS — E1140 Type 2 diabetes mellitus with diabetic neuropathy, unspecified: Secondary | ICD-10-CM | POA: Diagnosis not present

## 2023-05-23 DIAGNOSIS — I10 Essential (primary) hypertension: Secondary | ICD-10-CM | POA: Diagnosis not present

## 2023-05-23 DIAGNOSIS — M4145 Neuromuscular scoliosis, thoracolumbar region: Secondary | ICD-10-CM | POA: Diagnosis not present

## 2023-06-03 DIAGNOSIS — G894 Chronic pain syndrome: Secondary | ICD-10-CM | POA: Diagnosis not present

## 2023-06-03 DIAGNOSIS — I10 Essential (primary) hypertension: Secondary | ICD-10-CM | POA: Diagnosis not present

## 2023-06-03 DIAGNOSIS — E1140 Type 2 diabetes mellitus with diabetic neuropathy, unspecified: Secondary | ICD-10-CM | POA: Diagnosis not present

## 2023-06-03 DIAGNOSIS — M542 Cervicalgia: Secondary | ICD-10-CM | POA: Diagnosis not present

## 2023-06-03 DIAGNOSIS — E1122 Type 2 diabetes mellitus with diabetic chronic kidney disease: Secondary | ICD-10-CM | POA: Diagnosis not present

## 2023-06-03 DIAGNOSIS — N189 Chronic kidney disease, unspecified: Secondary | ICD-10-CM | POA: Diagnosis not present

## 2023-06-05 DIAGNOSIS — E1122 Type 2 diabetes mellitus with diabetic chronic kidney disease: Secondary | ICD-10-CM | POA: Diagnosis not present

## 2023-06-05 DIAGNOSIS — E1140 Type 2 diabetes mellitus with diabetic neuropathy, unspecified: Secondary | ICD-10-CM | POA: Diagnosis not present

## 2023-06-05 DIAGNOSIS — G894 Chronic pain syndrome: Secondary | ICD-10-CM | POA: Diagnosis not present

## 2023-06-05 DIAGNOSIS — N189 Chronic kidney disease, unspecified: Secondary | ICD-10-CM | POA: Diagnosis not present

## 2023-06-05 DIAGNOSIS — I10 Essential (primary) hypertension: Secondary | ICD-10-CM | POA: Diagnosis not present

## 2023-06-05 DIAGNOSIS — M542 Cervicalgia: Secondary | ICD-10-CM | POA: Diagnosis not present

## 2023-06-09 DIAGNOSIS — M542 Cervicalgia: Secondary | ICD-10-CM | POA: Diagnosis not present

## 2023-06-09 DIAGNOSIS — G894 Chronic pain syndrome: Secondary | ICD-10-CM | POA: Diagnosis not present

## 2023-06-09 DIAGNOSIS — N189 Chronic kidney disease, unspecified: Secondary | ICD-10-CM | POA: Diagnosis not present

## 2023-06-09 DIAGNOSIS — E1140 Type 2 diabetes mellitus with diabetic neuropathy, unspecified: Secondary | ICD-10-CM | POA: Diagnosis not present

## 2023-06-09 DIAGNOSIS — I10 Essential (primary) hypertension: Secondary | ICD-10-CM | POA: Diagnosis not present

## 2023-06-09 DIAGNOSIS — E1122 Type 2 diabetes mellitus with diabetic chronic kidney disease: Secondary | ICD-10-CM | POA: Diagnosis not present

## 2023-06-10 DIAGNOSIS — M542 Cervicalgia: Secondary | ICD-10-CM | POA: Diagnosis not present

## 2023-06-10 DIAGNOSIS — E1140 Type 2 diabetes mellitus with diabetic neuropathy, unspecified: Secondary | ICD-10-CM | POA: Diagnosis not present

## 2023-06-10 DIAGNOSIS — G894 Chronic pain syndrome: Secondary | ICD-10-CM | POA: Diagnosis not present

## 2023-06-10 DIAGNOSIS — E1122 Type 2 diabetes mellitus with diabetic chronic kidney disease: Secondary | ICD-10-CM | POA: Diagnosis not present

## 2023-06-11 DIAGNOSIS — E1122 Type 2 diabetes mellitus with diabetic chronic kidney disease: Secondary | ICD-10-CM | POA: Diagnosis not present

## 2023-06-11 DIAGNOSIS — M542 Cervicalgia: Secondary | ICD-10-CM | POA: Diagnosis not present

## 2023-06-11 DIAGNOSIS — G894 Chronic pain syndrome: Secondary | ICD-10-CM | POA: Diagnosis not present

## 2023-06-11 DIAGNOSIS — N189 Chronic kidney disease, unspecified: Secondary | ICD-10-CM | POA: Diagnosis not present

## 2023-06-11 DIAGNOSIS — E1140 Type 2 diabetes mellitus with diabetic neuropathy, unspecified: Secondary | ICD-10-CM | POA: Diagnosis not present

## 2023-06-11 DIAGNOSIS — I10 Essential (primary) hypertension: Secondary | ICD-10-CM | POA: Diagnosis not present

## 2023-06-18 DIAGNOSIS — I10 Essential (primary) hypertension: Secondary | ICD-10-CM | POA: Diagnosis not present

## 2023-06-18 DIAGNOSIS — N189 Chronic kidney disease, unspecified: Secondary | ICD-10-CM | POA: Diagnosis not present

## 2023-06-18 DIAGNOSIS — E1122 Type 2 diabetes mellitus with diabetic chronic kidney disease: Secondary | ICD-10-CM | POA: Diagnosis not present

## 2023-06-18 DIAGNOSIS — G894 Chronic pain syndrome: Secondary | ICD-10-CM | POA: Diagnosis not present

## 2023-06-18 DIAGNOSIS — M542 Cervicalgia: Secondary | ICD-10-CM | POA: Diagnosis not present

## 2023-06-18 DIAGNOSIS — E1140 Type 2 diabetes mellitus with diabetic neuropathy, unspecified: Secondary | ICD-10-CM | POA: Diagnosis not present

## 2023-06-23 DIAGNOSIS — I10 Essential (primary) hypertension: Secondary | ICD-10-CM | POA: Diagnosis not present

## 2023-06-23 DIAGNOSIS — M542 Cervicalgia: Secondary | ICD-10-CM | POA: Diagnosis not present

## 2023-06-23 DIAGNOSIS — N189 Chronic kidney disease, unspecified: Secondary | ICD-10-CM | POA: Diagnosis not present

## 2023-06-23 DIAGNOSIS — G894 Chronic pain syndrome: Secondary | ICD-10-CM | POA: Diagnosis not present

## 2023-06-23 DIAGNOSIS — E1122 Type 2 diabetes mellitus with diabetic chronic kidney disease: Secondary | ICD-10-CM | POA: Diagnosis not present

## 2023-06-23 DIAGNOSIS — E1140 Type 2 diabetes mellitus with diabetic neuropathy, unspecified: Secondary | ICD-10-CM | POA: Diagnosis not present

## 2023-06-30 DIAGNOSIS — E1122 Type 2 diabetes mellitus with diabetic chronic kidney disease: Secondary | ICD-10-CM | POA: Diagnosis not present

## 2023-06-30 DIAGNOSIS — N189 Chronic kidney disease, unspecified: Secondary | ICD-10-CM | POA: Diagnosis not present

## 2023-06-30 DIAGNOSIS — M542 Cervicalgia: Secondary | ICD-10-CM | POA: Diagnosis not present

## 2023-06-30 DIAGNOSIS — I10 Essential (primary) hypertension: Secondary | ICD-10-CM | POA: Diagnosis not present

## 2023-06-30 DIAGNOSIS — E1140 Type 2 diabetes mellitus with diabetic neuropathy, unspecified: Secondary | ICD-10-CM | POA: Diagnosis not present

## 2023-06-30 DIAGNOSIS — G894 Chronic pain syndrome: Secondary | ICD-10-CM | POA: Diagnosis not present

## 2023-07-01 DIAGNOSIS — M6281 Muscle weakness (generalized): Secondary | ICD-10-CM | POA: Diagnosis not present

## 2023-07-03 DIAGNOSIS — M6281 Muscle weakness (generalized): Secondary | ICD-10-CM | POA: Diagnosis not present

## 2023-07-08 DIAGNOSIS — M6281 Muscle weakness (generalized): Secondary | ICD-10-CM | POA: Diagnosis not present

## 2023-07-09 DIAGNOSIS — M6281 Muscle weakness (generalized): Secondary | ICD-10-CM | POA: Diagnosis not present

## 2023-07-14 DIAGNOSIS — M6281 Muscle weakness (generalized): Secondary | ICD-10-CM | POA: Diagnosis not present

## 2023-07-16 DIAGNOSIS — M6281 Muscle weakness (generalized): Secondary | ICD-10-CM | POA: Diagnosis not present

## 2023-07-25 ENCOUNTER — Emergency Department (HOSPITAL_COMMUNITY)
Admission: EM | Admit: 2023-07-25 | Discharge: 2023-07-25 | Disposition: A | Discharge status: Home / Self Care | Payer: Medicare Other | Attending: Emergency Medicine | Admitting: Emergency Medicine

## 2023-07-25 ENCOUNTER — Encounter (HOSPITAL_COMMUNITY): Payer: Self-pay

## 2023-07-25 ENCOUNTER — Emergency Department (HOSPITAL_COMMUNITY): Payer: Medicare Other

## 2023-07-25 ENCOUNTER — Other Ambulatory Visit: Payer: Self-pay

## 2023-07-25 DIAGNOSIS — R5383 Other fatigue: Secondary | ICD-10-CM | POA: Diagnosis not present

## 2023-07-25 DIAGNOSIS — R9389 Abnormal findings on diagnostic imaging of other specified body structures: Secondary | ICD-10-CM | POA: Diagnosis not present

## 2023-07-25 DIAGNOSIS — R6889 Other general symptoms and signs: Secondary | ICD-10-CM | POA: Diagnosis not present

## 2023-07-25 DIAGNOSIS — R531 Weakness: Secondary | ICD-10-CM | POA: Insufficient documentation

## 2023-07-25 DIAGNOSIS — I6782 Cerebral ischemia: Secondary | ICD-10-CM | POA: Diagnosis not present

## 2023-07-25 DIAGNOSIS — Z1152 Encounter for screening for COVID-19: Secondary | ICD-10-CM | POA: Insufficient documentation

## 2023-07-25 DIAGNOSIS — R5381 Other malaise: Secondary | ICD-10-CM | POA: Diagnosis not present

## 2023-07-25 DIAGNOSIS — Z743 Need for continuous supervision: Secondary | ICD-10-CM | POA: Diagnosis not present

## 2023-07-25 DIAGNOSIS — R140 Abdominal distension (gaseous): Secondary | ICD-10-CM | POA: Diagnosis not present

## 2023-07-25 LAB — CBC WITH DIFFERENTIAL/PLATELET
Abs Immature Granulocytes: 0.01 10*3/uL (ref 0.00–0.07)
Basophils Absolute: 0 10*3/uL (ref 0.0–0.1)
Basophils Relative: 0 %
Eosinophils Absolute: 0 10*3/uL (ref 0.0–0.5)
Eosinophils Relative: 1 %
HCT: 35.9 % — ABNORMAL LOW (ref 36.0–46.0)
Hemoglobin: 11.7 g/dL — ABNORMAL LOW (ref 12.0–15.0)
Immature Granulocytes: 0 %
Lymphocytes Relative: 33 %
Lymphs Abs: 2.1 10*3/uL (ref 0.7–4.0)
MCH: 29.8 pg (ref 26.0–34.0)
MCHC: 32.6 g/dL (ref 30.0–36.0)
MCV: 91.3 fL (ref 80.0–100.0)
Monocytes Absolute: 0.4 10*3/uL (ref 0.1–1.0)
Monocytes Relative: 6 %
Neutro Abs: 3.8 10*3/uL (ref 1.7–7.7)
Neutrophils Relative %: 60 %
Platelets: 230 10*3/uL (ref 150–400)
RBC: 3.93 MIL/uL (ref 3.87–5.11)
RDW: 15.1 % (ref 11.5–15.5)
WBC: 6.3 10*3/uL (ref 4.0–10.5)
nRBC: 0 % (ref 0.0–0.2)

## 2023-07-25 LAB — RESP PANEL BY RT-PCR (RSV, FLU A&B, COVID)  RVPGX2
Influenza A by PCR: NEGATIVE
Influenza B by PCR: NEGATIVE
Resp Syncytial Virus by PCR: NEGATIVE
SARS Coronavirus 2 by RT PCR: NEGATIVE

## 2023-07-25 LAB — TROPONIN I (HIGH SENSITIVITY)
Troponin I (High Sensitivity): 6 ng/L (ref <18)
Troponin I (High Sensitivity): 6 ng/L (ref <18)

## 2023-07-25 LAB — URINALYSIS, ROUTINE W REFLEX MICROSCOPIC
Bilirubin Urine: NEGATIVE
Glucose, UA: NEGATIVE mg/dL
Hgb urine dipstick: NEGATIVE
Ketones, ur: NEGATIVE mg/dL
Leukocytes,Ua: NEGATIVE
Nitrite: NEGATIVE
Protein, ur: NEGATIVE mg/dL
Specific Gravity, Urine: 1.016 (ref 1.005–1.030)
pH: 6 (ref 5.0–8.0)

## 2023-07-25 LAB — BASIC METABOLIC PANEL
Anion gap: 10 (ref 5–15)
BUN: 21 mg/dL (ref 8–23)
CO2: 28 mmol/L (ref 22–32)
Calcium: 9 mg/dL (ref 8.9–10.3)
Chloride: 98 mmol/L (ref 98–111)
Creatinine, Ser: 0.87 mg/dL (ref 0.44–1.00)
GFR, Estimated: >60 mL/min (ref >60)
Glucose, Bld: 108 mg/dL — ABNORMAL HIGH (ref 70–99)
Potassium: 3.7 mmol/L (ref 3.5–5.1)
Sodium: 136 mmol/L (ref 135–145)

## 2023-07-25 MED ORDER — SODIUM CHLORIDE 0.9 % IV BOLUS
1000.0000 mL | Freq: Once | INTRAVENOUS | Status: AC
  Administered 2023-07-25: 1000 mL via INTRAVENOUS

## 2023-07-25 NOTE — Discharge Instructions (Signed)
Workup without any acute findings.  No evidence of any urinary tract infection head CT negative.  Labs without significant abnormalities.  Blood pressures have been stable here.  Patient stable to return back to hide graft.  Return for any new or worse symptoms.

## 2023-07-25 NOTE — ED Triage Notes (Signed)
Pt bib ems from [facility] for generalized fatigue, per ems pt normally more active and walking, but endorses not feeling well since yesterday.  Hx of dementia.

## 2023-07-25 NOTE — ED Provider Notes (Signed)
[place] EMERGENCY DEPARTMENT AT [facility] Provider Note   CSN: [id number] Arrival date & time: 07/25/23  1058     History  Chief Complaint  Patient presents with   Fatigue    Pt bib ems from [facility] for generalized fatigue, per ems pt normally more active and walking, but endorses not feeling well since yesterday.  Hx of dementia.    LATRISH GILLIAND is a 88 y.o. female.  She is brought in from her facility for evaluation of general fatigue since yesterday.  Patient endorses being cold and having pain all over.  History of fibromyalgia and is on chronic narcotics she denies any cough chest pain abdominal pain vomiting diarrhea or urinary symptoms.  She denies any recent falls.  She does not think she has had a fever.  The history is provided by the patient and the EMS personnel.  Weakness Onset quality:  Gradual Duration:  2 days Timing:  Constant Progression:  Unchanged Worsened by:  Activity Associated symptoms: no abdominal pain, no chest pain, no cough, no diarrhea, no dysuria, no falls, no fever, no headaches, no nausea, no shortness of breath and no vomiting        Home Medications Prior to Admission medications   Medication Sig Start Date End Date Taking? Authorizing Provider  acetaminophen (TYLENOL) 650 MG CR tablet Take 650 mg by mouth 3 (three) times daily.    [provider]  albuterol (VENTOLIN HFA) 108 (90 Base) MCG/ACT inhaler Inhale 2 puffs into the lungs every 4 (four) hours as needed.  03/28/20   [provider]  ALPRAZolam Prudy Feeler) 0.5 MG tablet Take 1 tablet (0.5 mg total) by mouth 3 (three) times daily as needed. 03/28/20   Vassie Loll, MD  atorvastatin (LIPITOR) 20 MG tablet Take 1 tablet (20 mg total) by mouth daily. 03/29/20   Vassie Loll, MD  busPIRone (BUSPAR) 10 MG tablet Take 10 mg by mouth 2 (two) times daily. 03/17/20   [provider]  Cholecalciferol (VITAMIN D-3) 1000 UNITS CAPS Take 1 capsule by mouth 2  (two) times daily.     [provider]  doxycycline (MONODOX) 50 MG capsule Take 50 mg by mouth daily.    [provider]  gabapentin (NEURONTIN) 300 MG capsule Take 1 capsule (300 mg total) by mouth 3 (three) times daily. 03/28/20   Vassie Loll, MD  Lidocaine HCl 4 % PTCH Apply 1 patch topically daily. Apply to lower back daily and remove at [facility] for chronic back pain. 03/29/20   [provider]  loperamide (IMODIUM) 2 MG capsule Take 1 capsule (2 mg total) by mouth 4 (four) times daily as needed for diarrhea or loose stools. 05/20/23   Burgess Amor, PA-C  losartan (COZAAR) 25 MG tablet Take 0.5 tablets (12.5 mg total) by mouth daily. 03/28/20   Vassie Loll, MD  memantine (NAMENDA) 10 MG tablet Take 10 mg by mouth 2 (two) times daily. 03/14/20   [provider]  methocarbamol (ROBAXIN) 500 MG tablet Take 1 tablet (500 mg total) by mouth every 8 (eight) hours as needed for muscle spasms. 05/20/23   Carmel Sacramento A, PA-C  methocarbamol (ROBAXIN) 500 MG tablet Take 1 tablet (500 mg total) by mouth 2 (two) times daily. 05/20/23   Burgess Amor, PA-C  mirtazapine (REMERON) 30 MG tablet Take 30 mg by mouth at bedtime.  01/14/18   [provider]  Multiple Vitamin (MULTIVITAMIN WITH MINERALS) TABS tablet Take 1 tablet by mouth daily.  [provider]  omeprazole (PRILOSEC) 40 MG capsule Take 1 capsule (40 mg total) by mouth 2 (two) times daily. 07/20/19   Vassie Loll, MD  oxyCODONE (ROXICODONE) 5 MG immediate release tablet Take 1 tablet (5 mg total) by mouth in the morning and at bedtime. Patient taking differently: Take 5 mg by mouth 4 (four) times daily as needed.  04/06/20   Sharee Holster, NP  primidone (MYSOLINE) 50 MG tablet Take 25 mg by mouth at bedtime. 06/21/19   [provider]      Allergies    Penicillins    Review of Systems   Review of Systems  Constitutional:  Positive for fatigue. Negative for fever.  Respiratory:   Negative for cough and shortness of breath.   Cardiovascular:  Negative for chest pain.  Gastrointestinal:  Negative for abdominal pain, diarrhea, nausea and vomiting.  Genitourinary:  Negative for dysuria.  Musculoskeletal:  Negative for falls.  Neurological:  Positive for weakness. Negative for headaches.    Physical Exam Updated Vital Signs BP (!) 148/71 (BP Location: Left Arm)   Pulse 73   Temp 97.8 F (36.6 C) (Oral)   Resp 18   SpO2 96%  Physical Exam Vitals and nursing note reviewed.  Constitutional:      General: She is not in acute distress.    Appearance: Normal appearance. She is well-developed.  HENT:     Head: Normocephalic and atraumatic.  Eyes:     Conjunctiva/sclera: Conjunctivae normal.  Cardiovascular:     Rate and Rhythm: Normal rate and regular rhythm.     Heart sounds: No murmur heard. Pulmonary:     Effort: Pulmonary effort is normal. No respiratory distress.     Breath sounds: Normal breath sounds.  Abdominal:     Palpations: Abdomen is soft.     Tenderness: There is no abdominal tenderness. There is no guarding or rebound.  Musculoskeletal:        General: No swelling.     Cervical back: Neck supple.  Skin:    General: Skin is warm and dry.     Capillary Refill: Capillary refill takes less than 2 seconds.  Neurological:     General: No focal deficit present.     Mental Status: She is alert.     Sensory: No sensory deficit.     Motor: No weakness.     ED Results / Procedures / Treatments   Labs (all labs ordered are listed, but only abnormal results are displayed) Labs Reviewed  BASIC METABOLIC PANEL - Abnormal; Notable for the following components:      Result Value   Glucose, Bld 108 (*)    All other components within normal limits  CBC WITH DIFFERENTIAL/PLATELET - Abnormal; Notable for the following components:   Hemoglobin 11.7 (*)    HCT 35.9 (*)    All other components within normal limits  RESP PANEL BY RT-PCR (RSV, FLU A&B,  COVID)  [id number]  URINALYSIS, ROUTINE W REFLEX MICROSCOPIC  TROPONIN I (HIGH SENSITIVITY)  TROPONIN I (HIGH SENSITIVITY)    EKG EKG Interpretation Date/Time:  Friday July 25 2023 11:42:16 EST Ventricular Rate:  76 PR Interval:  163 QRS Duration:  88 QT Interval:  385 QTC Calculation: 433 R Axis:   53  Text Interpretation: Sinus rhythm Borderline T abnormalities, lateral leads No significant change since prior 9/24 Confirmed by Meridee Score [phone]) on 07/25/2023 11:45:46 AM  Radiology CT Head Wo Contrast  Result Date: 07/25/2023 CLINICAL DATA:  Mental status change, unknown cause. Generalized fatigue. History of dementia. EXAM: CT HEAD WITHOUT CONTRAST TECHNIQUE: Contiguous axial images were obtained from the base of the skull through the vertex without intravenous contrast. RADIATION DOSE REDUCTION: This exam was performed according to the departmental dose-optimization program which includes automated exposure control, adjustment of the mA and/or kV according to patient size and/or use of iterative reconstruction technique. COMPARISON:  Head CT 05/20/2023 FINDINGS: Brain: There is no evidence of an acute infarct, intracranial hemorrhage, mass, midline shift, or extra-axial fluid collection. Mild cerebral atrophy is unchanged. Cerebral white matter hypodensities are unchanged and nonspecific but compatible with mild chronic small vessel ischemic disease. Vascular: Calcified atherosclerosis at the skull base. No hyperdense vessel. Skull: No acute fracture or suspicious osseous lesion. Sinuses/Orbits: Small left sphenoid sinus mucous retention cyst. Clear mastoid air cells. Bilateral cataract extraction. Other: None. IMPRESSION: 1. No evidence of acute intracranial abnormality. 2. Mild chronic small vessel ischemic disease. Electronically Signed   By: Sebastian Ache M.D.   On: 07/25/2023 16:43   DG Chest Port 1 View  Result Date: 07/25/2023 CLINICAL DATA:  88 year old female with  weakness, malaise. EXAM: PORTABLE CHEST 1 VIEW COMPARISON:  Portable chest 05/12/2020. FINDINGS: Chronic elevation of the right hemidiaphragm, not significantly changed from 2014, and described as far back as 2003). Stable somewhat low lung volumes. Stable cardiac size and mediastinal contours. Allowing for portable technique the lungs are clear. Visualized tracheal air column is within normal limits. No acute osseous abnormality identified. Paucity of bowel gas in the visible abdomen. IMPRESSION: 1. No acute cardiopulmonary abnormality. 2. Chronic elevation of the right hemidiaphragm (since 2003) could be eventration of the diaphragm (normal variant) or chronic phrenic nerve palsy. Electronically Signed   By: Odessa Fleming M.D.   On: 07/25/2023 12:54    Procedures Procedures    Medications Ordered in ED Medications  sodium chloride 0.9 % bolus 1,000 mL (0 mLs Intravenous Stopped 07/25/23 1731)    ED Course/ Medical Decision Making/ A&P                                 Medical Decision Making Amount and/or Complexity of Data Reviewed Labs: ordered. Radiology: ordered.   This patient complains of general weakness; this involves an extensive number of treatment Options and is a complaint that carries with it a high risk of complications and morbidity. The differential includes stroke, bleed, dehydration, infection  I ordered, reviewed and interpreted labs, which included CBC similar to priors, chemistries with mildly elevated glucose, COVID and flu negative, urinalysis negative, troponin unremarkable I ordered medication IV fluids and reviewed PMP when indicated. I ordered imaging studies which included chest x-ray and head CT and I independently    visualized and interpreted imaging which showed no acute findings Additional history obtained from EMS Previous records obtained and reviewed in epic including recent PCP notes Cardiac monitoring reviewed, sinus rhythm Social determinants  considered, no significant barriers Critical Interventions: None  After the interventions stated above, I reevaluated the patient and found patient still to be very tired and weak appearing.  Blood pressure dipped for a little bit but is now back in the normal or above normal range Admission and further testing considered, her care is signed out to Dr. Deretha Emory to follow-up on results of CT and her response to IV fluids.  Currently no indications for admission.  If returns back to facility will need close  observation.         Final Clinical Impression(s) / ED Diagnoses Final diagnoses:  Generalized weakness    Rx / DC Orders ED Discharge Orders     None         Terrilee Files, MD 07/25/23 1750

## 2023-07-25 NOTE — ED Provider Notes (Signed)
Patient's blood pressures remained very stable here.  Patient's head CT without any acute findings and urinalysis without any urinary tract infection.  COVID testing was negative troponins were stable at 6 CBC without significant abnormalities other than hemoglobin 11.7 and basic metabolic panel with normal renal function and normal electrolytes.  Patient stable for discharge back to [facility] nursing facility.   Vanetta Mulders, MD 07/25/23 [phone]

## 2023-08-05 DIAGNOSIS — E538 Deficiency of other specified B group vitamins: Secondary | ICD-10-CM | POA: Diagnosis not present

## 2023-08-05 DIAGNOSIS — M1991 Primary osteoarthritis, unspecified site: Secondary | ICD-10-CM | POA: Diagnosis not present

## 2023-08-05 DIAGNOSIS — Z6822 Body mass index (BMI) 22.0-22.9, adult: Secondary | ICD-10-CM | POA: Diagnosis not present

## 2023-08-14 DIAGNOSIS — R278 Other lack of coordination: Secondary | ICD-10-CM | POA: Diagnosis not present

## 2023-08-14 DIAGNOSIS — M6281 Muscle weakness (generalized): Secondary | ICD-10-CM | POA: Diagnosis not present

## 2023-08-19 DIAGNOSIS — M6281 Muscle weakness (generalized): Secondary | ICD-10-CM | POA: Diagnosis not present

## 2023-08-19 DIAGNOSIS — R2689 Other abnormalities of gait and mobility: Secondary | ICD-10-CM | POA: Diagnosis not present

## 2023-08-19 DIAGNOSIS — G309 Alzheimer's disease, unspecified: Secondary | ICD-10-CM | POA: Diagnosis not present

## 2023-08-21 DIAGNOSIS — R2689 Other abnormalities of gait and mobility: Secondary | ICD-10-CM | POA: Diagnosis not present

## 2023-08-21 DIAGNOSIS — M6281 Muscle weakness (generalized): Secondary | ICD-10-CM | POA: Diagnosis not present

## 2023-08-26 DIAGNOSIS — M6281 Muscle weakness (generalized): Secondary | ICD-10-CM | POA: Diagnosis not present

## 2023-08-26 DIAGNOSIS — R2689 Other abnormalities of gait and mobility: Secondary | ICD-10-CM | POA: Diagnosis not present

## 2023-08-28 DIAGNOSIS — M6281 Muscle weakness (generalized): Secondary | ICD-10-CM | POA: Diagnosis not present

## 2023-08-28 DIAGNOSIS — R2689 Other abnormalities of gait and mobility: Secondary | ICD-10-CM | POA: Diagnosis not present

## 2023-08-31 DIAGNOSIS — R2689 Other abnormalities of gait and mobility: Secondary | ICD-10-CM | POA: Diagnosis not present

## 2023-08-31 DIAGNOSIS — M6281 Muscle weakness (generalized): Secondary | ICD-10-CM | POA: Diagnosis not present

## 2023-09-01 DIAGNOSIS — R278 Other lack of coordination: Secondary | ICD-10-CM | POA: Diagnosis not present

## 2023-09-01 DIAGNOSIS — M6281 Muscle weakness (generalized): Secondary | ICD-10-CM | POA: Diagnosis not present

## 2023-09-02 DIAGNOSIS — M6281 Muscle weakness (generalized): Secondary | ICD-10-CM | POA: Diagnosis not present

## 2023-09-02 DIAGNOSIS — R278 Other lack of coordination: Secondary | ICD-10-CM | POA: Diagnosis not present

## 2023-09-08 DIAGNOSIS — M6281 Muscle weakness (generalized): Secondary | ICD-10-CM | POA: Diagnosis not present

## 2023-09-08 DIAGNOSIS — R278 Other lack of coordination: Secondary | ICD-10-CM | POA: Diagnosis not present

## 2023-09-09 DIAGNOSIS — M6281 Muscle weakness (generalized): Secondary | ICD-10-CM | POA: Diagnosis not present

## 2023-09-09 DIAGNOSIS — R2689 Other abnormalities of gait and mobility: Secondary | ICD-10-CM | POA: Diagnosis not present

## 2023-09-10 DIAGNOSIS — M6281 Muscle weakness (generalized): Secondary | ICD-10-CM | POA: Diagnosis not present

## 2023-09-10 DIAGNOSIS — R278 Other lack of coordination: Secondary | ICD-10-CM | POA: Diagnosis not present

## 2023-09-10 DIAGNOSIS — R2689 Other abnormalities of gait and mobility: Secondary | ICD-10-CM | POA: Diagnosis not present

## 2023-09-15 DIAGNOSIS — M6281 Muscle weakness (generalized): Secondary | ICD-10-CM | POA: Diagnosis not present

## 2023-09-15 DIAGNOSIS — R278 Other lack of coordination: Secondary | ICD-10-CM | POA: Diagnosis not present

## 2023-09-16 DIAGNOSIS — R2689 Other abnormalities of gait and mobility: Secondary | ICD-10-CM | POA: Diagnosis not present

## 2023-09-16 DIAGNOSIS — M6281 Muscle weakness (generalized): Secondary | ICD-10-CM | POA: Diagnosis not present

## 2023-09-17 DIAGNOSIS — R278 Other lack of coordination: Secondary | ICD-10-CM | POA: Diagnosis not present

## 2023-09-17 DIAGNOSIS — M6281 Muscle weakness (generalized): Secondary | ICD-10-CM | POA: Diagnosis not present

## 2023-09-18 DIAGNOSIS — R2689 Other abnormalities of gait and mobility: Secondary | ICD-10-CM | POA: Diagnosis not present

## 2023-09-18 DIAGNOSIS — G309 Alzheimer's disease, unspecified: Secondary | ICD-10-CM | POA: Diagnosis not present

## 2023-09-18 DIAGNOSIS — M6281 Muscle weakness (generalized): Secondary | ICD-10-CM | POA: Diagnosis not present

## 2023-09-22 DIAGNOSIS — R278 Other lack of coordination: Secondary | ICD-10-CM | POA: Diagnosis not present

## 2023-09-22 DIAGNOSIS — M6281 Muscle weakness (generalized): Secondary | ICD-10-CM | POA: Diagnosis not present

## 2023-09-23 DIAGNOSIS — M6281 Muscle weakness (generalized): Secondary | ICD-10-CM | POA: Diagnosis not present

## 2023-09-23 DIAGNOSIS — R2689 Other abnormalities of gait and mobility: Secondary | ICD-10-CM | POA: Diagnosis not present

## 2023-09-24 DIAGNOSIS — R278 Other lack of coordination: Secondary | ICD-10-CM | POA: Diagnosis not present

## 2023-09-24 DIAGNOSIS — M6281 Muscle weakness (generalized): Secondary | ICD-10-CM | POA: Diagnosis not present

## 2023-09-25 DIAGNOSIS — M6281 Muscle weakness (generalized): Secondary | ICD-10-CM | POA: Diagnosis not present

## 2023-09-25 DIAGNOSIS — R2689 Other abnormalities of gait and mobility: Secondary | ICD-10-CM | POA: Diagnosis not present

## 2023-09-29 DIAGNOSIS — R278 Other lack of coordination: Secondary | ICD-10-CM | POA: Diagnosis not present

## 2023-09-29 DIAGNOSIS — M6281 Muscle weakness (generalized): Secondary | ICD-10-CM | POA: Diagnosis not present

## 2023-09-30 DIAGNOSIS — R2689 Other abnormalities of gait and mobility: Secondary | ICD-10-CM | POA: Diagnosis not present

## 2023-09-30 DIAGNOSIS — M6281 Muscle weakness (generalized): Secondary | ICD-10-CM | POA: Diagnosis not present

## 2023-10-02 DIAGNOSIS — M6281 Muscle weakness (generalized): Secondary | ICD-10-CM | POA: Diagnosis not present

## 2023-10-02 DIAGNOSIS — R2689 Other abnormalities of gait and mobility: Secondary | ICD-10-CM | POA: Diagnosis not present

## 2023-10-07 DIAGNOSIS — M6281 Muscle weakness (generalized): Secondary | ICD-10-CM | POA: Diagnosis not present

## 2023-10-07 DIAGNOSIS — R2689 Other abnormalities of gait and mobility: Secondary | ICD-10-CM | POA: Diagnosis not present

## 2023-10-08 DIAGNOSIS — R278 Other lack of coordination: Secondary | ICD-10-CM | POA: Diagnosis not present

## 2023-10-08 DIAGNOSIS — M6281 Muscle weakness (generalized): Secondary | ICD-10-CM | POA: Diagnosis not present

## 2023-10-09 DIAGNOSIS — R2689 Other abnormalities of gait and mobility: Secondary | ICD-10-CM | POA: Diagnosis not present

## 2023-10-09 DIAGNOSIS — M6281 Muscle weakness (generalized): Secondary | ICD-10-CM | POA: Diagnosis not present

## 2023-10-10 DIAGNOSIS — R278 Other lack of coordination: Secondary | ICD-10-CM | POA: Diagnosis not present

## 2023-10-10 DIAGNOSIS — M6281 Muscle weakness (generalized): Secondary | ICD-10-CM | POA: Diagnosis not present

## 2023-10-13 DIAGNOSIS — G309 Alzheimer's disease, unspecified: Secondary | ICD-10-CM | POA: Diagnosis not present

## 2023-10-13 DIAGNOSIS — M6281 Muscle weakness (generalized): Secondary | ICD-10-CM | POA: Diagnosis not present

## 2023-10-13 DIAGNOSIS — R278 Other lack of coordination: Secondary | ICD-10-CM | POA: Diagnosis not present

## 2023-10-14 DIAGNOSIS — M6281 Muscle weakness (generalized): Secondary | ICD-10-CM | POA: Diagnosis not present

## 2023-10-14 DIAGNOSIS — R2689 Other abnormalities of gait and mobility: Secondary | ICD-10-CM | POA: Diagnosis not present

## 2023-10-15 DIAGNOSIS — M6281 Muscle weakness (generalized): Secondary | ICD-10-CM | POA: Diagnosis not present

## 2023-10-15 DIAGNOSIS — R278 Other lack of coordination: Secondary | ICD-10-CM | POA: Diagnosis not present

## 2023-10-16 DIAGNOSIS — M6281 Muscle weakness (generalized): Secondary | ICD-10-CM | POA: Diagnosis not present

## 2023-10-16 DIAGNOSIS — R2689 Other abnormalities of gait and mobility: Secondary | ICD-10-CM | POA: Diagnosis not present

## 2023-10-20 DIAGNOSIS — M6281 Muscle weakness (generalized): Secondary | ICD-10-CM | POA: Diagnosis not present

## 2023-10-20 DIAGNOSIS — R278 Other lack of coordination: Secondary | ICD-10-CM | POA: Diagnosis not present

## 2023-10-21 DIAGNOSIS — R2689 Other abnormalities of gait and mobility: Secondary | ICD-10-CM | POA: Diagnosis not present

## 2023-10-21 DIAGNOSIS — M6281 Muscle weakness (generalized): Secondary | ICD-10-CM | POA: Diagnosis not present

## 2023-10-22 DIAGNOSIS — M6281 Muscle weakness (generalized): Secondary | ICD-10-CM | POA: Diagnosis not present

## 2023-10-22 DIAGNOSIS — R2689 Other abnormalities of gait and mobility: Secondary | ICD-10-CM | POA: Diagnosis not present

## 2023-10-23 DIAGNOSIS — M6281 Muscle weakness (generalized): Secondary | ICD-10-CM | POA: Diagnosis not present

## 2023-10-23 DIAGNOSIS — R278 Other lack of coordination: Secondary | ICD-10-CM | POA: Diagnosis not present

## 2023-10-28 DIAGNOSIS — R2689 Other abnormalities of gait and mobility: Secondary | ICD-10-CM | POA: Diagnosis not present

## 2023-10-28 DIAGNOSIS — M6281 Muscle weakness (generalized): Secondary | ICD-10-CM | POA: Diagnosis not present

## 2023-11-03 DIAGNOSIS — R278 Other lack of coordination: Secondary | ICD-10-CM | POA: Diagnosis not present

## 2023-11-03 DIAGNOSIS — M6281 Muscle weakness (generalized): Secondary | ICD-10-CM | POA: Diagnosis not present

## 2023-11-05 DIAGNOSIS — M6281 Muscle weakness (generalized): Secondary | ICD-10-CM | POA: Diagnosis not present

## 2023-11-05 DIAGNOSIS — R278 Other lack of coordination: Secondary | ICD-10-CM | POA: Diagnosis not present

## 2023-11-07 DIAGNOSIS — M6281 Muscle weakness (generalized): Secondary | ICD-10-CM | POA: Diagnosis not present

## 2023-11-07 DIAGNOSIS — R278 Other lack of coordination: Secondary | ICD-10-CM | POA: Diagnosis not present

## 2023-11-10 DIAGNOSIS — R278 Other lack of coordination: Secondary | ICD-10-CM | POA: Diagnosis not present

## 2023-11-10 DIAGNOSIS — M6281 Muscle weakness (generalized): Secondary | ICD-10-CM | POA: Diagnosis not present

## 2023-11-11 DIAGNOSIS — M6281 Muscle weakness (generalized): Secondary | ICD-10-CM | POA: Diagnosis not present

## 2023-11-11 DIAGNOSIS — R278 Other lack of coordination: Secondary | ICD-10-CM | POA: Diagnosis not present

## 2023-11-12 DIAGNOSIS — R278 Other lack of coordination: Secondary | ICD-10-CM | POA: Diagnosis not present

## 2023-11-12 DIAGNOSIS — M6281 Muscle weakness (generalized): Secondary | ICD-10-CM | POA: Diagnosis not present

## 2023-11-13 DIAGNOSIS — G309 Alzheimer's disease, unspecified: Secondary | ICD-10-CM | POA: Diagnosis not present

## 2023-11-17 DIAGNOSIS — R278 Other lack of coordination: Secondary | ICD-10-CM | POA: Diagnosis not present

## 2023-11-17 DIAGNOSIS — M6281 Muscle weakness (generalized): Secondary | ICD-10-CM | POA: Diagnosis not present

## 2023-11-19 DIAGNOSIS — R278 Other lack of coordination: Secondary | ICD-10-CM | POA: Diagnosis not present

## 2023-11-19 DIAGNOSIS — M6281 Muscle weakness (generalized): Secondary | ICD-10-CM | POA: Diagnosis not present

## 2023-11-24 DIAGNOSIS — M6281 Muscle weakness (generalized): Secondary | ICD-10-CM | POA: Diagnosis not present

## 2023-11-24 DIAGNOSIS — R278 Other lack of coordination: Secondary | ICD-10-CM | POA: Diagnosis not present

## 2023-12-11 DIAGNOSIS — G309 Alzheimer's disease, unspecified: Secondary | ICD-10-CM | POA: Diagnosis not present

## 2024-01-08 DIAGNOSIS — G309 Alzheimer's disease, unspecified: Secondary | ICD-10-CM | POA: Diagnosis not present

## 2024-02-05 DIAGNOSIS — G309 Alzheimer's disease, unspecified: Secondary | ICD-10-CM | POA: Diagnosis not present

## 2024-02-06 DIAGNOSIS — R410 Disorientation, unspecified: Secondary | ICD-10-CM | POA: Diagnosis not present

## 2024-03-04 DIAGNOSIS — G309 Alzheimer's disease, unspecified: Secondary | ICD-10-CM | POA: Diagnosis not present

## 2024-03-31 DIAGNOSIS — N1831 Chronic kidney disease, stage 3a: Secondary | ICD-10-CM | POA: Diagnosis not present

## 2024-03-31 DIAGNOSIS — E118 Type 2 diabetes mellitus with unspecified complications: Secondary | ICD-10-CM | POA: Diagnosis not present

## 2024-03-31 DIAGNOSIS — E785 Hyperlipidemia, unspecified: Secondary | ICD-10-CM | POA: Diagnosis not present

## 2024-04-01 DIAGNOSIS — H2513 Age-related nuclear cataract, bilateral: Secondary | ICD-10-CM | POA: Diagnosis not present

## 2024-04-01 DIAGNOSIS — H40033 Anatomical narrow angle, bilateral: Secondary | ICD-10-CM | POA: Diagnosis not present

## 2024-04-01 DIAGNOSIS — G309 Alzheimer's disease, unspecified: Secondary | ICD-10-CM | POA: Diagnosis not present

## 2024-04-15 DIAGNOSIS — E1165 Type 2 diabetes mellitus with hyperglycemia: Secondary | ICD-10-CM | POA: Diagnosis not present

## 2024-04-15 DIAGNOSIS — Z79899 Other long term (current) drug therapy: Secondary | ICD-10-CM | POA: Diagnosis not present

## 2024-04-15 DIAGNOSIS — E785 Hyperlipidemia, unspecified: Secondary | ICD-10-CM | POA: Diagnosis not present

## 2024-04-20 DIAGNOSIS — Z0001 Encounter for general adult medical examination with abnormal findings: Secondary | ICD-10-CM | POA: Diagnosis not present

## 2024-04-20 DIAGNOSIS — K219 Gastro-esophageal reflux disease without esophagitis: Secondary | ICD-10-CM | POA: Diagnosis not present

## 2024-04-20 DIAGNOSIS — M1389 Other specified arthritis, multiple sites: Secondary | ICD-10-CM | POA: Diagnosis not present

## 2024-04-20 DIAGNOSIS — E785 Hyperlipidemia, unspecified: Secondary | ICD-10-CM | POA: Diagnosis not present

## 2024-04-20 DIAGNOSIS — E118 Type 2 diabetes mellitus with unspecified complications: Secondary | ICD-10-CM | POA: Diagnosis not present

## 2024-04-20 DIAGNOSIS — Z1389 Encounter for screening for other disorder: Secondary | ICD-10-CM | POA: Diagnosis not present

## 2024-04-20 DIAGNOSIS — N1831 Chronic kidney disease, stage 3a: Secondary | ICD-10-CM | POA: Diagnosis not present

## 2024-04-21 DIAGNOSIS — E785 Hyperlipidemia, unspecified: Secondary | ICD-10-CM | POA: Diagnosis not present

## 2024-04-21 DIAGNOSIS — K219 Gastro-esophageal reflux disease without esophagitis: Secondary | ICD-10-CM | POA: Diagnosis not present

## 2024-04-21 DIAGNOSIS — E118 Type 2 diabetes mellitus with unspecified complications: Secondary | ICD-10-CM | POA: Diagnosis not present

## 2024-04-21 DIAGNOSIS — N1831 Chronic kidney disease, stage 3a: Secondary | ICD-10-CM | POA: Diagnosis not present

## 2024-04-25 DIAGNOSIS — G309 Alzheimer's disease, unspecified: Secondary | ICD-10-CM | POA: Diagnosis not present

## 2024-05-12 DIAGNOSIS — Z23 Encounter for immunization: Secondary | ICD-10-CM | POA: Diagnosis not present

## 2024-05-26 ENCOUNTER — Other Ambulatory Visit: Payer: Self-pay

## 2024-05-26 ENCOUNTER — Emergency Department (HOSPITAL_COMMUNITY)
Admission: EM | Admit: 2024-05-26 | Discharge: 2024-05-26 | Disposition: A | Discharge status: Home / Self Care | Source: Skilled Nursing Facility | Attending: Emergency Medicine | Admitting: Emergency Medicine

## 2024-05-26 ENCOUNTER — Emergency Department (HOSPITAL_COMMUNITY)

## 2024-05-26 ENCOUNTER — Encounter (HOSPITAL_COMMUNITY): Payer: Self-pay

## 2024-05-26 DIAGNOSIS — R410 Disorientation, unspecified: Secondary | ICD-10-CM | POA: Diagnosis not present

## 2024-05-26 DIAGNOSIS — F0390 Unspecified dementia without behavioral disturbance: Secondary | ICD-10-CM | POA: Insufficient documentation

## 2024-05-26 DIAGNOSIS — M1389 Other specified arthritis, multiple sites: Secondary | ICD-10-CM | POA: Diagnosis not present

## 2024-05-26 DIAGNOSIS — I6782 Cerebral ischemia: Secondary | ICD-10-CM | POA: Diagnosis not present

## 2024-05-26 DIAGNOSIS — E118 Type 2 diabetes mellitus with unspecified complications: Secondary | ICD-10-CM | POA: Diagnosis not present

## 2024-05-26 DIAGNOSIS — R600 Localized edema: Secondary | ICD-10-CM | POA: Diagnosis not present

## 2024-05-26 DIAGNOSIS — E785 Hyperlipidemia, unspecified: Secondary | ICD-10-CM | POA: Diagnosis not present

## 2024-05-26 DIAGNOSIS — R531 Weakness: Secondary | ICD-10-CM | POA: Diagnosis not present

## 2024-05-26 DIAGNOSIS — R29818 Other symptoms and signs involving the nervous system: Secondary | ICD-10-CM | POA: Diagnosis not present

## 2024-05-26 DIAGNOSIS — R5383 Other fatigue: Secondary | ICD-10-CM | POA: Diagnosis not present

## 2024-05-26 DIAGNOSIS — G319 Degenerative disease of nervous system, unspecified: Secondary | ICD-10-CM | POA: Diagnosis not present

## 2024-05-26 LAB — URINALYSIS, ROUTINE W REFLEX MICROSCOPIC
Bacteria, UA: NONE SEEN
Bilirubin Urine: NEGATIVE
Glucose, UA: NEGATIVE mg/dL
Hgb urine dipstick: NEGATIVE
Ketones, ur: NEGATIVE mg/dL
Nitrite: NEGATIVE
Protein, ur: NEGATIVE mg/dL
Specific Gravity, Urine: 1.017 (ref 1.005–1.030)
pH: 6 (ref 5.0–8.0)

## 2024-05-26 LAB — CBC WITH DIFFERENTIAL/PLATELET
Abs Immature Granulocytes: 0.01 K/uL (ref 0.00–0.07)
Basophils Absolute: 0 K/uL (ref 0.0–0.1)
Basophils Relative: 0 %
Eosinophils Absolute: 0 K/uL (ref 0.0–0.5)
Eosinophils Relative: 1 %
HCT: 33.5 % — ABNORMAL LOW (ref 36.0–46.0)
Hemoglobin: 10.7 g/dL — ABNORMAL LOW (ref 12.0–15.0)
Immature Granulocytes: 0 %
Lymphocytes Relative: 33 %
Lymphs Abs: 1.9 K/uL (ref 0.7–4.0)
MCH: 30.3 pg (ref 26.0–34.0)
MCHC: 31.9 g/dL (ref 30.0–36.0)
MCV: 94.9 fL (ref 80.0–100.0)
Monocytes Absolute: 0.4 K/uL (ref 0.1–1.0)
Monocytes Relative: 8 %
Neutro Abs: 3.5 K/uL (ref 1.7–7.7)
Neutrophils Relative %: 58 %
Platelets: 244 K/uL (ref 150–400)
RBC: 3.53 MIL/uL — ABNORMAL LOW (ref 3.87–5.11)
RDW: 12.7 % (ref 11.5–15.5)
WBC: 5.9 K/uL (ref 4.0–10.5)
nRBC: 0 % (ref 0.0–0.2)

## 2024-05-26 LAB — COMPREHENSIVE METABOLIC PANEL WITH GFR
ALT: 19 U/L (ref 0–44)
AST: 24 U/L (ref 15–41)
Albumin: 3.8 g/dL (ref 3.5–5.0)
Alkaline Phosphatase: 69 U/L (ref 38–126)
Anion gap: 13 (ref 5–15)
BUN: 31 mg/dL — ABNORMAL HIGH (ref 8–23)
CO2: 25 mmol/L (ref 22–32)
Calcium: 9.4 mg/dL (ref 8.9–10.3)
Chloride: 99 mmol/L (ref 98–111)
Creatinine, Ser: 1.14 mg/dL — ABNORMAL HIGH (ref 0.44–1.00)
GFR, Estimated: 47 mL/min — ABNORMAL LOW (ref >60)
Glucose, Bld: 89 mg/dL (ref 70–99)
Potassium: 4.4 mmol/L (ref 3.5–5.1)
Sodium: 137 mmol/L (ref 135–145)
Total Bilirubin: 0.5 mg/dL (ref 0.0–1.2)
Total Protein: 7.7 g/dL (ref 6.5–8.1)

## 2024-05-26 LAB — CBG MONITORING, ED
Glucose-Capillary: 81 mg/dL (ref 70–99)
Glucose-Capillary: 93 mg/dL (ref 70–99)

## 2024-05-26 NOTE — ED Notes (Signed)
 [facility] called to transport patient. Nurse notified.

## 2024-05-26 NOTE — ED Notes (Signed)
 Received report from [facility]. Pt had large mooshy brown bm. Pt cleaned. Daughter threw pants away due to them having stool on them. Pt color wnl. Able to help some roll self side to side. Gen weakness noted. Moving all extremities.

## 2024-05-26 NOTE — ED Provider Notes (Signed)
 [place] EMERGENCY DEPARTMENT AT [facility] Provider Note   CSN: [id number] Arrival date & time: 05/26/24  1143     Patient presents with: Weakness   Laurie Mcfarland is a 89 y.o. female.   Patient is an 89 year old female who presents emergency department from her primary care doctor's office secondary to right-sided weakness noted by the PCP.  Patient does have a history of dementia and is unable provide her own history.  Upon speaking with the family they notes that the PCP was concerned for possible stroke.  Caregiver who presented on initial presentation notes that they have not noted any right-sided weakness and patient currently declines this at this point.  Family notes that they are concerned that her dementia is worsening.  Patient has no other acute complaints at this time to include chest pain, shortness of breath, headache, palpitations, dizziness or lightheadedness.   Weakness      Prior to Admission medications   Medication Sig Start Date End Date Taking? Authorizing Provider  acetaminophen  (TYLENOL ) 650 MG CR tablet Take 650 mg by mouth 3 (three) times daily.   Yes [provider]  albuterol  (VENTOLIN  HFA) 108 (90 Base) MCG/ACT inhaler Inhale 2 puffs into the lungs every 4 (four) hours as needed.  03/28/20  Yes [provider]  amLODipine  (NORVASC ) 10 MG tablet Take by mouth. 05/19/13  Yes [provider]  atorvastatin  (LIPITOR) 20 MG tablet Take 1 tablet (20 mg total) by mouth daily. 03/29/20  Yes Ricky Fines, MD  Cholecalciferol  (VITAMIN D -3) 1000 UNITS CAPS Take 2,000 Units by mouth 2 (two) times daily.   Yes [provider]  gabapentin  (NEURONTIN ) 300 MG capsule Take 1 capsule (300 mg total) by mouth 3 (three) times daily. 03/28/20  Yes Ricky Fines, MD  hydrochlorothiazide (MICROZIDE) 12.5 MG capsule Take 12.5 mg by mouth daily. 03/03/24  Yes [provider]  loperamide  (IMODIUM ) 2 MG capsule Take 1 capsule (2 mg  total) by mouth 4 (four) times daily as needed for diarrhea or loose stools. 05/20/23  Yes Idol, Julie, PA-C  losartan  (COZAAR ) 100 MG tablet Take 100 mg by mouth daily. 05/07/24  Yes [provider]  memantine  (NAMENDA ) 10 MG tablet Take 10 mg by mouth 2 (two) times daily. 03/14/20  Yes [provider]  metFORMIN  (GLUCOPHAGE ) 500 MG tablet Take 500 mg by mouth 2 (two) times daily.   Yes [provider]  Multiple Vitamin (MULTIVITAMIN WITH MINERALS) TABS tablet Take 1 tablet by mouth daily.   Yes [provider]  omeprazole  (PRILOSEC) 40 MG capsule Take 1 capsule (40 mg total) by mouth 2 (two) times daily. 07/20/19  Yes Ricky Fines, MD  primidone  (MYSOLINE ) 50 MG tablet Take 25 mg by mouth at bedtime. 06/21/19  Yes [provider]  sertraline (ZOLOFT) 50 MG tablet Take 50 mg by mouth daily. 05/07/24  Yes [provider]    Allergies: Penicillins    Review of Systems  Neurological:  Positive for weakness.  All other systems reviewed and are negative.   Updated Vital Signs BP (!) 139/53 (BP Location: Right Arm)   Pulse 68   Temp 98.1 F (36.7 C) (Oral)   Resp 18   Ht 5' 2 (1.575 m)   Wt 74.4 kg   SpO2 97%   BMI 30.00 kg/m   Physical Exam Vitals and nursing note reviewed.  Constitutional:      General: She is not in acute distress.    Appearance:  Normal appearance. She is not ill-appearing.  HENT:     Head: Normocephalic and atraumatic.     Nose: Nose normal.     Mouth/Throat:     Mouth: Mucous membranes are moist.  Eyes:     Extraocular Movements: Extraocular movements intact.     Conjunctiva/sclera: Conjunctivae normal.     Pupils: Pupils are equal, round, and reactive to light.  Cardiovascular:     Rate and Rhythm: Normal rate and regular rhythm.     Pulses: Normal pulses.     Heart sounds: Normal heart sounds. No murmur heard.    No gallop.  Pulmonary:     Effort: Pulmonary effort is normal. No respiratory distress.      Breath sounds: Normal breath sounds. No stridor. No wheezing, rhonchi or rales.  Abdominal:     General: Abdomen is flat. Bowel sounds are normal. There is no distension.     Palpations: Abdomen is soft.     Tenderness: There is no abdominal tenderness. There is no guarding.  Musculoskeletal:        General: No swelling, tenderness, deformity or signs of injury. Normal range of motion.     Cervical back: Normal range of motion and neck supple. No rigidity or tenderness.     Right lower leg: Edema present.     Left lower leg: Edema present.  Skin:    General: Skin is warm and dry.     Coloration: Skin is not jaundiced.     Findings: No bruising or rash.  Neurological:     General: No focal deficit present.     Mental Status: She is alert. Mental status is at baseline. She is disoriented.     Cranial Nerves: No cranial nerve deficit.     Sensory: No sensory deficit.     Motor: No weakness.     Coordination: Coordination normal.     Gait: Gait normal.  Psychiatric:        Mood and Affect: Mood normal.        Behavior: Behavior normal.        Thought Content: Thought content normal.        Judgment: Judgment normal.     (all labs ordered are listed, but only abnormal results are displayed) Labs Reviewed  COMPREHENSIVE METABOLIC PANEL WITH GFR - Abnormal; Notable for the following components:      Result Value   BUN 31 (*)    Creatinine, Ser 1.14 (*)    GFR, Estimated 47 (*)    All other components within normal limits  CBC WITH DIFFERENTIAL/PLATELET - Abnormal; Notable for the following components:   RBC 3.53 (*)    Hemoglobin 10.7 (*)    HCT 33.5 (*)    All other components within normal limits  URINALYSIS, ROUTINE W REFLEX MICROSCOPIC  CBG MONITORING, ED    EKG: EKG Interpretation Date/Time:  Wednesday May 26 2024 11:56:48 EDT Ventricular Rate:  69 PR Interval:  156 QRS Duration:  74 QT Interval:  370 QTC Calculation: 396 R Axis:   59  Text  Interpretation: Sinus rhythm with Premature atrial complexes Otherwise normal ECG When compared with ECG of 25-Jul-2023 11:42, No significant change since last tracing Confirmed by Towana Sharper [phone]) on 05/26/2024 12:00:05 PM  Radiology: CT HEAD WO CONTRAST Result Date: 05/26/2024 EXAM: CT HEAD WITHOUT CONTRAST 05/26/2024 12:28:32 PM TECHNIQUE: CT of the head was performed without the administration of intravenous contrast. Automated exposure control, iterative reconstruction, and/or weight based adjustment  of the mA/kV was utilized to reduce the radiation dose to as low as reasonably achievable. COMPARISON: 07/25/2023 CLINICAL HISTORY: Neuro deficit, acute, stroke suspected. Patient arrived via POV from a PCP appointment for evaluation of possible stroke. Per caregiver, the patient's PCP was concerned for a stroke due to right-sided weakness, which the patient denies. FINDINGS: BRAIN AND VENTRICLES: No acute hemorrhage. No evidence of acute infarct. No hydrocephalus. No extra-axial collection. No mass effect or midline shift. Moderate atrophy and white matter changes are similar to the prior exam. Atherosclerotic calcifications are present in the cavernous carotid arteries bilaterally. No hyperdense vessel is present. ORBITS: No acute abnormality. SINUSES: Secretions are present in the left sphenoid sinus. SOFT TISSUES AND SKULL: No acute soft tissue abnormality. No skull fracture. IMPRESSION: 1. No acute intracranial abnormality. 2. Moderate cerebral atrophy and chronic white matter changes, unchanged from 07/25/2023. 3. Atherosclerotic calcifications of the cavernous internal carotid arteries; no hyperdense vessel. 4. Secretions in the left sphenoid sinus. Electronically signed by: Lonni Necessary MD 05/26/2024 12:35 PM EDT RP Workstation: [id number]     Procedures   Medications Ordered in the ED - No data to display                                  Medical Decision Making Amount and/or  Complexity of Data Reviewed Labs: ordered. Radiology: ordered.   This patient presents to the ED for concern of right-sided weakness differential diagnosis includes VA, TIA, electrolyte derangement, dehydration, acute kidney injury, deconditioning, dementia    Additional history obtained:  Additional history obtained from family External records from outside source obtained and reviewed including medical records   Lab Tests:  I Ordered, and personally interpreted labs.  The pertinent results include: No leukocytosis, anemia at baseline, mild elevation in creatinine from baseline, normal electrolytes, unremarkable urinalysis, normal liver function   Imaging Studies ordered:  I ordered imaging studies including MRI brain, CT head I independently visualized and interpreted imaging which showed no acute intracranial process I agree with the radiologist interpretation   Problem List / ED Course:  Patient is doing well at this time and is stable for discharge home.  On initial presentation she had no focal weakness on my neurological exam.  Imaging was still obtained of the head which demonstrated no indication for CVA.  Blood work has been overall unremarkable except for a mild elevation of creatinine.  Urinalysis demonstrates no indication of urinary tract infection.  She has stable vital signs with no indication for sepsis.  Patient has no other acute complaints at this point and family notes that she has had no other acute complaints.  Do not suspect any further emergent workup is warranted at this time.  The need for close follow-up with primary care doctor was discussed as well as strict turn precautions for any new or worsening symptoms.  Patient is stable back to her long-term care facility.  Daughter voiced understanding and had no additional questions.   Social Determinants of Health:  None        Final diagnoses:  None    ED Discharge Orders     None           Daralene Lonni JONETTA DEVONNA 05/26/24 1558    Towana Ozell BROCKS, MD 05/26/24 1719

## 2024-05-26 NOTE — ED Triage Notes (Signed)
 Pt arrive via POV from a PCP appointment for evaluation of possible stroke. Per Caregiver present in Triage, the Pts PCP was concerned for a stroke due to right-sided weakness. Pt denies this, as well as the Caregiver commenting she does not see any difference in the Pt.

## 2024-05-26 NOTE — Discharge Instructions (Signed)
 Please follow-up closely with your primary care doctor on an outpatient basis.  Return to emergency department immediately for any new or worsening symptoms.

## 2024-05-27 DIAGNOSIS — G309 Alzheimer's disease, unspecified: Secondary | ICD-10-CM | POA: Diagnosis not present

## 2024-06-07 ENCOUNTER — Emergency Department (HOSPITAL_COMMUNITY)

## 2024-06-07 ENCOUNTER — Emergency Department (HOSPITAL_COMMUNITY)
Admission: EM | Admit: 2024-06-07 | Discharge: 2024-06-08 | Disposition: A | Discharge status: Home / Self Care | Attending: Emergency Medicine | Admitting: Emergency Medicine

## 2024-06-07 ENCOUNTER — Other Ambulatory Visit: Payer: Self-pay

## 2024-06-07 ENCOUNTER — Encounter (HOSPITAL_COMMUNITY): Payer: Self-pay

## 2024-06-07 DIAGNOSIS — S6991XA Unspecified injury of right wrist, hand and finger(s), initial encounter: Secondary | ICD-10-CM | POA: Diagnosis present

## 2024-06-07 DIAGNOSIS — S0990XA Unspecified injury of head, initial encounter: Secondary | ICD-10-CM | POA: Diagnosis not present

## 2024-06-07 DIAGNOSIS — E119 Type 2 diabetes mellitus without complications: Secondary | ICD-10-CM | POA: Insufficient documentation

## 2024-06-07 DIAGNOSIS — I129 Hypertensive chronic kidney disease with stage 1 through stage 4 chronic kidney disease, or unspecified chronic kidney disease: Secondary | ICD-10-CM | POA: Insufficient documentation

## 2024-06-07 DIAGNOSIS — Z7984 Long term (current) use of oral hypoglycemic drugs: Secondary | ICD-10-CM | POA: Insufficient documentation

## 2024-06-07 DIAGNOSIS — W19XXXA Unspecified fall, initial encounter: Secondary | ICD-10-CM

## 2024-06-07 DIAGNOSIS — N189 Chronic kidney disease, unspecified: Secondary | ICD-10-CM | POA: Diagnosis not present

## 2024-06-07 DIAGNOSIS — F0390 Unspecified dementia without behavioral disturbance: Secondary | ICD-10-CM | POA: Insufficient documentation

## 2024-06-07 DIAGNOSIS — S52501A Unspecified fracture of the lower end of right radius, initial encounter for closed fracture: Secondary | ICD-10-CM | POA: Diagnosis not present

## 2024-06-07 DIAGNOSIS — W01198A Fall on same level from slipping, tripping and stumbling with subsequent striking against other object, initial encounter: Secondary | ICD-10-CM | POA: Diagnosis not present

## 2024-06-07 DIAGNOSIS — Z79899 Other long term (current) drug therapy: Secondary | ICD-10-CM | POA: Diagnosis not present

## 2024-06-07 DIAGNOSIS — M7989 Other specified soft tissue disorders: Secondary | ICD-10-CM | POA: Insufficient documentation

## 2024-06-07 NOTE — ED Provider Notes (Signed)
 [place] EMERGENCY DEPARTMENT AT [facility] Provider Note   CSN: [id number] Arrival date & time: 06/07/24  1730     Patient presents with: Laurie Mcfarland Laurie Mcfarland is a 89 y.o. female.   HPI Patient is an 89 year old female presenting today for concerns for mechanical fall noted to have been tripped by the carpet at her long-term care facility where she fell and hit her left side of her face from standing.  Fall was witnessed by staff, who deny LOC, seizure-like activity.  Not taking blood thinners.  Patient states that she currently does not have any upper extremity, lower extremity, chest, abdominal pain.  Denies fever, headache, blurry vision, vertigo, tinnitus, dysphagia, shortness of breath, cough, congestion, diarrhea, dysuria.    Prior to Admission medications   Medication Sig Start Date End Date Taking? Authorizing Provider  acetaminophen  (TYLENOL ) 650 MG CR tablet Take 650 mg by mouth 3 (three) times daily.    [provider]  albuterol  (VENTOLIN  HFA) 108 (90 Base) MCG/ACT inhaler Inhale 2 puffs into the lungs every 4 (four) hours as needed.  03/28/20   [provider]  amLODipine  (NORVASC ) 10 MG tablet Take by mouth. 05/19/13   [provider]  atorvastatin  (LIPITOR) 20 MG tablet Take 1 tablet (20 mg total) by mouth daily. 03/29/20   Ricky Fines, MD  Cholecalciferol  (VITAMIN D -3) 1000 UNITS CAPS Take 2,000 Units by mouth 2 (two) times daily.    [provider]  gabapentin  (NEURONTIN ) 300 MG capsule Take 1 capsule (300 mg total) by mouth 3 (three) times daily. 03/28/20   Ricky Fines, MD  hydrochlorothiazide (MICROZIDE) 12.5 MG capsule Take 12.5 mg by mouth daily. 03/03/24   [provider]  loperamide  (IMODIUM ) 2 MG capsule Take 1 capsule (2 mg total) by mouth 4 (four) times daily as needed for diarrhea or loose stools. 05/20/23   Idol, Julie, PA-C  losartan  (COZAAR ) 100 MG tablet Take 100 mg by mouth daily. 05/07/24    [provider]  memantine  (NAMENDA ) 10 MG tablet Take 10 mg by mouth 2 (two) times daily. 03/14/20   [provider]  metFORMIN  (GLUCOPHAGE ) 500 MG tablet Take 500 mg by mouth 2 (two) times daily.    [provider]  Multiple Vitamin (MULTIVITAMIN WITH MINERALS) TABS tablet Take 1 tablet by mouth daily.    [provider]  omeprazole  (PRILOSEC) 40 MG capsule Take 1 capsule (40 mg total) by mouth 2 (two) times daily. 07/20/19   Ricky Fines, MD  primidone  (MYSOLINE ) 50 MG tablet Take 25 mg by mouth at bedtime. 06/21/19   [provider]  sertraline (ZOLOFT) 50 MG tablet Take 50 mg by mouth daily. 05/07/24   [provider]    Allergies: Penicillins    Review of Systems  Constitutional:        Fall  All other systems reviewed and are negative.   Updated Vital Signs BP (!) 146/59 (BP Location: Left Arm)   Pulse 76   Temp 97.7 F (36.5 C) (Oral)   Resp 18   Ht 5' 2 (1.575 m)   Wt 74.4 kg   SpO2 97%   BMI 30.00 kg/m   Physical Exam Vitals and nursing note reviewed.  Constitutional:      General: She is not in acute distress.    Appearance: Normal appearance. She is not ill-appearing or diaphoretic.  HENT:     Head: Normocephalic.     Comments: Noted to have small  abrasion noted to left temple and across maxillary region.  Bruising noted around abrasion. Eyes:     General: No scleral icterus.       Right eye: No discharge.        Left eye: No discharge.     Extraocular Movements: Extraocular movements intact.     Conjunctiva/sclera: Conjunctivae normal.  Cardiovascular:     Rate and Rhythm: Normal rate and regular rhythm.     Pulses: Normal pulses.     Heart sounds: Normal heart sounds. No murmur heard.    No friction rub. No gallop.  Pulmonary:     Effort: Pulmonary effort is normal. No respiratory distress.     Breath sounds: No stridor. No wheezing, rhonchi or rales.  Chest:     Chest wall: No tenderness.   Abdominal:     General: Abdomen is flat. There is no distension.     Palpations: Abdomen is soft.     Tenderness: There is no abdominal tenderness. There is no right CVA tenderness, left CVA tenderness, guarding or rebound.  Musculoskeletal:        General: No swelling, deformity or signs of injury.     Cervical back: Normal range of motion. No rigidity.     Right lower leg: No edema.     Left lower leg: No edema.  Skin:    General: Skin is warm and dry.     Findings: No bruising, erythema or lesion.  Neurological:     General: No focal deficit present.     Mental Status: She is alert and oriented to person, place, and time. Mental status is at baseline.     Sensory: No sensory deficit.     Motor: No weakness.  Psychiatric:        Mood and Affect: Mood normal.     (all labs ordered are listed, but only abnormal results are displayed) Labs Reviewed - No data to display  EKG: None  Radiology: DG Humerus Right Result Date: 06/07/2024 CLINICAL DATA:  Recent fall with right arm pain, initial encounter EXAM: RIGHT HUMERUS - 2+ VIEW COMPARISON:  None Available. FINDINGS: Degenerative changes of the acromioclavicular joint are seen. Humeral head is high-riding with remodeling of the acromion consistent with chronic rotator cuff injury. No fracture or dislocation is noted. No other focal abnormality is seen. IMPRESSION: Degenerative changes about the shoulder joint without acute abnormality. Electronically Signed   By: Oneil Devonshire M.D.   On: 06/07/2024 20:47   DG Wrist Complete Right Result Date: 06/07/2024 CLINICAL DATA:  Recent fall with right wrist pain, initial encounter EXAM: RIGHT WRIST - COMPLETE 3+ VIEW COMPARISON:  None Available. FINDINGS: Degenerative changes are noted in the first [facility] joint as well as the triscaphe joint. The subtle lucency seen on the distal radius on the forearm film is less well appreciated on these films and felt to be artifactual in nature. No definitive  fracture or dislocation is seen. IMPRESSION: Degenerative change without acute abnormality. Electronically Signed   By: Oneil Devonshire M.D.   On: 06/07/2024 20:46   DG Forearm Right Result Date: 06/07/2024 CLINICAL DATA:  Recent fall with right arm pain, initial encounter EXAM: RIGHT FOREARM - 2 VIEW COMPARISON:  None Available. FINDINGS: Subtle lucency is noted in the lateral aspect of the distal radius suspicious for undisplaced fracture. No other fracture is seen. Visualized carpal bones are within normal limits. Elbow joint is unremarkable. IMPRESSION: Findings suspicious for undisplaced fracture in the distal radius laterally.  Electronically Signed   By: Oneil Devonshire M.D.   On: 06/07/2024 20:45   CT Head Wo Contrast Result Date: 06/07/2024 CLINICAL DATA:  Recent fall with headaches and facial pain, initial encounter EXAM: CT HEAD WITHOUT CONTRAST CT MAXILLOFACIAL WITHOUT CONTRAST CT CERVICAL SPINE WITHOUT CONTRAST TECHNIQUE: Multidetector CT imaging of the head, cervical spine, and maxillofacial structures were performed using the standard protocol without intravenous contrast. Multiplanar CT image reconstructions of the cervical spine and maxillofacial structures were also generated. RADIATION DOSE REDUCTION: This exam was performed according to the departmental dose-optimization program which includes automated exposure control, adjustment of the mA and/or kV according to patient size and/or use of iterative reconstruction technique. COMPARISON:  05/26/2024 FINDINGS: CT HEAD FINDINGS Brain: No evidence of acute infarction, hemorrhage, hydrocephalus, extra-axial collection or mass lesion/mass effect. Chronic atrophic and ischemic changes are again identified. Vascular: No hyperdense vessel or unexpected calcification. Skull: Normal. Negative for fracture or focal lesion. Other: None. CT MAXILLOFACIAL FINDINGS Osseous: No acute fracture is identified. Orbits: Orbits and their contents are within normal  limits. Sinuses: Paranasal sinuses are well aerated with mild mucosal changes in the sphenoid sinus. Stable from a prior exam. Soft tissues: Surrounding soft tissue structures show no focal hematoma. CT CERVICAL SPINE FINDINGS Alignment: Loss of the normal cervical lordosis is noted. Skull base and vertebrae: 7 cervical segments are well visualized. Vertebral body height is well maintained. Multilevel disc space narrowing with osteophytic change as well as facet hypertrophic changes are noted. No acute fracture or acute facet abnormality is seen. The odontoid is within normal limits. Soft tissues and spinal canal: Surrounding soft tissue structures are within normal limits. Upper chest: Visualized lung apices are unremarkable. Other: None IMPRESSION: CT of the head: Chronic atrophic and ischemic changes without acute abnormality. CT of the maxillofacial bones: No acute fracture is noted. CT of the cervical spine: Multilevel degenerative change without acute abnormality. Electronically Signed   By: Oneil Devonshire M.D.   On: 06/07/2024 20:43   CT Cervical Spine Wo Contrast Result Date: 06/07/2024 CLINICAL DATA:  Recent fall with headaches and facial pain, initial encounter EXAM: CT HEAD WITHOUT CONTRAST CT MAXILLOFACIAL WITHOUT CONTRAST CT CERVICAL SPINE WITHOUT CONTRAST TECHNIQUE: Multidetector CT imaging of the head, cervical spine, and maxillofacial structures were performed using the standard protocol without intravenous contrast. Multiplanar CT image reconstructions of the cervical spine and maxillofacial structures were also generated. RADIATION DOSE REDUCTION: This exam was performed according to the departmental dose-optimization program which includes automated exposure control, adjustment of the mA and/or kV according to patient size and/or use of iterative reconstruction technique. COMPARISON:  05/26/2024 FINDINGS: CT HEAD FINDINGS Brain: No evidence of acute infarction, hemorrhage, hydrocephalus,  extra-axial collection or mass lesion/mass effect. Chronic atrophic and ischemic changes are again identified. Vascular: No hyperdense vessel or unexpected calcification. Skull: Normal. Negative for fracture or focal lesion. Other: None. CT MAXILLOFACIAL FINDINGS Osseous: No acute fracture is identified. Orbits: Orbits and their contents are within normal limits. Sinuses: Paranasal sinuses are well aerated with mild mucosal changes in the sphenoid sinus. Stable from a prior exam. Soft tissues: Surrounding soft tissue structures show no focal hematoma. CT CERVICAL SPINE FINDINGS Alignment: Loss of the normal cervical lordosis is noted. Skull base and vertebrae: 7 cervical segments are well visualized. Vertebral body height is well maintained. Multilevel disc space narrowing with osteophytic change as well as facet hypertrophic changes are noted. No acute fracture or acute facet abnormality is seen. The odontoid is within normal limits. Soft  tissues and spinal canal: Surrounding soft tissue structures are within normal limits. Upper chest: Visualized lung apices are unremarkable. Other: None IMPRESSION: CT of the head: Chronic atrophic and ischemic changes without acute abnormality. CT of the maxillofacial bones: No acute fracture is noted. CT of the cervical spine: Multilevel degenerative change without acute abnormality. Electronically Signed   By: Oneil Devonshire M.D.   On: 06/07/2024 20:43   CT Maxillofacial Wo Contrast Result Date: 06/07/2024 CLINICAL DATA:  Recent fall with headaches and facial pain, initial encounter EXAM: CT HEAD WITHOUT CONTRAST CT MAXILLOFACIAL WITHOUT CONTRAST CT CERVICAL SPINE WITHOUT CONTRAST TECHNIQUE: Multidetector CT imaging of the head, cervical spine, and maxillofacial structures were performed using the standard protocol without intravenous contrast. Multiplanar CT image reconstructions of the cervical spine and maxillofacial structures were also generated. RADIATION DOSE REDUCTION:  This exam was performed according to the departmental dose-optimization program which includes automated exposure control, adjustment of the mA and/or kV according to patient size and/or use of iterative reconstruction technique. COMPARISON:  05/26/2024 FINDINGS: CT HEAD FINDINGS Brain: No evidence of acute infarction, hemorrhage, hydrocephalus, extra-axial collection or mass lesion/mass effect. Chronic atrophic and ischemic changes are again identified. Vascular: No hyperdense vessel or unexpected calcification. Skull: Normal. Negative for fracture or focal lesion. Other: None. CT MAXILLOFACIAL FINDINGS Osseous: No acute fracture is identified. Orbits: Orbits and their contents are within normal limits. Sinuses: Paranasal sinuses are well aerated with mild mucosal changes in the sphenoid sinus. Stable from a prior exam. Soft tissues: Surrounding soft tissue structures show no focal hematoma. CT CERVICAL SPINE FINDINGS Alignment: Loss of the normal cervical lordosis is noted. Skull base and vertebrae: 7 cervical segments are well visualized. Vertebral body height is well maintained. Multilevel disc space narrowing with osteophytic change as well as facet hypertrophic changes are noted. No acute fracture or acute facet abnormality is seen. The odontoid is within normal limits. Soft tissues and spinal canal: Surrounding soft tissue structures are within normal limits. Upper chest: Visualized lung apices are unremarkable. Other: None IMPRESSION: CT of the head: Chronic atrophic and ischemic changes without acute abnormality. CT of the maxillofacial bones: No acute fracture is noted. CT of the cervical spine: Multilevel degenerative change without acute abnormality. Electronically Signed   By: Oneil Devonshire M.D.   On: 06/07/2024 20:43   DG Hip Unilat W or Wo Pelvis 2-3 Views Left Result Date: 06/07/2024 EXAM: 2 or more VIEW(S) XRAY OF THE unilateral HIP 06/07/2024 06:53:00 PM COMPARISON: None available. CLINICAL  HISTORY: L hip pain sp fall. Per chart: Pt BIB EMS from a fall at [facility]. Pt states head pain and back pain. Per EMS VS WDL. 127/76, HR 82, 100% RA. CBG 119. Pt does have dementia. FINDINGS: BONES AND JOINTS: Lower lumbar degenerative disc disease. Pelvic phleboliths. The visualized right hip, pelvis, and left hip are intact. No acute fracture or dislocation. Hip joint spaces are preserved. SOFT TISSUES: The soft tissues are unremarkable. IMPRESSION: 1. No acute fracture or dislocation of the left hip or visualized pelvis. Electronically signed by: Dorethia Molt MD 06/07/2024 07:14 PM EDT RP Workstation: [id number]    Procedures   Medications Ordered in the ED - No data to display    Medical Decision Making  This patient is a 89 year old female who presents to the ED for concern of mechanical fall earlier today with left face abrasion and bruising.  Witnessed by staff at long-term facility.  Noted to be at baseline had no complaints prior to fall.  On physical exam, patient is in no acute distress, afebrile, alert and orient x 3 to person, place, event, speaking in full sentences, nontachypneic, nontachycardic.  Notably does have some mild bruising and abrasion noted to left side of face.  No midline tenderness, cervical spine.  Did have some paraspinal tenderness noted to left Side.  No injuries noted to upper or lower extremities.  Has full range of motion to upper and lower extremities grossly.  Good grip strength bilaterally.  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 Noted to have some mild tenderness to left hip.   Upon evaluation by x-ray tech, noted to have had right arm tenderness which was not present upon previous examination.  X-ray techs requested further imaging of right arm which attending approved.  Right arm imaging showed nondisplaced possible fracture to distal radius.  Patient was placed in a sugar-tong splint, will have her continue  to follow-up with orthopedics.  All other imaging was unremarkable for any acute findings.  Case was discussed with attending who agreed with plan.  Patient vital signs have remained stable throughout the course of patient's time in the ED. Low suspicion for any other emergent pathology at this time. I believe this patient is safe to be discharged. Provided strict return to ER precautions. Patient expressed agreement and understanding of plan. All questions were answered.   Differential diagnoses prior to evaluation: The emergent differential diagnosis includes, but is not limited to, fracture, ligamentous injury, neurovascular injury, dislocation, malalignment,     . This is not an exhaustive differential.   Past Medical History / Co-morbidities / Social History: Fibromyalgia, type 2 diabetes, HTA, mild dementia, CKD, PE, polyneuropathy, anxiety  Additional history: Chart reviewed. Pertinent results include:   Last seen in the ER on 05/22/2024 for right sided weakness.  Lab Tests/Imaging studies: I personally interpreted labs/imaging and the pertinent results include:   DG right humerus shows no acute abnormalities DG right forearm show suspicious findings for undisplaced fracture of distal radius DC right wrist shows no acute abnormalities CT head, maxillofacial, cervical spine shows no acute changes X-ray pelvis shows no acute fracture or dislocation I agree with the radiologist interpretation.   Medications:   I have reviewed the patients home medicines and have made adjustments as needed.  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   emergency department workup does not suggest an emergent condition requiring admission or immediate  intervention beyond what has been performed at this time. The plan is: Follow-up with orthopedic surgery, symptomatic management at home. The patient is safe for discharge and has been instructed to return immediately for worsening symptoms, change in symptoms or any other concerns.   Final diagnoses:  Fall, initial encounter  Closed fracture of distal end of right radius, unspecified fracture morphology, initial encounter    ED Discharge Orders     None          Beola Terrall GORMAN DEVONNA 06/07/24 2112    Yolande Lamar BROCKS, MD 06/11/24 [phone]

## 2024-06-07 NOTE — ED Notes (Signed)
 [facility] has been called for transport

## 2024-06-07 NOTE — ED Notes (Signed)
 Pt to radiology via stretcher. Daughter reports pt c/o of increasing pain to her right arm, elbow and wrist. Orders received.

## 2024-06-07 NOTE — Discharge Instructions (Addendum)
 You were seen today for injuries after a fall.  Your CT scans of your head were reassuring as well as x-rays of your pelvis were also reassuring.  However x-rays of your right wrist did show a suspicious finding for a nondisplaced fracture to the wrist area.  You will need to follow-up with orthopedics long-term for this.  In the meantime we are sending you home with a splint.  Use Tylenol  as needed for pain.  Take Tylenol  (acetominophen)  650mg  every 4-6 hours, as needed for pain or fever. Do not take more than 4,000 mg in a 24-hour period. As this may cause liver damage. While this is rare, if you begin to develop yellowing of the skin or eyes, stop taking and return to ER immediately.  Return to the ED if having new or worsening symptoms.

## 2024-06-07 NOTE — ED Triage Notes (Signed)
 Pt BIB EMS from a fall at [facility]. Pt states head pain and back pain. Per EMS VS WDL. 127/76, HR 82, 100% RA. CBG 119. Pt does have dementia.

## 2024-06-07 NOTE — ED Notes (Addendum)
 Report called to [facility], [facility]. Discharge paperwork printed and placed in patient's room. Jut awaiting transport at this time.

## 2024-06-07 NOTE — ED Notes (Signed)
 NT in to help pt onto the bedpan.

## 2024-06-07 NOTE — ED Notes (Signed)
 Patient transported to X-ray

## 2024-06-14 ENCOUNTER — Ambulatory Visit (INDEPENDENT_AMBULATORY_CARE_PROVIDER_SITE_OTHER): Admitting: Orthopedic Surgery

## 2024-06-14 ENCOUNTER — Encounter: Payer: Self-pay | Admitting: Orthopedic Surgery

## 2024-06-14 VITALS — BP 167/71 | Ht 62.0 in | Wt 164.0 lb

## 2024-06-14 DIAGNOSIS — M25531 Pain in right wrist: Secondary | ICD-10-CM | POA: Diagnosis not present

## 2024-06-14 NOTE — Progress Notes (Signed)
 Patient: Laurie Mcfarland           Date of Birth: 09/23/1934           MRN: [id number] Visit Date: 06/14/2024 Requested by: Carlette Benita Area, MD [address] [place],  [place] [address] PCP: Carlette Benita Area, MD   Chief Complaint  Patient presents with   Wrist Injury    Right    Encounter Diagnosis  Name Primary?   Pain in right wrist ; poss frx Yes    Plan:   Brace for support  Repeat xray in 4 weeks     Chief Complaint  Patient presents with   Wrist Injury    Right     HPI  Laurie Mcfarland fell injured her right wrist the x-rays at the hospital showed questionable irregularity in the wrist or radius she was treated with a brace  She has some discomfort in this area  Body mass index is 30 kg/m.   Problem list, medical hx, medications and allergies reviewed   ROS nothing related to the wrist   Allergies  Allergen Reactions   Penicillins Rash    Has patient had a PCN reaction causing immediate rash, facial/tongue/throat swelling, SOB or lightheadedness with hypotension: No Has patient had a PCN reaction causing severe rash involving mucus membranes or skin necrosis: No Has patient had a PCN reaction that required hospitalization: No Has patient had a PCN reaction occurring within the last 10 years: No If all of the above answers are NO, then may proceed with Cephalosporin use.     BP (!) 167/71 Comment: 06/07/24  Ht 5' 2 (1.575 m)   Wt 164 lb (74.4 kg)   BMI 30.00 kg/m    Physical exam: Physical Exam The patient is awake converses well seems disoriented to overall time mood is pleasant affect is flat gait she is in a wheelchair inspection of her right upper extremity shows tenderness in the area of questionable fracture with normal range of motion in the wrist flexion extension pain with rotation pain is located along the distal to mid radius there is no instability of the wrist muscle tone is normal skin 1 abrasion pulse normal epitrochlear  lymph nodes normal and sensation is intact Ortho Exam  Data reviewed:   Image(s) reviewed with personal interpretation:  Wrist and forearm x-rays show a questionable irregular area of the distal radius forearm normal  Assessment and plan:  Encounter Diagnosis  Name Primary?   Pain in right wrist ; poss frx Yes    Continue splint x-ray in 4 weeks

## 2024-06-14 NOTE — Progress Notes (Signed)
  Intake history:  Chief Complaint  Patient presents with   Wrist Injury    Right      BP (!) 167/71 Comment: 06/07/24  Ht 5' 2 (1.575 m)   Wt 164 lb (74.4 kg)   BMI 30.00 kg/m  Body mass index is 30 kg/m.  Pharmacy? _____RX care_________________________________  WHAT ARE WE SEEING YOU FOR TODAY?   Right wrist   How long has this bothered you? (DOI?DOS?WS?)  on 06/07/24  Was there an injury? Yes  Anticoag.  No  Diabetes Yes  Heart disease Yes  Hypertension Yes  SMOKING HX No  Kidney disease Yes  Any ALLERGIES ______________________________________________   Treatment:  Have you taken:  Tylenol  Yes  Advil No  Had PT No  Had injection No  Other  _____________Velcro wrist splint from ER states no pain when wearing splint ____________

## 2024-06-22 ENCOUNTER — Encounter: Payer: Self-pay | Admitting: Adult Health

## 2024-06-22 NOTE — Progress Notes (Signed)
 This encounter was created in error - please disregard.

## 2024-07-03 ENCOUNTER — Other Ambulatory Visit: Payer: Self-pay

## 2024-07-03 ENCOUNTER — Emergency Department (HOSPITAL_COMMUNITY)

## 2024-07-03 ENCOUNTER — Emergency Department (HOSPITAL_COMMUNITY)
Admission: EM | Admit: 2024-07-03 | Discharge: 2024-07-03 | Disposition: A | Discharge status: Home / Self Care | Source: Skilled Nursing Facility | Attending: Emergency Medicine | Admitting: Emergency Medicine

## 2024-07-03 ENCOUNTER — Encounter (HOSPITAL_COMMUNITY): Payer: Self-pay

## 2024-07-03 DIAGNOSIS — F0390 Unspecified dementia without behavioral disturbance: Secondary | ICD-10-CM | POA: Diagnosis not present

## 2024-07-03 DIAGNOSIS — I10 Essential (primary) hypertension: Secondary | ICD-10-CM | POA: Diagnosis not present

## 2024-07-03 DIAGNOSIS — W19XXXA Unspecified fall, initial encounter: Secondary | ICD-10-CM | POA: Diagnosis not present

## 2024-07-03 DIAGNOSIS — S069XAA Unspecified intracranial injury with loss of consciousness status unknown, initial encounter: Secondary | ICD-10-CM | POA: Insufficient documentation

## 2024-07-03 DIAGNOSIS — S0990XA Unspecified injury of head, initial encounter: Secondary | ICD-10-CM | POA: Diagnosis present

## 2024-07-03 DIAGNOSIS — I629 Nontraumatic intracranial hemorrhage, unspecified: Secondary | ICD-10-CM

## 2024-07-03 DIAGNOSIS — Z23 Encounter for immunization: Secondary | ICD-10-CM | POA: Insufficient documentation

## 2024-07-03 HISTORY — DX: Unspecified dementia, unspecified severity, without behavioral disturbance, psychotic disturbance, mood disturbance, and anxiety: F03.90

## 2024-07-03 MED ORDER — TETANUS-DIPHTH-ACELL PERTUSSIS 5-2-15.5 LF-MCG/0.5 IM SUSP
0.5000 mL | Freq: Once | INTRAMUSCULAR | Status: AC
  Administered 2024-07-03: 0.5 mL via INTRAMUSCULAR
  Filled 2024-07-03: qty 0.5

## 2024-07-03 NOTE — ED Triage Notes (Signed)
 Pt arrived [facility] from [facility] c/o a fall beside her bed. Pt has a laceration to the back of her head and bleeding is under control. Fall was unwitnessed. Pt already had a wrist split to her right wrist. Pt was found on her back with her head right under the bed rail. Pt is not on blood thinners and no vomiting noted. CBG . C-collar applied. 20 g IV in LAC.

## 2024-07-03 NOTE — Discharge Instructions (Signed)
 Laurie Mcfarland was seen in the emergency room after she had a fall.  CT scan of the brain revealed that she had intracranial bleeding.  Fortunately, the bleeding is minor in nature and has not caused any neurologic disability.   We have discussed the case with our neurosurgeons, and she is not a candidate for surgery.  Please do neurochecks every 4 hours for rest of the day today. Send her back to the emergency room immediately if there is any severe headaches, severe nausea and vomiting, confusion, seizure-like activity, new neurologic symptoms such as one-sided weakness, numbness, speech disturbance or loss of vision.  Please consider initiating physical therapy for evaluation given the nature of injury.

## 2024-07-03 NOTE — Progress Notes (Signed)
 Patient ID: Laurie Mcfarland, female   DOB: 13-Apr-1935, 89 y.o.   MRN: [id number] BP (!) 160/87   Pulse (!) 53   Temp 98.9 F (37.2 C) (Oral)   Resp 11   Ht 5' 2 (1.575 m)   Wt 77.1 kg   SpO2 95%   BMI 31.09 kg/m  CT scan reviewed. There is a minuscule amount of blood in the left atria. There are absolutely no operative indications. She has had observation with no neurological changes. Ok to Discharge. No followup needed

## 2024-07-03 NOTE — ED Provider Notes (Signed)
 [place] EMERGENCY DEPARTMENT AT [facility] Provider Note   CSN: [id number] Arrival date & time: 07/03/24  9264     Patient presents with: Laurie Mcfarland Laurie Mcfarland is a 89 y.o. female.   HPI    89 year old female comes in with chief complaint of mechanical fall.  Patient has history of recurrent falls, hypertension, dementia.  She was found to have unwitnessed fall at the nursing home this morning.  Per EMS, patient has a gash in the back of her head.  Patient does not recall the details on the fall.  She currently states that she has no pain.  Upon review of patient's records, it does not appear that she is on any blood thinners.   Prior to Admission medications   Medication Sig Start Date End Date Taking? Authorizing Provider  acetaminophen  (TYLENOL ) 650 MG CR tablet Take 650 mg by mouth 3 (three) times daily.    [provider]  albuterol  (VENTOLIN  HFA) 108 (90 Base) MCG/ACT inhaler Inhale 2 puffs into the lungs every 4 (four) hours as needed.  03/28/20   [provider]  amLODipine  (NORVASC ) 10 MG tablet Take by mouth. 05/19/13   [provider]  atorvastatin  (LIPITOR) 20 MG tablet Take 1 tablet (20 mg total) by mouth daily. 03/29/20   Ricky Fines, MD  Cholecalciferol  (VITAMIN D -3) 1000 UNITS CAPS Take 2,000 Units by mouth 2 (two) times daily.    [provider]  gabapentin  (NEURONTIN ) 300 MG capsule Take 1 capsule (300 mg total) by mouth 3 (three) times daily. 03/28/20   Ricky Fines, MD  hydrochlorothiazide (MICROZIDE) 12.5 MG capsule Take 12.5 mg by mouth daily. 03/03/24   [provider]  loperamide  (IMODIUM ) 2 MG capsule Take 1 capsule (2 mg total) by mouth 4 (four) times daily as needed for diarrhea or loose stools. 05/20/23   Idol, Julie, PA-C  losartan  (COZAAR ) 100 MG tablet Take 100 mg by mouth daily. 05/07/24   [provider]  memantine  (NAMENDA ) 10 MG tablet Take 10 mg by mouth 2 (two) times daily. 03/14/20    [provider]  metFORMIN  (GLUCOPHAGE ) 500 MG tablet Take 500 mg by mouth 2 (two) times daily.    [provider]  Multiple Vitamin (MULTIVITAMIN WITH MINERALS) TABS tablet Take 1 tablet by mouth daily.    [provider]  omeprazole  (PRILOSEC) 40 MG capsule Take 1 capsule (40 mg total) by mouth 2 (two) times daily. 07/20/19   Ricky Fines, MD  primidone  (MYSOLINE ) 50 MG tablet Take 25 mg by mouth at bedtime. 06/21/19   [provider]  sertraline (ZOLOFT) 50 MG tablet Take 50 mg by mouth daily. 05/07/24   [provider]    Allergies: Penicillins    Review of Systems  All other systems reviewed and are negative.   Updated Vital Signs BP (!) 160/87   Pulse (!) 53   Temp 98.9 F (37.2 C) (Oral)   Resp 11   Ht 5' 2 (1.575 m)   Wt 77.1 kg   SpO2 95%   BMI 31.09 kg/m   Physical Exam Vitals and nursing note reviewed.  Constitutional:      Appearance: She is well-developed.  HENT:     Head: Atraumatic.  Eyes:     Extraocular Movements: Extraocular movements intact.     Pupils: Pupils are equal, round, and reactive to light.  Neck:     Comments: In a c-collar Cardiovascular:     Rate  and Rhythm: Normal rate.  Pulmonary:     Effort: Pulmonary effort is normal.  Musculoskeletal:        General: No deformity.     Comments: Head to toe evaluation shows no hematoma, bleeding of the scalp, no facial abrasions, no spine step offs, crepitus of the chest or neck, no tenderness to palpation of the bilateral upper and lower extremities, no gross deformities, no chest tenderness, no pelvic pain.   Skin:    General: Skin is warm and dry.     Comments: 5 cm stellate type laceration to the scalp, in the occipital region, left side  Neurological:     Mental Status: She is alert. Mental status is at baseline.     Cranial Nerves: No cranial nerve deficit.     Sensory: No sensory deficit.     Motor: No weakness.     Comments: Moving all 4  extremities     (all labs ordered are listed, but only abnormal results are displayed) Labs Reviewed - No data to display   EKG: EKG Interpretation Date/Time:  Saturday July 03 2024 07:53:42 EDT Ventricular Rate:  56 PR Interval:  173 QRS Duration:  95 QT Interval:  422 QTC Calculation: 408 R Axis:   49  Text Interpretation: Sinus rhythm Atrial premature complexes in couplets No acute changes No significant change since last tracing Confirmed by Charlyn Sora [phone]) on 07/03/2024 9:56:04 AM  Radiology: CT head Traumatic brain injury risk stratification:  Skull fracture: No (low - mbig 1)  Subdural hematoma (sdh):  No (low)  Subarachnoid hemorrhage (sah):  No  Epidural hematoma (edh):  No (low - mbig 1)  Cerebral contusion, intra-axial, intraparenchymal hemorrhage (iph):  No  Intraventricular hemorrhage (ivh):  Yes (high - mbig 3)  Midline shift > 1mm or edema/effacement of sulci/vents:  No (low - mbig 1)    IMPRESSION:  1. Trace layering intraventricular hemorrhage in the left occipital horn.  2. Cerebral volume loss with prominent sulci and ventricles.  3. Scattered periventricular and subcortical white matter low-density changes  compatible with chronic microvascular ischemic change.  4. Moderate opacification of the sphenoid sinus and debris in the right  external auditory canal.     .Critical Care  Performed by: Charlyn Sora, MD Authorized by: Charlyn Sora, MD   Critical care provider statement:    Critical care time (minutes):  78   Critical care time was exclusive of:  Separately billable procedures and treating other patients   Critical care was necessary to treat or prevent imminent or life-threatening deterioration of the following conditions:  CNS failure or compromise and trauma   Critical care was time spent personally by me on the following activities:  Development of treatment plan with patient or surrogate, discussions with  consultants, evaluation of patient's response to treatment, examination of patient, ordering and review of laboratory studies, ordering and review of radiographic studies, ordering and performing treatments and interventions, pulse oximetry, re-evaluation of patient's condition, review of old charts and obtaining history from patient or surrogate .Laceration Repair  Date/Time: 07/03/2024 10:05 AM  Performed by: Charlyn Sora, MD Authorized by: Charlyn Sora, MD   Consent:    Consent obtained:  Verbal   Consent given by:  Patient and guardian   Risks, benefits, and alternatives were discussed: yes     Risks discussed:  Infection, pain and poor wound healing   Alternatives discussed:  No treatment Universal protocol:    Procedure explained and questions answered to patient  or proxy's satisfaction: yes     Immediately prior to procedure, a time out was called: yes     Patient identity confirmed:  Arm band Laceration details:    Location:  Scalp   Scalp location:  Occipital   Length (cm):  5   Depth (mm):  3 Pre-procedure details:    Preparation:  Patient was prepped and draped in usual sterile fashion Exploration:    Limited defect created (wound extended): yes     Hemostasis achieved with:  Direct pressure   Imaging outcome: foreign body not noted     Wound exploration: wound explored through full range of motion and entire depth of wound visualized     Wound extent: areolar tissue not violated and fascia not violated     Contaminated: no   Treatment:    Area cleansed with:  Saline   Amount of cleaning:  Standard   Irrigation solution:  Tap water    Irrigation volume:  10   Irrigation method:  Syringe   Debridement:  None   Undermining:  None   Scar revision: no   Skin repair:    Repair method:  Staples   Number of staples:  5 Approximation:    Approximation:  Loose Repair type:    Repair type:  Simple Post-procedure details:    Dressing:  Open (no dressing)    Procedure completion:  Tolerated well, no immediate complications    Medications Ordered in the ED  Tdap (ADACEL) injection 0.5 mL (0.5 mLs Intramuscular Given 07/03/24 0949)    Clinical Course as of 07/03/24 1156  Sat Jul 03, 2024  0930 CT Head Wo Contrast CT scan of the brain independently interpreted after I received a call from radiologist.  There is trace layering of blood in the left ventricle.  I called the legal guardian, [employer] services about the finding.  Patient does not have a living will.  According to Ms. Laurie Mcfarland, who is representing [employer], patient's legal guardian, they are [employer] that works in partnership with DSS, and patient's family.  They request discussing the case with neurosurgery to know what options are available for treatment and what to expect prognostically.  I consulted neurosurgery and spoke with Dr. Gillie, who will review the images and call me back.  I discussed with him patient's comorbidities, lack of anticoagulation use and that patient does not have any profound neurodeficits -and it appears that most likely surgical intervention will not be offered.   [AN]  0945 Patient's contact information in addition to the legal guardian list Ms. Laurie Mcfarland and Ms. Laurie Mcfarland as the daughters and DPR (direct patient relatives? -With whom we can share PHI).  I had informed Ms. Laurie Mcfarland, that she can call family, but I will also touch base with them with more information.  After talking Dr. Gillie, I called Ms. Laurie Mcfarland.  The call went to voicemail.  I did not leave a voicemail.  Thereafter I called the other daughter Ms. Laurie Mcfarland.  I informed her that her mother was in the emergency room after a fall.  She started crying and screaming.  She asked me if she was okay, to which I said she was fine and then she responded,  I need to call my family and hung up on me.  Few minutes later she called me, apologized for hanging up, and asked me to give her more information.   I informed her that she had a fall and brain injury, before I could complete the  sentence she started wailing and screaming again and hung up.  I called Ms. Laurie Mcfarland and did inform them of this incomplete encounter with Ms. Laurie Mcfarland.  For now, we have agreed that I will give medical information to the legal guardian who will then share information with the family and make the plan.  Patient consented for the laceration repair.  The guardian also gave us  consent to repair the scalp laceration with staples. [AN]  1059 Dr Lindalee, Neurosurgery has cleared the patient. No need for admission or repeat imaging or follow up.  I spoke with Ms. Laurie Mcfarland again, and discussed the recommendation.  There is family at bedside who I will talk to now.   [AN]  1155 Repeat exam done at 11:30 AM.  Patient remains at baseline, actually is more alert and responding very sharply now.  We have grandkids at the bedside.  Results of the ER workup discussed with them as well.  Discharge instructions and neurosurgery recommendations also discussed.  Stable for discharge. [AN]    Clinical Course User Index [AN] Charlyn Sora, MD                                 Medical Decision Making Amount and/or Complexity of Data Reviewed Radiology: ordered. Decision-making details documented in ED Course.  Risk Prescription drug management.   89 year old patient comes in after sustaining what appears to be a mechanical fall. Pertinent past medical includes diabetes, hypertension, CKD.  She has history of PE, but it does not appear that she is on any blood thinners. Collateral history provided by   Based on my history and exam, differential diagnosis includes: - Traumatic brain injury including intracranial hemorrhage - Long bone fractures - Contusions - Soft tissue injury - Concussion - Laceration to the scalp  Based on the initial assessment, the following workup was initiated CT head, CT C-spine and we have ordered x-ray  of the chest and pelvis.    Final diagnoses:  Traumatic brain injury, with unknown loss of consciousness status, initial encounter [facility])  Fall, initial encounter  Intracranial bleeding [facility])    ED Discharge Orders     None          Charlyn Sora, MD 07/03/24 1157

## 2024-07-08 ENCOUNTER — Other Ambulatory Visit: Payer: Self-pay

## 2024-07-08 ENCOUNTER — Emergency Department (HOSPITAL_COMMUNITY)
Admission: EM | Admit: 2024-07-08 | Discharge: 2024-07-10 | Disposition: A | Discharge status: Home / Self Care | Source: Skilled Nursing Facility

## 2024-07-08 ENCOUNTER — Emergency Department (HOSPITAL_COMMUNITY)

## 2024-07-08 ENCOUNTER — Encounter (HOSPITAL_COMMUNITY): Payer: Self-pay | Admitting: Emergency Medicine

## 2024-07-08 DIAGNOSIS — F0390 Unspecified dementia without behavioral disturbance: Secondary | ICD-10-CM | POA: Insufficient documentation

## 2024-07-08 DIAGNOSIS — R531 Weakness: Secondary | ICD-10-CM | POA: Diagnosis not present

## 2024-07-08 DIAGNOSIS — W01198A Fall on same level from slipping, tripping and stumbling with subsequent striking against other object, initial encounter: Secondary | ICD-10-CM | POA: Insufficient documentation

## 2024-07-08 DIAGNOSIS — S0101XA Laceration without foreign body of scalp, initial encounter: Secondary | ICD-10-CM | POA: Insufficient documentation

## 2024-07-08 DIAGNOSIS — N390 Urinary tract infection, site not specified: Secondary | ICD-10-CM | POA: Diagnosis not present

## 2024-07-08 DIAGNOSIS — W19XXXA Unspecified fall, initial encounter: Secondary | ICD-10-CM

## 2024-07-08 DIAGNOSIS — S0990XA Unspecified injury of head, initial encounter: Secondary | ICD-10-CM

## 2024-07-08 MED ORDER — LOPERAMIDE HCL 2 MG PO CAPS
2.0000 mg | ORAL_CAPSULE | Freq: Four times a day (QID) | ORAL | Status: DC | PRN
  Administered 2024-07-09: 2 mg via ORAL
  Filled 2024-07-08: qty 1

## 2024-07-08 MED ORDER — METFORMIN HCL 500 MG PO TABS
500.0000 mg | ORAL_TABLET | Freq: Two times a day (BID) | ORAL | Status: DC
  Administered 2024-07-08 – 2024-07-10 (×5): 500 mg via ORAL
  Filled 2024-07-08 (×5): qty 1

## 2024-07-08 MED ORDER — GABAPENTIN 300 MG PO CAPS
300.0000 mg | ORAL_CAPSULE | Freq: Three times a day (TID) | ORAL | Status: DC
  Administered 2024-07-08 – 2024-07-10 (×7): 300 mg via ORAL
  Filled 2024-07-08 (×7): qty 1

## 2024-07-08 MED ORDER — LOSARTAN POTASSIUM 25 MG PO TABS
100.0000 mg | ORAL_TABLET | Freq: Every day | ORAL | Status: DC
  Administered 2024-07-08 – 2024-07-10 (×3): 100 mg via ORAL
  Filled 2024-07-08 (×3): qty 4

## 2024-07-08 MED ORDER — PANTOPRAZOLE SODIUM 40 MG PO TBEC
40.0000 mg | DELAYED_RELEASE_TABLET | Freq: Every day | ORAL | Status: DC
  Administered 2024-07-08 – 2024-07-10 (×3): 40 mg via ORAL
  Filled 2024-07-08 (×3): qty 1

## 2024-07-08 MED ORDER — AMLODIPINE BESYLATE 5 MG PO TABS
10.0000 mg | ORAL_TABLET | Freq: Every day | ORAL | Status: DC
  Administered 2024-07-08 – 2024-07-10 (×3): 10 mg via ORAL
  Filled 2024-07-08 (×3): qty 2

## 2024-07-08 MED ORDER — HYDROCHLOROTHIAZIDE 12.5 MG PO TABS
12.5000 mg | ORAL_TABLET | Freq: Every day | ORAL | Status: DC
  Administered 2024-07-08 – 2024-07-10 (×3): 12.5 mg via ORAL
  Filled 2024-07-08 (×3): qty 1

## 2024-07-08 MED ORDER — ACETAMINOPHEN 325 MG PO TABS
650.0000 mg | ORAL_TABLET | Freq: Three times a day (TID) | ORAL | Status: DC
  Administered 2024-07-08 – 2024-07-10 (×7): 650 mg via ORAL
  Filled 2024-07-08 (×7): qty 2

## 2024-07-08 MED ORDER — PRIMIDONE 50 MG PO TABS
25.0000 mg | ORAL_TABLET | Freq: Every day | ORAL | Status: DC
  Administered 2024-07-08 – 2024-07-09 (×2): 25 mg via ORAL
  Filled 2024-07-08 (×2): qty 1

## 2024-07-08 MED ORDER — ATORVASTATIN CALCIUM 10 MG PO TABS
20.0000 mg | ORAL_TABLET | Freq: Every day | ORAL | Status: DC
  Administered 2024-07-08 – 2024-07-10 (×3): 20 mg via ORAL
  Filled 2024-07-08 (×3): qty 2

## 2024-07-08 MED ORDER — MEMANTINE HCL 10 MG PO TABS
10.0000 mg | ORAL_TABLET | Freq: Two times a day (BID) | ORAL | Status: DC
  Administered 2024-07-08 – 2024-07-10 (×5): 10 mg via ORAL
  Filled 2024-07-08 (×5): qty 1

## 2024-07-08 MED ORDER — SERTRALINE HCL 50 MG PO TABS
50.0000 mg | ORAL_TABLET | Freq: Every day | ORAL | Status: DC
  Administered 2024-07-08 – 2024-07-10 (×3): 50 mg via ORAL
  Filled 2024-07-08 (×3): qty 1

## 2024-07-08 NOTE — ED Triage Notes (Signed)
 Pt to the ED with [facility] from [facility] with a head laceration after a fall.  Pt has a HX of Dementia.  Pt forgets she is unable to walk with a walker and has to use a wheelchair.

## 2024-07-08 NOTE — ED Provider Notes (Signed)
 Patient's nursing facility declines to accept her to same level of care.  I discussed patient case with social work, PT eval as well.   Garrick Charleston, MD 07/08/24 1023

## 2024-07-08 NOTE — ED Notes (Signed)
 Pt taken to the bathroom in a wheelchair.  Pt is able to stand and pivot with a lot of assistance.  Brief changed.

## 2024-07-08 NOTE — ED Notes (Signed)
 Dressing applied to head with 3 staples.

## 2024-07-08 NOTE — ED Notes (Signed)
 This nurse call the floor supervisor, Kyra at [facility] to let her know the patient is going to be discharged. [phone]

## 2024-07-08 NOTE — ED Notes (Signed)
 This nurse spoke to [name] How at [facility] who states the patient requires a higher level of care than can be provided to her since this is the 2nd fall she has had this week.  [name] states they do not transport patients and they do not feel this is a safe DC. [phone]  Cassandra [phone]

## 2024-07-08 NOTE — ED Notes (Signed)
 Assist x2 to wheelchair; pt to restroom. Gait unsteady.

## 2024-07-08 NOTE — ED Notes (Signed)
 TOC notes TOC consult for SNF placement. CSW completed chart review and notes pt is from [facility], it seems as if facility is stating pt needs a higher level of care at this time. CSW updated MD that PT eval was needed, MD placing order for PT at this time. CSW to follow up once PT eval has been completed.

## 2024-07-08 NOTE — ED Notes (Signed)
 Pt moved to chair by physical therapy. Family members present with pt.

## 2024-07-08 NOTE — ED Notes (Addendum)
 Transition of Care [facility]) - Emergency Department Mini Assessment   Patient Details  Name: Laurie Mcfarland MRN: [id number] Date of Birth: 02-05-1935  Transition of Care [facility]) CM/SW Contact:    Noreen KATHEE Cleotilde ISRAEL Phone Number: 07/08/2024, 2:57 PM   Clinical Narrative:  CSW called facility and spoke with [name] who stated that patient has been with them since 2016, they  assist with all ADL's, and patient was only using a walker.  [name] provided information for [employer]. CSW called and spoke with [name], patient LG about PT recommendation for SNF. [name] agreeable and stated that CSW could send patient referral anywhere in [place].    Addendum 4:02 PM  CSW reviewed bed offers and attempted to contact [facility]. CSW unable to get in contact with [name], so left a confidential VM.   ED Mini Assessment: What brought you to the Emergency Department? : fall  Barriers to Discharge: No SNF bed  Barrier interventions: placement for short-term rehab  Means of departure: Not know  Interventions which prevented an admission or readmission: SNF Placement    Patient Contact and Communications Key Contact 1: [name] How   Spoke with: LG Contact Date: 07/08/24,   Contact time: 1456 Contact Phone Number: [phone] ex. [phone] Call outcome: PT recommendation  Patient states their goals for this hospitalization and ongoing recovery are:: Short-term rehab CMS Medicare.gov Compare Post Acute Care list provided to:: Legal Guardian    Admission diagnosis:  EMS: Fall Patient Active Problem List   Diagnosis Date Noted   Abdominal pain 05/05/2020   Bilateral lower extremity edema 04/07/2020   Hyperlipidemia associated with type 2 diabetes mellitus (HCC) 03/29/2020   Hypertension associated with type 2 diabetes mellitus (HCC) 03/29/2020   CKD stage 3 due to type 2 diabetes mellitus (HCC) 03/29/2020   Essential tremor 03/29/2020   Major depression, recurrent, chronic 03/29/2020   Aortic  atherosclerosis 03/29/2020   Chronic bilateral low back pain without sciatica 03/29/2020   Dementia with behavioral disturbance (HCC)    Small bowel obstruction (HCC) 03/19/2020   History of Helicobacter pylori infection 08/19/2019   Multiple gastric ulcers 08/19/2019   Multiple duodenal ulcers 08/19/2019   Abnormal weight loss 08/19/2019   PUD (peptic ulcer disease)    Acute blood loss anemia 07/17/2019   Fibromyalgia 07/17/2019   Bilateral pulmonary embolism (HCC) 02/16/2018   Diabetic peripheral neuropathy (HCC) 02/16/2018   Syncope 06/29/2016   Renal insufficiency 06/29/2016   Anemia 06/29/2016   Edema    Mild dementia (HCC) 03/01/2015   Gait instability 03/01/2015   Frequent falls 03/01/2015   Memory loss 09/03/2014   HTN (hypertension) 09/03/2014   Hyperlipidemia 09/03/2014   Diabetes type 2, controlled (HCC) 09/03/2014   Confusion 07/13/2014   Lower urinary tract infectious disease 07/13/2014   Acute encephalopathy 07/13/2014   Fall at home 07/13/2014   DM type 2 (diabetes mellitus, type 2) (HCC) 07/13/2014   Esophageal dysphagia 06/09/2013   GERD (gastroesophageal reflux disease) 06/09/2013   Early satiety 06/09/2013   PCP:  Carlette Benita Area, MD Pharmacy:   [facility], [place] - [address] [place] [address] Phone: [phone] Fax: [phone]

## 2024-07-08 NOTE — [place] FL2 (Cosign Needed)
 [place]  MEDICAID FL2 LEVEL OF CARE FORM     IDENTIFICATION  Patient Name: Laurie Mcfarland Birthdate: Jul 12, 1935 Sex: female Admission Date (Current Location): 07/08/2024  [facility] [place] and [place] Number:  [employer] and Address:  [facility],  [address], [place] [address]      Provider Number: [id number]  Attending Physician Name and Address:  Garrick Charleston, MD  Relative Name and Phone Number:  [name] How [facility] ) [phone]    Current Level of Care: Hospital Recommended Level of Care: Skilled Nursing Facility Prior Approval Number:    Date Approved/Denied:   PASRR Number:    Discharge Plan: SNF    Current Diagnoses: Patient Active Problem List   Diagnosis Date Noted   Abdominal pain 05/05/2020   Bilateral lower extremity edema 04/07/2020   Hyperlipidemia associated with type 2 diabetes mellitus (HCC) 03/29/2020   Hypertension associated with type 2 diabetes mellitus (HCC) 03/29/2020   CKD stage 3 due to type 2 diabetes mellitus (HCC) 03/29/2020   Essential tremor 03/29/2020   Major depression, recurrent, chronic 03/29/2020   Aortic atherosclerosis 03/29/2020   Chronic bilateral low back pain without sciatica 03/29/2020   Dementia with behavioral disturbance (HCC)    Small bowel obstruction (HCC) 03/19/2020   History of Helicobacter pylori infection 08/19/2019   Multiple gastric ulcers 08/19/2019   Multiple duodenal ulcers 08/19/2019   Abnormal weight loss 08/19/2019   PUD (peptic ulcer disease)    Acute blood loss anemia 07/17/2019   Fibromyalgia 07/17/2019   Bilateral pulmonary embolism (HCC) 02/16/2018   Diabetic peripheral neuropathy (HCC) 02/16/2018   Syncope 06/29/2016   Renal insufficiency 06/29/2016   Anemia 06/29/2016   Edema    Mild dementia (HCC) 03/01/2015   Gait instability 03/01/2015   Frequent falls 03/01/2015   Memory loss 09/03/2014   HTN (hypertension) 09/03/2014   Hyperlipidemia 09/03/2014   Diabetes type  2, controlled (HCC) 09/03/2014   Confusion 07/13/2014   Lower urinary tract infectious disease 07/13/2014   Acute encephalopathy 07/13/2014   Fall at home 07/13/2014   DM type 2 (diabetes mellitus, type 2) (HCC) 07/13/2014   Esophageal dysphagia 06/09/2013   GERD (gastroesophageal reflux disease) 06/09/2013   Early satiety 06/09/2013    Orientation RESPIRATION BLADDER Height & Weight     Self  Normal Continent Weight: 170 lb (77.1 kg) Height:  5' 2 (157.5 cm)  BEHAVIORAL SYMPTOMS/MOOD NEUROLOGICAL BOWEL NUTRITION STATUS      Continent Diet (Regular)  AMBULATORY STATUS COMMUNICATION OF NEEDS Skin   Extensive Assist Verbally Normal                       Personal Care Assistance Level of Assistance  Bathing, Feeding, Dressing Bathing Assistance: Maximum assistance Feeding assistance: Independent Dressing Assistance: Maximum assistance     Functional Limitations Info  Sight, Hearing, Speech Sight Info: Adequate Hearing Info: Adequate Speech Info: Adequate    SPECIAL CARE FACTORS FREQUENCY  PT (By licensed PT), OT (By licensed OT)     PT Frequency: 5 x a week OT Frequency: 5 x a week            Contractures Contractures Info: Not present    Additional Factors Info  Code Status, Allergies Code Status Info: Full Allergies Info: Penicillins           Current Medications (07/08/2024):  This is the current hospital active medication list Current Facility-Administered Medications  Medication Dose Route Frequency Provider Last Rate  Last Admin   acetaminophen  (TYLENOL ) tablet 650 mg  650 mg Oral TID Garrick Charleston, MD   650 mg at 07/08/24 1106   amLODipine  (NORVASC ) tablet 10 mg  10 mg Oral Daily Garrick Charleston, MD   10 mg at 07/08/24 1113   atorvastatin  (LIPITOR) tablet 20 mg  20 mg Oral Daily Garrick Charleston, MD   20 mg at 07/08/24 1107   gabapentin  (NEURONTIN ) capsule 300 mg  300 mg Oral TID Garrick Charleston, MD   300 mg at 07/08/24 1115    hydrochlorothiazide (HYDRODIURIL) tablet 12.5 mg  12.5 mg Oral Daily Garrick Charleston, MD   12.5 mg at 07/08/24 1113   loperamide  (IMODIUM ) capsule 2 mg  2 mg Oral QID PRN Garrick Charleston, MD       losartan  (COZAAR ) tablet 100 mg  100 mg Oral Daily Garrick Charleston, MD   100 mg at 07/08/24 1107   memantine  (NAMENDA ) tablet 10 mg  10 mg Oral BID Garrick Charleston, MD   10 mg at 07/08/24 1107   metFORMIN  (GLUCOPHAGE ) tablet 500 mg  500 mg Oral BID Garrick Charleston, MD   500 mg at 07/08/24 1106   pantoprazole  (PROTONIX ) EC tablet 40 mg  40 mg Oral Daily Garrick Charleston, MD   40 mg at 07/08/24 1113   primidone  (MYSOLINE ) tablet 25 mg  25 mg Oral QHS Garrick Charleston, MD       sertraline (ZOLOFT) tablet 50 mg  50 mg Oral Daily Garrick Charleston, MD   50 mg at 07/08/24 1107   Current Outpatient Medications  Medication Sig Dispense Refill   acetaminophen  (TYLENOL ) 650 MG CR tablet Take 650 mg by mouth 3 (three) times daily.     albuterol  (VENTOLIN  HFA) 108 (90 Base) MCG/ACT inhaler Inhale 2 puffs into the lungs every 4 (four) hours as needed.      amLODipine  (NORVASC ) 10 MG tablet Take by mouth.     atorvastatin  (LIPITOR) 20 MG tablet Take 1 tablet (20 mg total) by mouth daily.     Bismuth Subsalicylate (STOMACH RELIEF PO) Take 30 mLs by mouth daily as needed. Take 30 ml by mouth every 30 minutes to 1 hour as needed for diarrhea. Do Not Exceed 8 doses in 24 hours.     gabapentin  (NEURONTIN ) 300 MG capsule Take 1 capsule (300 mg total) by mouth 3 (three) times daily.     hydrochlorothiazide (MICROZIDE) 12.5 MG capsule Take 12.5 mg by mouth daily.     loperamide  (IMODIUM ) 2 MG capsule Take 1 capsule (2 mg total) by mouth 4 (four) times daily as needed for diarrhea or loose stools. 12 capsule 0   losartan  (COZAAR ) 100 MG tablet Take 100 mg by mouth daily.     memantine  (NAMENDA ) 10 MG tablet Take 10 mg by mouth 2 (two) times daily.     metFORMIN  (GLUCOPHAGE ) 500 MG tablet Take 500 mg by mouth 2 (two)  times daily.     omeprazole  (PRILOSEC) 40 MG capsule Take 1 capsule (40 mg total) by mouth 2 (two) times daily. 60 capsule 2   primidone  (MYSOLINE ) 50 MG tablet Take 25 mg by mouth at bedtime.     sertraline (ZOLOFT) 50 MG tablet Take 50 mg by mouth daily.       Discharge Medications: Please see discharge summary for a list of discharge medications.  Relevant Imaging Results:  Relevant Lab Results:   Additional Information ssn: [id number]  Noreen KATHEE [place], LCSWA

## 2024-07-08 NOTE — Evaluation (Signed)
 Physical Therapy Evaluation Patient Details Name: Laurie Mcfarland MRN: [id number] DOB: 1934/11/14 Today's Date: 07/08/2024  History of Present Illness  Laurie Mcfarland is a 89 y.o. female.        HPI  Elderly female with dementia presents via EMS after a fall.  Patient is in a nursing facility, had recent fall, has staples in place in the left parietal region.  Per EMS report patient rolled from her bed today, struck the right side of her head and sustained a wound.  No reported change in behavior, and EMS reports no hemodynamic stability and stability in transport.  Hemostasis achieved with pressure.  The patient self has dementia, level 5 caveat.  She does answer some questions about her current condition seemingly appropriately, denies pain, weakness.  She is amenable to evaluation.   Clinical Impression  Patient presents with sling to RUE and demonstrates slow labored movement for sitting at bedside requiring frequent rest breaks due to fatigue. Patient unsteady on feet and able to use RW with left hand to take a couple of steps before having to sit due to weakness and fall risk. Patient tolerated sitting up in chair after therapy with her daughters present and informed to let nursing staff know when they are leaving so nursing staff can put patient back to bed. Patient will benefit from continued skilled physical therapy in hospital and recommended venue below to increase strength, balance, endurance for safe ADLs and gait.          If plan is discharge home, recommend the following: A lot of help with walking and/or transfers;A lot of help with bathing/dressing/bathroom;Assistance with feeding;Help with stairs or ramp for entrance;Supervision due to cognitive status   Can travel by private vehicle   Yes    Equipment Recommendations None recommended by PT  Recommendations for Other Services       Functional Status Assessment Patient has had a recent decline in their functional status and  demonstrates the ability to make significant improvements in function in a reasonable and predictable amount of time.     Precautions / Restrictions Precautions Precautions: Fall Restrictions Weight Bearing Restrictions Per Provider Order: No      Mobility  Bed Mobility Overal bed mobility: Needs Assistance Bed Mobility: Supine to Sit     Supine to sit: Mod assist     General bed mobility comments: slow labored movement, unable to use RUE due to in sling    Transfers Overall transfer level: Needs assistance Equipment used: Rolling walker (2 wheels), 1 person hand held assist Transfers: Sit to/from Stand, Bed to chair/wheelchair/BSC Sit to Stand: Min assist, Mod assist   Step pivot transfers: Mod assist, Max assist            Ambulation/Gait Ambulation/Gait assistance: Mod assist, Max assist Gait Distance (Feet): 2 Feet Assistive device: 1 person hand held assist, Rolling walker (2 wheels) Gait Pattern/deviations: Decreased step length - right, Decreased step length - left, Decreased stride length, Knees buckling Gait velocity: slow     General Gait Details: able to stand and take a couple of steps using left hand on RW before having to sit due to weakness, poor standing balance  Stairs            Wheelchair Mobility     Tilt Bed    Modified Rankin (Stroke Patients Only)       Balance Overall balance assessment: Needs assistance Sitting-balance support: Feet supported, Single extremity supported Sitting balance-Leahy Scale: Fair  Sitting balance - Comments: seated at EOB   Standing balance support: During functional activity, Single extremity supported Standing balance-Leahy Scale: Poor Standing balance comment: using RW or holding onto armrest of chair using LUE                             Pertinent Vitals/Pain Pain Assessment Pain Assessment: Faces Faces Pain Scale: Hurts little more Pain Location: RUE, head/neck Pain  Descriptors / Indicators: Discomfort, Grimacing, Sore Pain Intervention(s): Limited activity within patient's tolerance, Monitored during session, Repositioned    Home Living Family/patient expects to be discharged to:: Assisted living                 Home Equipment: Agricultural Consultant (2 wheels);Wheelchair - manual;Cane - single point;Shower seat      Prior Function Prior Level of Function : Needs assist       Physical Assist : Mobility (physical);ADLs (physical) Mobility (physical): Bed mobility;Transfers ADLs (physical): Feeding;Grooming;Bathing;Dressing;Toileting;IADLs Mobility Comments: assisted for transfers, short distances using RW, uses wheelchair mostly ADLs Comments: Assisted by ALF staff     Extremity/Trunk Assessment   Upper Extremity Assessment Upper Extremity Assessment: Generalized weakness;RUE deficits/detail RUE Deficits / Details: not tested RUE: Unable to fully assess due to pain;Unable to fully assess due to immobilization RUE Sensation: WNL RUE Coordination: decreased fine motor;decreased gross motor    Lower Extremity Assessment Lower Extremity Assessment: Generalized weakness    Cervical / Trunk Assessment Cervical / Trunk Assessment: Kyphotic  Communication   Communication Communication: No apparent difficulties    Cognition Arousal: Alert Behavior During Therapy: WFL for tasks assessed/performed, Anxious   PT - Cognitive impairments: History of cognitive impairments                         Following commands: Impaired Following commands impaired: Follows one step commands with increased time     Cueing Cueing Techniques: Verbal cues, Tactile cues     General Comments      Exercises     Assessment/Plan    PT Assessment Patient needs continued PT services  PT Problem List Decreased strength;Decreased activity tolerance;Decreased range of motion;Decreased balance;Decreased mobility;Decreased safety awareness;Decreased  knowledge of use of DME       PT Treatment Interventions DME instruction;Gait training;Stair training;Functional mobility training;Therapeutic activities;Therapeutic exercise;Balance training;Patient/family education    PT Goals (Current goals can be found in the Care Plan section)  Acute Rehab PT Goals Patient Stated Goal: return home PT Goal Formulation: With patient/family Time For Goal Achievement: 07/22/24 Potential to Achieve Goals: Good    Frequency Min 2X/week     Co-evaluation               AM-PAC PT 6 Clicks Mobility  Outcome Measure Help needed turning from your back to your side while in a flat bed without using bedrails?: A Lot Help needed moving from lying on your back to sitting on the side of a flat bed without using bedrails?: A Lot Help needed moving to and from a bed to a chair (including a wheelchair)?: A Lot Help needed standing up from a chair using your arms (e.g., wheelchair or bedside chair)?: A Lot Help needed to walk in hospital room?: A Lot Help needed climbing 3-5 steps with a railing? : Total 6 Click Score: 11    End of Session   Activity Tolerance: Patient tolerated treatment well;Patient limited by fatigue Patient left: in chair;with  family/visitor present Nurse Communication: Mobility status PT Visit Diagnosis: Unsteadiness on feet (R26.81);Repeated falls (R29.6);Other abnormalities of gait and mobility (R26.89);Muscle weakness (generalized) (M62.81)    Time: 8863-8843 PT Time Calculation (min) (ACUTE ONLY): 20 min   Charges:   PT Evaluation $PT Eval Moderate Complexity: 1 Mod PT Treatments $Therapeutic Activity: 8-22 mins           12:34 PM, 07/08/24 Lynwood Music, MPT Physical Therapist with [facility] [phone] office [phone] mobile phone

## 2024-07-08 NOTE — ED Provider Notes (Signed)
 [place] EMERGENCY DEPARTMENT AT [facility] Provider Note   CSN: [id number] Arrival date & time: 07/08/24  9278     Patient presents with: Laurie Mcfarland Laurie Mcfarland is a 89 y.o. female.   HPI Elderly female with dementia presents via EMS after a fall.  Patient is in a nursing facility, had recent fall, has staples in place in the left parietal region.  Per EMS report patient rolled from her bed today, struck the right side of her head and sustained a wound.  No reported change in behavior, and EMS reports no hemodynamic stability and stability in transport.  Hemostasis achieved with pressure. The patient self has dementia, level 5 caveat.  She does answer some questions about her current condition seemingly appropriately, denies pain, weakness.  She is amenable to evaluation.    Prior to Admission medications   Medication Sig Start Date End Date Taking? Authorizing Provider  acetaminophen  (TYLENOL ) 650 MG CR tablet Take 650 mg by mouth 3 (three) times daily.    [provider]  albuterol  (VENTOLIN  HFA) 108 (90 Base) MCG/ACT inhaler Inhale 2 puffs into the lungs every 4 (four) hours as needed.  03/28/20   [provider]  amLODipine  (NORVASC ) 10 MG tablet Take by mouth. 05/19/13   [provider]  atorvastatin  (LIPITOR) 20 MG tablet Take 1 tablet (20 mg total) by mouth daily. 03/29/20   Ricky Fines, MD  gabapentin  (NEURONTIN ) 300 MG capsule Take 1 capsule (300 mg total) by mouth 3 (three) times daily. 03/28/20   Ricky Fines, MD  hydrochlorothiazide (MICROZIDE) 12.5 MG capsule Take 12.5 mg by mouth daily. 03/03/24   [provider]  loperamide  (IMODIUM ) 2 MG capsule Take 1 capsule (2 mg total) by mouth 4 (four) times daily as needed for diarrhea or loose stools. 05/20/23   Idol, Julie, PA-C  losartan  (COZAAR ) 100 MG tablet Take 100 mg by mouth daily. 05/07/24   [provider]  memantine  (NAMENDA ) 10 MG tablet Take 10 mg by mouth 2 (two)  times daily. 03/14/20   [provider]  metFORMIN  (GLUCOPHAGE ) 500 MG tablet Take 500 mg by mouth 2 (two) times daily.    [provider]  omeprazole  (PRILOSEC) 40 MG capsule Take 1 capsule (40 mg total) by mouth 2 (two) times daily. 07/20/19   Ricky Fines, MD  primidone  (MYSOLINE ) 50 MG tablet Take 25 mg by mouth at bedtime. 06/21/19   [provider]  sertraline (ZOLOFT) 50 MG tablet Take 50 mg by mouth daily. 05/07/24   [provider]    Allergies: Penicillins    Review of Systems  Updated Vital Signs BP (!) 176/57   Pulse 60   Temp 97.8 F (36.6 C) (Oral)   Resp 18   Ht 1.575 m (5' 2)   Wt 77.1 kg   SpO2 96%   BMI 31.09 kg/m   Physical Exam Vitals and nursing note reviewed.  Constitutional:      General: She is not in acute distress.    Appearance: She is well-developed.  HENT:     Head: Normocephalic and atraumatic.  Eyes:     Conjunctiva/sclera: Conjunctivae normal.  Neck:   Cardiovascular:     Rate and Rhythm: Normal rate and regular rhythm.  Pulmonary:     Effort: Pulmonary effort is normal. No respiratory distress.     Breath sounds: Normal breath sounds. No stridor.  Abdominal:     General: There is no distension.  Musculoskeletal:  Comments: Pelvis stable, patient flexes each hip to command, has no obvious forming in the lower extremities, upper extremities.  Skin:    General: Skin is warm and dry.  Neurological:     General: No focal deficit present.     Mental Status: She is alert.     Cranial Nerves: No cranial nerve deficit.     Motor: Atrophy present.  Psychiatric:        Behavior: Behavior is slowed and withdrawn.        Cognition and Memory: Cognition is impaired. Memory is impaired.     (all labs ordered are listed, but only abnormal results are displayed) Labs Reviewed - No data to display  EKG: None  Radiology: CT Head Wo Contrast Result Date: 07/08/2024 EXAM: CT HEAD AND CERVICAL SPINE  WITHOUT CONTRAST 07/08/2024 08:28:06 AM TECHNIQUE: CT of the head and cervical spine was performed without the administration of intravenous contrast. Automated exposure control, iterative reconstruction, and/or weight based adjustment of the mA/kV was utilized to reduce the radiation dose to as low as reasonably achievable. COMPARISON: 07/03/2024 CLINICAL HISTORY: Polytrauma, blunt; Right posterior head trauma. FINDINGS: BRAIN AND VENTRICLES: Hypoattenuating foci in the cerebral white matter, most likely representing chronic small vessel disease. Prominence of the sulci and ventricles compatible with brain atrophy. Decreased layering intraventricular hemorrhage within the left occipital horn (axial image 44/2). No new areas of hemorrhage. No evidence of acute infarct. No hydrocephalus. No extra-axial collection. No mass effect or midline shift. ORBITS: No acute abnormality. SINUSES: Partial opacification of the sphenoid sinus, unchanged. SOFT TISSUES AND SKULL: New skin staples overlying the left posterior calvarium corresponding to previous scalp laceration. No skull fracture. CERVICAL SPINE: No acute posttraumatic malalignment of the cervical spine. No signs of cervical spine fracture or subluxation. . Unchanged appearance of 1st degree anterolisthesis of C3 on C4 and C7 on T1. Multilevel disc space narrowing and endplate spurring are noted, compatible with spondylosis. Bilateral facet arthropathy. Straightening of normal cervical lordosis may reflect muscle spasm or patient positioning. IMPRESSION: 1. No acute intracranial abnormality related to trauma. 2. Decreased layering intraventricular hemorrhage within the left occipital horn, with no new hemorrhage. 3. No acute cervical spine fracture or malalignment. 4. Multilevel cervical spondylosis Electronically signed by: Waddell Calk MD 07/08/2024 08:41 AM EST RP Workstation: [id number]   CT Cervical Spine Wo Contrast Result Date: 07/08/2024 EXAM: CT HEAD AND  CERVICAL SPINE WITHOUT CONTRAST 07/08/2024 08:28:06 AM TECHNIQUE: CT of the head and cervical spine was performed without the administration of intravenous contrast. Automated exposure control, iterative reconstruction, and/or weight based adjustment of the mA/kV was utilized to reduce the radiation dose to as low as reasonably achievable. COMPARISON: 07/03/2024 CLINICAL HISTORY: Polytrauma, blunt; Right posterior head trauma. FINDINGS: BRAIN AND VENTRICLES: Hypoattenuating foci in the cerebral white matter, most likely representing chronic small vessel disease. Prominence of the sulci and ventricles compatible with brain atrophy. Decreased layering intraventricular hemorrhage within the left occipital horn (axial image 44/2). No new areas of hemorrhage. No evidence of acute infarct. No hydrocephalus. No extra-axial collection. No mass effect or midline shift. ORBITS: No acute abnormality. SINUSES: Partial opacification of the sphenoid sinus, unchanged. SOFT TISSUES AND SKULL: New skin staples overlying the left posterior calvarium corresponding to previous scalp laceration. No skull fracture. CERVICAL SPINE: No acute posttraumatic malalignment of the cervical spine. No signs of cervical spine fracture or subluxation. . Unchanged appearance of 1st degree anterolisthesis of C3 on C4 and C7 on T1. Multilevel disc space narrowing and  endplate spurring are noted, compatible with spondylosis. Bilateral facet arthropathy. Straightening of normal cervical lordosis may reflect muscle spasm or patient positioning. IMPRESSION: 1. No acute intracranial abnormality related to trauma. 2. Decreased layering intraventricular hemorrhage within the left occipital horn, with no new hemorrhage. 3. No acute cervical spine fracture or malalignment. 4. Multilevel cervical spondylosis Electronically signed by: Waddell Calk MD 07/08/2024 08:41 AM EST RP Workstation: [id number]     Procedures   Medications Ordered in the ED - No data  to display                                  Medical Decision Making Elderly female with head trauma open wound presents via EMS.  Patient's initial vital signs are reassuring, neuroexam reassuring, she is noted to have a history of dementia.  Concern for fracture versus intracranial abnormality versus laceration as well as cervical spine disruption. Imaging ordered  Amount and/or Complexity of Data Reviewed Independent Historian: EMS External Data Reviewed: notes. Radiology: ordered and independent interpretation performed. Decision-making details documented in ED Course.  Risk Decision regarding hospitalization. Diagnosis or treatment significantly limited by social determinants of health.  LACERATION REPAIR Performed by: Lamar Salen Authorized by: Lamar Salen Consent: Verbal consent obtained. Risks and benefits: risks, benefits and alternatives were discussed Consent given by: patient Patient identity confirmed: provided demographic data Prepped and Draped in normal sterile fashion Wound explored  Laceration Location: L parietal scalp  Laceration Length: 5cm  No Foreign Bodies seen or palpated   Irrigation method: syringe Amount of cleaning: standard  Skin closure: staples  Number of staples 3  Technique: close approximation  Patient tolerance: Patient tolerated the procedure well with no immediate complications.  9:33 AM Patient tolerated staple repair unremarkably.  I reviewed the CT, evidence for improved patient tolerated staple repair without complication.  CT reviewed, discussed, though her dementia is limiting.  Patient with evidence for resolving prior intracranial hemorrhage no evidence for acute new bleed, fracture.  Final diagnoses:  Fall, initial encounter  Injury of head, initial encounter    ED Discharge Orders     None          Salen Lamar, MD 07/08/24 [phone]

## 2024-07-08 NOTE — Plan of Care (Signed)
  Problem: Acute Rehab PT Goals(only PT should resolve) Goal: Pt will Roll Supine to Side Outcome: Progressing Flowsheets (Taken 07/08/2024 1236) Pt will Roll Supine to Side:  with min assist  with mod assist Goal: Patient Will Transfer Sit To/From Stand Outcome: Progressing Flowsheets (Taken 07/08/2024 1236) Patient will transfer sit to/from stand:  with minimal assist  with moderate assist Goal: Pt Will Transfer Bed To Chair/Chair To Bed Outcome: Progressing Flowsheets (Taken 07/08/2024 1236) Pt will Transfer Bed to Chair/Chair to Bed:  with min assist  with mod assist Goal: Pt Will Ambulate Outcome: Progressing Flowsheets (Taken 07/08/2024 1236) Pt will Ambulate:  15 feet  with moderate assist  with cane  with rolling walker   12:36 PM, 07/08/24 Lynwood Music, MPT Physical Therapist with [facility] [phone] office [phone] mobile phone

## 2024-07-08 NOTE — ED Notes (Signed)
 Cleaned dried blood from right side of the head.

## 2024-07-08 NOTE — Discharge Instructions (Addendum)
 Today's evaluation has been reassuring.  However, your staples need to be removed in 5 days by your physician.  Return here for concerning changes in your condition.

## 2024-07-09 NOTE — ED Notes (Signed)
 Pt soiled bed. Pt cleaned and placed on clean linen and given warm blanket. Bed lowered and locked with call bell in reach. Pt NAD. Watching TV at this time.

## 2024-07-09 NOTE — ED Provider Notes (Signed)
 Emergency Medicine Observation Re-evaluation Note  Laurie Mcfarland is a 89 y.o. female, seen on rounds today.  Pt initially presented to the ED for complaints of Fall Currently, the patient is resting comfortably  Physical Exam  BP (!) 128/43   Pulse (!) 55   Temp 98 F (36.7 C) (Oral)   Resp 20   Ht 5' 2 (1.575 m)   Wt 77.1 kg   SpO2 98%   BMI 31.09 kg/m  Physical Exam General: NAD Lungs: No respiratory distress Psych: Calm, cooperative  ED Course / MDM  EKG:   I have reviewed the labs performed to date as well as medications administered while in observation.  Recent changes in the last 24 hours include patient not able to go back to her previous nursing home. PT, OT, SW evaluated patient.   Plan  Current plan is for [facility] working on placement     Gennaro Duwaine CROME, DO 07/09/24 9088

## 2024-07-09 NOTE — ED Notes (Signed)
 This RN went into pts room to complete assessment and pt was on the bed pan and asked RN to come back.

## 2024-07-09 NOTE — ED Provider Notes (Signed)
 ED Course Summary   89 year old female was seen in the ER yesterday for a fall at her nursing facility. Had a laceration on her scalp repaired with staples. She had negative CT imaging of her head and neck. She was otherwise stable, but do to more frequent falls her nursing home was unable to accept her back. Physical therapy evaluated the patient and recommended continuing PT and acute rehab services. Social work evaluated the patient as well and helped to assist in finding rehab placement for this patient. None of her current medications were adjusted in the ER.    Gennaro Bouchard L, DO 07/09/24 1022

## 2024-07-09 NOTE — ED Notes (Signed)
 Pt provided peri care, brief changed, bed pads changed

## 2024-07-09 NOTE — ED Provider Notes (Addendum)
 To whom it May Concern:   DOB: [birth year]  Please be advised that the above name patient will require a short-term nursing home stay- anticipated 30 days or less rehabilitation and strengthening. The plan is for return home.    Gennaro Duwaine CROME, DO 07/09/24 1019    Lesslie Mckeehan L, DO 07/09/24 1141

## 2024-07-09 NOTE — ED Notes (Cosign Needed)
30 Day Note  To whom it May Concern: Please be advised that the above name patient will require a short-term nursing home stay- anticipated 30 days or less rehabilitation and strengthening. The plan is for return home.

## 2024-07-09 NOTE — ED Notes (Signed)
 Pt daughter updated via incoming call. Pt daughter inquired about hallway beds. RN educated. PT daughter communicated understanding.

## 2024-07-09 NOTE — ED Notes (Signed)
 Pt placed on bed alarm

## 2024-07-09 NOTE — ED Notes (Signed)
 This RN and another RN found the pt to have removed her brief and had urinated on herself and bed. We cleaned the pt and all bedding and replaced it with all clean items. Pts bed alarm is set and the pt is watching tv with call bed on bed rail and blankets covering her up.

## 2024-07-09 NOTE — ED Notes (Signed)
 Pt able to use bedpan to void, once taken off BP pt then had a stool occurrence, pt cleaned up and linens changed

## 2024-07-09 NOTE — ED Notes (Signed)
 CSW is awaiting for patient Laurie Mcfarland to come back. CSW spoke with Laurie Mcfarland, patient Laurie Mcfarland and made her aware on what was needed before DC. Patient Laurie Mcfarland has been approved. Laurie Mcfarland with [facility] updated , Laurie Mcfarland shared that she will have a bed tomorrow for patient. [facility] was updated on DC. CSW will continue to follow.

## 2024-07-09 NOTE — ED Notes (Signed)
 Pt had bowel movement. Brief changed, peri care performed, purewick changed, pt comfortable and resting, family at bedside.

## 2024-07-09 NOTE — ED Notes (Signed)
 Pt readjusted in bed and blankets rearranged.

## 2024-07-09 NOTE — ED Notes (Signed)
 Pt daughter provided recliner due to physical disability

## 2024-07-09 NOTE — ED Notes (Signed)
 Upon an head to toe assessment of the pt the pt was alert to her person only. Family at bedside endorsed that this is normal for her.  Pts cardiac and respiratory assessment was WNL. The Pt endorsed pain on the LLQ upon palpation audible bowel sounds heard in all 4 quadrants. No swelling noted in upper or lower extremities. No wincing noted during assessment expect for the LLQ. Pt radial pulses were strong with less than 3 sec cap refill.

## 2024-07-10 NOTE — ED Notes (Signed)
 CSW received patient PASSAR back this AM. CSW then reached out to [place] at [facility] about PASSAR and Auth. Debbie provided room and report number. CSW provided information to nurse and asked MD for AVS. [facility] crisis line was notified of DC today. CSW signing off.

## 2024-07-10 NOTE — ED Notes (Signed)
 Patient ate about 8 bites of breakfast and drank half of her milk.

## 2024-07-10 NOTE — ED Provider Notes (Signed)
 Emergency Medicine Observation Re-evaluation Note  Laurie Mcfarland is a 89 y.o. female, seen on rounds today.  Pt initially presented to the ED for complaints of Fall Currently, the patient is resting comfortably. No acute events overnight.   Physical Exam  BP (!) 150/63   Pulse (!) 57   Temp 98.1 F (36.7 C) (Oral)   Resp 17   Ht 5' 2 (1.575 m)   Wt 77.1 kg   SpO2 96%   BMI 31.09 kg/m  Physical Exam General: NAD Lungs: No respiratory distress Psych: Calm, cooperative  ED Course / MDM  EKG:   I have reviewed the labs performed to date as well as medications administered while in observation.  Recent changes in the last 24 hours include patient will be dc today.   Plan  Current plan is for dc today to nursing facility. Should have staples removed 5 days from 07/08/2024.    Gennaro Duwaine CROME, DO 07/10/24 1003

## 2024-07-11 ENCOUNTER — Other Ambulatory Visit: Payer: Self-pay

## 2024-07-11 ENCOUNTER — Inpatient Hospital Stay (HOSPITAL_COMMUNITY)
Admission: EM | Admit: 2024-07-11 | Discharge: 2024-07-14 | DRG: 689 | Disposition: A | Discharge status: Skilled Nursing Facility | Source: Skilled Nursing Facility | Attending: Internal Medicine | Admitting: Internal Medicine

## 2024-07-11 ENCOUNTER — Emergency Department (HOSPITAL_COMMUNITY)

## 2024-07-11 ENCOUNTER — Encounter (HOSPITAL_COMMUNITY): Payer: Self-pay | Admitting: Emergency Medicine

## 2024-07-11 DIAGNOSIS — Z833 Family history of diabetes mellitus: Secondary | ICD-10-CM

## 2024-07-11 DIAGNOSIS — F0394 Unspecified dementia, unspecified severity, with anxiety: Secondary | ICD-10-CM | POA: Diagnosis present

## 2024-07-11 DIAGNOSIS — W1830XA Fall on same level, unspecified, initial encounter: Secondary | ICD-10-CM | POA: Diagnosis present

## 2024-07-11 DIAGNOSIS — M797 Fibromyalgia: Secondary | ICD-10-CM | POA: Diagnosis present

## 2024-07-11 DIAGNOSIS — Z79899 Other long term (current) drug therapy: Secondary | ICD-10-CM

## 2024-07-11 DIAGNOSIS — Y92129 Unspecified place in nursing home as the place of occurrence of the external cause: Secondary | ICD-10-CM

## 2024-07-11 DIAGNOSIS — E1122 Type 2 diabetes mellitus with diabetic chronic kidney disease: Secondary | ICD-10-CM | POA: Diagnosis present

## 2024-07-11 DIAGNOSIS — I129 Hypertensive chronic kidney disease with stage 1 through stage 4 chronic kidney disease, or unspecified chronic kidney disease: Secondary | ICD-10-CM | POA: Diagnosis present

## 2024-07-11 DIAGNOSIS — F0393 Unspecified dementia, unspecified severity, with mood disturbance: Secondary | ICD-10-CM | POA: Diagnosis present

## 2024-07-11 DIAGNOSIS — S0101XA Laceration without foreign body of scalp, initial encounter: Secondary | ICD-10-CM | POA: Diagnosis present

## 2024-07-11 DIAGNOSIS — Z7984 Long term (current) use of oral hypoglycemic drugs: Secondary | ICD-10-CM

## 2024-07-11 DIAGNOSIS — R296 Repeated falls: Secondary | ICD-10-CM | POA: Diagnosis present

## 2024-07-11 DIAGNOSIS — R531 Weakness: Secondary | ICD-10-CM

## 2024-07-11 DIAGNOSIS — K219 Gastro-esophageal reflux disease without esophagitis: Secondary | ICD-10-CM | POA: Diagnosis present

## 2024-07-11 DIAGNOSIS — E1142 Type 2 diabetes mellitus with diabetic polyneuropathy: Secondary | ICD-10-CM | POA: Diagnosis present

## 2024-07-11 DIAGNOSIS — W19XXXA Unspecified fall, initial encounter: Principal | ICD-10-CM | POA: Diagnosis present

## 2024-07-11 DIAGNOSIS — E785 Hyperlipidemia, unspecified: Secondary | ICD-10-CM | POA: Diagnosis present

## 2024-07-11 DIAGNOSIS — R509 Fever, unspecified: Secondary | ICD-10-CM

## 2024-07-11 DIAGNOSIS — F32A Depression, unspecified: Secondary | ICD-10-CM | POA: Diagnosis present

## 2024-07-11 DIAGNOSIS — Z1623 Resistance to quinolones and fluoroquinolones: Secondary | ICD-10-CM | POA: Diagnosis present

## 2024-07-11 DIAGNOSIS — Z8711 Personal history of peptic ulcer disease: Secondary | ICD-10-CM

## 2024-07-11 DIAGNOSIS — N390 Urinary tract infection, site not specified: Principal | ICD-10-CM | POA: Diagnosis present

## 2024-07-11 DIAGNOSIS — Z90710 Acquired absence of both cervix and uterus: Secondary | ICD-10-CM

## 2024-07-11 DIAGNOSIS — Z87891 Personal history of nicotine dependence: Secondary | ICD-10-CM

## 2024-07-11 DIAGNOSIS — S06350A Traumatic hemorrhage of left cerebrum without loss of consciousness, initial encounter: Secondary | ICD-10-CM | POA: Diagnosis present

## 2024-07-11 DIAGNOSIS — Z820 Family history of epilepsy and other diseases of the nervous system: Secondary | ICD-10-CM

## 2024-07-11 DIAGNOSIS — N1832 Chronic kidney disease, stage 3b: Secondary | ICD-10-CM | POA: Diagnosis present

## 2024-07-11 DIAGNOSIS — B9620 Unspecified Escherichia coli [E. coli] as the cause of diseases classified elsewhere: Secondary | ICD-10-CM | POA: Diagnosis present

## 2024-07-11 LAB — CBC WITH DIFFERENTIAL/PLATELET
Abs Immature Granulocytes: 0.02 K/uL (ref 0.00–0.07)
Basophils Absolute: 0 K/uL (ref 0.0–0.1)
Basophils Relative: 0 %
Eosinophils Absolute: 0 K/uL (ref 0.0–0.5)
Eosinophils Relative: 0 %
HCT: 34.9 % — ABNORMAL LOW (ref 36.0–46.0)
Hemoglobin: 11.5 g/dL — ABNORMAL LOW (ref 12.0–15.0)
Immature Granulocytes: 0 %
Lymphocytes Relative: 10 %
Lymphs Abs: 1 K/uL (ref 0.7–4.0)
MCH: 29.9 pg (ref 26.0–34.0)
MCHC: 33 g/dL (ref 30.0–36.0)
MCV: 90.6 fL (ref 80.0–100.0)
Monocytes Absolute: 1.2 K/uL — ABNORMAL HIGH (ref 0.1–1.0)
Monocytes Relative: 12 %
Neutro Abs: 7.5 K/uL (ref 1.7–7.7)
Neutrophils Relative %: 78 %
Platelets: 238 K/uL (ref 150–400)
RBC: 3.85 MIL/uL — ABNORMAL LOW (ref 3.87–5.11)
RDW: 13.7 % (ref 11.5–15.5)
WBC: 9.7 K/uL (ref 4.0–10.5)
nRBC: 0 % (ref 0.0–0.2)

## 2024-07-11 LAB — RESP PANEL BY RT-PCR (RSV, FLU A&B, COVID)  RVPGX2
Influenza A by PCR: NEGATIVE
Influenza B by PCR: NEGATIVE
Resp Syncytial Virus by PCR: NEGATIVE
SARS Coronavirus 2 by RT PCR: NEGATIVE

## 2024-07-11 LAB — URINALYSIS, ROUTINE W REFLEX MICROSCOPIC
Bilirubin Urine: NEGATIVE
Glucose, UA: NEGATIVE mg/dL
Ketones, ur: NEGATIVE mg/dL
Nitrite: POSITIVE — AB
Protein, ur: 100 mg/dL — AB
Specific Gravity, Urine: 1.019 (ref 1.005–1.030)
pH: 5 (ref 5.0–8.0)

## 2024-07-11 LAB — COMPREHENSIVE METABOLIC PANEL WITH GFR
ALT: 13 U/L (ref 0–44)
AST: 19 U/L (ref 15–41)
Albumin: 4.3 g/dL (ref 3.5–5.0)
Alkaline Phosphatase: 84 U/L (ref 38–126)
Anion gap: 13 (ref 5–15)
BUN: 24 mg/dL — ABNORMAL HIGH (ref 8–23)
CO2: 28 mmol/L (ref 22–32)
Calcium: 9.6 mg/dL (ref 8.9–10.3)
Chloride: 99 mmol/L (ref 98–111)
Creatinine, Ser: 1.28 mg/dL — ABNORMAL HIGH (ref 0.44–1.00)
GFR, Estimated: 40 mL/min — ABNORMAL LOW (ref >60)
Glucose, Bld: 144 mg/dL — ABNORMAL HIGH (ref 70–99)
Potassium: 4.1 mmol/L (ref 3.5–5.1)
Sodium: 139 mmol/L (ref 135–145)
Total Bilirubin: 0.3 mg/dL (ref 0.0–1.2)
Total Protein: 7.6 g/dL (ref 6.5–8.1)

## 2024-07-11 LAB — TROPONIN T, HIGH SENSITIVITY: Troponin T High Sensitivity: 35 ng/L — ABNORMAL HIGH (ref 0–19)

## 2024-07-11 LAB — LACTIC ACID, PLASMA: Lactic Acid, Venous: 1.2 mmol/L (ref 0.5–1.9)

## 2024-07-11 MED ORDER — LACTATED RINGERS IV BOLUS
1000.0000 mL | Freq: Once | INTRAVENOUS | Status: AC
  Administered 2024-07-11: 1000 mL via INTRAVENOUS

## 2024-07-11 MED ORDER — SODIUM CHLORIDE 0.9 % IV SOLN
1.0000 g | Freq: Once | INTRAVENOUS | Status: AC
  Administered 2024-07-11: 1 g via INTRAVENOUS
  Filled 2024-07-11: qty 10

## 2024-07-11 MED ORDER — ACETAMINOPHEN 325 MG PO TABS
650.0000 mg | ORAL_TABLET | Freq: Once | ORAL | Status: AC
  Administered 2024-07-11: 650 mg via ORAL
  Filled 2024-07-11: qty 2

## 2024-07-11 NOTE — ED Triage Notes (Signed)
 Pt bib [facility] from [facility] from unwitnessed fall. Pt has dementia and no complaints.

## 2024-07-11 NOTE — ED Provider Notes (Signed)
 Patient presenting for unwitnessed fall at nursing facility.  She has no acute injuries but was found to have a fever of unknown origin while in the ED.  Further workup pending. Physical Exam  BP (!) 166/75   Pulse 90   Temp (!) 101.8 F (38.8 C) (Rectal)   Resp 16   Ht 5' 2 (1.575 m)   Wt 77 kg   SpO2 96%   BMI 31.05 kg/m   Physical Exam Vitals and nursing note reviewed.  Constitutional:      General: She is not in acute distress.    Appearance: Normal appearance. She is well-developed. She is not ill-appearing, toxic-appearing or diaphoretic.  HENT:     Head: Normocephalic and atraumatic.     Right Ear: External ear normal.     Left Ear: External ear normal.     Nose: Nose normal.     Mouth/Throat:     Mouth: Mucous membranes are moist.  Eyes:     Extraocular Movements: Extraocular movements intact.     Conjunctiva/sclera: Conjunctivae normal.  Cardiovascular:     Rate and Rhythm: Normal rate and regular rhythm.  Pulmonary:     Effort: Pulmonary effort is normal. No respiratory distress.  Abdominal:     General: There is no distension.     Palpations: Abdomen is soft.     Tenderness: There is no abdominal tenderness.  Musculoskeletal:        General: No swelling. Normal range of motion.     Cervical back: Normal range of motion and neck supple.  Skin:    General: Skin is warm and dry.     Coloration: Skin is not jaundiced or pale.  Neurological:     General: No focal deficit present.     Mental Status: She is alert. Mental status is at baseline. She is disoriented.  Psychiatric:        Mood and Affect: Mood normal.        Behavior: Behavior normal.     Procedures  Procedures  ED Course / MDM   Clinical Course as of 07/11/24 2335  [place] Jul 11, 2024  2302 Laceration sites to the posterior and right lateral aspects of the scalp well-healing at this time with staples still in place.  They are not due to come out for another 2 days. [CR]    Clinical Course  User Index [CR] Daralene Lonni BIRCH, PA-C   Medical Decision Making Amount and/or Complexity of Data Reviewed Labs: ordered. Radiology: ordered.  Risk OTC drugs.   On assessment, patient resting comfortably.  Her urinalysis does show evidence of UTI.  Dose of ceftriaxone  was previously ordered.  Per chart review, patient underwent physical therapy evaluation 4 days ago.  At the time, she was able to take a few steps while holding onto a walker with her left hand.  Patient is currently unable to stand even with assistance.  I suspect generalized weakness secondary to a UTI.  Patient to be admitted for further management.       Melvenia Motto, MD 07/12/24 [id number]

## 2024-07-11 NOTE — ED Provider Notes (Signed)
 [place] EMERGENCY DEPARTMENT AT [facility] Provider Note   CSN: [id number] Arrival date & time: 07/11/24  2148     Patient presents with: Laurie Mcfarland is a 89 y.o. female.   Patient is an 89 year old female with a past medical history of dementia who presents to the emergency department from her long-term care facility secondary to an unwitnessed ground-level fall.  Patient only provides limited history given her dementia and is unsure of exactly how she fell.  She denies any active complaints at this time.  Patient was recently seen in the emergency department approximately 3 days ago and did have a ground-level fall with a scalp laceration which did require staples which are still in place at this time.  Patient denies any active pain to neck or back.  She denies any pain to her chest or abdomen.  She denies any other long bone or joint pain at this time.  She was noted to be febrile on presentation.   Fall       Prior to Admission medications   Medication Sig Start Date End Date Taking? Authorizing Provider  acetaminophen  (TYLENOL ) 650 MG CR tablet Take 650 mg by mouth 3 (three) times daily.    [provider]  albuterol  (VENTOLIN  HFA) 108 (90 Base) MCG/ACT inhaler Inhale 2 puffs into the lungs every 4 (four) hours as needed.  03/28/20   [provider]  amLODipine  (NORVASC ) 10 MG tablet Take by mouth. 05/19/13   [provider]  atorvastatin  (LIPITOR) 20 MG tablet Take 1 tablet (20 mg total) by mouth daily. 03/29/20   Ricky Fines, MD  Bismuth Subsalicylate (STOMACH RELIEF PO) Take 30 mLs by mouth daily as needed. Take 30 ml by mouth every 30 minutes to 1 hour as needed for diarrhea. Do Not Exceed 8 doses in 24 hours.    [provider]  gabapentin  (NEURONTIN ) 300 MG capsule Take 1 capsule (300 mg total) by mouth 3 (three) times daily. 03/28/20   Ricky Fines, MD  hydrochlorothiazide (MICROZIDE) 12.5 MG capsule Take 12.5 mg  by mouth daily. 03/03/24   [provider]  loperamide  (IMODIUM ) 2 MG capsule Take 1 capsule (2 mg total) by mouth 4 (four) times daily as needed for diarrhea or loose stools. 05/20/23   Idol, Julie, PA-C  losartan  (COZAAR ) 100 MG tablet Take 100 mg by mouth daily. 05/07/24   [provider]  memantine  (NAMENDA ) 10 MG tablet Take 10 mg by mouth 2 (two) times daily. 03/14/20   [provider]  metFORMIN  (GLUCOPHAGE ) 500 MG tablet Take 500 mg by mouth 2 (two) times daily.    [provider]  omeprazole  (PRILOSEC) 40 MG capsule Take 1 capsule (40 mg total) by mouth 2 (two) times daily. 07/20/19   Ricky Fines, MD  primidone  (MYSOLINE ) 50 MG tablet Take 25 mg by mouth at bedtime. 06/21/19   [provider]  sertraline (ZOLOFT) 50 MG tablet Take 50 mg by mouth daily. 05/07/24   [provider]    Allergies: Penicillins    Review of Systems  Constitutional:  Positive for fever.  All other systems reviewed and are negative.   Updated Vital Signs BP 136/60   Pulse 89   Temp (!) 101.8 F (38.8 C) (Rectal)   Resp 20   Ht 5' 2 (1.575 m)   Wt 77 kg   SpO2 94%   BMI 31.05 kg/m   Physical Exam Vitals and nursing note reviewed.  Constitutional:      General: She is not in acute distress.    Appearance: Normal appearance. She is not ill-appearing.  HENT:     Head: Normocephalic and atraumatic.     Nose: Nose normal.     Mouth/Throat:     Mouth: Mucous membranes are moist.  Eyes:     Extraocular Movements: Extraocular movements intact.     Conjunctiva/sclera: Conjunctivae normal.     Pupils: Pupils are equal, round, and reactive to light.  Cardiovascular:     Rate and Rhythm: Normal rate and regular rhythm.     Pulses: Normal pulses.     Heart sounds: Normal heart sounds. No murmur heard.    No gallop.  Pulmonary:     Effort: Pulmonary effort is normal. No respiratory distress.     Breath sounds: Normal breath sounds. No stridor. No  wheezing, rhonchi or rales.  Abdominal:     General: Abdomen is flat. Bowel sounds are normal. There is no distension.     Palpations: Abdomen is soft.     Tenderness: There is no abdominal tenderness. There is no guarding.  Musculoskeletal:        General: Normal range of motion.     Cervical back: Normal range of motion and neck supple. No rigidity or tenderness.     Comments: Nontender to palpation of her bilateral upper and lower extremities, healing bruises noted to bilateral lower extremities, pelvis stable to AP and lateral compression, pelvis stable to AP and lateral compression, DP and PT pulses are 2+ distally bilateral lower extremities, radial pulse 2+ distally bilateral upper extremities, full range of motion noted throughout, no obvious deformity, no skin breakdown or ulceration, no lacerations or abrasions, nontender to palpation over thoracic or lumbar spine  Skin:    General: Skin is warm and dry.  Neurological:     General: No focal deficit present.     Mental Status: She is alert. Mental status is at baseline. She is disoriented.     Cranial Nerves: No cranial nerve deficit.     Sensory: No sensory deficit.     Coordination: Coordination normal.  Psychiatric:        Mood and Affect: Mood normal.        Behavior: Behavior normal.        Thought Content: Thought content normal.        Judgment: Judgment normal.     (all labs ordered are listed, but only abnormal results are displayed) Labs Reviewed  CBC WITH DIFFERENTIAL/PLATELET - Abnormal; Notable for the following components:      Result Value   RBC 3.85 (*)    Hemoglobin 11.5 (*)    HCT 34.9 (*)    Monocytes Absolute 1.2 (*)    All other components within normal limits  URINE CULTURE  RESP PANEL BY RT-PCR (RSV, FLU A&B, COVID)  RVPGX2  COMPREHENSIVE METABOLIC PANEL WITH GFR  LACTIC ACID, PLASMA  LACTIC ACID, PLASMA  URINALYSIS, ROUTINE W REFLEX MICROSCOPIC  TROPONIN T, HIGH SENSITIVITY     EKG: None  Radiology: No results found.   Procedures   Medications Ordered in the ED  lactated ringers  bolus 1,000 mL (has no administration in time range)  cefTRIAXone  (ROCEPHIN ) 1 g in sodium chloride  0.9 % 100 mL IVPB (has no administration in time range)    Clinical Course as of 07/11/24 2337  Sun Jul 11, 2024  2302 Laceration sites to the posterior and right lateral aspects of the  scalp well-healing at this time with staples still in place.  They are not due to come out for another 2 days. [CR]    Clinical Course User Index [CR] Daralene Lonni BIRCH, PA-C                                 Medical Decision Making Patient does remain stable at this time.  Patient did have CT scan of the head demonstrated intraventricular hemorrhage consistent with previous with no acute changes.  She was nontender palpation over bilateral upper and lower extremities.  She had no tenderness over thoracic or lumbar spine.  CT scan of the cervical spine was unremarkable.  She did have fever of unknown origin on presentation and is currently undergoing workup for this.  Awaiting results of urinalysis and respiratory panel.  Will need repeat troponin.  Will sign patient out to Dr. Melvenia at shift change.  Amount and/or Complexity of Data Reviewed Labs: ordered. Radiology: ordered.  Risk OTC drugs.        Final diagnoses:  None    ED Discharge Orders     None          Daralene Lonni BIRCH DEVONNA 07/11/24 2338    Franklyn Sid SAILOR, MD 07/13/24 [phone]

## 2024-07-12 ENCOUNTER — Encounter (HOSPITAL_COMMUNITY): Payer: Self-pay | Admitting: Internal Medicine

## 2024-07-12 ENCOUNTER — Ambulatory Visit: Admitting: Orthopedic Surgery

## 2024-07-12 DIAGNOSIS — Z8711 Personal history of peptic ulcer disease: Secondary | ICD-10-CM | POA: Diagnosis not present

## 2024-07-12 DIAGNOSIS — N390 Urinary tract infection, site not specified: Secondary | ICD-10-CM | POA: Diagnosis present

## 2024-07-12 DIAGNOSIS — M797 Fibromyalgia: Secondary | ICD-10-CM | POA: Diagnosis present

## 2024-07-12 DIAGNOSIS — Z87891 Personal history of nicotine dependence: Secondary | ICD-10-CM | POA: Diagnosis not present

## 2024-07-12 DIAGNOSIS — S0101XA Laceration without foreign body of scalp, initial encounter: Secondary | ICD-10-CM | POA: Diagnosis present

## 2024-07-12 DIAGNOSIS — B9620 Unspecified Escherichia coli [E. coli] as the cause of diseases classified elsewhere: Secondary | ICD-10-CM | POA: Diagnosis present

## 2024-07-12 DIAGNOSIS — S0101XD Laceration without foreign body of scalp, subsequent encounter: Secondary | ICD-10-CM | POA: Diagnosis not present

## 2024-07-12 DIAGNOSIS — Z7984 Long term (current) use of oral hypoglycemic drugs: Secondary | ICD-10-CM | POA: Diagnosis not present

## 2024-07-12 DIAGNOSIS — F0394 Unspecified dementia, unspecified severity, with anxiety: Secondary | ICD-10-CM | POA: Diagnosis present

## 2024-07-12 DIAGNOSIS — E1122 Type 2 diabetes mellitus with diabetic chronic kidney disease: Secondary | ICD-10-CM | POA: Diagnosis present

## 2024-07-12 DIAGNOSIS — Y92129 Unspecified place in nursing home as the place of occurrence of the external cause: Secondary | ICD-10-CM | POA: Diagnosis not present

## 2024-07-12 DIAGNOSIS — Z833 Family history of diabetes mellitus: Secondary | ICD-10-CM | POA: Diagnosis not present

## 2024-07-12 DIAGNOSIS — K219 Gastro-esophageal reflux disease without esophagitis: Secondary | ICD-10-CM | POA: Diagnosis present

## 2024-07-12 DIAGNOSIS — I10 Essential (primary) hypertension: Secondary | ICD-10-CM

## 2024-07-12 DIAGNOSIS — F0393 Unspecified dementia, unspecified severity, with mood disturbance: Secondary | ICD-10-CM | POA: Diagnosis present

## 2024-07-12 DIAGNOSIS — Z90710 Acquired absence of both cervix and uterus: Secondary | ICD-10-CM | POA: Diagnosis not present

## 2024-07-12 DIAGNOSIS — N3000 Acute cystitis without hematuria: Secondary | ICD-10-CM

## 2024-07-12 DIAGNOSIS — F32A Depression, unspecified: Secondary | ICD-10-CM | POA: Diagnosis present

## 2024-07-12 DIAGNOSIS — W1830XA Fall on same level, unspecified, initial encounter: Secondary | ICD-10-CM | POA: Diagnosis present

## 2024-07-12 DIAGNOSIS — R296 Repeated falls: Secondary | ICD-10-CM | POA: Diagnosis present

## 2024-07-12 DIAGNOSIS — W19XXXA Unspecified fall, initial encounter: Secondary | ICD-10-CM

## 2024-07-12 DIAGNOSIS — N1832 Chronic kidney disease, stage 3b: Secondary | ICD-10-CM | POA: Diagnosis present

## 2024-07-12 DIAGNOSIS — Z820 Family history of epilepsy and other diseases of the nervous system: Secondary | ICD-10-CM | POA: Diagnosis not present

## 2024-07-12 DIAGNOSIS — E785 Hyperlipidemia, unspecified: Secondary | ICD-10-CM | POA: Diagnosis present

## 2024-07-12 DIAGNOSIS — Z1623 Resistance to quinolones and fluoroquinolones: Secondary | ICD-10-CM | POA: Diagnosis present

## 2024-07-12 DIAGNOSIS — I129 Hypertensive chronic kidney disease with stage 1 through stage 4 chronic kidney disease, or unspecified chronic kidney disease: Secondary | ICD-10-CM | POA: Diagnosis present

## 2024-07-12 DIAGNOSIS — Z79899 Other long term (current) drug therapy: Secondary | ICD-10-CM | POA: Diagnosis not present

## 2024-07-12 DIAGNOSIS — E1142 Type 2 diabetes mellitus with diabetic polyneuropathy: Secondary | ICD-10-CM | POA: Diagnosis present

## 2024-07-12 DIAGNOSIS — S06350A Traumatic hemorrhage of left cerebrum without loss of consciousness, initial encounter: Secondary | ICD-10-CM | POA: Diagnosis present

## 2024-07-12 DIAGNOSIS — R531 Weakness: Secondary | ICD-10-CM | POA: Diagnosis present

## 2024-07-12 LAB — CBC
HCT: 34.3 % — ABNORMAL LOW (ref 36.0–46.0)
Hemoglobin: 11.3 g/dL — ABNORMAL LOW (ref 12.0–15.0)
MCH: 30.2 pg (ref 26.0–34.0)
MCHC: 32.9 g/dL (ref 30.0–36.0)
MCV: 91.7 fL (ref 80.0–100.0)
Platelets: 184 K/uL (ref 150–400)
RBC: 3.74 MIL/uL — ABNORMAL LOW (ref 3.87–5.11)
RDW: 13.9 % (ref 11.5–15.5)
WBC: 11 K/uL — ABNORMAL HIGH (ref 4.0–10.5)
nRBC: 0 % (ref 0.0–0.2)

## 2024-07-12 LAB — BASIC METABOLIC PANEL WITH GFR
Anion gap: 13 (ref 5–15)
BUN: 22 mg/dL (ref 8–23)
CO2: 26 mmol/L (ref 22–32)
Calcium: 9.7 mg/dL (ref 8.9–10.3)
Chloride: 99 mmol/L (ref 98–111)
Creatinine, Ser: 1.07 mg/dL — ABNORMAL HIGH (ref 0.44–1.00)
GFR, Estimated: 50 mL/min — ABNORMAL LOW (ref >60)
Glucose, Bld: 107 mg/dL — ABNORMAL HIGH (ref 70–99)
Potassium: 4.3 mmol/L (ref 3.5–5.1)
Sodium: 138 mmol/L (ref 135–145)

## 2024-07-12 LAB — MAGNESIUM: Magnesium: 2 mg/dL (ref 1.7–2.4)

## 2024-07-12 LAB — TROPONIN T, HIGH SENSITIVITY: Troponin T High Sensitivity: 38 ng/L — ABNORMAL HIGH (ref 0–19)

## 2024-07-12 LAB — PHOSPHORUS: Phosphorus: 3.3 mg/dL (ref 2.5–4.6)

## 2024-07-12 MED ORDER — PROCHLORPERAZINE EDISYLATE 10 MG/2ML IJ SOLN: 5.0000 mg | Freq: Four times a day (QID) | INTRAMUSCULAR | Status: DC | PRN

## 2024-07-12 MED ORDER — MELATONIN 3 MG PO TABS
6.0000 mg | ORAL_TABLET | Freq: Every evening | ORAL | Status: DC | PRN
Start: 2024-07-12 — End: 2024-07-14

## 2024-07-12 MED ORDER — SODIUM CHLORIDE 0.9 % IV SOLN
1.0000 g | INTRAVENOUS | Status: DC
  Administered 2024-07-12 – 2024-07-13 (×2): 1 g via INTRAVENOUS
  Filled 2024-07-12 (×2): qty 10

## 2024-07-12 MED ORDER — LACTATED RINGERS IV SOLN: INTRAVENOUS | Status: DC

## 2024-07-12 MED ORDER — LOSARTAN POTASSIUM 50 MG PO TABS
100.0000 mg | ORAL_TABLET | Freq: Every day | ORAL | Status: DC
  Administered 2024-07-12 – 2024-07-14 (×3): 100 mg via ORAL
  Filled 2024-07-12: qty 2
  Filled 2024-07-12: qty 4
  Filled 2024-07-12: qty 2

## 2024-07-12 MED ORDER — ACETAMINOPHEN 500 MG PO TABS
500.0000 mg | ORAL_TABLET | Freq: Four times a day (QID) | ORAL | Status: DC | PRN
Start: 2024-07-12 — End: 2024-07-14
  Administered 2024-07-14 (×2): 500 mg via ORAL
  Filled 2024-07-12 (×2): qty 1

## 2024-07-12 MED ORDER — SERTRALINE HCL 50 MG PO TABS
50.0000 mg | ORAL_TABLET | Freq: Every day | ORAL | Status: DC
  Administered 2024-07-12 – 2024-07-14 (×3): 50 mg via ORAL
  Filled 2024-07-12 (×3): qty 1

## 2024-07-12 MED ORDER — POLYETHYLENE GLYCOL 3350 17 G PO PACK: 17.0000 g | PACK | Freq: Every day | ORAL | Status: DC | PRN

## 2024-07-12 NOTE — H&P (Addendum)
 History and Physical  Laurie Mcfarland FMW:[id number] DOB: [birth year] DOA: 07/11/2024  Referring physician: Dr. Melvenia, EDP  PCP: Carlette Benita Area, MD  Outpatient Specialists: None. Patient coming from: SNF.  Chief Complaint: Fall  HPI: Laurie Mcfarland is a 89 y.o. female with medical history significant for dementia, hypertension, hyperlipidemia, chronic anxiety/depression, prediabetes, polyneuropathy, who presents to the ER after an unwitnessed fall at her long-term care facility.  The patient is unable to provide a history given her underlying dementia.  To note, the patient had a ground-level fall with a scalp laceration which required staples in the ER, 3 days ago.  The patient currently has no complaints.  In the ER, febrile with Tmax 101.8.  UA positive for pyuria.  Noncontrast head CT revealed trace intra ventricular hemorrhage in the left lateral ventricle occipital horn, unchanged.  Otherwise, no acute intracranial abnormality.  CT cervical spine showed no acute abnormality of the cervical spine.  The patient received IV Rocephin  for presumed UTI and 1 L IV LR bolus x 1.  Admitted by [facility], hospitalist service.  ED Course: Temperature 98.1.  BP 137/51, pulse 73, respiratory rate 15, O2 saturation 96% on room air.  Review of Systems: Review of systems as noted in the HPI. All other systems reviewed and are negative.   Past Medical History:  Diagnosis Date   Anxiety    Arthritis    Cataracts, bilateral    Dementia without behavioral disturbance (HCC)    Depression    Diabetes mellitus    Diabetic peripheral neuropathy (HCC)    Fibromyalgia    GERD (gastroesophageal reflux disease)    Hyperlipidemia    Hypertension    Leg swelling    Shortness of breath    with exertion   Weight loss    Wheezing    Past Surgical History:  Procedure Laterality Date   ABDOMINAL HYSTERECTOMY     CATARACT EXTRACTION W/PHACO  12/10/2011   Procedure: CATARACT EXTRACTION PHACO AND INTRAOCULAR  LENS PLACEMENT (IOC);  Surgeon: Oneil T. Roz, MD;  Location: [facility];  Service: Ophthalmology;  Laterality: Left;  CDE=10.72   CATARACT EXTRACTION W/PHACO  01/14/2012   Procedure: CATARACT EXTRACTION PHACO AND INTRAOCULAR LENS PLACEMENT (IOC);  Surgeon: Oneil T. Roz, MD;  Location: [facility];  Service: Ophthalmology;  Laterality: Right;  CDE:11.63   ESOPHAGOGASTRODUODENOSCOPY N/A 07/18/2019   multiple gastric and duodenal ulcers, mild Schatzki ring in the distal esophagus, irregular Z-line, and LA grade B esophagitis, +H.pylori serologies s/p treatment   ESOPHAGOGASTRODUODENOSCOPY (EGD) WITH ESOPHAGEAL DILATION N/A 06/17/2013   MFM:Wnmfjo esophagus-status post Agapito dilation. Hiatal hernia. Gastric erosions s/p bx   ESOPHAGOGASTRODUODENOSCOPY (EGD) WITH PROPOFOL  N/A 11/25/2019   Normal esophagus, erosive gastropathy with some crpe appearance much better than previous EGD, superficial erosions/ulcerations small and scattered in the posterior duodenal bulb and second portion of the duodenum s/p biopsies of gastric and duodenal mucosa.  Pathology with peptic duodenitis with ulceration, chronic active gastritis without H. Pylori.   foreign body removal     splinter removed from right side of face   LAPAROTOMY N/A 03/22/2020   Procedure: EXPLORATORY LAPAROTOMY;  Surgeon: Mavis Oneil, MD;  Location: [facility];  Service: General;  Laterality: N/A;   LYSIS OF ADHESION N/A 03/22/2020   Procedure: LYSIS OF ADHESIONS;  Surgeon: Mavis Oneil, MD;  Location: [facility];  Service: General;  Laterality: N/A;    Social History:  reports that she quit smoking about 29 years ago. Her smoking use included cigarettes. She  has never used smokeless tobacco. She reports that she does not drink alcohol  and does not use drugs.   Allergies  Allergen Reactions   Penicillins Rash    Has patient had a PCN reaction causing immediate rash, facial/tongue/throat swelling, SOB or lightheadedness with hypotension: No Has patient  had a PCN reaction causing severe rash involving mucus membranes or skin necrosis: No Has patient had a PCN reaction that required hospitalization: No Has patient had a PCN reaction occurring within the last 10 years: No If all of the above answers are NO, then may proceed with Cephalosporin use.     Family History  Problem Relation Age of Onset   Alzheimer's disease Mother    Cancer Father    Diabetes Daughter    High blood pressure Daughter    High blood pressure Daughter    Cancer Sister    Cancer Brother    Pseudochol deficiency Neg Hx    Malignant hyperthermia Neg Hx    Hypotension Neg Hx    Anesthesia problems Neg Hx    Colon cancer Neg Hx    Stomach cancer Neg Hx       Prior to Admission medications   Medication Sig Start Date End Date Taking? Authorizing Provider  acetaminophen  (TYLENOL ) 650 MG CR tablet Take 650 mg by mouth 3 (three) times daily.    [provider]  albuterol  (VENTOLIN  HFA) 108 (90 Base) MCG/ACT inhaler Inhale 2 puffs into the lungs every 4 (four) hours as needed.  03/28/20   [provider]  amLODipine  (NORVASC ) 10 MG tablet Take by mouth. 05/19/13   [provider]  atorvastatin  (LIPITOR) 20 MG tablet Take 1 tablet (20 mg total) by mouth daily. 03/29/20   Ricky Fines, MD  Bismuth Subsalicylate (STOMACH RELIEF PO) Take 30 mLs by mouth daily as needed. Take 30 ml by mouth every 30 minutes to 1 hour as needed for diarrhea. Do Not Exceed 8 doses in 24 hours.    [provider]  gabapentin  (NEURONTIN ) 300 MG capsule Take 1 capsule (300 mg total) by mouth 3 (three) times daily. 03/28/20   Ricky Fines, MD  hydrochlorothiazide (MICROZIDE) 12.5 MG capsule Take 12.5 mg by mouth daily. 03/03/24   [provider]  loperamide  (IMODIUM ) 2 MG capsule Take 1 capsule (2 mg total) by mouth 4 (four) times daily as needed for diarrhea or loose stools. 05/20/23   Idol, Julie, PA-C  losartan  (COZAAR ) 100 MG tablet Take 100 mg by  mouth daily. 05/07/24   [provider]  memantine  (NAMENDA ) 10 MG tablet Take 10 mg by mouth 2 (two) times daily. 03/14/20   [provider]  metFORMIN  (GLUCOPHAGE ) 500 MG tablet Take 500 mg by mouth 2 (two) times daily.    [provider]  omeprazole  (PRILOSEC) 40 MG capsule Take 1 capsule (40 mg total) by mouth 2 (two) times daily. 07/20/19   Ricky Fines, MD  primidone  (MYSOLINE ) 50 MG tablet Take 25 mg by mouth at bedtime. 06/21/19   [provider]  sertraline (ZOLOFT) 50 MG tablet Take 50 mg by mouth daily. 05/07/24   [provider]    Physical Exam: BP 135/62   Pulse 87   Temp 98.1 F (36.7 C) (Oral)   Resp (!) 21   Ht 5' 2 (1.575 m)   Wt 77 kg   SpO2 94%   BMI 31.05 kg/m   General: 89 y.o. year-old female well developed well nourished in no acute distress.  Alert and confused in the setting of dementia. Cardiovascular: Regular rate and rhythm with no rubs or gallops.  No thyromegaly or JVD noted.  No lower extremity edema bilaterally. Respiratory: Clear to auscultation with no wheezes or rales.  Poor inspiratory effort. Abdomen: Soft nontender nondistended with normal bowel sounds x4 quadrants. Muskuloskeletal: No cyanosis or clubbing noted bilaterally Neuro: CN II-XII intact, strength, sensation, reflexes Skin: No ulcerative lesions noted or rashes Psychiatry: Unable to assess judgment and mood due to confusion from dementia         Labs on Admission:  Basic Metabolic Panel: Recent Labs  Lab 07/11/24 2222  NA 139  K 4.1  CL 99  CO2 28  GLUCOSE 144*  BUN 24*  CREATININE 1.28*  CALCIUM  9.6   Liver Function Tests: Recent Labs  Lab 07/11/24 2222  AST 19  ALT 13  ALKPHOS 84  BILITOT 0.3  PROT 7.6  ALBUMIN 4.3   No results for input(s): LIPASE, AMYLASE in the last 168 hours. No results for input(s): AMMONIA in the last 168 hours. CBC: Recent Labs  Lab 07/11/24 2222  WBC 9.7  NEUTROABS 7.5  HGB 11.5*   HCT 34.9*  MCV 90.6  PLT 238   Cardiac Enzymes: No results for input(s): CKTOTAL, CKMB, CKMBINDEX, TROPONINI in the last 168 hours.  BNP (last 3 results) No results for input(s): BNP in the last 8760 hours.  ProBNP (last 3 results) No results for input(s): PROBNP in the last 8760 hours.  CBG: No results for input(s): GLUCAP in the last 168 hours.  Radiological Exams on Admission: DG Pelvis 1-2 Views Result Date: 07/11/2024 EXAM: 1 VIEW(S) XRAY OF THE PELVIS 07/11/2024 10:43:28 PM COMPARISON: None available. CLINICAL HISTORY: Fall, weakness. FINDINGS: BONES AND JOINTS: No acute fracture. No focal osseous lesion. No joint dislocation. SOFT TISSUES: The soft tissues are unremarkable. IMPRESSION: 1. No significant abnormality. Electronically signed by: Pinkie Pebbles MD 07/11/2024 10:46 PM EST RP Workstation: [id number]   DG Chest Port 1 View Result Date: 07/11/2024 EXAM: 1 VIEW(S) XRAY OF THE CHEST 07/11/2024 10:43:28 PM COMPARISON: 07/03/2024 CLINICAL HISTORY: fall, weakness FINDINGS: LUNGS AND PLEURA: Stable eventration of the right hemidiaphragm. No focal pulmonary opacity. No pulmonary edema. No pleural effusion. No pneumothorax. HEART AND MEDIASTINUM: No acute abnormality of the cardiac and mediastinal silhouettes. BONES AND SOFT TISSUES: No acute osseous abnormality. IMPRESSION: 1. No acute cardiopulmonary process. Electronically signed by: Pinkie Pebbles MD 07/11/2024 10:45 PM EST RP Workstation: [id number]   CT Cervical Spine Wo Contrast Result Date: 07/11/2024 EXAM: CT CERVICAL SPINE WITHOUT CONTRAST 07/11/2024 10:36:22 PM TECHNIQUE: CT of the cervical spine was performed without the administration of intravenous contrast. Multiplanar reformatted images are provided for review. Automated exposure control, iterative reconstruction, and/or weight based adjustment of the mA/kV was utilized to reduce the radiation dose to as low as reasonably achievable. COMPARISON:  07/08/2024 CLINICAL HISTORY: Ataxia, cervical trauma. FINDINGS: CERVICAL SPINE: BONES AND ALIGNMENT: 3 mm anterolisthesis of C3 on C4. No acute fracture. DEGENERATIVE CHANGES: Mild degenerative changes of the mid cervical spine. SOFT TISSUES: No prevertebral soft tissue swelling. IMPRESSION: 1. No acute abnormality of the cervical spine. Electronically signed by: Pinkie Pebbles MD 07/11/2024 10:41 PM EST RP Workstation: [id number]   CT Head Wo Contrast Result Date: 07/11/2024 EXAM: CT HEAD WITHOUT CONTRAST 07/11/2024 10:36:22 PM TECHNIQUE: CT of the head was performed without the administration of intravenous contrast. Automated exposure control, iterative reconstruction, and/or weight based adjustment of the mA/kV was utilized to reduce the radiation dose  to as low as reasonably achievable. COMPARISON: 07/08/2024 CLINICAL HISTORY: Head trauma, minor (Age >= 65y) FINDINGS: BRAIN AND VENTRICLES: Trace layering intraventricular hemorrhage in the occipital horn of the left lateral ventricle (image 40), similar to recent prior. Global cortical atrophy. Subcortical and periventricular small vessel ischemic changes. Intracranial atherosclerosis. No evidence of acute infarct. No hydrocephalus. No extra-axial collection. No mass effect or midline shift. ORBITS: No acute abnormality. SINUSES: No acute abnormality. SOFT TISSUES AND SKULL: Bilateral parietal skin staples. No skull fracture. IMPRESSION: 1. Trace intraventricular hemorrhage in the left lateral ventricle occipital horn, unchanged. 2. Otherwise, no acute intracranial abnormality. Electronically signed by: Pinkie Pebbles MD 07/11/2024 10:40 PM EST RP Workstation: [id number]    EKG: I independently viewed the EKG done and my findings are as followed: Sinus rhythm rate of 89.  Nonspecific ST changes.  QTc 408.  Assessment/Plan Present on Admission:  Fall  Principal Problem:   Fall  Fall, unwitnessed, POA Recent fall, 3 days ago PT OT  assessment Fall precautions  Presumed UTI, POA UA positive for pyuria, febrile with Tmax 101.8 Follow urine culture Continue Rocephin  for now Monitor fever curve  Chronic anxiety/depression Resume home Zoloft.  Hypertension BPs are currently soft Resume oral antihypertensives when blood pressure allows Closely monitor vital signs.  Trace intra ventricular hemorrhage in the left lateral ventricle occipital horn, unchanged.   Seen on noncontrast head CT scan.    Time: 75 minutes.    DVT prophylaxis: SCDs to prevent intraventricular bleeding.  Code Status: Full code, by default.  No family members available.  Family Communication: None at bedside.  Disposition Plan: Admitted to telemetry unit.  Consults called: None.  Admission status: Inpatient status.   Status is: Inpatient The patient requires at least 2 midnights for further evaluation and treatment of present condition.   Terry LOISE Hurst MD [facility] Pager [phone]  If 7PM-7AM, please contact night-coverage [email or web address] Password [email or web address]  07/12/2024, 3:14 AM

## 2024-07-12 NOTE — Plan of Care (Signed)
  Problem: Acute Rehab OT Goals (only OT should resolve) Goal: Pt. Will Perform Eating Flowsheets (Taken 07/12/2024 1156) Pt Will Perform Eating:  with set-up  sitting Goal: Pt. Will Perform Grooming Flowsheets (Taken 07/12/2024 1156) Pt Will Perform Grooming:  with min assist  sitting Goal: Pt. Will Perform Upper Body Bathing Flowsheets (Taken 07/12/2024 1156) Pt Will Perform Upper Body Bathing:  with mod assist  sitting Goal: Pt. Will Perform Upper Body Dressing Flowsheets (Taken 07/12/2024 1156) Pt Will Perform Upper Body Dressing:  with min assist  sitting  with mod assist Goal: Pt. Will Perform Lower Body Dressing Flowsheets (Taken 07/12/2024 1156) Pt Will Perform Lower Body Dressing:  with mod assist  with adaptive equipment  bed level Goal: Pt. Will Transfer To Toilet Flowsheets (Taken 07/12/2024 1156) Pt Will Transfer to Toilet:  with mod assist  stand pivot transfer Goal: Pt. Will Perform Toileting-Clothing Manipulation Flowsheets (Taken 07/12/2024 1156) Pt Will Perform Toileting - Clothing Manipulation and hygiene:  with mod assist  bed level Goal: Pt/Caregiver Will Perform Home Exercise Program Flowsheets (Taken 07/12/2024 1156) Pt/caregiver will Perform Home Exercise Program:  Increased ROM  Increased strength  Right Upper extremity  Left upper extremity  With minimal assist  Lucynda Rosano OT, MOT

## 2024-07-12 NOTE — Progress Notes (Signed)
 [facility] PROGRESS NOTE  Laurie Mcfarland (DOB: 1935/05/27) FMW:[id number] PCP: Carlette Benita Area, MD  Brief Narrative: Laurie Mcfarland is an 89 y.o. female with a history of dementia, HTN, HLD, anxiety/depression, prediabetes, polyneuropathy who presented to the ED on 07/11/2024 after an unwitnessed fall at [facility]. She also fell there with scalp laceration that was stapled in ED 3 days prior. CT head revealed unchanged trace intraventricular hemorrhage, and CT cervical spine was nonacute as well. Rectal fever was noted in the ED and UA demonstrated many bacteria, and pyuria, so ceftriaxone  was given, urine culture sent, and PT/OT evaluations requested. She was admitted this morning.   Subjective: Confused but interactive, denies pain anywhere.   Objective: BP (!) 131/57   Pulse 73   Temp 97.9 F (36.6 C) (Oral)   Resp 17   Ht 5' 2 (1.575 m)   Wt 77 kg   SpO2 96%   BMI 31.05 kg/m   Gen: No distress, elderly, pleasantly confused Pulm: Nonlabored, clear  CV: RRR, edema GI: Soft, NT, ND, +BS  Neuro: Alert and disoriented, not able to follow all commands but no apparent focal deficits Ext: Warm, no deformities.   Assessment & Plan: Unwitnessed fall: With negative trauma CT's.  - PT/OT - Treat possibly contributing UTI - Note will need staple removal from scalp later this week (from prior fall)  UTI: WBC 11k, Tmax 101.96F.   - Continue empiric CTX and monitoring urine culture  Otherwise, per H&P this AM by Dr. Shona.   Bernardino KATHEE Come, MD [facility] [email or web address] 07/12/2024, 11:03 AM

## 2024-07-12 NOTE — Evaluation (Signed)
 Occupational Therapy Evaluation Patient Details Name: Laurie Mcfarland MRN: [id number] DOB: 04-10-1935 Today's Date: 07/12/2024   History of Present Illness   Laurie Mcfarland is a 89 y.o. female with medical history significant for dementia, hypertension, hyperlipidemia, chronic anxiety/depression, prediabetes, polyneuropathy, who presents to the ER after an unwitnessed fall at her long-term care facility.  The patient is unable to provide a history given her underlying dementia.  To note, the patient had a ground-level fall with a scalp laceration which required staples in the ER, 3 days ago.  The patient currently has no complaints. (per MD)     Clinical Impressions Pt agreeable to OT and PT co-evaluation. Pt has dementia at baseline and struggled to provide history. Most of history taken from chart review. Pt required max A for bed mobility and transfer to chair without AD. R UE significant limited in functional use. L UE mildly limited in A/ROM of shoulder flexion. Pt needed total assist for lower body tasks based on observation. Max A likely needed for most upper body ADL tasks as well. Pt left in the chair with call bell within reach. Pt will benefit from continued OT in the hospital to increase strength, balance, and endurance for safe ADL's.        If plan is discharge home, recommend the following:   A lot of help with walking and/or transfers;A lot of help with bathing/dressing/bathroom;Assistance with cooking/housework;Assistance with feeding;Direct supervision/assist for medications management;Assist for transportation;Help with stairs or ramp for entrance     Functional Status Assessment   Patient has had a recent decline in their functional status and demonstrates the ability to make significant improvements in function in a reasonable and predictable amount of time.     Equipment Recommendations   None recommended by OT     Recommendations for Other Services          Precautions/Restrictions   Precautions Precautions: Fall Recall of Precautions/Restrictions: Impaired Restrictions Weight Bearing Restrictions Per Provider Order: No     Mobility Bed Mobility Overal bed mobility: Needs Assistance Bed Mobility: Supine to Sit     Supine to sit: Max assist, HOB elevated     General bed mobility comments: much assist needed; poor trunk control    Transfers Overall transfer level: Needs assistance Equipment used: Rolling walker (2 wheels), 1 person hand held assist Transfers: Sit to/from Stand, Bed to chair/wheelchair/BSC Sit to Stand: Max assist Stand pivot transfers: Max assist         General transfer comment: EOB to chair without AD; blocking knees      Balance Overall balance assessment: Needs assistance Sitting-balance support: Feet supported, Single extremity supported Sitting balance-Leahy Scale: Poor Sitting balance - Comments: seated at EOB Postural control: Left lateral lean Standing balance support: During functional activity, Single extremity supported (Pushing on chair arm initially then holding to therapist.) Standing balance-Leahy Scale: Poor Standing balance comment: without AD                           ADL either performed or assessed with clinical judgement   ADL Overall ADL's : Needs assistance/impaired Eating/Feeding: Maximal assistance;Total assistance   Grooming: Maximal assistance;Total assistance   Upper Body Bathing: Maximal assistance   Lower Body Bathing: Total assistance;Bed level   Upper Body Dressing : Maximal assistance   Lower Body Dressing: Total assistance;Bed level   Toilet Transfer: Maximal assistance;Total assistance;Stand-pivot Toilet Transfer Details (indicate cue type and reason): EOB to  chair without AD Toileting- Clothing Manipulation and Hygiene: Total assistance;Bed level               Vision Baseline Vision/History:  (Unsure of visual baseline) Vision  Assessment?: No apparent visual deficits (Unsure of basleine vision. No obvious impairments noted today.)     Perception Perception: Not tested       Praxis Praxis: Not tested       Pertinent Vitals/Pain Pain Assessment Pain Assessment: Faces Faces Pain Scale: Hurts little more Pain Location: L LE Pain Descriptors / Indicators: Grimacing, Sharp Pain Intervention(s): Limited activity within patient's tolerance, Repositioned, Monitored during session     Extremity/Trunk Assessment Upper Extremity Assessment Upper Extremity Assessment: RUE deficits/detail;LUE deficits/detail RUE Deficits / Details: Significanly limited with high tone and little to no active movement of R UE. Able to grasp with difficulty opening. Pt presents as if R side is limited from a prior stroke RUE Coordination: decreased fine motor;decreased gross motor LUE Deficits / Details: 3-/5 shoulder flexion; generally weak; near full P/ROm for shoulder flexion.   Lower Extremity Assessment Lower Extremity Assessment: Defer to PT evaluation   Cervical / Trunk Assessment Cervical / Trunk Assessment: Kyphotic   Communication Communication Communication: No apparent difficulties   Cognition Arousal: Alert Behavior During Therapy: WFL for tasks assessed/performed, Anxious Cognition: No family/caregiver present to determine baseline, Cognition impaired, History of cognitive impairments   Orientation impairments: Place, Time, Situation         OT - Cognition Comments: Pt knew name only and struggled to recall prior living history.                 Following commands: Impaired Following commands impaired: Follows one step commands inconsistently     Cueing  General Comments   Cueing Techniques: Verbal cues;Tactile cues                 Home Living Family/patient expects to be discharged to:: Assisted living                             Home Equipment: Rolling Walker (2  wheels);Wheelchair - manual;Cane - single point;Shower seat          Prior Functioning/Environment Prior Level of Function : Needs assist;Patient poor historian/Family not available       Physical Assist : Mobility (physical);ADLs (physical) Mobility (physical): Bed mobility;Transfers ADLs (physical): Feeding;Grooming;Bathing;Dressing;Toileting;IADLs Mobility Comments: assisted for transfers, short distances using RW, uses wheelchair mostly (per chart) ADLs Comments: Assisted by ALF staff (per chart)    OT Problem List: Decreased strength;Decreased range of motion;Decreased activity tolerance;Impaired balance (sitting and/or standing);Decreased coordination;Decreased cognition;Decreased knowledge of use of DME or AE;Pain   OT Treatment/Interventions: Self-care/ADL training;Therapeutic exercise;Therapeutic activities;Patient/family education;Balance training;DME and/or AE instruction;Cognitive remediation/compensation;Visual/perceptual remediation/compensation      OT Goals(Current goals can be found in the care plan section)   Acute Rehab OT Goals Patient Stated Goal: improve function OT Goal Formulation: With patient Time For Goal Achievement: 07/26/24 Potential to Achieve Goals: Fair   OT Frequency:  Min 2X/week    Co-evaluation PT/OT/SLP Co-Evaluation/Treatment: Yes Reason for Co-Treatment: To address functional/ADL transfers   OT goals addressed during session: ADL's and self-care                       End of Session Equipment Utilized During Treatment: Gait belt  Activity Tolerance: Patient tolerated treatment well Patient left: in chair;with call bell/phone within reach  OT Visit Diagnosis: Unsteadiness on feet (R26.81);Other abnormalities of gait and mobility (R26.89);Muscle weakness (generalized) (M62.81);History of falling (Z91.81);Other symptoms and signs involving cognitive function                Time: 0955-1010 OT Time Calculation (min): 15  min Charges:  OT General Charges $OT Visit: 1 Visit OT Evaluation $OT Eval Low Complexity: 1 Low  Jamieson Lisa OT, MOT  Jayson Person 07/12/2024, 11:53 AM

## 2024-07-12 NOTE — Plan of Care (Signed)
  Problem: Acute Rehab PT Goals(only PT should resolve) Goal: Pt Will Go Sit To Supine/Side Outcome: Progressing Flowsheets (Taken 07/12/2024 1333) Pt will go Sit to Supine/Side: with minimal assist Goal: Patient Will Perform Sitting Balance Outcome: Progressing Flowsheets (Taken 07/12/2024 1333) Patient will perform sitting balance: with minimal assist Goal: Patient Will Transfer Sit To/From Stand Outcome: Progressing Flowsheets (Taken 07/12/2024 1333) Patient will transfer sit to/from stand: with minimal assist Goal: Pt Will Transfer Bed To Chair/Chair To Bed Outcome: Progressing Flowsheets (Taken 07/12/2024 1333) Pt will Transfer Bed to Chair/Chair to Bed:  with min assist  with mod assist Goal: Pt Will Perform Standing Balance Or Pre-Gait Outcome: Progressing Flowsheets (Taken 07/12/2024 1333) Pt will perform standing balance or pre-gait: with minimal assist Goal: Pt Will Ambulate Outcome: Progressing Flowsheets (Taken 07/12/2024 1333) Pt will Ambulate:  with minimal assist  with rolling walker  with moderate assist  Jyll Tomaro, SPT

## 2024-07-12 NOTE — TOC Initial Note (Signed)
 Transition of Care [facility]) - Initial/Assessment Note    Patient Details  Name: Laurie Mcfarland MRN: [id number] Date of Birth: 26-Mar-1935  [facility]) CM/SW Contact:    Noreen KATHEE Cleotilde ISRAEL Phone Number: 07/12/2024, 2:35 PM  Clinical Narrative:                  CSW spoke with patient Laurie Mcfarland at [employer]. CSW updated Laurie Mcfarland on patient status by reading MD note, since she expressed calling the nursing station numerous times and no response. Plans are for patient to return back to [facility] to complete her Rehab. Laurie Mcfarland stated that she was unsure if patient would be able to return back to [facility] after rehab. CSW informed Laurie that [facility] does not have any LT beds , but was unsure depending on when patient finishes her rehab and it changes. Laurie Mcfarland has been started. CSW will continue to follow.     Expected Discharge Plan: Skilled Nursing Facility Barriers to Discharge: Continued Medical Work up   Patient Goals and CMS Choice Patient states their goals for this hospitalization and ongoing recovery are:: return back to [facility] CMS Medicare.gov Compare Post Acute Care list provided to:: Patient Represenative (must comment) Choice offered to / list presented to : [facility] POA / Guardian [place] ownership interest in [facility].provided to:: [facility] POA / Guardian    Expected Discharge Plan and Services       Living arrangements for the past 2 months: Assisted Living Facility                                      Prior Living Arrangements/Services Living arrangements for the past 2 months: Assisted Living Facility Lives with:: Facility Resident Patient language and need for interpreter reviewed:: Yes Do you feel safe going back to the place where you live?: Yes      Need for Family Participation in Patient Care: No (Comment) Care giver support system in place?: Yes (comment)   Criminal Activity/Legal Involvement Pertinent to Current Situation/Hospitalization: No - Comment  as needed  Activities of Daily Living   ADL Screening (condition at time of admission) Independently performs ADLs?: No Does the patient have a NEW difficulty with bathing/dressing/toileting/self-feeding that is expected to last >3 days?: No Does the patient have a NEW difficulty with getting in/out of bed, walking, or climbing stairs that is expected to last >3 days?: No Does the patient have a NEW difficulty with communication that is expected to last >3 days?: No Is the patient deaf or have difficulty hearing?: No Does the patient have difficulty seeing, even when wearing glasses/contacts?: No Does the patient have difficulty concentrating, remembering, or making decisions?: Yes  Permission Sought/Granted      Share Information with NAME: Laurie Mcfarland     Permission granted to share info w Relationship: Laurie     Emotional Assessment Appearance:: Appears stated age Attitude/Demeanor/Rapport: Unable to Assess Affect (typically observed): Unable to Assess Orientation: : Oriented to Self Alcohol  / Substance Use: Not Applicable Psych Involvement: No (comment)  Admission diagnosis:  Fall [W19.XXXA] Generalized weakness [R53.1] Fever in adult [R50.9] Fall, initial encounter [W19.XXXA] Urinary tract infection without hematuria, site unspecified [N39.0] Patient Active Problem List   Diagnosis Date Noted   Fall 07/12/2024   Abdominal pain 05/05/2020   Bilateral lower extremity edema 04/07/2020   Hyperlipidemia associated with type 2 diabetes mellitus (HCC) 03/29/2020  Hypertension associated with type 2 diabetes mellitus (HCC) 03/29/2020   CKD stage 3 due to type 2 diabetes mellitus (HCC) 03/29/2020   Essential tremor 03/29/2020   Major depression, recurrent, chronic 03/29/2020   Aortic atherosclerosis 03/29/2020   Chronic bilateral low back pain without sciatica 03/29/2020   Dementia with behavioral disturbance (HCC)    Small bowel obstruction (HCC) 03/19/2020   History of  Helicobacter pylori infection 08/19/2019   Multiple gastric ulcers 08/19/2019   Multiple duodenal ulcers 08/19/2019   Abnormal weight loss 08/19/2019   PUD (peptic ulcer disease)    Acute blood loss anemia 07/17/2019   Fibromyalgia 07/17/2019   Bilateral pulmonary embolism (HCC) 02/16/2018   Diabetic peripheral neuropathy (HCC) 02/16/2018   Syncope 06/29/2016   Renal insufficiency 06/29/2016   Anemia 06/29/2016   Edema    Mild dementia (HCC) 03/01/2015   Gait instability 03/01/2015   Frequent falls 03/01/2015   Memory loss 09/03/2014   HTN (hypertension) 09/03/2014   Hyperlipidemia 09/03/2014   Diabetes type 2, controlled (HCC) 09/03/2014   Confusion 07/13/2014   Lower urinary tract infectious disease 07/13/2014   Acute encephalopathy 07/13/2014   Fall at home 07/13/2014   DM type 2 (diabetes mellitus, type 2) (HCC) 07/13/2014   Esophageal dysphagia 06/09/2013   GERD (gastroesophageal reflux disease) 06/09/2013   Early satiety 06/09/2013   PCP:  Carlette Benita Area, MD Pharmacy:   [facility] [place] - [address] [place] [address] Phone: [phone] Fax: [phone]     Social Drivers of Health (SDOH) Social History: SDOH Screenings   Food Insecurity: No Food Insecurity (07/12/2024)  Housing: Low Risk  (07/12/2024)  Transportation Needs: No Transportation Needs (07/12/2024)  Utilities: Not At Risk (07/12/2024)  Financial Resource Strain: Low Risk  (07/17/2019)  Physical Activity: Inactive (07/17/2019)  Social Connections: Moderately Isolated (07/12/2024)  Stress: Stress Concern Present (07/17/2019)  Tobacco Use: Medium Risk (07/11/2024)   SDOH Interventions:     Readmission Risk Interventions    07/12/2024    2:31 PM  Readmission Risk Prevention Plan  Transportation Screening Complete  Home Care Screening Complete  Medication Review (RN CM) Complete

## 2024-07-12 NOTE — Plan of Care (Signed)
  Problem: Clinical Measurements: Goal: Ability to maintain clinical measurements within normal limits will improve Outcome: Progressing Goal: Will remain free from infection Outcome: Progressing Goal: Diagnostic test results will improve Outcome: Progressing Goal: Respiratory complications will improve Outcome: Progressing Goal: Cardiovascular complication will be avoided Outcome: Progressing   Problem: Education: Goal: Knowledge of General Education information will improve Description: Including pain rating scale, medication(s)/side effects and non-pharmacologic comfort measures Outcome: Not Progressing

## 2024-07-12 NOTE — ED Notes (Signed)
 Transport contacted, on their way.

## 2024-07-12 NOTE — ED Notes (Signed)
 Daughter Pam called to check in patient.

## 2024-07-12 NOTE — Evaluation (Signed)
 Physical Therapy Evaluation Patient Details Name: Laurie Mcfarland MRN: [id number] DOB: December 06, 1934 Today's Date: 07/12/2024  History of Present Illness  Laurie Mcfarland is a 89 y.o. female with medical history significant for dementia, hypertension, hyperlipidemia, chronic anxiety/depression, prediabetes, polyneuropathy, who presents to the ER after an unwitnessed fall at her long-term care facility.  The patient is unable to provide a history given her underlying dementia.  To note, the patient had a ground-level fall with a scalp laceration which required staples in the ER, 3 days ago.  The patient currently has no complaints.    Clinical Impression  Pt. Presented with general weakness, and LLE tone w/ PROM and pain. Pt was had difficulty understanding initial commands, with treatment. Pt required visual and tactile cuing. Pt required Max A for bed mobility, and Max w/o for transfer from bed to chair w/ stand pivot. Pt. Was left in chair w/ call bell. Nursing staff was notified on pt status. Patient will benefit from continued skilled physical therapy in hospital and recommended venue below to increase strength, balance, endurance for safe ADLs and gait.       If plan is discharge home, recommend the following: A lot of help with walking and/or transfers;A lot of help with bathing/dressing/bathroom;Assistance with feeding;Help with stairs or ramp for entrance;Supervision due to cognitive status   Can travel by private vehicle   Yes    Equipment Recommendations None recommended by PT  Recommendations for Other Services       Functional Status Assessment Patient has had a recent decline in their functional status and demonstrates the ability to make significant improvements in function in a reasonable and predictable amount of time.     Precautions / Restrictions Precautions Precautions: Fall Recall of Precautions/Restrictions: Impaired Restrictions Weight Bearing Restrictions Per Provider  Order: No      Mobility  Bed Mobility Overal bed mobility: Needs Assistance Bed Mobility: Supine to Sit     Supine to sit: Max assist, HOB elevated     General bed mobility comments: much assist needed; poor trunk control    Transfers Overall transfer level: Needs assistance Equipment used: Rolling walker (2 wheels), 1 person hand held assist Transfers: Sit to/from Stand, Bed to chair/wheelchair/BSC Sit to Stand: Max assist Stand pivot transfers: Max assist Step pivot transfers: Mod assist, Max assist       General transfer comment: EOB to chair without AD; blocking knees    Ambulation/Gait               General Gait Details: N/A  Stairs            Wheelchair Mobility     Tilt Bed    Modified Rankin (Stroke Patients Only)       Balance Overall balance assessment: Needs assistance Sitting-balance support: Feet supported, Single extremity supported Sitting balance-Leahy Scale: Poor Sitting balance - Comments: seated at EOB Postural control: Left lateral lean Standing balance support: During functional activity, Single extremity supported Standing balance-Leahy Scale: Poor Standing balance comment: without AD                             Pertinent Vitals/Pain Pain Assessment Pain Assessment: No/denies pain Faces Pain Scale: Hurts little more Pain Location: L LE Pain Descriptors / Indicators: Grimacing, Sharp, Discomfort Pain Intervention(s): Limited activity within patient's tolerance, Monitored during session, Repositioned    Home Living Family/patient expects to be discharged to:: Assisted living  Home Equipment: Agricultural Consultant (2 wheels);Wheelchair - manual;Cane - single point;Shower seat Additional Comments: Youth Worker    Prior Function Prior Level of Function : Needs assist;Patient poor historian/Family not available       Physical Assist : Mobility (physical);ADLs (physical) Mobility (physical):  Bed mobility;Transfers ADLs (physical): Feeding;Grooming;Bathing;Dressing;Toileting;IADLs Mobility Comments: assisted for transfers, short distances using RW, uses wheelchair mostly ADLs Comments: Assisted by ALF staff     Extremity/Trunk Assessment   Upper Extremity Assessment Upper Extremity Assessment: Defer to OT evaluation RUE Deficits / Details: Significanly limited with high tone and little to no active movement of R UE. Able to grasp with difficulty opening. Pt presents as if R side is limited from a prior stroke RUE Coordination: decreased fine motor;decreased gross motor LUE Deficits / Details: 3-/5 shoulder flexion; generally weak; near full P/ROm for shoulder flexion.    Lower Extremity Assessment Lower Extremity Assessment: Generalized weakness;LLE deficits/detail LLE Deficits / Details: LLE stiffness and tone during PROM. Unable to fully flex knee during bed mobility nor transfer from bed to chair. LLE Sensation: WNL LLE Coordination: decreased gross motor    Cervical / Trunk Assessment Cervical / Trunk Assessment: Kyphotic  Communication   Communication Communication: No apparent difficulties    Cognition Arousal: Alert Behavior During Therapy: WFL for tasks assessed/performed, Anxious   PT - Cognitive impairments: History of cognitive impairments                         Following commands: Impaired Following commands impaired: Follows one step commands inconsistently     Cueing Cueing Techniques: Verbal cues, Tactile cues     General Comments      Exercises     Assessment/Plan    PT Assessment Patient needs continued PT services  PT Problem List Decreased strength;Decreased activity tolerance;Decreased range of motion;Decreased balance;Decreased mobility;Decreased safety awareness;Decreased knowledge of use of DME       PT Treatment Interventions DME instruction;Gait training;Stair training;Functional mobility training;Therapeutic  activities;Therapeutic exercise;Balance training;Patient/family education    PT Goals (Current goals can be found in the Care Plan section)  Acute Rehab PT Goals Patient Stated Goal: return home PT Goal Formulation: With patient Time For Goal Achievement: 07/24/24 Potential to Achieve Goals: Good    Frequency Min 2X/week     Co-evaluation PT/OT/SLP Co-Evaluation/Treatment: Yes Reason for Co-Treatment: To address functional/ADL transfers PT goals addressed during session: Mobility/safety with mobility OT goals addressed during session: ADL's and self-care       AM-PAC PT 6 Clicks Mobility  Outcome Measure Help needed turning from your back to your side while in a flat bed without using bedrails?: A Lot Help needed moving from lying on your back to sitting on the side of a flat bed without using bedrails?: A Lot Help needed moving to and from a bed to a chair (including a wheelchair)?: A Lot Help needed standing up from a chair using your arms (e.g., wheelchair or bedside chair)?: A Lot Help needed to walk in hospital room?: A Lot Help needed climbing 3-5 steps with a railing? : Total 6 Click Score: 11    End of Session Equipment Utilized During Treatment: Gait belt Activity Tolerance: Patient tolerated treatment well;Patient limited by fatigue Patient left: in chair;with call bell/phone within reach Nurse Communication: Mobility status PT Visit Diagnosis: Unsteadiness on feet (R26.81);Repeated falls (R29.6);Other abnormalities of gait and mobility (R26.89);Muscle weakness (generalized) (M62.81)    Time: 9045-8988 PT Time Calculation (min) (ACUTE ONLY): 17 min  Charges:   PT Evaluation $PT Eval Low Complexity: 1 Low PT Treatments $Therapeutic Activity: 8-22 mins PT General Charges $$ ACUTE PT VISIT: 1 Visit         Ivery Cable, SPT

## 2024-07-12 NOTE — ED Notes (Signed)
 Pt ate 40% of breakfast tray being hand fed in small bites.

## 2024-07-13 LAB — BASIC METABOLIC PANEL WITH GFR
Anion gap: 16 — ABNORMAL HIGH (ref 5–15)
BUN: 22 mg/dL (ref 8–23)
CO2: 23 mmol/L (ref 22–32)
Calcium: 9.6 mg/dL (ref 8.9–10.3)
Chloride: 100 mmol/L (ref 98–111)
Creatinine, Ser: 0.93 mg/dL (ref 0.44–1.00)
GFR, Estimated: 59 mL/min — ABNORMAL LOW (ref >60)
Glucose, Bld: 103 mg/dL — ABNORMAL HIGH (ref 70–99)
Potassium: 4 mmol/L (ref 3.5–5.1)
Sodium: 139 mmol/L (ref 135–145)

## 2024-07-13 LAB — CBC
HCT: 34.3 % — ABNORMAL LOW (ref 36.0–46.0)
Hemoglobin: 11 g/dL — ABNORMAL LOW (ref 12.0–15.0)
MCH: 29.6 pg (ref 26.0–34.0)
MCHC: 32.1 g/dL (ref 30.0–36.0)
MCV: 92.2 fL (ref 80.0–100.0)
Platelets: 224 K/uL (ref 150–400)
RBC: 3.72 MIL/uL — ABNORMAL LOW (ref 3.87–5.11)
RDW: 14 % (ref 11.5–15.5)
WBC: 8.2 K/uL (ref 4.0–10.5)
nRBC: 0 % (ref 0.0–0.2)

## 2024-07-13 MED ORDER — AMLODIPINE BESYLATE 5 MG PO TABS
10.0000 mg | ORAL_TABLET | Freq: Every day | ORAL | Status: DC
  Administered 2024-07-13 – 2024-07-14 (×2): 10 mg via ORAL
  Filled 2024-07-13 (×2): qty 2

## 2024-07-13 MED ORDER — ATORVASTATIN CALCIUM 20 MG PO TABS
20.0000 mg | ORAL_TABLET | Freq: Every day | ORAL | Status: DC
  Administered 2024-07-13 – 2024-07-14 (×2): 20 mg via ORAL
  Filled 2024-07-13 (×2): qty 1

## 2024-07-13 MED ORDER — PANTOPRAZOLE SODIUM 40 MG PO TBEC
40.0000 mg | DELAYED_RELEASE_TABLET | Freq: Every day | ORAL | Status: DC
  Administered 2024-07-13 – 2024-07-14 (×2): 40 mg via ORAL
  Filled 2024-07-13 (×2): qty 1

## 2024-07-13 MED ORDER — ALBUTEROL SULFATE HFA 108 (90 BASE) MCG/ACT IN AERS: 2.0000 | INHALATION_SPRAY | RESPIRATORY_TRACT | Status: DC | PRN

## 2024-07-13 MED ORDER — PRIMIDONE 50 MG PO TABS
25.0000 mg | ORAL_TABLET | Freq: Every day | ORAL | Status: DC
  Administered 2024-07-13: 25 mg via ORAL
  Filled 2024-07-13: qty 1

## 2024-07-13 MED ORDER — ALBUTEROL SULFATE (2.5 MG/3ML) 0.083% IN NEBU: 2.5000 mg | INHALATION_SOLUTION | RESPIRATORY_TRACT | Status: DC | PRN

## 2024-07-13 NOTE — Plan of Care (Signed)

## 2024-07-13 NOTE — Progress Notes (Signed)
 [facility] PROGRESS NOTE   CARMACK (DOB: [birth year]) FMW:[id number] PCP: Carlette Benita Area, MD  Brief Narrative: Laurie Mcfarland is an 89 y.o. female with a history of dementia, HTN, HLD, anxiety/depression, prediabetes, polyneuropathy who presented to the ED on 07/11/2024 after an unwitnessed fall at [facility]. She also fell there with scalp laceration that was stapled in ED 3 days prior. CT head revealed unchanged trace intraventricular hemorrhage, and CT cervical spine was nonacute as well. Rectal fever was noted in the ED and UA demonstrated many bacteria, and pyuria, so ceftriaxone  was given, urine culture sent, and PT/OT evaluations requested. She was admitted this morning.   Subjective: Confused but interactive, no new complaints.   Objective: BP (!) 165/57 (BP Location: Right Arm)   Pulse 77   Temp 98.3 F (36.8 C) (Oral)   Resp 16   Ht 5' 2 (1.575 m)   Wt 52.7 kg   SpO2 94%   BMI 21.25 kg/m   Gen: Elderly female in no distress Pulm: Clear, nonlabored  CV: RRR, no MRG GI: Soft, NT, ND, +BS  Neuro: Alert and disoriented. No new focal deficits. Ext: Warm, no deformities. Skin: No new rashes, lesions or ulcers on visualized skin   Assessment & Plan: Unwitnessed fall: With negative trauma CT's.  - PT/OT > Pt will require SNF rehabilitation, TOC consulted and working diligently on this.  - Treat possibly contributing UTI - Note will need staple removal from scalp later this week (from prior fall)  E. coli UTI: WBC 11k, Tmax 101.11F. Leukocytosis and fever have resolved. - Continue empiric CTX while still hospitalized, switch to keflex empirically if she gets a bed available. Will monitor urine culture.   HTN:  - Continue ARB, restart norvasc  with rising BP   Depression, dementia: Quiescent.  - continue SSRI, namenda , primidone  qHS  Trace intra ventricular hemorrhage in the left lateral ventricle occipital horn, unchanged.   Seen on noncontrast head CT scan. -  SCDs for VTE ppx.   GERD:  - Continue PPI  Stage IIIb CKD: Based on available creatinine values.  - Avoid nephrotoxins.   Controlled NIDT2DM:  - Hold metformin . Glucose on labs have been at goal. No SSI planned.   HLD:  - Continue statin   Bernardino KATHEE Come, MD [facility] [email or web address] 07/13/2024, 10:59 AM

## 2024-07-14 DIAGNOSIS — S0101XD Laceration without foreign body of scalp, subsequent encounter: Secondary | ICD-10-CM

## 2024-07-14 LAB — URINE CULTURE: Culture: >=100000 — AB

## 2024-07-14 MED ORDER — CEPHALEXIN 500 MG PO CAPS: 500.0000 mg | ORAL_CAPSULE | Freq: Two times a day (BID) | ORAL | Status: AC

## 2024-07-14 NOTE — Discharge Summary (Signed)
 Physician Discharge Summary   Patient: Laurie Mcfarland MRN: [id number] DOB: June 18, 1935  Admit date:     07/11/2024  Discharge date: 07/14/24  Discharge Physician: Carliss LELON Canales   PCP: Carlette Benita Area, MD   Recommendations at discharge:    Pt to be discharged to SNF.   If you experience worsening fever, chills, chest pain, shortness of breath, or other concerning symptoms, please call your PCP or go to the emergency department immediately.  Discharge Diagnoses: Principal Problem:   Fall  Resolved Problems:   * No resolved hospital problems. *   Hospital Course:  Laurie Mcfarland is an 89 y.o. female with a history of dementia, HTN, HLD, anxiety/depression, prediabetes, polyneuropathy who presented to the ED on 07/11/2024 after an unwitnessed fall at [facility]. She also fell there with scalp laceration that was stapled in ED 3 days prior. CT head revealed unchanged trace intraventricular hemorrhage, and CT cervical spine was nonacute as well. Rectal fever was noted in the ED and UA demonstrated many bacteria, and pyuria, so ceftriaxone  was given, urine culture sent, and PT/OT evaluations requested. She was admitted this morning.   Assessment and Plan:  Unwitnessed fall - Negative trauma CTs.  Evaluated by PT/OT, recommending SNF.  Likely exacerbated by underlying UTI.  Urinary tract infection - UA noting nitrites, LE, WBCs, bacteria presentation.  Urine culture noting pansensitive E. coli (intermediate resistance to Cipro ).  Responded well to empiric ceftriaxone , leukocytosis resolved.  Will transition patient to p.o. Keflex to take as directed upon discharge.  Scalp laceration (POA) - Occurred 3 days prior to admission, was stapled in ED at that time.  Appears well-healing.  Recommend evaluation 11/14 Friday to remove staples.  Dementia - Continue previous home medication regiment.  Trace intraventricular hemorrhage in the left lateral ventricle occipital horn - Unchanged.  Noted on  repeat head CT.  GERD - PPI on board.  CKD 3B - Appears stable.  Diabetes mellitus - Appears well-controlled.  Resume previous regiment.   Consultants: None Procedures performed: None Disposition: Skilled nursing facility Diet recommendation:  Discharge Diet Orders (From admission, onward)     Start     Ordered   07/14/24 0000  Diet - low sodium heart healthy        07/14/24 1116           Cardiac and Carb modified diet  DISCHARGE MEDICATION: Allergies as of 07/14/2024       Reactions   Penicillins Rash   Has patient had a PCN reaction causing immediate rash, facial/tongue/throat swelling, SOB or lightheadedness with hypotension: No Has patient had a PCN reaction causing severe rash involving mucus membranes or skin necrosis: No Has patient had a PCN reaction that required hospitalization: No Has patient had a PCN reaction occurring within the last 10 years: No If all of the above answers are NO, then may proceed with Cephalosporin use.        Medication List     TAKE these medications    acetaminophen  650 MG CR tablet Commonly known as: TYLENOL  Take 650 mg by mouth 3 (three) times daily.   albuterol  108 (90 Base) MCG/ACT inhaler Commonly known as: VENTOLIN  HFA Inhale 2 puffs into the lungs every 4 (four) hours as needed.   amLODipine  10 MG tablet Commonly known as: NORVASC  Take 10 mg by mouth daily.   atorvastatin  20 MG tablet Commonly known as: LIPITOR Take 1 tablet (20 mg total) by mouth daily.   cephALEXin 500 MG capsule  Commonly known as: KEFLEX Take 1 capsule (500 mg total) by mouth 2 (two) times daily.   gabapentin  300 MG capsule Commonly known as: NEURONTIN  Take 1 capsule (300 mg total) by mouth 3 (three) times daily.   hydrochlorothiazide 12.5 MG capsule Commonly known as: MICROZIDE Take 12.5 mg by mouth daily.   losartan  100 MG tablet Commonly known as: COZAAR  Take 100 mg by mouth daily.   memantine  10 MG tablet Commonly  known as: NAMENDA  Take 10 mg by mouth 2 (two) times daily.   metFORMIN  500 MG tablet Commonly known as: GLUCOPHAGE  Take 500 mg by mouth 2 (two) times daily.   omeprazole  40 MG capsule Commonly known as: PRILOSEC Take 1 capsule (40 mg total) by mouth 2 (two) times daily.   primidone  50 MG tablet Commonly known as: MYSOLINE  Take 25 mg by mouth at bedtime.   sertraline 50 MG tablet Commonly known as: ZOLOFT Take 50 mg by mouth daily.   STOMACH RELIEF PO Take 30 mLs by mouth daily as needed. Take 30 ml by mouth every 30 minutes to 1 hour as needed for diarrhea. Do Not Exceed 8 doses in 24 hours.               Discharge Care Instructions  (From admission, onward)           Start     Ordered   07/14/24 0000  Discharge wound care:       Comments: Evaluate for staple removal Friday 11/14   07/14/24 1116             Discharge Exam: Filed Weights   07/11/24 2201 07/12/24 1123  Weight: 77 kg 52.7 kg    GENERAL:  Alert, pleasant, no acute distress, frail HEENT:  EOMI CARDIOVASCULAR:  RRR, no murmurs appreciated RESPIRATORY:  Clear to auscultation, no wheezing, rales, or rhonchi GASTROINTESTINAL:  Soft, nontender, nondistended EXTREMITIES:  No LE edema bilaterally NEURO:  No new focal deficits appreciated SKIN:  No rashes noted PSYCH:  Appropriate mood and affect     Condition at discharge: improving  The results of significant diagnostics from this hospitalization (including imaging, microbiology, ancillary and laboratory) are listed below for reference.   Imaging Studies: DG Pelvis 1-2 Views Result Date: 07/11/2024 EXAM: 1 VIEW(S) XRAY OF THE PELVIS 07/11/2024 10:43:28 PM COMPARISON: None available. CLINICAL HISTORY: Fall, weakness. FINDINGS: BONES AND JOINTS: No acute fracture. No focal osseous lesion. No joint dislocation. SOFT TISSUES: The soft tissues are unremarkable. IMPRESSION: 1. No significant abnormality. Electronically signed by: Pinkie Pebbles MD 07/11/2024 10:46 PM EST RP Workstation: [id number]   DG Chest Port 1 View Result Date: 07/11/2024 EXAM: 1 VIEW(S) XRAY OF THE CHEST 07/11/2024 10:43:28 PM COMPARISON: 07/03/2024 CLINICAL HISTORY: fall, weakness FINDINGS: LUNGS AND PLEURA: Stable eventration of the right hemidiaphragm. No focal pulmonary opacity. No pulmonary edema. No pleural effusion. No pneumothorax. HEART AND MEDIASTINUM: No acute abnormality of the cardiac and mediastinal silhouettes. BONES AND SOFT TISSUES: No acute osseous abnormality. IMPRESSION: 1. No acute cardiopulmonary process. Electronically signed by: Pinkie Pebbles MD 07/11/2024 10:45 PM EST RP Workstation: [id number]   CT Cervical Spine Wo Contrast Result Date: 07/11/2024 EXAM: CT CERVICAL SPINE WITHOUT CONTRAST 07/11/2024 10:36:22 PM TECHNIQUE: CT of the cervical spine was performed without the administration of intravenous contrast. Multiplanar reformatted images are provided for review. Automated exposure control, iterative reconstruction, and/or weight based adjustment of the mA/kV was utilized to reduce the radiation dose to as low as reasonably achievable. COMPARISON: 07/08/2024 CLINICAL  HISTORY: Ataxia, cervical trauma. FINDINGS: CERVICAL SPINE: BONES AND ALIGNMENT: 3 mm anterolisthesis of C3 on C4. No acute fracture. DEGENERATIVE CHANGES: Mild degenerative changes of the mid cervical spine. SOFT TISSUES: No prevertebral soft tissue swelling. IMPRESSION: 1. No acute abnormality of the cervical spine. Electronically signed by: Pinkie Pebbles MD 07/11/2024 10:41 PM EST RP Workstation: [id number]   CT Head Wo Contrast Result Date: 07/11/2024 EXAM: CT HEAD WITHOUT CONTRAST 07/11/2024 10:36:22 PM TECHNIQUE: CT of the head was performed without the administration of intravenous contrast. Automated exposure control, iterative reconstruction, and/or weight based adjustment of the mA/kV was utilized to reduce the radiation dose to as low as reasonably  achievable. COMPARISON: 07/08/2024 CLINICAL HISTORY: Head trauma, minor (Age >= 65y) FINDINGS: BRAIN AND VENTRICLES: Trace layering intraventricular hemorrhage in the occipital horn of the left lateral ventricle (image 40), similar to recent prior. Global cortical atrophy. Subcortical and periventricular small vessel ischemic changes. Intracranial atherosclerosis. No evidence of acute infarct. No hydrocephalus. No extra-axial collection. No mass effect or midline shift. ORBITS: No acute abnormality. SINUSES: No acute abnormality. SOFT TISSUES AND SKULL: Bilateral parietal skin staples. No skull fracture. IMPRESSION: 1. Trace intraventricular hemorrhage in the left lateral ventricle occipital horn, unchanged. 2. Otherwise, no acute intracranial abnormality. Electronically signed by: Pinkie Pebbles MD 07/11/2024 10:40 PM EST RP Workstation: [id number]   CT Head Wo Contrast Result Date: 07/08/2024 EXAM: CT HEAD AND CERVICAL SPINE WITHOUT CONTRAST 07/08/2024 08:28:06 AM TECHNIQUE: CT of the head and cervical spine was performed without the administration of intravenous contrast. Automated exposure control, iterative reconstruction, and/or weight based adjustment of the mA/kV was utilized to reduce the radiation dose to as low as reasonably achievable. COMPARISON: 07/03/2024 CLINICAL HISTORY: Polytrauma, blunt; Right posterior head trauma. FINDINGS: BRAIN AND VENTRICLES: Hypoattenuating foci in the cerebral white matter, most likely representing chronic small vessel disease. Prominence of the sulci and ventricles compatible with brain atrophy. Decreased layering intraventricular hemorrhage within the left occipital horn (axial image 44/2). No new areas of hemorrhage. No evidence of acute infarct. No hydrocephalus. No extra-axial collection. No mass effect or midline shift. ORBITS: No acute abnormality. SINUSES: Partial opacification of the sphenoid sinus, unchanged. SOFT TISSUES AND SKULL: New skin staples overlying  the left posterior calvarium corresponding to previous scalp laceration. No skull fracture. CERVICAL SPINE: No acute posttraumatic malalignment of the cervical spine. No signs of cervical spine fracture or subluxation. . Unchanged appearance of 1st degree anterolisthesis of C3 on C4 and C7 on T1. Multilevel disc space narrowing and endplate spurring are noted, compatible with spondylosis. Bilateral facet arthropathy. Straightening of normal cervical lordosis may reflect muscle spasm or patient positioning. IMPRESSION: 1. No acute intracranial abnormality related to trauma. 2. Decreased layering intraventricular hemorrhage within the left occipital horn, with no new hemorrhage. 3. No acute cervical spine fracture or malalignment. 4. Multilevel cervical spondylosis Electronically signed by: Waddell Calk MD 07/08/2024 08:41 AM EST RP Workstation: [id number]   CT Cervical Spine Wo Contrast Result Date: 07/08/2024 EXAM: CT HEAD AND CERVICAL SPINE WITHOUT CONTRAST 07/08/2024 08:28:06 AM TECHNIQUE: CT of the head and cervical spine was performed without the administration of intravenous contrast. Automated exposure control, iterative reconstruction, and/or weight based adjustment of the mA/kV was utilized to reduce the radiation dose to as low as reasonably achievable. COMPARISON: 07/03/2024 CLINICAL HISTORY: Polytrauma, blunt; Right posterior head trauma. FINDINGS: BRAIN AND VENTRICLES: Hypoattenuating foci in the cerebral white matter, most likely representing chronic small vessel disease. Prominence of the sulci and ventricles compatible with brain atrophy.  Decreased layering intraventricular hemorrhage within the left occipital horn (axial image 44/2). No new areas of hemorrhage. No evidence of acute infarct. No hydrocephalus. No extra-axial collection. No mass effect or midline shift. ORBITS: No acute abnormality. SINUSES: Partial opacification of the sphenoid sinus, unchanged. SOFT TISSUES AND SKULL: New skin  staples overlying the left posterior calvarium corresponding to previous scalp laceration. No skull fracture. CERVICAL SPINE: No acute posttraumatic malalignment of the cervical spine. No signs of cervical spine fracture or subluxation. . Unchanged appearance of 1st degree anterolisthesis of C3 on C4 and C7 on T1. Multilevel disc space narrowing and endplate spurring are noted, compatible with spondylosis. Bilateral facet arthropathy. Straightening of normal cervical lordosis may reflect muscle spasm or patient positioning. IMPRESSION: 1. No acute intracranial abnormality related to trauma. 2. Decreased layering intraventricular hemorrhage within the left occipital horn, with no new hemorrhage. 3. No acute cervical spine fracture or malalignment. 4. Multilevel cervical spondylosis Electronically signed by: Waddell Calk MD 07/08/2024 08:41 AM EST RP Workstation: [id number]   DG Chest Port 1 View Result Date: 07/03/2024 CLINICAL DATA:  Fall. EXAM: PORTABLE CHEST 1 VIEW COMPARISON:  07/25/2023 FINDINGS: The heart size and mediastinal contours are within normal limits. Stable elevation of right hemidiaphragm. Both lungs are clear. The visualized skeletal structures are unremarkable. IMPRESSION: No active disease. Electronically Signed   By: Norleen DELENA Kil M.D.   On: 07/03/2024 09:35   DG Hip Unilat W or Wo Pelvis 2-3 Views Right Result Date: 07/03/2024 CLINICAL DATA:  Fall.  Right hip pain. EXAM: DG HIP (WITH OR WITHOUT PELVIS) 3V RIGHT COMPARISON:  None Available. FINDINGS: There is no evidence of hip fracture or dislocation. Mild right hip osteoarthritis noted. Advanced lower lumbar spine degenerative changes also seen. IMPRESSION: No acute findings. Mild right hip osteoarthritis. Electronically Signed   By: Norleen DELENA Kil M.D.   On: 07/03/2024 09:34   CT Head Wo Contrast Addendum Date: 07/03/2024 * ADDENDUM #1 ** ADDENDUM: The above findings were discussed with Dr. Charlyn at 9:04 AM 07/03/2024.  ---------------------------------------------------- Electronically signed by: Evalene Coho MD 07/03/2024 09:07 AM EDT RP Workstation: [id number]   Result Date: 07/03/2024 ** ORIGINAL REPORT ** EXAM: CT HEAD WITHOUT CONTRAST 07/03/2024 08:28:56 AM TECHNIQUE: CT of the head was performed without the administration of intravenous contrast. Automated exposure control, iterative reconstruction, and/or weight based adjustment of the mA/kV was utilized to reduce the radiation dose to as low as reasonably achievable. COMPARISON: 06/07/2024 CLINICAL HISTORY: Head trauma, minor (Age >= 65y) FINDINGS: BRAIN AND VENTRICLES: Trace layering intraventricular hemorrhage in left occipital horn. No evidence of acute infarct. Cerebral volume loss with prominent sulci and ventricles. Scattered periventricular and subcortical white matter low-density changes compatible with chronic microvascular ischemic change. No extra-axial collection. No mass effect or midline shift. ORBITS: Bilateral lens replacement noted. SINUSES: Moderate opacification of the sphenoid sinus. SOFT TISSUES AND SKULL: Debris in right external auditory canal. Calcified atherosclerotic plaque in cavernous/supraclinoid ICA and intradural vertebral arteries. No acute soft tissue abnormality. No skull fracture. Traumatic brain injury risk stratification: Skull fracture: No (low - mbig 1) Subdural hematoma (sdh): No (low) Subarachnoid hemorrhage (sah): No Epidural hematoma (edh): No (low - mbig 1) Cerebral contusion, intra-axial, intraparenchymal hemorrhage (iph): No Intraventricular hemorrhage (ivh): Yes (high - mbig 3) Midline shift > 1mm or edema/effacement of sulci/vents: No (low - mbig 1) IMPRESSION: 1. Trace layering intraventricular hemorrhage in the left occipital horn. 2. Cerebral volume loss with prominent sulci and ventricles. 3. Scattered periventricular and subcortical white matter low-density  changes compatible with chronic microvascular ischemic  change. 4. Moderate opacification of the sphenoid sinus and debris in the right external auditory canal. Electronically signed by: Evalene Coho MD 07/03/2024 08:59 AM EDT RP Workstation: [id number]   CT Cervical Spine Wo Contrast Result Date: 07/03/2024 EXAM: CT CERVICAL SPINE WITHOUT CONTRAST 07/03/2024 08:28:56 AM TECHNIQUE: CT of the cervical spine was performed without the administration of intravenous contrast. Multiplanar reformatted images are provided for review. Automated exposure control, iterative reconstruction, and/or weight based adjustment of the mA/kV was utilized to reduce the radiation dose to as low as reasonably achievable. COMPARISON: CT of the cervical spine dated 06/07/2024. CLINICAL HISTORY: Neck trauma (Age >= 65y). FINDINGS: CERVICAL SPINE: BONES AND ALIGNMENT: The patient's head is tilted to the left. There is grade 1 degenerative anterolisthesis at C3-C4 with moderate left facet arthrosis. There is mild degenerative anterolisthesis at C7-T1. No acute fracture or traumatic malalignment. DEGENERATIVE CHANGES: There is moderate chronic degenerative disc disease at C4-C5, C5-C6 and C6-C7. Moderate left facet arthrosis at C3-C4. Grade 1 degenerative anterolisthesis at C3-C4. Mild degenerative anterolisthesis at C7-T1. SOFT TISSUES: No prevertebral soft tissue swelling. IMPRESSION: 1. No acute abnormality of the cervical spine related to the reported neck trauma. 2. Grade 1 degenerative anterolisthesis at C3-4 with moderate left facet arthrosis. 3. Moderate chronic degenerative disc disease at C4-5, C5-6, and C6-7. 4. Mild degenerative anterolisthesis at C7-T1. Electronically signed by: Evalene Coho MD 07/03/2024 09:01 AM EDT RP Workstation: [id number]    Microbiology: Results for orders placed or performed during the hospital encounter of 07/11/24  Urine Culture     Status: Abnormal   Collection Time: 07/11/24 10:50 PM   Specimen: Urine, Clean Catch  Result Value Ref Range  Status   Specimen Description   Final    URINE, CLEAN CATCH Performed at [facility], [address], [place], [place] [address]    Special Requests   Final    NONE Performed at [facility], [address], [place], [place] [address]    Culture >=100,000 COLONIES/mL ESCHERICHIA COLI (A)  Final   Report Status 07/14/2024 FINAL  Final   Organism ID, Bacteria ESCHERICHIA COLI (A)  Final      Susceptibility   Escherichia coli - MIC*    AMPICILLIN <=2 SENSITIVE Sensitive     CEFAZOLIN (URINE) Value in next row Sensitive      <=1 SENSITIVEThis is a modified FDA-approved test that has been validated and its performance characteristics determined by the reporting laboratory.  This laboratory is certified under the Clinical Laboratory Improvement Amendments CLIA as qualified to perform high complexity clinical laboratory testing.    CEFEPIME Value in next row Sensitive      <=1 SENSITIVEThis is a modified FDA-approved test that has been validated and its performance characteristics determined by the reporting laboratory.  This laboratory is certified under the Clinical Laboratory Improvement Amendments CLIA as qualified to perform high complexity clinical laboratory testing.    ERTAPENEM Value in next row Sensitive      <=1 SENSITIVEThis is a modified FDA-approved test that has been validated and its performance characteristics determined by the reporting laboratory.  This laboratory is certified under the Clinical Laboratory Improvement Amendments CLIA as qualified to perform high complexity clinical laboratory testing.    CEFTRIAXONE  Value in next row Sensitive      <=1 SENSITIVEThis is a modified FDA-approved test that has been validated and its performance characteristics determined by the reporting laboratory.  This laboratory is certified under the Clinical  Laboratory Improvement Amendments CLIA as qualified to perform high complexity clinical laboratory testing.    CIPROFLOXACIN  Value in next  row Intermediate      <=1 SENSITIVEThis is a modified FDA-approved test that has been validated and its performance characteristics determined by the reporting laboratory.  This laboratory is certified under the Clinical Laboratory Improvement Amendments CLIA as qualified to perform high complexity clinical laboratory testing.    GENTAMICIN Value in next row Sensitive      <=1 SENSITIVEThis is a modified FDA-approved test that has been validated and its performance characteristics determined by the reporting laboratory.  This laboratory is certified under the Clinical Laboratory Improvement Amendments CLIA as qualified to perform high complexity clinical laboratory testing.    NITROFURANTOIN Value in next row Sensitive      <=1 SENSITIVEThis is a modified FDA-approved test that has been validated and its performance characteristics determined by the reporting laboratory.  This laboratory is certified under the Clinical Laboratory Improvement Amendments CLIA as qualified to perform high complexity clinical laboratory testing.    TRIMETH/SULFA Value in next row Sensitive      <=1 SENSITIVEThis is a modified FDA-approved test that has been validated and its performance characteristics determined by the reporting laboratory.  This laboratory is certified under the Clinical Laboratory Improvement Amendments CLIA as qualified to perform high complexity clinical laboratory testing.    AMPICILLIN/SULBACTAM Value in next row Sensitive      <=1 SENSITIVEThis is a modified FDA-approved test that has been validated and its performance characteristics determined by the reporting laboratory.  This laboratory is certified under the Clinical Laboratory Improvement Amendments CLIA as qualified to perform high complexity clinical laboratory testing.    PIP/TAZO Value in next row Sensitive      <=4 SENSITIVEThis is a modified FDA-approved test that has been validated and its performance characteristics determined by the  reporting laboratory.  This laboratory is certified under the Clinical Laboratory Improvement Amendments CLIA as qualified to perform high complexity clinical laboratory testing.    MEROPENEM Value in next row Sensitive      <=4 SENSITIVEThis is a modified FDA-approved test that has been validated and its performance characteristics determined by the reporting laboratory.  This laboratory is certified under the Clinical Laboratory Improvement Amendments CLIA as qualified to perform high complexity clinical laboratory testing.    * >=100,000 COLONIES/mL ESCHERICHIA COLI  Resp panel by RT-PCR (RSV, Flu A&B, Covid) Anterior Nasal Swab     Status: None   Collection Time: 07/11/24 10:50 PM   Specimen: Anterior Nasal Swab  Result Value Ref Range Status   SARS Coronavirus 2 by RT PCR NEGATIVE NEGATIVE Final    Comment: (NOTE) SARS-CoV-2 target nucleic acids are NOT DETECTED.  The SARS-CoV-2 RNA is generally detectable in upper respiratory specimens during the acute phase of infection. The lowest concentration of SARS-CoV-2 viral copies this assay can detect is 138 copies/mL. A negative result does not preclude SARS-Cov-2 infection and should not be used as the sole basis for treatment or other patient management decisions. A negative result may occur with  improper specimen collection/handling, submission of specimen other than nasopharyngeal swab, presence of viral mutation(s) within the areas targeted by this assay, and inadequate number of viral copies(<138 copies/mL). A negative result must be combined with clinical observations, patient history, and epidemiological information. The expected result is Negative.  Fact Sheet for Patients:  bloggercourse.com  Fact Sheet for Healthcare Providers:  seriousbroker.it  This test is no t yet approved  or cleared by the [place]  FDA and  has been authorized for detection and/or diagnosis of  SARS-CoV-2 by FDA under an Emergency Use Authorization (EUA). This EUA will remain  in effect (meaning this test can be used) for the duration of the COVID-19 declaration under Section 564(b)(1) of the Act, 21 U.S.C.section 360bbb-3(b)(1), unless the authorization is terminated  or revoked sooner.       Influenza A by PCR NEGATIVE NEGATIVE Final   Influenza B by PCR NEGATIVE NEGATIVE Final    Comment: (NOTE) The Xpert Xpress SARS-CoV-2/FLU/RSV plus assay is intended as an aid in the diagnosis of influenza from Nasopharyngeal swab specimens and should not be used as a sole basis for treatment. Nasal washings and aspirates are unacceptable for Xpert Xpress SARS-CoV-2/FLU/RSV testing.  Fact Sheet for Patients: bloggercourse.com  Fact Sheet for Healthcare Providers: seriousbroker.it  This test is not yet approved or cleared by the [place] States  FDA and has been authorized for detection and/or diagnosis of SARS-CoV-2 by FDA under an Emergency Use Authorization (EUA). This EUA will remain in effect (meaning this test can be used) for the duration of the COVID-19 declaration under Section 564(b)(1) of the Act, 21 U.S.C. section 360bbb-3(b)(1), unless the authorization is terminated or revoked.     Resp Syncytial Virus by PCR NEGATIVE NEGATIVE Final    Comment: (NOTE) Fact Sheet for Patients: bloggercourse.com  Fact Sheet for Healthcare Providers: seriousbroker.it  This test is not yet approved or cleared by the [place]  FDA and has been authorized for detection and/or diagnosis of SARS-CoV-2 by FDA under an Emergency Use Authorization (EUA). This EUA will remain in effect (meaning this test can be used) for the duration of the COVID-19 declaration under Section 564(b)(1) of the Act, 21 U.S.C. section 360bbb-3(b)(1), unless the authorization is terminated  or revoked.  Performed at [facility], [address], [place], [place] [address]     Labs: CBC: Recent Labs  Lab 07/11/24 2222 07/12/24 0416 07/13/24 0741  WBC 9.7 11.0* 8.2  NEUTROABS 7.5  --   --   HGB 11.5* 11.3* 11.0*  HCT 34.9* 34.3* 34.3*  MCV 90.6 91.7 92.2  PLT 238 184 224   Basic Metabolic Panel: Recent Labs  Lab 07/11/24 2222 07/12/24 0416 07/13/24 0741  NA 139 138 139  K 4.1 4.3 4.0  CL 99 99 100  CO2 28 26 23   GLUCOSE 144* 107* 103*  BUN 24* 22 22  CREATININE 1.28* 1.07* 0.93  CALCIUM  9.6 9.7 9.6  MG  --  2.0  --   PHOS  --  3.3  --    Liver Function Tests: Recent Labs  Lab 07/11/24 2222  AST 19  ALT 13  ALKPHOS 84  BILITOT 0.3  PROT 7.6  ALBUMIN 4.3   CBG: No results for input(s): GLUCAP in the last 168 hours.  Discharge time spent: 25 minutes.  Length of inpatient stay: 2 days  Signed: Carliss LELON Canales, DO [facility] 07/14/2024

## 2024-07-14 NOTE — TOC Transition Note (Signed)
 Transition of Care [facility]) - Discharge Note   Patient Details  Name: Laurie Mcfarland MRN: [id number] Date of Birth: [birth year]  [facility] CM/SW Contact:  Lucie Lunger, LCSWA Phone Number: 07/14/2024, 12:38 PM  Clinical Narrative:    CSW updated that pt is medically stable for return to [facility]. CSW spoke to Debbie in admissions who confirms pt can return after 1pm today. CSW sent D/C clinicals to facility via HUB. CSW spoke with pts daughter Holley to provide updates. CSW provided RN with room and report numbers. Med necessity to be printed to floor for RN. EMS to be called for transport. TOC signing off.   Final next level of care: Skilled Nursing Facility Barriers to Discharge: Barriers Resolved   Patient Goals and CMS Choice Patient states their goals for this hospitalization and ongoing recovery are:: return to [facility] CMS Medicare.gov Compare Post Acute Care list provided to:: Patient Represenative (must comment) Choice offered to / list presented to : [facility] POA / Guardian [place] ownership interest in [facility].provided to:: [facility] POA / Guardian    Discharge Placement                Patient to be transferred to facility by: EMS Name of family member notified: Daughter Pam Patient and family notified of of transfer: 07/14/24  Discharge Plan and Services Additional resources added to the After Visit Summary for                                       Social Drivers of Health (SDOH) Interventions SDOH Screenings   Food Insecurity: No Food Insecurity (07/12/2024)  Housing: Low Risk  (07/12/2024)  Transportation Needs: No Transportation Needs (07/12/2024)  Utilities: Not At Risk (07/12/2024)  Financial Resource Strain: Low Risk  (07/17/2019)  Physical Activity: Inactive (07/17/2019)  Social Connections: Moderately Isolated (07/12/2024)  Stress: Stress Concern Present (07/17/2019)  Tobacco Use: Medium Risk (07/11/2024)     Readmission  Risk Interventions    07/14/2024   12:37 PM 07/12/2024    2:31 PM  Readmission Risk Prevention Plan  Transportation Screening Complete Complete  Home Care Screening Complete Complete  Medication Review (RN CM) Complete Complete

## 2024-07-14 NOTE — Plan of Care (Signed)

## 2024-07-14 NOTE — Care Management Important Message (Signed)
 Important Message  Patient Details  Name: Laurie Mcfarland MRN: [id number] Date of Birth: 03-18-1935   Important Message Given:  N/A - LOS <3 / Initial given by admissions     Charmane Protzman L Seve Monette 07/14/2024, 12:03 PM

## 2024-09-02 DEATH — deceased
